# Patient Record
Sex: Male | Born: 1960 | Race: White | Hispanic: No | Marital: Married | Smoking: Never smoker
Health system: Southern US, Community
[De-identification: ages and names within clinical notes are randomized; demographics above are authoritative.]

## PROBLEM LIST (undated history)

## (undated) ENCOUNTER — Inpatient Hospital Stay: Admission: EM | Payer: Self-pay | Source: Home / Self Care

## (undated) DIAGNOSIS — K7469 Other cirrhosis of liver: Secondary | ICD-10-CM

## (undated) DIAGNOSIS — E119 Type 2 diabetes mellitus without complications: Secondary | ICD-10-CM

## (undated) DIAGNOSIS — R161 Splenomegaly, not elsewhere classified: Secondary | ICD-10-CM

## (undated) DIAGNOSIS — H40033 Anatomical narrow angle, bilateral: Secondary | ICD-10-CM

## (undated) DIAGNOSIS — D696 Thrombocytopenia, unspecified: Secondary | ICD-10-CM

## (undated) DIAGNOSIS — K219 Gastro-esophageal reflux disease without esophagitis: Secondary | ICD-10-CM

## (undated) DIAGNOSIS — K900 Celiac disease: Secondary | ICD-10-CM

## (undated) DIAGNOSIS — E785 Hyperlipidemia, unspecified: Secondary | ICD-10-CM

## (undated) DIAGNOSIS — J45909 Unspecified asthma, uncomplicated: Secondary | ICD-10-CM

## (undated) DIAGNOSIS — T7840XA Allergy, unspecified, initial encounter: Secondary | ICD-10-CM

## (undated) DIAGNOSIS — I10 Essential (primary) hypertension: Secondary | ICD-10-CM

## (undated) DIAGNOSIS — F4321 Adjustment disorder with depressed mood: Secondary | ICD-10-CM

## (undated) DIAGNOSIS — N281 Cyst of kidney, acquired: Secondary | ICD-10-CM

## (undated) DIAGNOSIS — D649 Anemia, unspecified: Secondary | ICD-10-CM

## (undated) HISTORY — PX: BIOPSY: SHX5522

## (undated) HISTORY — DX: Celiac disease: K90.0

## (undated) HISTORY — DX: Hyperlipidemia, unspecified: E78.5

## (undated) HISTORY — PX: GOLD SEED IMPLANT: SHX6343

## (undated) HISTORY — PX: ESOPHAGOGASTRODUODENOSCOPY: SHX1529

## (undated) HISTORY — DX: Other cirrhosis of liver: K74.69

## (undated) HISTORY — DX: Type 2 diabetes mellitus without complications: E11.9

## (undated) HISTORY — PX: ESOPHAGOGASTRODUODENOSCOPY (EGD) WITH PROPOFOL: SHX5813

## (undated) HISTORY — DX: Unspecified asthma, uncomplicated: J45.909

## (undated) HISTORY — PX: CLOSED MANIPULATION SHOULDER: SUR205

## (undated) HISTORY — DX: Splenomegaly, not elsewhere classified: R16.1

## (undated) HISTORY — DX: Thrombocytopenia, unspecified: D69.6

## (undated) HISTORY — PX: COLONOSCOPY WITH PROPOFOL: SHX5780

## (undated) HISTORY — PX: COLONOSCOPY: SHX174

## (undated) HISTORY — DX: Gastro-esophageal reflux disease without esophagitis: K21.9

## (undated) HISTORY — DX: Cyst of kidney, acquired: N28.1

## (undated) HISTORY — PX: SPACE OAR INSTILLATION: SHX6769

## (undated) HISTORY — PX: ESOPHAGEAL BANDING: SHX5518

## (undated) HISTORY — DX: Allergy, unspecified, initial encounter: T78.40XA

## (undated) HISTORY — DX: Adjustment disorder with depressed mood: F43.21

## (undated) HISTORY — PX: ESOPHAGOGASTRODUODENOSCOPY: SHX5428

---

## 2005-03-02 ENCOUNTER — Other Ambulatory Visit: Admission: RE | Admit: 2005-03-02 | Discharge: 2005-03-02 | Payer: Self-pay | Admitting: Dermatology

## 2015-11-03 HISTORY — PX: COLONOSCOPY: SHX174

## 2016-03-06 ENCOUNTER — Encounter: Payer: Self-pay | Admitting: Gastroenterology

## 2016-03-25 ENCOUNTER — Ambulatory Visit: Payer: Self-pay | Admitting: Gastroenterology

## 2016-04-07 ENCOUNTER — Encounter: Payer: Self-pay | Admitting: Gastroenterology

## 2016-04-07 ENCOUNTER — Ambulatory Visit (INDEPENDENT_AMBULATORY_CARE_PROVIDER_SITE_OTHER): Payer: BLUE CROSS/BLUE SHIELD | Admitting: Gastroenterology

## 2016-04-07 ENCOUNTER — Encounter (INDEPENDENT_AMBULATORY_CARE_PROVIDER_SITE_OTHER): Payer: Self-pay

## 2016-04-07 VITALS — BP 108/62 | HR 65 | Temp 97.1°F | Ht 67.0 in | Wt 206.0 lb

## 2016-04-07 DIAGNOSIS — D696 Thrombocytopenia, unspecified: Secondary | ICD-10-CM | POA: Diagnosis not present

## 2016-04-07 DIAGNOSIS — Z86010 Personal history of colonic polyps: Secondary | ICD-10-CM

## 2016-04-07 DIAGNOSIS — K900 Celiac disease: Secondary | ICD-10-CM | POA: Insufficient documentation

## 2016-04-07 NOTE — Patient Instructions (Signed)
1. We will contact you with further recommendations once your records have been reviewed.

## 2016-04-07 NOTE — Assessment & Plan Note (Addendum)
55 year old gentleman transferring care to our practice from [facility] gastroenterology. Recently diagnosed with celiac disease per patient. Has been on gluten-free diet for 6 months with resolution of diarrhea and abdominal cramping. Notably patient lost 100 pounds over the course of the year and a half. This was unintentional. Associated with severe diarrhea. Recent colonoscopy with cecal biopsies negative for microscopic colitis, ascending colon biopsy with adenomatous colon polyps. He has a history of previous colonoscopies with adenomatous colon polyps by Dr. West Carbo who has now retired. Patient's celiac disease HLA testing revealed DQ 2 positive/DQ 8 negative. Patient was offered upper endoscopy with small bowel biopsy by Dr. Posey Pronto but patient transferred care here. In the interim he started a gluten-free diet for the past 6 weeks.  Patient needs to the celiac serologies, discussed that they may be difficult to interpret in the setting of recent initiation of gluten-free diet. May ultimately need gluten challenge to confirm diagnosis. Obtain baseline bone density study. Patient also tells me he has a history of thrombocytopenia with mild splenomegaly followed by hematology. We have requested those records. Patient denies any history of advanced liver disease.  Next colonoscopy due in January 2022 for h/o adenomatous colon polyps.

## 2016-04-07 NOTE — Progress Notes (Addendum)
Primary Care Physician:  ADDIS,DANIEL, DO  Primary Gastroenterologist:  Garfield Cornea, MD   Chief Complaint  Patient presents with  . Diarrhea    HPI:  Michael Harmon is a 55 y.o. male here At the request of Dr. Marveen Reeks for further evaluation of chronic diarrhea. Patient requests Dr. [name] because a friend of his sees Dr. [name].  Patient presents with complaints of chronic diarrhea, 100 pound weight loss. At time of his office visit I did not have his records from Dr. Serita Grit office but we have since requested and they are outlined here.  Patient had colonoscopy with Dr. Posey Pronto 11/2015. Ascending colon polyp removed which was adenomatous. Cecal biopsies negative for microscopic colitis. Patient had celiac disease HLA DQ testing and was positive for DQ 2. Celiac disease risk 2.9%  Patient states that he was so disturbed by the endoscopy set up a [facility] that he would never go back. He described it as "third world". After his colonoscopy he states he developed lung infection that required 3 rounds of antibiotics and steroids over the course of 6 months to finally cleared up.  Patient states that he has had diarrhea for nearly 2 years. Over that time frame he's lost over 100 pounds which is been unintentional. He initially contributed to stress. He was taking care of his father-in-law who had stroke and since has passed away. Also lost his mother-in-law and his mother during this time period. He also takes care of his disabled wife and works a full-time job. Typically would have 4-5 Bristol stool 7 stools daily with some nocturnal diarrhea. Utilize frequent Imodium to control symptoms. Was on metformin at one time and thought this was the cause of his diarrhea so eventually stopped it but his symptoms continued. Since he has lost 100 pounds, he has reduced his diabetes medications down to Amaryl half tablet at bedtime. He is no longer on cholesterol medications either. Diabetes  is well controlled. Denies melena or rectal bleeding. No heartburn or indigestion. Was having abdominal cramps.  Patient also has a history of mild thrombocytopenia, has been evaluated by hematology in [place]. States he has a mildly enlarged spleen. They contributed to his thrombocytopenia to Tri[employer] and/or gliperamide. Currently be monitored. States that his liver was "okay". Last labs in March 2017.  Patient states he wants to switch care to [place] because he was not satisfied with the care he received in the interval. He was seen at least 3 times in their office. States he had to wait in an exam room for over 2 hours every time and then was given no significant time by the doctor. He states that he was told he had celiac disease based on blood work and told to go home and look it up on the Internet. They did plan to do EGD with small bowel biopsy that patient decided to transfer his care here. For the past 6 weeks he has tried to maintain a gluten-free diet. Since this change she has noted remarkable improvement in his stools. Now having bowel movement twice per day, Bristol stool 4-5. Abdominal cramping has resolved.    Current Outpatient Prescriptions  Medication Sig Dispense Refill  . benazepril (LOTENSIN) 20 MG tablet     . glimepiride (AMARYL) 4 MG tablet 2 mg at bedtime.     . VENTOLIN HFA 108 (90 Base) MCG/ACT inhaler INHALE 2 PUFFS BY MOUTH EVERY 6 HOURS AS NEEDED FOR WHEEZING  0   No current facility-administered  medications for this visit.    Allergies as of 04/07/2016 - Review Complete 04/07/2016  Allergen Reaction Noted  . Penicillins Other (See Comments) 04/07/2016    Past Medical History  Diagnosis Date  . DM (diabetes mellitus) ([place])   . Hyperlipidemia     diet controlled now with 100 pound weight loss  . Asthma   . Thrombocytopenia ([place])     hematology, ?related to chol med (tricor) and glimeripide, just being monitored, above 100,000    Past Surgical History   Procedure Laterality Date  . None    . Colonoscopy  11/2015    Dr. Posey Pronto: cecal bx neg for microscopic colitis. ascending colon polyp (adenomatous)    Family History  Problem Relation Age of Onset  . Other Mother     chronic diarrhea  . Liver disease Neg Hx   . Colon cancer Neg Hx     Social History   Social History  . Marital Status: Single    Spouse Name: N/A  . Number of Children: 0  . Years of Education: N/A   Occupational History  . Customer service manager, [place], travels    Social History Main Topics  . Smoking status: Never Smoker   . Smokeless tobacco: Not on file  . Alcohol Use: 0.0 oz/week    0 Standard drinks or equivalent per week     Comment: rare, 1-2 drinks a month, on vacation  . Drug Use: No  . Sexual Activity: Not on file   Other Topics Concern  . Not on file   Social History Narrative  . No narrative on file      ROS:  General: Negative for anorexia, weight loss, fever, chills, fatigue, weakness. Eyes: Negative for vision changes.  ENT: Negative for hoarseness, difficulty swallowing , nasal congestion. CV: Negative for chest pain, angina, palpitations, dyspnea on exertion, peripheral edema.  Respiratory: Negative for dyspnea at rest, dyspnea on exertion, cough, sputum, wheezing.  GI: See history of present illness. GU:  Negative for dysuria, hematuria, urinary incontinence, urinary frequency, nocturnal urination.  MS: Negative for joint pain, low back pain.  Derm: Negative for rash or itching.  Neuro: Negative for weakness, abnormal sensation, seizure, frequent headaches, memory loss, confusion.  Psych: Negative for anxiety, depression, suicidal ideation, hallucinations.  Endo: Negative for unusual weight change.  Heme: Negative for bruising or bleeding. Allergy: Negative for rash or hives.    Physical Examination:  BP 108/62 mmHg  Pulse 65  Temp(Src) 97.1 F (36.2 C)  Ht 5' 7"  (1.702 m)  Wt 206 lb (93.441 kg)  BMI  32.26 kg/m2   General: Well-nourished, well-developed in no acute distress.  Head: Normocephalic, atraumatic.   Eyes: Conjunctiva pink, no icterus. Mouth: Oropharyngeal mucosa moist and pink , no lesions erythema or exudate. Neck: Supple without thyromegaly, masses, or lymphadenopathy.  Lungs: Clear to auscultation bilaterally.  Heart: Regular rate and rhythm, no murmurs rubs or gallops.  Abdomen: Bowel sounds are normal, nontender, nondistended, no hepatosplenomegaly or masses, no abdominal bruits or    hernia , no rebound or guarding.   Rectal: not performed Extremities: No lower extremity edema. No clubbing or deformities.  Neuro: Alert and oriented x 4 , grossly normal neurologically.  Skin: Warm and dry, no rash or jaundice.   Psych: Alert and cooperative, normal mood and affect.  Labs: Celiac HLA DQ assoc: DQ2 positive, DQ8 negative  Imaging Studies: No results found.

## 2016-04-08 NOTE — Progress Notes (Signed)
cc'ed to pcp °

## 2016-04-17 ENCOUNTER — Encounter: Payer: Self-pay | Admitting: Gastroenterology

## 2016-05-18 NOTE — Progress Notes (Signed)
Tried to call patient. Left message for return call.  I have reviewed all of his records from Dr. Serita Grit office.   Patient needs further labs done now. He will likely need baseline bone density testing but let's get blood work first. The labs Dr. Posey Pronto did on patient show that he has the genetic potential for celiac disease but does not show the disease for sure. I SUSPECT HE DOES HAVE IT SINCE HE HAS IMPROVED DRAMATICALLY ON GLUTEN FREE DIET.   We need to obtain labs now and then further recommendations to follow.

## 2016-05-18 NOTE — Progress Notes (Signed)
Reviewed records from outside source: Abdominal ultrasound March 2017 showing fatty liver, spleen slightly enlarged 17.7 cm.

## 2016-05-19 NOTE — Progress Notes (Signed)
Pt came by the office. He is aware of recommendations and will have labs drawn. I have given him the order and we went over gluten free diet and went over medications that are gluten free.

## 2016-05-20 LAB — CBC WITH DIFFERENTIAL/PLATELET
BASOS ABS: 0 {cells}/uL (ref 0–200)
Basophils Relative: 0 %
EOS ABS: 235 {cells}/uL (ref 15–500)
EOS PCT: 5 %
HCT: 38.4 % — ABNORMAL LOW (ref 38.5–50.0)
HEMOGLOBIN: 12.9 g/dL — AB (ref 13.2–17.1)
LYMPHS ABS: 1269 {cells}/uL (ref 850–3900)
Lymphocytes Relative: 27 %
MCH: 30.7 pg (ref 27.0–33.0)
MCHC: 33.6 g/dL (ref 32.0–36.0)
MCV: 91.4 fL (ref 80.0–100.0)
MONOS PCT: 9 %
MPV: 13.2 fL — ABNORMAL HIGH (ref 7.5–12.5)
Monocytes Absolute: 423 cells/uL (ref 200–950)
NEUTROS ABS: 2773 {cells}/uL (ref 1500–7800)
NEUTROS PCT: 59 %
Platelets: 127 10*3/uL — ABNORMAL LOW (ref 140–400)
RBC: 4.2 MIL/uL (ref 4.20–5.80)
RDW: 14.6 % (ref 11.0–15.0)
WBC: 4.7 10*3/uL (ref 3.8–10.8)

## 2016-05-20 LAB — IGA: IGA: 265 mg/dL (ref 81–463)

## 2016-05-20 LAB — COMPREHENSIVE METABOLIC PANEL
ALK PHOS: 157 U/L — AB (ref 40–115)
ALT: 61 U/L — AB (ref 9–46)
AST: 62 U/L — AB (ref 10–35)
Albumin: 4 g/dL (ref 3.6–5.1)
BILIRUBIN TOTAL: 0.4 mg/dL (ref 0.2–1.2)
BUN: 23 mg/dL (ref 7–25)
CO2: 19 mmol/L — AB (ref 20–31)
Calcium: 8.7 mg/dL (ref 8.6–10.3)
Chloride: 108 mmol/L (ref 98–110)
Creat: 1.07 mg/dL (ref 0.70–1.33)
GLUCOSE: 145 mg/dL — AB (ref 65–99)
Potassium: 5.7 mmol/L — ABNORMAL HIGH (ref 3.5–5.3)
Sodium: 137 mmol/L (ref 135–146)
Total Protein: 7 g/dL (ref 6.1–8.1)

## 2016-05-20 LAB — PROTIME-INR
INR: 1
PROTHROMBIN TIME: 10.6 s (ref 9.0–11.5)

## 2016-05-20 LAB — GLIADIN ANTIBODIES, SERUM
GLIADIN IGA: 146 U — AB (ref <20)
GLIADIN IGG: 154 U — AB (ref <20)

## 2016-05-20 LAB — IRON: Iron: 60 ug/dL (ref 50–180)

## 2016-05-20 LAB — FOLATE RBC: RBC FOLATE: 722 ng/mL (ref >280)

## 2016-05-20 LAB — VITAMIN D 25 HYDROXY (VIT D DEFICIENCY, FRACTURES): Vit D, 25-Hydroxy: 27 ng/mL — ABNORMAL LOW (ref 30–100)

## 2016-05-20 LAB — TISSUE TRANSGLUTAMINASE, IGA: TISSUE TRANSGLUTAMINASE AB, IGA: 100 U/mL — AB (ref <4)

## 2016-05-20 LAB — FERRITIN: FERRITIN: 41 ng/mL (ref 20–380)

## 2016-05-20 LAB — VITAMIN B12: Vitamin B-12: 426 pg/mL (ref 200–1100)

## 2016-05-21 LAB — ENDOMYSIAL AB IGA RFLX TITER: Endomysial Screen: POSITIVE — AB

## 2016-05-21 LAB — ENDOMYSIAL AB IGA TITER

## 2016-05-28 ENCOUNTER — Encounter: Payer: Self-pay | Admitting: Internal Medicine

## 2016-06-02 NOTE — Progress Notes (Signed)
Please let patient know that all of his screening labs are positive for celiac. Based on the significantly elevated numbers I suspect he may be getting some gluten exposure in his diet. He is only mildly anemic and LFTs are up some likely due to the celiac disease +/- fatty liver previously seen on u/s.  He has mild vitamin D insufficiency: PLEASE ADD VITAMIN D3 800 UNITS DAILY. He will need EGD with small biopsy for confirmation. He will need to add some gluten (3g daily) into his diet for minimum of 2 weeks prior.  Please get patient back into office to schedule EGD may use urgent so patient doesn't have to consume gluten diet for too long. Await bone density results.

## 2016-06-03 ENCOUNTER — Telehealth: Payer: Self-pay | Admitting: Internal Medicine

## 2016-06-03 NOTE — Telephone Encounter (Signed)
Pt called to say that he was returning a call to JL. I told him that she was not available at the moment. He asked for her to call his cell [phone]

## 2016-06-04 NOTE — Progress Notes (Signed)
APPOINTMENT MADE AND HE IS AWARE OF APPT DATE AND TIME

## 2016-06-04 NOTE — Telephone Encounter (Signed)
Late entry- I spoke with the pt yesterday. See result note.

## 2016-06-11 ENCOUNTER — Ambulatory Visit: Payer: BLUE CROSS/BLUE SHIELD | Admitting: Gastroenterology

## 2016-06-17 ENCOUNTER — Encounter: Payer: Self-pay | Admitting: Gastroenterology

## 2016-06-17 ENCOUNTER — Ambulatory Visit (INDEPENDENT_AMBULATORY_CARE_PROVIDER_SITE_OTHER): Payer: BLUE CROSS/BLUE SHIELD | Admitting: Gastroenterology

## 2016-06-17 ENCOUNTER — Other Ambulatory Visit: Payer: Self-pay

## 2016-06-17 VITALS — BP 108/59 | HR 67 | Temp 97.4°F | Ht 67.0 in | Wt 209.8 lb

## 2016-06-17 DIAGNOSIS — K900 Celiac disease: Secondary | ICD-10-CM

## 2016-06-17 NOTE — Progress Notes (Signed)
Primary Care Physician:  No primary care provider on file.  Primary Gastroenterologist:  Garfield Cornea, MD   Chief Complaint  Patient presents with  . EGD    bloodwork shows anemia    HPI:  Michael Harmon is a 55 y.o. male here to schedule EGD. He has positive screening labs for celiac disease. As noted previously was told he had celiac disease by Dr. Posey Pronto based on genetic profile. He had chronic Diarrhea and 100 pound weight loss which led to his diagnosis. We checked serologies which were indeed positive. Patient had already been on gluten-free diet but apparently has had some exposure in his diet given ongoing positive celiac serologies. In addition he has vitamin D deficiency (currently taking 1600IU vit d daily), abnormal LFTs, very mild normocytic anemia. H/O splenomegaly and thrombocytopenia.  Patient continues to have multiple stools daily, Bristol 5-6. No blood in the stool or melena. Denies abdominal pain. Appetite is good. Has remained on gluten-free diet however notably has been getting some exposure. Probably cross-contamination. Patient eats out a lot. Drives 3007 miles per week with his job. Notes he eats french fries several times a week and wonders if there some cross contamination in the oil.  Current Outpatient Prescriptions  Medication Sig Dispense Refill  . benazepril (LOTENSIN) 20 MG tablet Take 20 mg by mouth daily.     . cholecalciferol (VITAMIN D) 1000 units tablet Take 1,000 Units by mouth daily.    Marland Kitchen glimepiride (AMARYL) 4 MG tablet 2 mg at bedtime.     . multivitamin (ONE-A-DAY MEN'S) TABS tablet Take 1 tablet by mouth daily.     No current facility-administered medications for this visit.     Allergies as of 06/17/2016 - Review Complete 06/17/2016  Allergen Reaction Noted  . Penicillins Other (See Comments) 04/07/2016    Past Medical History:  Diagnosis Date  . Asthma   . DM (diabetes mellitus) ([place])   . Hyperlipidemia    diet controlled now with 100  pound weight loss  . Splenomegaly   . Thrombocytopenia ([place])    hematology, ?related to chol med (tricor) and glimeripide, just being monitored, above 100,000    Past Surgical History:  Procedure Laterality Date  . COLONOSCOPY  11/2015   Dr. Posey Pronto: cecal bx neg for microscopic colitis. ascending colon polyp (adenomatous)    Family History  Problem Relation Age of Onset  . Other Mother     chronic diarrhea  . Liver disease Neg Hx   . Colon cancer Neg Hx     Social History   Social History  . Marital status: Single    Spouse name: N/A  . Number of children: 0  . Years of education: N/A   Occupational History  . Customer service manager, [place], travels    Social History Main Topics  . Smoking status: Never Smoker  . Smokeless tobacco: Not on file  . Alcohol use 0.0 oz/week     Comment: rare, 1-2 drinks a month, on vacation  . Drug use: No  . Sexual activity: Not on file   Other Topics Concern  . Not on file   Social History Narrative  . No narrative on file      ROS:  General: Negative for anorexia, weight loss, fever, chills, fatigue, weakness. Eyes: Negative for vision changes.  ENT: Negative for hoarseness, difficulty swallowing , nasal congestion. CV: Negative for chest pain, angina, palpitations, dyspnea on exertion, peripheral edema.  Respiratory: Negative for dyspnea at rest, dyspnea  on exertion, cough, sputum, wheezing.  GI: See history of present illness. GU:  Negative for dysuria, hematuria, urinary incontinence, urinary frequency, nocturnal urination.  MS: Negative for joint pain, low back pain.  Derm: Negative for rash or itching.  Neuro: Negative for weakness, abnormal sensation, seizure, frequent headaches, memory loss, confusion.  Psych: Negative for anxiety, depression, suicidal ideation, hallucinations.  Endo: Negative for unusual weight change.  Heme: Negative for bruising or bleeding. Allergy: Negative for rash or hives.     Physical Examination:  BP (!) 108/59   Pulse 67   Temp 97.4 F (36.3 C) (Oral)   Ht 5' 7"  (1.702 m)   Wt 209 lb 12.8 oz (95.2 kg)   BMI 32.86 kg/m    General: Well-nourished, well-developed in no acute distress.  Head: Normocephalic, atraumatic.   Eyes: Conjunctiva pink, no icterus. Mouth: Oropharyngeal mucosa moist and pink , no lesions erythema or exudate. Neck: Supple without thyromegaly, masses, or lymphadenopathy.  Lungs: Clear to auscultation bilaterally.  Heart: Regular rate and rhythm, no murmurs rubs or gallops.  Abdomen: Bowel sounds are normal, nontender, nondistended, no hepatosplenomegaly or masses, no abdominal bruits or    hernia , no rebound or guarding.   Rectal: Not performed Extremities: No lower extremity edema. No clubbing or deformities.  Neuro: Alert and oriented x 4 , grossly normal neurologically.  Skin: Warm and dry, no rash or jaundice.   Psych: Alert and cooperative, normal mood and affect.  Labs: Lab Results  Component Value Date   WBC 4.7 05/19/2016   HGB 12.9 (L) 05/19/2016   HCT 38.4 (L) 05/19/2016   MCV 91.4 05/19/2016   PLT 127 (L) 05/19/2016   Lab Results  Component Value Date   ALT 61 (H) 05/19/2016   AST 62 (H) 05/19/2016   ALKPHOS 157 (H) 05/19/2016   BILITOT 0.4 05/19/2016   Lab Results  Component Value Date   CREATININE 1.07 05/19/2016   BUN 23 05/19/2016   NA 137 05/19/2016   K 5.7 (H) 05/19/2016   CL 108 05/19/2016   CO2 19 (L) 05/19/2016   TTG 100    Imaging Studies: No results found.

## 2016-06-17 NOTE — Assessment & Plan Note (Signed)
Celiac disease HLA testing revealed DQ2 positive/DQ8 negative. Celiac screen/serologies were positive (patient reports some gluten-free diet). Recommend EGD with small bowel biopsy with gluten challenge. Patient to consume at least 3 g gluten daily for the next 2 weeks.  When biopsies confirm celiac disease, would also recommend dietary consultation to help patient avoid cross-contamination in his diet.  Baseline bone density study planned. Continue vitamin D supplementation.

## 2016-06-17 NOTE — Patient Instructions (Signed)
1. Upper endoscopy as scheduled. Please see separate instructions. 2. Gluten challenge prior to procedure. Consume 1-2 bread products daily.

## 2016-06-17 NOTE — Progress Notes (Signed)
cc'ed to referring doctor

## 2016-06-24 ENCOUNTER — Ambulatory Visit (HOSPITAL_COMMUNITY)
Admission: RE | Admit: 2016-06-24 | Discharge: 2016-06-24 | Disposition: A | Discharge status: Home / Self Care | Payer: BLUE CROSS/BLUE SHIELD | Source: Ambulatory Visit | Attending: Gastroenterology | Admitting: Gastroenterology

## 2016-06-24 DIAGNOSIS — K900 Celiac disease: Secondary | ICD-10-CM

## 2016-06-24 DIAGNOSIS — K909 Intestinal malabsorption, unspecified: Secondary | ICD-10-CM | POA: Diagnosis present

## 2016-06-24 DIAGNOSIS — E559 Vitamin D deficiency, unspecified: Secondary | ICD-10-CM | POA: Diagnosis present

## 2016-07-01 NOTE — Progress Notes (Signed)
Bone density study normal! Await EGD findings.

## 2016-07-09 ENCOUNTER — Ambulatory Visit (HOSPITAL_COMMUNITY)
Admission: RE | Admit: 2016-07-09 | Discharge: 2016-07-09 | Disposition: A | Discharge status: Home / Self Care | Payer: BLUE CROSS/BLUE SHIELD | Source: Ambulatory Visit | Attending: Internal Medicine | Admitting: Internal Medicine

## 2016-07-09 ENCOUNTER — Encounter (HOSPITAL_COMMUNITY): Payer: Self-pay | Admitting: *Deleted

## 2016-07-09 ENCOUNTER — Encounter (HOSPITAL_COMMUNITY)
Admission: RE | Disposition: A | Discharge status: Home / Self Care | Payer: Self-pay | Source: Ambulatory Visit | Attending: Internal Medicine

## 2016-07-09 DIAGNOSIS — R7989 Other specified abnormal findings of blood chemistry: Secondary | ICD-10-CM | POA: Insufficient documentation

## 2016-07-09 DIAGNOSIS — K900 Celiac disease: Secondary | ICD-10-CM | POA: Insufficient documentation

## 2016-07-09 DIAGNOSIS — E559 Vitamin D deficiency, unspecified: Secondary | ICD-10-CM | POA: Insufficient documentation

## 2016-07-09 DIAGNOSIS — Z7984 Long term (current) use of oral hypoglycemic drugs: Secondary | ICD-10-CM | POA: Insufficient documentation

## 2016-07-09 DIAGNOSIS — Z79899 Other long term (current) drug therapy: Secondary | ICD-10-CM | POA: Diagnosis not present

## 2016-07-09 DIAGNOSIS — K2980 Duodenitis without bleeding: Secondary | ICD-10-CM | POA: Insufficient documentation

## 2016-07-09 DIAGNOSIS — R768 Other specified abnormal immunological findings in serum: Secondary | ICD-10-CM

## 2016-07-09 DIAGNOSIS — I8500 Esophageal varices without bleeding: Secondary | ICD-10-CM | POA: Insufficient documentation

## 2016-07-09 DIAGNOSIS — D649 Anemia, unspecified: Secondary | ICD-10-CM | POA: Diagnosis present

## 2016-07-09 DIAGNOSIS — E119 Type 2 diabetes mellitus without complications: Secondary | ICD-10-CM | POA: Insufficient documentation

## 2016-07-09 DIAGNOSIS — R161 Splenomegaly, not elsewhere classified: Secondary | ICD-10-CM | POA: Insufficient documentation

## 2016-07-09 DIAGNOSIS — D696 Thrombocytopenia, unspecified: Secondary | ICD-10-CM | POA: Diagnosis not present

## 2016-07-09 DIAGNOSIS — K209 Esophagitis, unspecified: Secondary | ICD-10-CM | POA: Insufficient documentation

## 2016-07-09 HISTORY — PX: ESOPHAGOGASTRODUODENOSCOPY: SHX5428

## 2016-07-09 HISTORY — PX: BIOPSY: SHX5522

## 2016-07-09 HISTORY — DX: Essential (primary) hypertension: I10

## 2016-07-09 HISTORY — DX: Anemia, unspecified: D64.9

## 2016-07-09 LAB — GLUCOSE, CAPILLARY: GLUCOSE-CAPILLARY: 99 mg/dL (ref 65–99)

## 2016-07-09 SURGERY — EGD (ESOPHAGOGASTRODUODENOSCOPY)
Anesthesia: Moderate Sedation

## 2016-07-09 MED ORDER — MIDAZOLAM HCL 5 MG/5ML IJ SOLN
INTRAMUSCULAR | Status: AC
  Filled 2016-07-09: qty 10

## 2016-07-09 MED ORDER — STERILE WATER FOR IRRIGATION IR SOLN
Status: DC | PRN
  Administered 2016-07-09: 08:00:00

## 2016-07-09 MED ORDER — SODIUM CHLORIDE 0.9 % IV SOLN
INTRAVENOUS | Status: DC
  Administered 2016-07-09: 1000 mL via INTRAVENOUS

## 2016-07-09 MED ORDER — MIDAZOLAM HCL 5 MG/5ML IJ SOLN
INTRAMUSCULAR | Status: DC | PRN
  Administered 2016-07-09: 1 mg via INTRAVENOUS
  Administered 2016-07-09 (×2): 2 mg via INTRAVENOUS
  Administered 2016-07-09: 1 mg via INTRAVENOUS

## 2016-07-09 MED ORDER — MEPERIDINE HCL 100 MG/ML IJ SOLN
INTRAMUSCULAR | Status: AC
  Filled 2016-07-09: qty 2

## 2016-07-09 MED ORDER — MEPERIDINE HCL 100 MG/ML IJ SOLN
INTRAMUSCULAR | Status: DC | PRN
  Administered 2016-07-09 (×2): 25 mg via INTRAVENOUS
  Administered 2016-07-09: 50 mg via INTRAVENOUS

## 2016-07-09 MED ORDER — ONDANSETRON HCL 4 MG/2ML IJ SOLN
INTRAMUSCULAR | Status: DC | PRN
  Administered 2016-07-09: 4 mg via INTRAVENOUS

## 2016-07-09 MED ORDER — LIDOCAINE VISCOUS 2 % MT SOLN
OROMUCOSAL | Status: DC | PRN
  Administered 2016-07-09: 3 mL via OROMUCOSAL

## 2016-07-09 MED ORDER — LIDOCAINE VISCOUS 2 % MT SOLN
OROMUCOSAL | Status: AC
  Filled 2016-07-09: qty 15

## 2016-07-09 MED ORDER — ONDANSETRON HCL 4 MG/2ML IJ SOLN
INTRAMUSCULAR | Status: AC
  Filled 2016-07-09: qty 2

## 2016-07-09 NOTE — H&P (View-Only) (Signed)
Primary Care Physician:  No primary care provider on file.  Primary Gastroenterologist:  Garfield Cornea, MD   Chief Complaint  Patient presents with  . EGD    bloodwork shows anemia    HPI:  Michael Harmon is a 55 y.o. male here to schedule EGD. He has positive screening labs for celiac disease. As noted previously was told he had celiac disease by Dr. Posey Pronto based on genetic profile. He had chronic Diarrhea and 100 pound weight loss which led to his diagnosis. We checked serologies which were indeed positive. Patient had already been on gluten-free diet but apparently has had some exposure in his diet given ongoing positive celiac serologies. In addition he has vitamin D deficiency (currently taking 1600IU vit d daily), abnormal LFTs, very mild normocytic anemia. H/O splenomegaly and thrombocytopenia.  Patient continues to have multiple stools daily, Bristol 5-6. No blood in the stool or melena. Denies abdominal pain. Appetite is good. Has remained on gluten-free diet however notably has been getting some exposure. Probably cross-contamination. Patient eats out a lot. Drives 0973 miles per week with his job. Notes he eats french fries several times a week and wonders if there some cross contamination in the oil.  Current Outpatient Prescriptions  Medication Sig Dispense Refill  . benazepril (LOTENSIN) 20 MG tablet Take 20 mg by mouth daily.     . cholecalciferol (VITAMIN D) 1000 units tablet Take 1,000 Units by mouth daily.    Marland Kitchen glimepiride (AMARYL) 4 MG tablet 2 mg at bedtime.     . multivitamin (ONE-A-DAY MEN'S) TABS tablet Take 1 tablet by mouth daily.     No current facility-administered medications for this visit.     Allergies as of 06/17/2016 - Review Complete 06/17/2016  Allergen Reaction Noted  . Penicillins Other (See Comments) 04/07/2016    Past Medical History:  Diagnosis Date  . Asthma   . DM (diabetes mellitus) ([place])   . Hyperlipidemia    diet controlled now with 100  pound weight loss  . Splenomegaly   . Thrombocytopenia ([place])    hematology, ?related to chol med (tricor) and glimeripide, just being monitored, above 100,000    Past Surgical History:  Procedure Laterality Date  . COLONOSCOPY  11/2015   Dr. Posey Pronto: cecal bx neg for microscopic colitis. ascending colon polyp (adenomatous)    Family History  Problem Relation Age of Onset  . Other Mother     chronic diarrhea  . Liver disease Neg Hx   . Colon cancer Neg Hx     Social History   Social History  . Marital status: Single    Spouse name: N/A  . Number of children: 0  . Years of education: N/A   Occupational History  . Customer service manager, [place], travels    Social History Main Topics  . Smoking status: Never Smoker  . Smokeless tobacco: Not on file  . Alcohol use 0.0 oz/week     Comment: rare, 1-2 drinks a month, on vacation  . Drug use: No  . Sexual activity: Not on file   Other Topics Concern  . Not on file   Social History Narrative  . No narrative on file      ROS:  General: Negative for anorexia, weight loss, fever, chills, fatigue, weakness. Eyes: Negative for vision changes.  ENT: Negative for hoarseness, difficulty swallowing , nasal congestion. CV: Negative for chest pain, angina, palpitations, dyspnea on exertion, peripheral edema.  Respiratory: Negative for dyspnea at rest, dyspnea  on exertion, cough, sputum, wheezing.  GI: See history of present illness. GU:  Negative for dysuria, hematuria, urinary incontinence, urinary frequency, nocturnal urination.  MS: Negative for joint pain, low back pain.  Derm: Negative for rash or itching.  Neuro: Negative for weakness, abnormal sensation, seizure, frequent headaches, memory loss, confusion.  Psych: Negative for anxiety, depression, suicidal ideation, hallucinations.  Endo: Negative for unusual weight change.  Heme: Negative for bruising or bleeding. Allergy: Negative for rash or hives.     Physical Examination:  BP (!) 108/59   Pulse 67   Temp 97.4 F (36.3 C) (Oral)   Ht 5' 7"  (1.702 m)   Wt 209 lb 12.8 oz (95.2 kg)   BMI 32.86 kg/m    General: Well-nourished, well-developed in no acute distress.  Head: Normocephalic, atraumatic.   Eyes: Conjunctiva pink, no icterus. Mouth: Oropharyngeal mucosa moist and pink , no lesions erythema or exudate. Neck: Supple without thyromegaly, masses, or lymphadenopathy.  Lungs: Clear to auscultation bilaterally.  Heart: Regular rate and rhythm, no murmurs rubs or gallops.  Abdomen: Bowel sounds are normal, nontender, nondistended, no hepatosplenomegaly or masses, no abdominal bruits or    hernia , no rebound or guarding.   Rectal: Not performed Extremities: No lower extremity edema. No clubbing or deformities.  Neuro: Alert and oriented x 4 , grossly normal neurologically.  Skin: Warm and dry, no rash or jaundice.   Psych: Alert and cooperative, normal mood and affect.  Labs: Lab Results  Component Value Date   WBC 4.7 05/19/2016   HGB 12.9 (L) 05/19/2016   HCT 38.4 (L) 05/19/2016   MCV 91.4 05/19/2016   PLT 127 (L) 05/19/2016   Lab Results  Component Value Date   ALT 61 (H) 05/19/2016   AST 62 (H) 05/19/2016   ALKPHOS 157 (H) 05/19/2016   BILITOT 0.4 05/19/2016   Lab Results  Component Value Date   CREATININE 1.07 05/19/2016   BUN 23 05/19/2016   NA 137 05/19/2016   K 5.7 (H) 05/19/2016   CL 108 05/19/2016   CO2 19 (L) 05/19/2016   TTG 100    Imaging Studies: No results found.

## 2016-07-09 NOTE — Op Note (Signed)
[facility] Patient Name: Michael Harmon Procedure Date: 07/09/2016 7:13 AM MRN: [id number] Date of Birth: Mar 15, 1961 Attending MD: Norvel Richards , MD CSN: [id number] Age: 55 Admit Type: Outpatient Procedure:                Upper GI endoscopy with biopsy Indications:              Positive celiac serologies Providers:                Norvel Richards, MD, Jeanann Lewandowsky. Gwenlyn Perking RN, RN,                            Randa Spike, Technician Referring MD:              Medicines:                Midazolam 2 mg IV, Meperidine 100 mg IV,                            Ondansetron 4 mg IV Complications:            No immediate complications. Estimated Blood Loss:     Estimated blood loss was minimal. Procedure:                Pre-Anesthesia Assessment:                           - Prior to the procedure, a History and Physical                            was performed, and patient medications and                            allergies were reviewed. The patient's tolerance of                            previous anesthesia was also reviewed. The risks                            and benefits of the procedure and the sedation                            options and risks were discussed with the patient.                            All questions were answered, and informed consent                            was obtained. Prior Anticoagulants: The patient has                            taken no previous anticoagulant or antiplatelet                            agents. ASA Grade Assessment: III - A patient with  severe systemic disease. After reviewing the risks                            and benefits, the patient was deemed in                            satisfactory condition to undergo the procedure.                           After obtaining informed consent, the endoscope was                            passed under direct vision. Throughout the   procedure, the patient's blood pressure, pulse, and                            oxygen saturations were monitored continuously. The                            EG-[id number] ([id number]) scope was introduced through the                            mouth, and advanced to the third part of duodenum.                            The patient tolerated the procedure well. The upper                            GI endoscopy was accomplished without difficulty. Scope In: 7:52:12 AM Scope Out: 8:02:31 AM Total Procedure Duration: 0 hours 10 minutes 19 seconds  Findings:      4 columns of grade 2-3 esophageal varices. No bleeding stigmata. 2       "tongues" of salmn-colored epielium comingfrom the GE junction. No       nodularity. No esophagitis. Stomach emptied No varices. iffuse fish sale       or snakein appearance of the gastric mucosa. Patent pylorus. Examination       of her second third portion of the duodenum revealed some geographic       furrowing/fissuring of the mucosa. minimal scalloping of fold tips.      Biopsies of the distal esophagus, stomach and multiple biopsies of the       duodenum taken. Impression:               - Esophageal varices. Abnormal distal esophageal                            mucosa. Query Barrett's - status post biopsy.                            Portal gastropathy?"status post biopsy. Abnormal                            duodenum consistent with celiac disease ?"status  post biopsy. Today's findings likely indicative of                            underlying cirrhosis This likely goes along way in                            explaining patient's splenomegaly and low platelet                            count. Moderate Sedation:      Moderate (conscious) sedation was administered by the endoscopy nurse       and supervised by the endoscopist. The following parameters were       monitored: oxygen saturation, heart rate, blood pressure, respiratory        rate, EKG, adequacy of pulmonary ventilation, and response to care.       Total physician intraservice time was 19 minutes. Recommendation:           - Patient has a contact number available for                            emergencies. The signs and symptoms of potential                            delayed complications were discussed with the                            patient. Return to normal activities tomorrow.                            Written discharge instructions were provided to the                            patient.                           - Advance diet as tolerated.                           - Continue present medications.                           - Await pathology results.                           - No repeat upper endoscopy.                           - Return to GI office in 4 weeks. Patient needs                            further workup of underlying liver disease. I                            anticipate referral to a nutritionist for  gluten-free diet in the near future. Procedure Code(s):        --- Professional ---                           [phone], Esophagogastroduodenoscopy, flexible,                            transoral; diagnostic, including collection of                            specimen(s) by brushing or washing, when performed                            (separate procedure)                           99152, Moderate sedation services provided by the                            same physician or other qualified health care                            professional performing the diagnostic or                            therapeutic service that the sedation supports,                            requiring the presence of an independent trained                            observer to assist in the monitoring of the                            patient's level of consciousness and physiological                            status; initial 15  minutes of intraservice time,                            patient age 21 years or older Diagnosis Code(s):        --- Professional ---                           R76.8, Other specified abnormal immunological                            findings in serum CPT copyright 2016 American Medical Association. All rights reserved. The codes documented in this report are preliminary and upon coder review may  be revised to meet current compliance requirements. [name]. Jakai Onofre, MD Norvel Richards, MD 07/09/2016 8:23:30 AM This report has been signed electronically. Number of Addenda: 0

## 2016-07-09 NOTE — Discharge Instructions (Addendum)
EGD Discharge instructions Please read the instructions outlined below and refer to this sheet in the next few weeks. These discharge instructions provide you with general information on caring for yourself after you leave the hospital. Your doctor may also give you specific instructions. While your treatment has been planned according to the most current medical practices available, unavoidable complications occasionally occur. If you have any problems or questions after discharge, please call your doctor. ACTIVITY  You may resume your regular activity but move at a slower pace for the next 24 hours.   Take frequent rest periods for the next 24 hours.   Walking will help expel (get rid of) the air and reduce the bloated feeling in your abdomen.   No driving for 24 hours (because of the anesthesia (medicine) used during the test).   You may shower.   Do not sign any important legal documents or operate any machinery for 24 hours (because of the anesthesia used during the test).  NUTRITION  Drink plenty of fluids.   You may resume your normal diet.   Begin with a light meal and progress to your normal diet.   Avoid alcoholic beverages for 24 hours or as instructed by your caregiver.  MEDICATIONS  You may resume your normal medications unless your caregiver tells you otherwise.  WHAT YOU CAN EXPECT TODAY  You may experience abdominal discomfort such as a feeling of fullness or gas pains.  FOLLOW-UP  Your doctor will discuss the results of your test with you.  SEEK IMMEDIATE MEDICAL ATTENTION IF ANY OF THE FOLLOWING OCCUR:  Excessive nausea (feeling sick to your stomach) and/or vomiting.   Severe abdominal pain and distention (swelling).   Trouble swallowing.   Temperature over 101 F (37.8 C).   Rectal bleeding or vomiting of blood.     Further recommendations to follow pending review of pathology report  Office visit with [place] in 4-6 weeks

## 2016-07-09 NOTE — Interval H&P Note (Signed)
History and Physical Interval Note:  07/09/2016 7:41 AM  Michael Harmon  has presented today for surgery, with the diagnosis of positive celiac blood work/sb biopsies  The various methods of treatment have been discussed with the patient and family. After consideration of risks, benefits and other options for treatment, the patient has consented to  Procedure(s) with comments: ESOPHAGOGASTRODUODENOSCOPY (EGD) (N/A) - 730 as a surgical intervention .  The patient's history has been reviewed, patient examined, no change in status, stable for surgery.  I have reviewed the patient's chart and labs.  Questions were answered to the patient's satisfaction.     Robert [name]  No change. EGD with small bowel biopsy as feasible/appropriate per plan.  The risks, benefits, limitations, alternatives and imponderables have been reviewed with the patient. Potential for esophageal dilation, biopsy, etc. have also been reviewed.  Questions have been answered. All parties agreeable.

## 2016-07-10 ENCOUNTER — Encounter: Payer: Self-pay | Admitting: Internal Medicine

## 2016-07-13 ENCOUNTER — Other Ambulatory Visit: Payer: Self-pay

## 2016-07-13 ENCOUNTER — Telehealth: Payer: Self-pay

## 2016-07-13 DIAGNOSIS — K900 Celiac disease: Secondary | ICD-10-CM

## 2016-07-13 NOTE — Telephone Encounter (Signed)
Letter mailed to the pt. 

## 2016-07-13 NOTE — Telephone Encounter (Signed)
referral has been made

## 2016-07-13 NOTE — Telephone Encounter (Signed)
Per RMR- Send letter to patient.  Send copy of letter with path to referring provider and PCP.  Needs refferral to nutritionist asap: new dx of celiac dz.  Should have office f/u w [place] in4 weeks - multiple gi loose ends

## 2016-07-14 DIAGNOSIS — M2010 Hallux valgus (acquired), unspecified foot: Secondary | ICD-10-CM | POA: Insufficient documentation

## 2016-07-15 ENCOUNTER — Encounter (HOSPITAL_COMMUNITY): Payer: Self-pay | Admitting: Internal Medicine

## 2016-08-06 ENCOUNTER — Encounter: Payer: Self-pay | Admitting: Gastroenterology

## 2016-08-06 ENCOUNTER — Ambulatory Visit (INDEPENDENT_AMBULATORY_CARE_PROVIDER_SITE_OTHER): Payer: BLUE CROSS/BLUE SHIELD | Admitting: Gastroenterology

## 2016-08-06 VITALS — BP 106/62 | HR 57 | Temp 97.6°F | Ht 67.0 in | Wt 213.6 lb

## 2016-08-06 DIAGNOSIS — E559 Vitamin D deficiency, unspecified: Secondary | ICD-10-CM | POA: Insufficient documentation

## 2016-08-06 DIAGNOSIS — K7460 Unspecified cirrhosis of liver: Secondary | ICD-10-CM

## 2016-08-06 DIAGNOSIS — K900 Celiac disease: Secondary | ICD-10-CM | POA: Diagnosis not present

## 2016-08-06 LAB — COMPREHENSIVE METABOLIC PANEL
ALT: 40 U/L (ref 9–46)
AST: 37 U/L — AB (ref 10–35)
Albumin: 4 g/dL (ref 3.6–5.1)
Alkaline Phosphatase: 123 U/L — ABNORMAL HIGH (ref 40–115)
BUN: 18 mg/dL (ref 7–25)
CHLORIDE: 107 mmol/L (ref 98–110)
CO2: 22 mmol/L (ref 20–31)
Calcium: 9.1 mg/dL (ref 8.6–10.3)
Creat: 1.1 mg/dL (ref 0.70–1.33)
Glucose, Bld: 89 mg/dL (ref 65–99)
POTASSIUM: 5.2 mmol/L (ref 3.5–5.3)
Sodium: 138 mmol/L (ref 135–146)
Total Bilirubin: 0.4 mg/dL (ref 0.2–1.2)
Total Protein: 6.9 g/dL (ref 6.1–8.1)

## 2016-08-06 NOTE — Assessment & Plan Note (Signed)
Patient tries to maintain gluten-free diet but has some exposure somewhere in his diet. He is scheduled to see dietitian next week. Baseline bone density test was normal. We'll plan on repeat and a couple of years. He does have vitamin D deficiency as previously documented. Recently went up to 2 thousand international units daily of vitamin D.

## 2016-08-06 NOTE — Patient Instructions (Signed)
1. Please have your labs done. 2. You are due an abdominal ultrasound to screen for liver nodule given your new diagnosis of cirrhosis. If your hematologist does not perform one, please let us know and we will make arrangements. 3. Please avoid alcohol. 4. You should take a daily vitamin that includes vitamin A, D, E, K.  5. Keep upcoming appointment with the dietician.  6. Consider pneumococcal vaccination if your PCP agrees. It is recommended due to your celiac disease.  7. We will be in touch after your results are back.   Cirrhosis Cirrhosis is long-term (chronic) liver injury. The liver is your largest internal organ, and it performs many functions. The liver converts food into energy, removes toxic material from your blood, makes important proteins, and absorbs necessary vitamins from your diet. If you have cirrhosis, it means many of your healthy liver cells have been replaced by scar tissue. This prevents blood from flowing through your liver, which makes it difficult for your liver to function. This scarring is not reversible, but treatment can prevent it from getting worse.  CAUSES  Hepatitis C and long-term alcohol abuse are the most common causes of cirrhosis. Other causes include:  Nonalcoholic fatty liver disease.  Hepatitis B infection.  Autoimmune hepatitis.  Diseases that cause blockage of ducts inside the liver.  Inherited liver diseases.  Reactions to certain long-term medicines.  Parasitic infections.  Long-term exposure to certain toxins. RISK FACTORS You may have a higher risk of cirrhosis if you:  Have certain hepatitis viruses.  Abuse alcohol, especially if you are male.  Are overweight.  Share needles.  Have unprotected sex with someone who has hepatitis. SYMPTOMS  You may not have any signs and symptoms at first. Symptoms may not develop until the damage to your liver starts to get worse. Signs and symptoms of cirrhosis may include:   Tenderness  in the right-upper part of your abdomen.  Weakness and tiredness (fatigue).  Loss of appetite.  Nausea.  Weight loss and muscle loss.  Itchiness.  Yellow skin and eyes (jaundice).  Buildup of fluid in the abdomen (ascites).  Swelling of the feet and ankles (edema).  Appearance of tiny blood vessels under the skin.  Mental confusion.  Easy bruising and bleeding. DIAGNOSIS  Your health care provider may suspect cirrhosis based on your symptoms and medical history, especially if you have other medical conditions or a history of alcohol abuse. Your health care provider will do a physical exam to feel your liver and check for signs of cirrhosis. Your health care provider may perform other tests, including:   Blood tests to check:   Whether you have hepatitis B or C.   Kidney function.  Liver function.  Imaging tests such as:  MRI or CT scan to look for changes seen in advanced cirrhosis.  Ultrasound to see if normal liver tissue is being replaced by scar tissue.  A procedure using a long needle to take a sample of liver tissue (biopsy) for examination under a microscope. Liver biopsy can confirm the diagnosis of cirrhosis.  TREATMENT  Treatment depends on how damaged your liver is and what caused the damage. Treatment may include treating cirrhosis symptoms or treating the underlying causes of the condition to try to slow the progression of the damage. Treatment may include:  Making lifestyle changes, such as:   Eating a healthy diet.  Restricting salt intake.  Maintaining a healthy weight.   Not abusing drugs or alcohol.  Taking medicines  to:  Treat liver infections or other infections.  Control itching.  Reduce fluid buildup.  Reduce certain blood toxins.  Reduce risk of bleeding from enlarged blood vessels in the stomach or esophagus (varices).  If varices are causing bleeding problems, you may need treatment with a procedure that ties up the  vessels causing them to fall off (band ligation).  If cirrhosis is causing your liver to fail, your health care provider may recommend a liver transplant.  Other treatments may be recommended depending on any complications of cirrhosis, such as liver-related kidney failure (hepatorenal syndrome). HOME CARE INSTRUCTIONS   Take medicines only as directed by your health care provider. Do not use drugs that are toxic to your liver. Ask your health care provider before taking any new medicines, including over-the-counter medicines.   Rest as needed.  Eat a well-balanced diet. Ask your health care provider or dietitian for more information.   You may have to follow a low-salt diet or restrict your water intake as directed.  Do not drink alcohol. This is especially important if you are taking acetaminophen.  Keep all follow-up visits as directed by your health care provider. This is important. SEEK MEDICAL CARE IF:  You have fatigue or weakness that is getting worse.  You develop swelling of the hands, feet, legs, or face.  You have a fever.  You develop loss of appetite.  You have nausea or vomiting.  You develop jaundice.  You develop easy bruising or bleeding. SEEK IMMEDIATE MEDICAL CARE IF:  You vomit bright red blood or a material that looks like coffee grounds.  You have blood in your stools.  Your stools appear black and tarry.  You become confused.  You have chest pain or trouble breathing.   This information is not intended to replace advice given to you by your health care provider. Make sure you discuss any questions you have with your health care provider.   Document Released: 10/19/2005 Document Revised: 11/09/2014 Document Reviewed: 06/27/2014 Elsevier Interactive Patient Education [employer].

## 2016-08-06 NOTE — Assessment & Plan Note (Addendum)
Recent diagnosis of cirrhosis at time of endoscopy, evidence of esophageal varices and portal hypertension. Likely goes along when explaining splenomegaly/thrombocytopenia. Ultrasound earlier this year showed mildly enlarged liver with steatosis, mildly enlarged spleen.  Discussed at length with patient. Cirrhosis can be linked with celiac. Also could be related to fatty liver in the setting of obesity, diabetes. 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 Baseline labs need to be obtained, to calculate meld score. Recommend avoiding alcohol. Recommend maintaining tight control of diabetes, gluten-free diet, avoid weight gain, increased physical activity as tolerated. We will check baseline autoimmune markers, PBC as well. Check viral markers and vaccinate as appropriate. Also encouraged patient to talk with his PCP regarding pneumococcal vaccine given history of celiac disease. Abdominal ultrasound in the near future, patient will check with his hematologist to see if they plan on doing one as well and if not we will order it. Further recommendations to follow.  Patient aware of need for hepatoma surveillance and labs every 6 months. We will consider evaluation of transplant center to discuss potential candidacy in the future so that he can correct any disqualifying issues as possible.  To discuss management of esophageal varices with Dr. [name].

## 2016-08-06 NOTE — Progress Notes (Signed)
Primary Care Physician: ADDIS,DANIEL, DO  Primary Gastroenterologist:  Garfield Cornea, MD   Chief Complaint  Patient presents with  . Follow-up    HPI: Michael Harmon is a 55 y.o. male here for follow up of celiac disease and recently diagnosed cirrhosis. EGD with small bowel biopsy confirmed celiac disease back on 07/09/16. He was also noted to have esophageal varices, 4 columns grade 2-3, portal gastropathy. This likely explains his splenomegaly and thrombocytopenia. U/s of liver earlier this year showed fatty liver but no overt changes of cirrhosis.  Patient has had a normal bone density study. He has mild vitamin D deficiency and is on daily supplement. He is scheduled to see the dietitian on Tuesday to help figure out where he is getting cross contamination and exposure to gluten. He is scheduled to see his hematologist next week for follow-up of thrombocytopenia. Advised him to let his hematologist know the patient has cirrhosis.  Clinically he does pretty well. He has intermittent diarrhea especially if he eats something that likely has some gluten. He tries to avoid but when he eats out likely does get some exposure. Even with gluten-free diet he has had positive serologies, abnormal small bowel findings therefore we have stressed the importance of him seeing the dietitian and he is agreeable. He denies any abdominal pain, melena, rectal bleeding. Denies any heartburn, dysphagia. Weight is up 6 pounds since his last office visit. Consumes alcohol about once per week, 3 shots at a time. May go weeks at a time when no alcohol. Does not consume beer.  Given recent findings of cirrhosis, patient tells me that he drank heavily in his 82s. This was for approximately 10 year period of time. Obviously he used to have significant obesity prior to 100 pound weight loss. He also has diabetes. No family history of liver disease. Previous labs show no indication of hemochromatosis. His hepatitis C  antibody was negative done through his PCP.      Current Outpatient Prescriptions  Medication Sig Dispense Refill  . benazepril (LOTENSIN) 20 MG tablet Take 20 mg by mouth daily.     . cholecalciferol (VITAMIN D) 1000 units tablet Take 1,000 Units by mouth daily.    Marland Kitchen glimepiride (AMARYL) 4 MG tablet 1 mg at bedtime.     . multivitamin (ONE-A-DAY MEN'S) TABS tablet Take 1 tablet by mouth daily.     No current facility-administered medications for this visit.     Allergies as of 08/06/2016 - Review Complete 08/06/2016  Allergen Reaction Noted  . Penicillins Other (See Comments) 04/07/2016   Past Medical History:  Diagnosis Date  . Anemia   . Asthma   . DM (diabetes mellitus) ([place])   . Hyperlipidemia    diet controlled now with 100 pound weight loss  . Hypertension   . Splenomegaly   . Thrombocytopenia ([place])    hematology, ?related to chol med (tricor) and glimeripide, just being monitored, above 100,000   Past Surgical History:  Procedure Laterality Date  . BIOPSY  07/09/2016   Procedure: BIOPSY;  Surgeon: [name], MD;  Location: [facility];  Service: Endoscopy;;  duodenal, gastric, esophaageal  . COLONOSCOPY  11/2015   Dr. Posey Pronto: cecal bx neg for microscopic colitis. ascending colon polyp (adenomatous)  . ESOPHAGOGASTRODUODENOSCOPY N/A 07/09/2016   Procedure: ESOPHAGOGASTRODUODENOSCOPY (EGD);  Surgeon: [name], MD;  Location: [facility];  Service: Endoscopy;  Laterality: N/A;  730    ROS:  General: Negative  for anorexia, weight loss, fever, chills, fatigue, weakness. ENT: Negative for hoarseness, difficulty swallowing , nasal congestion. CV: Negative for chest pain, angina, palpitations, dyspnea on exertion, peripheral edema.  Respiratory: Negative for dyspnea at rest, dyspnea on exertion, cough, sputum, wheezing.  GI: See history of present illness. GU:  Negative for dysuria, hematuria, urinary incontinence, urinary frequency, nocturnal urination.    Endo: Negative for unusual weight change.    Physical Examination:   BP 106/62   Pulse (!) 57   Temp 97.6 F (36.4 C) (Oral)   Ht 5' 7"  (1.702 m)   Wt 213 lb 9.6 oz (96.9 kg)   BMI 33.45 kg/m   General: Well-nourished, well-developed in no acute distress.  Eyes: No icterus. Mouth: Oropharyngeal mucosa moist and pink , no lesions erythema or exudate. Lungs: Clear to auscultation bilaterally.  Heart: Regular rate and rhythm, no murmurs rubs or gallops.  Abdomen: Bowel sounds are normal, nontender, nondistended, no hepatosplenomegaly or masses, no abdominal bruits or hernia , no rebound or guarding.   Extremities: No lower extremity edema. No clubbing or deformities. Neuro: Alert and oriented x 4   Skin: Warm and dry, no jaundice.   Psych: Alert and cooperative, normal mood and affect.  Labs:  Labs from 07/08/2016 by PCP Sodium 137, potassium 5.5, BUN 19, creatinine 1.10, total bilirubin 0.5, alkaline phosphatase 153, AST 66, ALT 74, albumin 4, white blood cell count 4100, hemoglobin 13.2, MCV 92.4, platelets 106,000, hemoglobin A1c 5.2 Hepatitis C antibody negative  Imaging Studies: No results found.

## 2016-08-07 LAB — PROTIME-INR
INR: 1
PROTHROMBIN TIME: 10.8 s (ref 9.0–11.5)

## 2016-08-07 LAB — IGG, IGA, IGM
IGG (IMMUNOGLOBIN G), SERUM: 1334 mg/dL (ref 694–1618)
IgA: 271 mg/dL (ref 81–463)
IgM, Serum: 73 mg/dL (ref 48–271)

## 2016-08-07 LAB — ANA: ANA: NEGATIVE

## 2016-08-07 LAB — HEPATITIS B CORE ANTIBODY, TOTAL: Hep B Core Total Ab: NONREACTIVE

## 2016-08-07 LAB — VITAMIN D 25 HYDROXY (VIT D DEFICIENCY, FRACTURES): Vit D, 25-Hydroxy: 29 ng/mL — ABNORMAL LOW (ref 30–100)

## 2016-08-07 LAB — HEPATITIS A ANTIBODY, TOTAL: Hep A Total Ab: NONREACTIVE

## 2016-08-07 LAB — HEPATITIS B SURFACE ANTIBODY,QUALITATIVE: HEP B S AB: NEGATIVE

## 2016-08-07 LAB — HEPATITIS B SURFACE ANTIGEN: HEP B S AG: NEGATIVE

## 2016-08-07 LAB — MITOCHONDRIAL ANTIBODIES

## 2016-08-07 NOTE — Progress Notes (Signed)
CC'ED TO PCP 

## 2016-08-10 LAB — ANTI-SMOOTH MUSCLE ANTIBODY, IGG

## 2016-08-11 ENCOUNTER — Encounter: Payer: BLUE CROSS/BLUE SHIELD | Attending: Internal Medicine | Admitting: Nutrition

## 2016-08-11 ENCOUNTER — Encounter: Payer: Self-pay | Admitting: Nutrition

## 2016-08-11 VITALS — Ht 67.0 in | Wt 209.0 lb

## 2016-08-11 DIAGNOSIS — E119 Type 2 diabetes mellitus without complications: Secondary | ICD-10-CM

## 2016-08-11 DIAGNOSIS — K900 Celiac disease: Secondary | ICD-10-CM

## 2016-08-11 NOTE — Progress Notes (Signed)
Medical Nutrition Therapy:  Appt start time: 1430 end time:  6659.  Assessment:  Primary concerns today: Celiac Disease  Lives with his wife who is blind. He is her caretaker. He still works for [employer]. Works 60+ hours per week. Eats 3 meals per day. He snacks some during the day.  Just got diagnosed a few months ago officially of celiac disease. PMH: Issues with his liver, esophagus varies, low Vit D levels,, HTN, Sees [facility]. PCP Dr. Marveen Reeks in [place]. Has DM Type 2. Takes. 1 mg of Glipiimide but has some low blood sugars at times due to inconsistency of carbs. Lost 100 lbs in the last 2 1/2 years. Has a tremendous amount of stress on him after losing his mother, mother in law and his father being sick now.  A1C 5.2%.       He is dong a good job of reading food labels, and sticking to a gluten free diet as best he can with information he knows about. He is concerned about cross contamination. Prepares most foods at home but eats out a few places that he knows are gluten free; salad or other gluten free items. His bowels are ok right now. Wt stable.      Diet is high in simple sugars even though they are some sweets gluten free. Encouraged to quit out diet sodas, and avoid simple sugars.   Preferred Learning Style:  No preference indicated   Learning Readiness:  Ready  Change in progress  MEDICATIONS:See list   DIETARY INTAKE:.    24-hr recall:  B ( AM): Egg white salad with light mayo,  Apples and tomatoes no dressing,  Diet soad Snk ( AM):  Nuts gluten free trail mix or lance gluten free ritz bits  L ( PM): Half Apple pecan salad from [place]. No dressng  Snk ( PM):gluten free candy D ( PM): BBQ chicken, baked potato and mixed vegetables, Diet soda Snk ( PM): 1 cup ice cream- gluten free Beverages: water. Diet soda  Usual physical activity:  Walks dog and yard work. Walks a lot on his job.  Estimated energy needs: 1800  calories 200 g  carbohydrates 135 g protein 50 g fat  Progress Towards Goal(s):  In progress.   Nutritional Diagnosis:  -1.4 Altered GI function As related to Celiac Disease.  As evidenced by abnormal biopsy in small bowel.   Intervention:  Nutrition Therapy for Celiac Disease, reading food labels, foods to avoid, ways to prevent cross contamination, and ways to prepare foods and meals for improved nutrition and reducing problems with GI issues.   Goals 1. Follow Gluten Free Celiac diet as discussed. 2. Cut out diet sodas 3. Cut out simple sugars and sweets even if they are gluten free for empty calories and potential weight gain and elevated BS or problems with blood sugars. 4. Try to prepare  Gluten free meals and carry with you for work to rely less on fast food options. Eat 2-3 carb choices per meals to prevent hypoglycemia. Continue to read food labels for ingredients that may contain gluten. May choose to look up information from time to time on [employer] website Http://www.glutenfreeresourcedirectory.com/    Teaching Method Utilized:  Visual Auditory Hands on  Handouts given during visit include:  Nutrition Therapy for Celiac Disease  Barriers to learning/adherence to lifestyle change:  None  Demonstrated degree of understanding via:  Teach Back   Monitoring/Evaluation:  Dietary intake, exercise, , and body weight  prn.

## 2016-08-11 NOTE — Patient Instructions (Addendum)
Goals 1. Follow Gluten Free Celiac diet as discussed. 2. Cut out diet sodas 3. Cut out simple sugars and sweets even if they are gluten free for empty calories and potential weight gain and elevated BS or problems with blood sugars. 4. Try to prepare  Gluten free meals and carry with you for work to rely less on fast food options. Eat 2-3 carb choices per meals to prevent hypoglycemia. Continue to read food labels for ingredients that may contain gluten. May choose to look up information from time to time on [employer] website Http://www.glutenfreeresourcedirectory.com/

## 2016-08-13 ENCOUNTER — Telehealth: Payer: Self-pay

## 2016-08-13 DIAGNOSIS — K7460 Unspecified cirrhosis of liver: Secondary | ICD-10-CM

## 2016-08-13 NOTE — Telephone Encounter (Signed)
Pt went to see the hematologist and they did not perform any test, so he is calling tolet [place] know. Please advise

## 2016-08-16 NOTE — Progress Notes (Signed)
LFTs slightly improved. Viral markers negative. Autoimmune and PBC markers negative. Vitamin D improved continue current dose  He needs vaccination for hep A/B. Please arrange.  MELD 7 Child Pugh A Needs abd u/s for hepatoma: see phone note Repeat CBC, PT/INR, CMET, TTG Iga, vit d 25 hydroxy in 6 months,  OV with RMR four months. (celiac and cirrhosis)

## 2016-08-16 NOTE — Telephone Encounter (Signed)
Please schedule for abd u/s for hepatoma screening See lab result note

## 2016-08-16 NOTE — Progress Notes (Signed)
Spoke with Dr. [name] about esophageal variceal management. Would advise nonselective beta blocker, no repeat EGD unless bleeds. Patient with pulse already in low 60s, upper 50s on several OVs. Will reassess at next ov in three months with RMR.

## 2016-08-17 NOTE — Telephone Encounter (Signed)
Pt is set up for [place] on 08/19/16 @ 8:30. He is aware

## 2016-08-17 NOTE — Telephone Encounter (Signed)
Tried to call pt- LM for return call.

## 2016-08-17 NOTE — Telephone Encounter (Signed)
Pt is aware. Please schedule.

## 2016-08-18 NOTE — Progress Notes (Signed)
ON RECALL FOR RMR OV

## 2016-08-19 ENCOUNTER — Ambulatory Visit (HOSPITAL_COMMUNITY)
Admission: RE | Admit: 2016-08-19 | Discharge: 2016-08-19 | Disposition: A | Discharge status: Home / Self Care | Payer: BLUE CROSS/BLUE SHIELD | Source: Ambulatory Visit | Attending: Gastroenterology | Admitting: Gastroenterology

## 2016-08-19 DIAGNOSIS — R161 Splenomegaly, not elsewhere classified: Secondary | ICD-10-CM | POA: Insufficient documentation

## 2016-08-19 DIAGNOSIS — K7460 Unspecified cirrhosis of liver: Secondary | ICD-10-CM | POA: Diagnosis present

## 2016-09-06 NOTE — Progress Notes (Signed)
No hepatoma. Next abd u/s in six months.

## 2016-09-21 ENCOUNTER — Other Ambulatory Visit: Payer: Self-pay | Admitting: Gastroenterology

## 2016-09-21 DIAGNOSIS — K900 Celiac disease: Secondary | ICD-10-CM

## 2016-11-05 ENCOUNTER — Encounter: Payer: Self-pay | Admitting: Internal Medicine

## 2016-11-27 ENCOUNTER — Ambulatory Visit (INDEPENDENT_AMBULATORY_CARE_PROVIDER_SITE_OTHER): Payer: BLUE CROSS/BLUE SHIELD | Admitting: Internal Medicine

## 2016-11-27 ENCOUNTER — Encounter: Payer: Self-pay | Admitting: Internal Medicine

## 2016-11-27 ENCOUNTER — Other Ambulatory Visit: Payer: Self-pay

## 2016-11-27 ENCOUNTER — Telehealth: Payer: Self-pay

## 2016-11-27 VITALS — BP 116/69 | HR 61 | Temp 98.0°F | Ht 67.0 in | Wt 223.8 lb

## 2016-11-27 DIAGNOSIS — K7460 Unspecified cirrhosis of liver: Secondary | ICD-10-CM

## 2016-11-27 DIAGNOSIS — K900 Celiac disease: Secondary | ICD-10-CM | POA: Diagnosis not present

## 2016-11-27 DIAGNOSIS — K7030 Alcoholic cirrhosis of liver without ascites: Secondary | ICD-10-CM | POA: Diagnosis not present

## 2016-11-27 NOTE — Telephone Encounter (Signed)
Referral info faxed to [facility] for liver transplant evaluation.

## 2016-11-27 NOTE — Progress Notes (Signed)
Primary Care Physician:  ADDIS,DANIEL, DO Primary Gastroenterologist:  Dr. [name]  Pre-Procedure History & Physical: HPI:  Michael Harmon is a 56 y.o. male here for follow-up of cirrhosis and celiac disease. He has been adherent to a gluten-free diet the vast majority of the time. Diarrhea has improved. He has gained weight. He is susceptible hepatitis A and B. Known esophageal varices. He is borderline bradycardic today. No beta blocker as of yet.  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 As previously discussed, patient would like liver clinic evaluation down at [facility].  He's in a hepatoma screening program every 6 months.  Past Medical History:  Diagnosis Date  . Anemia   . Asthma   . DM (diabetes mellitus) ([place])   . Hyperlipidemia    diet controlled now with 100 pound weight loss  . Hypertension   . Splenomegaly   . Thrombocytopenia ([place])    hematology, ?related to chol med (tricor) and glimeripide, just being monitored, above 100,000    Past Surgical History:  Procedure Laterality Date  . BIOPSY  07/09/2016   Procedure: BIOPSY;  Surgeon: [name], MD;  [facility];  Service: Endoscopy;;  duodenal, gastric, esophaageal  . COLONOSCOPY  11/2015   Dr. Posey Pronto: cecal bx neg for microscopic colitis. ascending colon polyp (adenomatous)  . ESOPHAGOGASTRODUODENOSCOPY N/A 07/09/2016   Procedure: ESOPHAGOGASTRODUODENOSCOPY (EGD);  Surgeon: [name], MD;  [facility];  Service: Endoscopy;  Laterality: N/A;  730    Prior to Admission medications   Medication Sig Start Date End Date Taking? Authorizing Provider  benazepril (LOTENSIN) 20 MG tablet Take 20 mg by mouth daily.  02/26/16  Yes Historical Provider, MD  cholecalciferol (VITAMIN D) 1000 units tablet Take 2,000 Units by mouth daily.    Yes Historical Provider, MD  glimepiride (AMARYL) 4 MG tablet 1 mg at bedtime.  04/01/16  Yes Historical Provider, MD  multivitamin (ONE-A-DAY  MEN'S) TABS tablet Take 1 tablet by mouth daily.   Yes Historical Provider, MD    Allergies as of 11/27/2016 - Review Complete 11/27/2016  Allergen Reaction Noted  . Penicillins Other (See Comments) 04/07/2016    Family History  Problem Relation Age of Onset  . Other Mother     chronic diarrhea  . Liver disease Neg Hx   . Colon cancer Neg Hx     Social History   Social History  . Marital status: Single    Spouse name: N/A  . Number of children: 0  . Years of education: N/A   Occupational History  . Customer service manager, [place], travels    Social History Main Topics  . Smoking status: Never Smoker  . Smokeless tobacco: Never Used  . Alcohol use 0.0 oz/week     Comment: rare, 1-2 drinks a month, on vacation  . Drug use: No  . Sexual activity: Not on file   Other Topics Concern  . Not on file   Social History Narrative  . No narrative on file    Review of Systems: See HPI, otherwise negative ROS  Physical Exam: BP 116/69   Pulse 61   Temp 98 F (36.7 C) (Oral)   Ht 5' 7"  (1.702 m)   Wt 223 lb 12.8 oz (101.5 kg)   BMI 35.05 kg/m  General:   Alert,  pleasant and cooperative in NAD Neck:  Supple; no masses or thyromegaly. No significant cervical adenopathy. Lungs:  Clear throughout to auscultation.  No wheezes, crackles, or rhonchi. No acute distress. Heart:  Regular rate and rhythm; no murmurs, clicks, rubs,  or gallops. Abdomen: Non-distended, normal bowel sounds.  Soft and nontender without appreciable mass or hepatosplenomegaly.  Pulses:  Normal pulses noted. Extremities:  Without clubbing or edema.  Impression:  Pleasant 56 year old gentleman with the Child's A cirrhosis. Meld 7. Etiology of cirrhosis could easily be NASH or a component of ASH given nearly decade history of heavy alcohol use earlier in life. It's unclear to me whether or not an autoimmune component related to celiac disease could be playing a role. 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  He is susceptible hepatitis A and B and needs to be vaccinated. He has borderline bradycardia at this time therefore, will hold off on the beta blockade.  Diminution in diarrhea and weight gain reassuring.   Recommendations:   Continue on a Gluten free diet  Arrange Hepatitis B and A vaccination  Schedule a referral to [facility] for liver transplant evaluation  Ultrasound every 6 months  Hold off on Beta blocker therapy for now.  Office visit in 4 months   Notice: This dictation was prepared with Dragon dictation along with smaller phrase technology. Any transcriptional errors that result from this process are unintentional and may not be corrected upon review.

## 2016-11-27 NOTE — Patient Instructions (Signed)
Continue on a Gluten free diet  Arrange Hepatitis B and A vaccination  Schedule a referral to [facility] for liver transplant evaluation  Ultrasound every 6 months  Hold off on Beta blocker therapy for now.  Office visit in 4 months

## 2016-12-09 NOTE — Telephone Encounter (Signed)
Michael Harmon at [facility] called this afternoon. She called pt and LMOM for pt to call them to schedule appt for consult.

## 2016-12-15 NOTE — Telephone Encounter (Signed)
Tried to call pt to see if he has appt with [facility], no answer.

## 2016-12-16 ENCOUNTER — Telehealth: Payer: Self-pay | Admitting: Internal Medicine

## 2016-12-16 NOTE — Telephone Encounter (Signed)
Pt was returning a call. Please call [phone]

## 2016-12-16 NOTE — Telephone Encounter (Signed)
Tried to call pt, he wasn't available. Left message with his wife for him to call office.

## 2016-12-17 NOTE — Telephone Encounter (Signed)
Spoke with pt yesterday. He received a call from [place] last week and spoke to someone about when he could schedule an appt. He hasn't heard anything yet about appt being schedule. Gave pt phone number to [facility] and he will call to see if he can schedule an appt. Asked pt to let our office know when he has an appt. He then called back and told SS that he called but they were closed.

## 2016-12-17 NOTE — Telephone Encounter (Signed)
Pt called office and said he has left messages on voicemail at [facility]: Lambert Rhonda W. He is awaiting return call.

## 2016-12-17 NOTE — Telephone Encounter (Signed)
See separate phone note. Pt has a consult appt at [facility] 12/24/16 at 9:20am.

## 2016-12-17 NOTE — Telephone Encounter (Signed)
Received fax from [facility]. Pt has a consult appt 12/24/16 at 9:20am with Dr. Alfonso Patten.

## 2016-12-17 NOTE — Telephone Encounter (Signed)
Letter was attached to fax about consult appt. Letter says at this time we are not able to proceed with evaluation due to the patient's low MELD score of 7. However, we would like to schedule a consult appointment.

## 2017-01-22 ENCOUNTER — Other Ambulatory Visit: Payer: Self-pay

## 2017-01-22 DIAGNOSIS — K900 Celiac disease: Secondary | ICD-10-CM

## 2017-01-26 ENCOUNTER — Telehealth: Payer: Self-pay | Admitting: Internal Medicine

## 2017-01-26 NOTE — Telephone Encounter (Signed)
PATIENT CAME IN STATING THAT HE HAD AN APPT WITH [facility] LAST MONTH AND THE PHYSICIAN  THERE WAS SUPPOSED TO TALK TO DR [name] ABOUT THE NEXT PLAN OF CARE FOR THE PATIENT.  PLEASE CALL THE PATIENT ON HIS CELL PHONE, HE HAS SOME QUESTIONS [phone]

## 2017-01-26 NOTE — Telephone Encounter (Signed)
Spoke with the pt, see other phone note.

## 2017-01-26 NOTE — Telephone Encounter (Signed)
Tried to call pt- NA- LMOM 

## 2017-01-26 NOTE — Telephone Encounter (Signed)
Pt was returning a call from [place]. Please call [phone]

## 2017-01-26 NOTE — Telephone Encounter (Signed)
Spoke with the pt, he was following up on his referral to [facility]. Per RMR note on paperwork received from [place], pt needs EGD in 1 year. Please nic.  Pt is also aware that lab orders were just mailed to him last week.  He is due for an U/S, last one was done in October. He would like to go ahead and schedule it for late April.  He is also aware that he is on the recall for ov in May, please schedule.   Pt said he has received his first Hep A/ hep B vaccine.  Routing to RMR for FYI.

## 2017-01-27 ENCOUNTER — Other Ambulatory Visit: Payer: Self-pay

## 2017-01-27 ENCOUNTER — Encounter: Payer: Self-pay | Admitting: Internal Medicine

## 2017-01-27 DIAGNOSIS — K7460 Unspecified cirrhosis of liver: Secondary | ICD-10-CM

## 2017-01-27 NOTE — Telephone Encounter (Signed)
Pt is set up for 02/25/17 @ 9:30 am. Letter in the mail

## 2017-01-27 NOTE — Telephone Encounter (Signed)
noted 

## 2017-01-27 NOTE — Telephone Encounter (Signed)
APPT MADE AND ON RECALL

## 2017-02-04 LAB — CBC WITH DIFFERENTIAL/PLATELET
BASOS ABS: 0 {cells}/uL (ref 0–200)
Basophils Relative: 0 %
EOS ABS: 215 {cells}/uL (ref 15–500)
Eosinophils Relative: 5 %
HEMATOCRIT: 40.6 % (ref 38.5–50.0)
Hemoglobin: 13.6 g/dL (ref 13.2–17.1)
Lymphocytes Relative: 32 %
Lymphs Abs: 1376 cells/uL (ref 850–3900)
MCH: 30.4 pg (ref 27.0–33.0)
MCHC: 33.5 g/dL (ref 32.0–36.0)
MCV: 90.6 fL (ref 80.0–100.0)
MONOS PCT: 10 %
MPV: 11.4 fL (ref 7.5–12.5)
Monocytes Absolute: 430 cells/uL (ref 200–950)
NEUTROS ABS: 2279 {cells}/uL (ref 1500–7800)
Neutrophils Relative %: 53 %
PLATELETS: 95 10*3/uL — AB (ref 140–400)
RBC: 4.48 MIL/uL (ref 4.20–5.80)
RDW: 13.8 % (ref 11.0–15.0)
WBC: 4.3 10*3/uL (ref 3.8–10.8)

## 2017-02-04 LAB — COMPREHENSIVE METABOLIC PANEL
ALBUMIN: 4 g/dL (ref 3.6–5.1)
ALK PHOS: 118 U/L — AB (ref 40–115)
ALT: 59 U/L — AB (ref 9–46)
AST: 43 U/L — ABNORMAL HIGH (ref 10–35)
BILIRUBIN TOTAL: 0.4 mg/dL (ref 0.2–1.2)
BUN: 22 mg/dL (ref 7–25)
CALCIUM: 8.7 mg/dL (ref 8.6–10.3)
CO2: 19 mmol/L — ABNORMAL LOW (ref 20–31)
Chloride: 104 mmol/L (ref 98–110)
Creat: 1.32 mg/dL (ref 0.70–1.33)
Glucose, Bld: 210 mg/dL — ABNORMAL HIGH (ref 65–99)
Potassium: 4.7 mmol/L (ref 3.5–5.3)
Sodium: 135 mmol/L (ref 135–146)
TOTAL PROTEIN: 7 g/dL (ref 6.1–8.1)

## 2017-02-05 LAB — PROTIME-INR
INR: 1
Prothrombin Time: 10.4 s (ref 9.0–11.5)

## 2017-02-05 LAB — TISSUE TRANSGLUTAMINASE, IGA: TISSUE TRANSGLUTAMINASE AB, IGA: 31 U/mL — AB (ref <4)

## 2017-02-06 LAB — VITAMIN D 1,25 DIHYDROXY
Vitamin D 1, 25 (OH)2 Total: 40 pg/mL (ref 18–72)
Vitamin D3 1, 25 (OH)2: 40 pg/mL

## 2017-02-10 ENCOUNTER — Other Ambulatory Visit: Payer: Self-pay | Admitting: Gastroenterology

## 2017-02-10 ENCOUNTER — Encounter: Payer: Self-pay | Admitting: Gastroenterology

## 2017-02-10 DIAGNOSIS — K7460 Unspecified cirrhosis of liver: Secondary | ICD-10-CM

## 2017-02-10 DIAGNOSIS — K900 Celiac disease: Secondary | ICD-10-CM

## 2017-02-10 NOTE — Progress Notes (Signed)
MELD 9. Some bump in ast/alt but overall stable.  TTG down from 100 to 31, still elevated indicated some gluten exposure.  Vit d level ok. Glucose 210  Works towards tight diabetes control and gluten free diet.   Recheck TTG, IgA, CMET, PT/INR, CBC in 6 months.

## 2017-02-25 ENCOUNTER — Ambulatory Visit (HOSPITAL_COMMUNITY)
Admission: RE | Admit: 2017-02-25 | Discharge: 2017-02-25 | Disposition: A | Discharge status: Home / Self Care | Payer: BLUE CROSS/BLUE SHIELD | Source: Ambulatory Visit | Attending: Internal Medicine | Admitting: Internal Medicine

## 2017-02-25 DIAGNOSIS — N281 Cyst of kidney, acquired: Secondary | ICD-10-CM | POA: Diagnosis not present

## 2017-02-25 DIAGNOSIS — K7460 Unspecified cirrhosis of liver: Secondary | ICD-10-CM | POA: Diagnosis not present

## 2017-02-25 DIAGNOSIS — R161 Splenomegaly, not elsewhere classified: Secondary | ICD-10-CM | POA: Insufficient documentation

## 2017-02-26 NOTE — Progress Notes (Signed)
ON RECALL  °

## 2017-03-09 ENCOUNTER — Encounter: Payer: Self-pay | Admitting: Internal Medicine

## 2017-03-09 ENCOUNTER — Ambulatory Visit (INDEPENDENT_AMBULATORY_CARE_PROVIDER_SITE_OTHER): Payer: BLUE CROSS/BLUE SHIELD | Admitting: Internal Medicine

## 2017-03-09 VITALS — BP 115/66 | HR 66 | Temp 96.9°F | Ht 67.0 in | Wt 226.6 lb

## 2017-03-09 DIAGNOSIS — K900 Celiac disease: Secondary | ICD-10-CM

## 2017-03-09 DIAGNOSIS — K7469 Other cirrhosis of liver: Secondary | ICD-10-CM

## 2017-03-09 NOTE — Patient Instructions (Signed)
Office visit in 3 months to set up variceal bleeding  Liver ultrasound every 6 months  Complete vaccination series

## 2017-03-09 NOTE — Progress Notes (Signed)
Primary Care Physician:  [name], DO Primary Gastroenterologist:  Dr. [name]  Pre-Procedure History & Physical: HPI:  Michael Harmon is a 56 y.o. male here for f/u cirrhosis follow-up.  Patient did see [name] at [facility] at patient's request. Meld 7. They plan on seeing him at 6 month intervals. He's getting hepatic ultrasound to screen every 6 months. He has been bradycardic and has had low heart rates in the 60s. Being evaluated by cardiology. He has known  grade 2-3 esophageal varices 4 columns. No prior bleeding. Held on nonspecific beta blocker therapy because of a heart rate issues. It may well be that he's not going to be a candidate for beta blockade and may need prophylactic banding. He is in the process of receiving his hepatitis B and A vaccine.     Is followed by hematology in [place] for ITP. However, I wonder if his low platelet count is simply a manifestation of cirrhosis with portal hypertension and splenomegaly.  He continues to follow a gluten-free diet. Diarrhea has improved.  Improved IgA TTG antibody from 131. Mildly nonspecifically elevated AST and ALT at 43 and 59, respectively. No alcohol in many years.   Past Medical History:  Diagnosis Date  . Anemia   . Asthma   . Celiac disease   . DM (diabetes mellitus) ([place])   . Hyperlipidemia    diet controlled now with 100 pound weight loss  . Hypertension   . Splenomegaly   . Thrombocytopenia ([place])    hematology, ?related to chol med (tricor) and glimeripide, just being monitored, above 100,000    Past Surgical History:  Procedure Laterality Date  . BIOPSY  07/09/2016   Procedure: BIOPSY;  Surgeon: [name], MD;  [facility];  Service: Endoscopy;;  duodenal, gastric, esophaageal  . COLONOSCOPY  11/2015   Dr. Posey Pronto: cecal bx neg for microscopic colitis. ascending colon polyp (adenomatous)  . ESOPHAGOGASTRODUODENOSCOPY N/A 07/09/2016   Procedure: ESOPHAGOGASTRODUODENOSCOPY (EGD);   Surgeon: [name], MD;  [facility];  Service: Endoscopy;  Laterality: N/A;  730    Prior to Admission medications   Medication Sig Start Date End Date Taking? Authorizing Provider  benazepril-hydrochlorthiazide (LOTENSIN HCT) 10-12.5 MG tablet Take 1 tablet by mouth daily. 02/17/17  Yes [provider]  cetirizine (ZYRTEC) 10 MG tablet Take 10 mg by mouth daily.   Yes [provider]  cholecalciferol (VITAMIN D) 1000 units tablet Take 2,000 Units by mouth daily.    Yes [provider]  glimepiride (AMARYL) 1 MG tablet Take 1 mg by mouth 2 (two) times daily. 12/12/16  Yes [provider]  multivitamin (ONE-A-DAY MEN'S) TABS tablet Take 1 tablet by mouth daily.   Yes [provider]  benazepril (LOTENSIN) 20 MG tablet Take 20 mg by mouth daily.  02/26/16   [provider]  glimepiride (AMARYL) 4 MG tablet 1 mg at bedtime.  04/01/16   [provider]    Allergies as of 03/09/2017 - Review Complete 03/09/2017  Allergen Reaction Noted  . Penicillins Other (See Comments) 04/07/2016    Family History  Problem Relation Age of Onset  . Other Mother     chronic diarrhea  . Liver disease Neg Hx   . Colon cancer Neg Hx     Social History   Social History  . Marital status: Single    Spouse name: N/A  . Number of children: 0  . Years of education:  N/A   Occupational History  . Customer service manager, [place], travels    Social History Main Topics  . Smoking status: Never Smoker  . Smokeless tobacco: Never Used  . Alcohol use 0.0 oz/week     Comment: rare, 1-2 drinks a month, on vacation  . Drug use: No  . Sexual activity: Not on file   Other Topics Concern  . Not on file   Social History Narrative  . No narrative on file    Review of Systems: See HPI, otherwise negative ROS  Physical Exam: BP 115/66   Pulse 66   Temp (!) 96.9 F (36.1 C) (Oral)   Ht 5' 7"  (1.702 m)   Wt 226 lb 9.6  oz (102.8 kg)   BMI 35.49 kg/m  General:   Alert,  Well-developed, well-nourished, pleasant and cooperative in NAD Neck:  Supple; no masses or thyromegaly. No significant cervical adenopathy. Lungs:  Clear throughout to auscultation.   No wheezes, crackles, or rhonchi. No acute distress. Heart:  Regular rate and rhythm; no murmurs, clicks, rubs,  or gallops. Abdomen: Non-distended, normal bowel sounds.  Soft and nontender without appreciable mass or hepatosplenomegaly.  Pulses:  Normal pulses noted. Extremities:  Without clubbing or edema.  Impression:  Pleasant 56 year old gentleman with a history of cirrhosis. Likely a component of ASH/NNASH. Cannot rule out a component of autoimmune disease related to celiac. Transglutaminase markers are coming down nicely.  He has known esophageal varices. He is now had an additional evaluation down at [facility] for future reference.  He has grade 2-3 esophageal varices. I suspect he will not be a candidate for beta blockade and, therefore, will need to go ahead and have prophylactic banding in the near future. He is slated to see his cardiologist soon  Recommendations:  Office visit in 3 months to set up variceal bleeding  Liver ultrasound every 6 months  Complete vaccination series    Notice: This dictation was prepared with Dragon dictation along with smaller phrase technology. Any transcriptional errors that result from this process are unintentional and may not be corrected upon review.

## 2017-03-15 ENCOUNTER — Ambulatory Visit: Payer: BLUE CROSS/BLUE SHIELD | Admitting: Gastroenterology

## 2017-05-13 ENCOUNTER — Telehealth: Payer: Self-pay | Admitting: Internal Medicine

## 2017-05-13 NOTE — Telephone Encounter (Signed)
noted 

## 2017-05-13 NOTE — Telephone Encounter (Signed)
Pt came to the front office to say that he hadn't heard from [place] about scheduling his OV for August. He was last seen in May and needed a 3 month follow up to set up a variceal banding. He is aware of his OV in Sept and I explained to him why August schedule was already filled and I would put him on a cancellation list if something becomes available before then. I also offered him OV for this afternoon, but he declined. He is aware his labs are due in October and U/S due in November. He said he would be on a cruise between November 10-22.

## 2017-06-24 DIAGNOSIS — K766 Portal hypertension: Secondary | ICD-10-CM | POA: Insufficient documentation

## 2017-07-06 ENCOUNTER — Ambulatory Visit (INDEPENDENT_AMBULATORY_CARE_PROVIDER_SITE_OTHER): Payer: BLUE CROSS/BLUE SHIELD | Admitting: Family Medicine

## 2017-07-06 ENCOUNTER — Encounter: Payer: Self-pay | Admitting: Family Medicine

## 2017-07-06 VITALS — BP 122/76 | HR 68 | Temp 97.6°F | Resp 18 | Ht 67.0 in | Wt 230.1 lb

## 2017-07-06 DIAGNOSIS — N401 Enlarged prostate with lower urinary tract symptoms: Secondary | ICD-10-CM

## 2017-07-06 DIAGNOSIS — E119 Type 2 diabetes mellitus without complications: Secondary | ICD-10-CM | POA: Diagnosis not present

## 2017-07-06 DIAGNOSIS — I10 Essential (primary) hypertension: Secondary | ICD-10-CM | POA: Insufficient documentation

## 2017-07-06 DIAGNOSIS — D696 Thrombocytopenia, unspecified: Secondary | ICD-10-CM

## 2017-07-06 DIAGNOSIS — Z9109 Other allergy status, other than to drugs and biological substances: Secondary | ICD-10-CM | POA: Insufficient documentation

## 2017-07-06 DIAGNOSIS — F4321 Adjustment disorder with depressed mood: Secondary | ICD-10-CM | POA: Insufficient documentation

## 2017-07-06 DIAGNOSIS — N138 Other obstructive and reflux uropathy: Secondary | ICD-10-CM | POA: Insufficient documentation

## 2017-07-06 DIAGNOSIS — E785 Hyperlipidemia, unspecified: Secondary | ICD-10-CM | POA: Diagnosis not present

## 2017-07-06 DIAGNOSIS — E1169 Type 2 diabetes mellitus with other specified complication: Secondary | ICD-10-CM | POA: Insufficient documentation

## 2017-07-06 DIAGNOSIS — K7469 Other cirrhosis of liver: Secondary | ICD-10-CM | POA: Diagnosis not present

## 2017-07-06 DIAGNOSIS — I8500 Esophageal varices without bleeding: Secondary | ICD-10-CM | POA: Diagnosis not present

## 2017-07-06 HISTORY — DX: Adjustment disorder with depressed mood: F43.21

## 2017-07-06 LAB — CBC
HCT: 43.4 % (ref 38.5–50.0)
HEMOGLOBIN: 14.6 g/dL (ref 13.2–17.1)
MCH: 30.5 pg (ref 27.0–33.0)
MCHC: 33.6 g/dL (ref 32.0–36.0)
MCV: 90.8 fL (ref 80.0–100.0)
MPV: 11 fL (ref 7.5–12.5)
PLATELETS: 112 10*3/uL — AB (ref 140–400)
RBC: 4.78 MIL/uL (ref 4.20–5.80)
RDW: 13.4 % (ref 11.0–15.0)
WBC: 4.6 10*3/uL (ref 3.8–10.8)

## 2017-07-06 NOTE — Progress Notes (Signed)
Chief Complaint  Patient presents with  . Hypertension  . Depression  . Cirrhosis  . 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  No ascites.  Regular screen for hepatocellular carcinoma. He has diabetes.  Well controlled by history. Due for labs. BP well controlled Hyperlipidemia - off statins.  Will check lipid panel He has celiac disease and is on a restricted diet. He finds the diabetic/celiac diet "very difficult" He has mild BPH with nocturia X1.  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 Does not want counseling or medicine.  Discussed healthy diet/exercise/sleep recomendations.   Patient Active Problem List   Diagnosis Date Noted  . Essential hypertension 07/06/2017  . Type 2 diabetes mellitus without complication, without long-term current use of insulin ([place]) 07/06/2017  . HLD (hyperlipidemia) 07/06/2017  . Environmental allergies 07/06/2017  . BPH with obstruction/lower urinary tract symptoms 07/06/2017  . Situational depression 07/06/2017  . Esophageal varices determined by endoscopy ([place]) 07/06/2017  . Portal hypertension ([place]) 06/24/2017  . Vitamin D deficiency 08/06/2016  . Hepatic cirrhosis ([place]) 08/06/2016  . Hallux valgus 07/14/2016  . Thrombocytopenia ([place]) 04/07/2016  . Adult form of celiac disease 04/07/2016  . Hx of adenomatous colonic polyps 04/07/2016    Outpatient Encounter Prescriptions as of 07/06/2017  Medication Sig  . benazepril-hydrochlorthiazide (LOTENSIN HCT) 10-12.5 MG tablet Take 1 tablet by mouth daily.  . cholecalciferol (VITAMIN D) 1000 units tablet Take 2,000 Units by mouth daily.   Marland Kitchen glimepiride (AMARYL) 1 MG tablet Take 1 mg by  mouth 2 (two) times daily.  . multivitamin (ONE-A-DAY MEN'S) TABS tablet Take 1 tablet by mouth daily.   No facility-administered encounter medications on file as of 07/06/2017.     Past Medical History:  Diagnosis Date  . Allergy    nose and sinus problems  . Anemia   . Asthma   . Celiac disease   . Cirrhosis, cryptogenic ([place])   . DM (diabetes mellitus) ([place])   . GERD (gastroesophageal reflux disease)   . Hyperlipidemia    diet controlled now with 100 pound weight loss  . Hypertension   . Situational depression 07/06/2017  . Splenomegaly   . Thrombocytopenia ([place])    hematology, ?related to chol med (tricor) and glimeripide, just being monitored, above 100,000    Past Surgical History:  Procedure Laterality Date  . BIOPSY  07/09/2016   Procedure: BIOPSY;  Surgeon: [name], MD;  [facility];  Service: Endoscopy;;  duodenal, gastric, esophaageal  . CLOSED MANIPULATION SHOULDER    . COLONOSCOPY  11/2015   Dr. Posey Pronto: cecal bx neg for microscopic colitis. ascending colon polyp (adenomatous)  . ESOPHAGOGASTRODUODENOSCOPY N/A 07/09/2016   Procedure: ESOPHAGOGASTRODUODENOSCOPY (EGD);  Surgeon: [name], MD;  [facility];  Service: Endoscopy;  Laterality: N/A;  730    Social History   Social History  . Marital status: Married    Spouse name: Melissa  . Number of children: 0  . Years of education: 14   Occupational History  . Customer service manager, [place], travels    Social History Main Topics  . Smoking status: Never Smoker  . Smokeless tobacco: Never Used  . Alcohol use  0.0 oz/week     Comment: rare, 1-2 drinks a month, on vacation  . Drug use: No  . Sexual activity: Not Currently   Other Topics Concern  . Not on file   Social History Narrative   Lives at home with wife Melissa   One dog       Family History  Problem Relation Age of Onset  . Other Mother        chronic diarrhea  . COPD Mother   . Heart disease  Mother        died at 34  . Arthritis Mother   . Cancer Mother   . Depression Mother   . Diabetes Mother   . Hyperlipidemia Mother   . Hypertension Mother   . Stroke Mother   . Arthritis Father   . Asthma Father   . Birth defects Father   . Heart disease Father        aortic valve replaced  . Heart disease Maternal Grandmother   . Stroke Maternal Grandmother   . Heart disease Maternal Grandfather   . Stroke Maternal Grandfather   . Diabetes Paternal Grandmother   . Stroke Paternal Grandfather   . Heart disease Paternal Grandfather   . Liver disease Neg Hx   . Colon cancer Neg Hx     Review of Systems  Constitutional: Negative for chills, fever, malaise/fatigue and weight loss.       ( weight gain)  HENT: Negative for congestion and hearing loss.   Eyes: Negative for blurred vision and pain.  Respiratory: Negative for cough and shortness of breath.   Cardiovascular: Negative for chest pain and leg swelling.  Gastrointestinal: Negative for abdominal pain, constipation, diarrhea and heartburn.       Diarrhea if does not watch diet  Genitourinary: Negative for dysuria and frequency.       Nocturia X1  Musculoskeletal: Negative for falls, joint pain and myalgias.  Skin: Negative for itching and rash.  Neurological: Positive for headaches. Negative for dizziness and seizures.        occular migraine ?  Psychiatric/Behavioral: Positive for depression. Negative for substance abuse and suicidal ideas. The patient is not nervous/anxious and does not have insomnia.     BP 122/76 (BP Location: Right Arm, Patient Position: Sitting, Cuff Size: Normal)   Pulse 68   Temp 97.6 F (36.4 C) (Temporal)   Resp 18   Ht 5' 7"  (1.702 m)   Wt 230 lb 1.3 oz (104.4 kg)   SpO2 97%   BMI 36.04 kg/m   Physical Exam  Constitutional: He is oriented to person, place, and time. He appears well-developed and well-nourished. No distress.  HENT:  Head: Normocephalic and atraumatic.    Mouth/Throat: Oropharynx is clear and moist.  Eyes: Pupils are equal, round, and reactive to light. Conjunctivae are normal.  Neck: Normal range of motion. Neck supple.  Cardiovascular: Normal rate, regular rhythm and normal heart sounds.   Pulmonary/Chest: Effort normal and breath sounds normal. No respiratory distress.  Abdominal: Soft. Bowel sounds are normal.  protuberant  Musculoskeletal: Normal range of motion. He exhibits no edema.  Neurological: He is alert and oriented to person, place, and time.  Gait normal  Skin: Skin is warm and dry.  Psychiatric: His behavior is normal. Thought content normal.  Depressed mood  Nursing note and vitals reviewed. ASSESSMENT/PLAN:   1. Essential hypertension controlled  2. Type 2 diabetes mellitus without complication, without long-term current use of insulin ([facility]) Controlled  by history - CBC - COMPLETE METABOLIC PANEL WITH GFR - Lipid panel - Urinalysis, Routine w reflex microscopic - Hemoglobin A1c - Microalbumin / creatinine urine ratio  3. Hyperlipidemia, unspecified hyperlipidemia type Not on medicine - discusssed  4. Cirrhosis, cryptogenic ([place]) Under care GI - CBC with Differential/Platelet  5. Situational depression Discussed.  Declines referral or teatement  6. Thrombocytopenia ([place]) Will follow  7. Esophageal varices determined by endoscopy [facility]) To see GI next week to discuss banding   Patient Instructions  Walk every day that you are able Need blood work today Need records from old PCP Come back to see me for PE in 3 months Call sooner for problems    Raylene Everts, MD

## 2017-07-06 NOTE — Patient Instructions (Addendum)
Walk every day that you are able Need blood work today Need records from old PCP Come back to see me for PE in 3 months Call sooner for problems

## 2017-07-07 LAB — COMPLETE METABOLIC PANEL WITH GFR
ALBUMIN: 4.2 g/dL (ref 3.6–5.1)
ALK PHOS: 87 U/L (ref 40–115)
ALT: 35 U/L (ref 9–46)
AST: 31 U/L (ref 10–35)
BUN: 22 mg/dL (ref 7–25)
CO2: 21 mmol/L (ref 20–32)
Calcium: 9.2 mg/dL (ref 8.6–10.3)
Chloride: 104 mmol/L (ref 98–110)
Creat: 1.11 mg/dL (ref 0.70–1.33)
GFR, EST NON AFRICAN AMERICAN: 73 mL/min (ref >=60)
GFR, Est African American: 85 mL/min (ref >=60)
GLUCOSE: 169 mg/dL — AB (ref 65–99)
POTASSIUM: 5.2 mmol/L (ref 3.5–5.3)
SODIUM: 136 mmol/L (ref 135–146)
Total Bilirubin: 0.6 mg/dL (ref 0.2–1.2)
Total Protein: 7.2 g/dL (ref 6.1–8.1)

## 2017-07-07 LAB — MICROALBUMIN / CREATININE URINE RATIO
Creatinine, Urine: 35 mg/dL (ref 20–370)
MICROALB UR: 0.2 mg/dL
MICROALB/CREAT RATIO: 6 ug/mg{creat} (ref <30)

## 2017-07-07 LAB — LIPID PANEL
CHOL/HDL RATIO: 5.4 ratio — AB (ref <5.0)
Cholesterol: 199 mg/dL (ref <200)
HDL: 37 mg/dL — AB (ref >40)
LDL Cholesterol: 131 mg/dL — ABNORMAL HIGH (ref <100)
Triglycerides: 155 mg/dL — ABNORMAL HIGH (ref <150)
VLDL: 31 mg/dL — ABNORMAL HIGH (ref <30)

## 2017-07-07 LAB — URINALYSIS, ROUTINE W REFLEX MICROSCOPIC
BILIRUBIN URINE: NEGATIVE
Glucose, UA: NEGATIVE
Hgb urine dipstick: NEGATIVE
KETONES UR: NEGATIVE
Leukocytes, UA: NEGATIVE
Nitrite: NEGATIVE
PH: 6.5 (ref 5.0–8.0)
Protein, ur: NEGATIVE
SPECIFIC GRAVITY, URINE: 1.006 (ref 1.001–1.035)

## 2017-07-07 LAB — HEMOGLOBIN A1C
Hgb A1c MFr Bld: 7.9 % — ABNORMAL HIGH (ref <5.7)
Mean Plasma Glucose: 180 mg/dL

## 2017-07-08 ENCOUNTER — Ambulatory Visit (INDEPENDENT_AMBULATORY_CARE_PROVIDER_SITE_OTHER): Payer: BLUE CROSS/BLUE SHIELD | Admitting: Gastroenterology

## 2017-07-08 ENCOUNTER — Telehealth: Payer: Self-pay

## 2017-07-08 ENCOUNTER — Encounter: Payer: Self-pay | Admitting: Gastroenterology

## 2017-07-08 ENCOUNTER — Other Ambulatory Visit: Payer: Self-pay

## 2017-07-08 VITALS — BP 126/66 | HR 62 | Temp 97.6°F | Ht 67.0 in | Wt 228.0 lb

## 2017-07-08 DIAGNOSIS — I8500 Esophageal varices without bleeding: Secondary | ICD-10-CM

## 2017-07-08 DIAGNOSIS — K7460 Unspecified cirrhosis of liver: Secondary | ICD-10-CM | POA: Diagnosis not present

## 2017-07-08 DIAGNOSIS — N281 Cyst of kidney, acquired: Secondary | ICD-10-CM

## 2017-07-08 DIAGNOSIS — K900 Celiac disease: Secondary | ICD-10-CM | POA: Diagnosis not present

## 2017-07-08 NOTE — Assessment & Plan Note (Signed)
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   He has known grade 2-3 esophageal varices. He is not a good candidate for beta blockade due to baseline bradycardia. Therefore at this time would offer prophylactic banding via EGD with Dr. [name]. I have discussed the risks, alternatives, benefits with regards to but not limited to the risk of reaction to medication, bleeding, infection, perforation and the patient is agreeable to proceed. Written consent to be obtained.  He is currently up-to-date on labs. He has completed hepatitis A and B vaccinations.He will be due for abdominal ultrasound in October for hepatoma surveillance and request follow-up of kidney lesion which was new on last study.  Given recent labs, we will postpone her labs until February 2019 which will include TTG, IgA and liver labs (CBC, CMET, PT/INR, AFP tumor marker). Return to the office in February.

## 2017-07-08 NOTE — Telephone Encounter (Signed)
Called [employer] to see if pt needed PA for EGD/esoph variceal banding. No PA needed. Ref# [id number].

## 2017-07-08 NOTE — Progress Notes (Signed)
Please update NICs.  For labs: postpone 08/2017 labs until February 2019. He will need TTG, IgA, CBC, PT/INR, CMET, AFP tumor marker.   For Abd u/s: he will need in 08/2017. DX: hepatoma surveillance, right renal cyst. PLEASE MAKE SURE THIS IS SCHEDULED FOR SOMETIME IN 08/2017 DUE TO PATIENT REQUEST.

## 2017-07-08 NOTE — Assessment & Plan Note (Signed)
Clinically doing well. Maintaining gluten-free diet. Consider bone density study bladder part of 2019. Update TTG serologies in February.

## 2017-07-08 NOTE — Progress Notes (Signed)
US abdomen complete scheduled for 08/03/17 at 9:30am, pt to arrive at 9:15am. NPO after midnight before test. Letter mailed to pt.

## 2017-07-08 NOTE — Progress Notes (Signed)
Primary Care Physician: Raylene Everts, MD  Primary Gastroenterologist:  Garfield Cornea, MD   Chief Complaint  Patient presents with  . Gastroesophageal Reflux    HPI: Michael Harmon is a 56 y.o. male here for follow-up cirrhosis care, celiac disease, and GERD. He is here to schedule EGD with variceal banding as he is not been a candidate for nonselective beta blockers given low heart rate. He will be due for ultrasound next month for hepatoma screening. He is concerned about a kidney cyst that was seen on his last u/s. His labs are up-to-date. Recently seen by hepatologist at [facility] for follow-up on August 23. He will continue annual visits with them. Recent MELD 7. Completed Hep A and B vaccinations this month.  Overall he feels well. He has some fatigue but relates this to poor sleep and taking care of his disabled wife. His appetite is good. He is frustrated that his diabetes is less controlled. He states this is from gluten-free diet. Hemoglobin went from 5-7. Recently established care with Dr. Meda Coffee. He denies abdominal pain. No diarrhea. Stools are [place] 4-5. No blood in the stool or melena. He denies pruritus, jaundice, edema. No episodes of confusion.       Current Outpatient Prescriptions  Medication Sig Dispense Refill  . benazepril-hydrochlorthiazide (LOTENSIN HCT) 10-12.5 MG tablet Take 1 tablet by mouth daily.    . cholecalciferol (VITAMIN D) 1000 units tablet Take 2,000 Units by mouth daily.     Marland Kitchen glimepiride (AMARYL) 1 MG tablet Take 1 mg by mouth 2 (two) times daily.    . multivitamin (ONE-A-DAY MEN'S) TABS tablet Take 1 tablet by mouth daily.    . vitamin E 1000 UNIT capsule Take 1,000 Units by mouth daily.     No current facility-administered medications for this visit.     Allergies as of 07/08/2017 - Review Complete 07/08/2017  Allergen Reaction Noted  . Penicillins Other (See Comments) 04/07/2016  . Clarithromycin Tinitus     Past Medical History:   Diagnosis Date  . Allergy    nose and sinus problems  . Anemia   . Asthma   . Celiac disease   . Cirrhosis, cryptogenic ([place])   . DM (diabetes mellitus) ([place])   . GERD (gastroesophageal reflux disease)   . Hyperlipidemia    diet controlled now with 100 pound weight loss  . Hypertension   . Situational depression 07/06/2017  . Splenomegaly   . Thrombocytopenia ([place])    hematology, ?related to chol med (tricor) and glimeripide, just being monitored, above 100,000   Past Surgical History:  Procedure Laterality Date  . BIOPSY  07/09/2016   Procedure: BIOPSY;  Surgeon: [name], MD;  [facility];  Service: Endoscopy;;  duodenal, gastric, esophaageal  . CLOSED MANIPULATION SHOULDER    . COLONOSCOPY  11/2015   Dr. Posey Pronto: cecal bx neg for microscopic colitis. ascending colon polyp (adenomatous)  . ESOPHAGOGASTRODUODENOSCOPY N/A 07/09/2016   Procedure: ESOPHAGOGASTRODUODENOSCOPY (EGD);  Surgeon: [name], MD;  [facility];  Service: Endoscopy;  Laterality: N/A;  730   Family History  Problem Relation Age of Onset  . Other Mother        chronic diarrhea  . COPD Mother   . Heart disease Mother        died at 40  . Arthritis Mother   . Cancer Mother   . Depression Mother   . Diabetes Mother   . Hyperlipidemia  Mother   . Hypertension Mother   . Stroke Mother   . Arthritis Father   . Asthma Father   . Birth defects Father   . Heart disease Father        aortic valve replaced  . Heart disease Maternal Grandmother   . Stroke Maternal Grandmother   . Heart disease Maternal Grandfather   . Stroke Maternal Grandfather   . Diabetes Paternal Grandmother   . Stroke Paternal Grandfather   . Heart disease Paternal Grandfather   . Liver disease Neg Hx   . Colon cancer Neg Hx    Social History   Social History  . Marital status: Married    Spouse name: Melissa  . Number of children: 0  . Years of education: 14   Occupational History  . Designer, industrial/product, [place], travels    Social History Main Topics  . Smoking status: Never Smoker  . Smokeless tobacco: Never Used  . Alcohol use 0.0 oz/week     Comment: rare, 1-2 drinks a month, on vacation  . Drug use: No  . Sexual activity: Not Currently   Other Topics Concern  . None   Social History Narrative   Lives at home with wife Melissa   One dog        ROS:  General: Negative for anorexia, weight loss, fever, chills, fatigue, weakness. ENT: Negative for hoarseness, difficulty swallowing , nasal congestion. CV: Negative for chest pain, angina, palpitations, dyspnea on exertion, peripheral edema.  Respiratory: Negative for dyspnea at rest, dyspnea on exertion, cough, sputum, wheezing.  GI: See history of present illness. GU:  Negative for dysuria, hematuria, urinary incontinence, urinary frequency, nocturnal urination.  Endo: Negative for unusual weight change.    Physical Examination:   BP 126/66   Pulse 62   Temp 97.6 F (36.4 C) (Oral)   Ht 5' 7"  (1.702 m)   Wt 228 lb (103.4 kg)   BMI 35.71 kg/m   General: Well-nourished, well-developed in no acute distress.  Eyes: No icterus. Mouth: Oropharyngeal mucosa moist and pink , no lesions erythema or exudate. Lungs: Clear to auscultation bilaterally.  Heart: Regular rate and rhythm, no murmurs rubs or gallops.  Abdomen: Bowel sounds are normal, nontender, nondistended, no hepatosplenomegaly or masses, no abdominal bruits or hernia , no rebound or guarding.   Extremities: No lower extremity edema. No clubbing or deformities. Neuro: Alert and oriented x 4   Skin: Warm and dry, no jaundice.   Psych: Alert and cooperative, normal mood and affect.  Labs:  Lab Results  Component Value Date   HGBA1C 7.9 (H) 07/06/2017   Lab Results  Component Value Date   CREATININE 1.11 07/06/2017   BUN 22 07/06/2017   NA 136 07/06/2017   K 5.2 07/06/2017   CL 104 07/06/2017   CO2 21 07/06/2017   Lab Results    Component Value Date   ALT 35 07/06/2017   AST 31 07/06/2017   ALKPHOS 87 07/06/2017   BILITOT 0.6 07/06/2017   Lab Results  Component Value Date   WBC 4.6 07/06/2017   HGB 14.6 07/06/2017   HCT 43.4 07/06/2017   MCV 90.8 07/06/2017   PLT 112 (L) 07/06/2017    Labs done at [facility] in August 2018, platelets 92,000, total bilirubin 0.7, alkaline phosphatase 85, AST 47, ALT 54, albumin 4, INR 1 Imaging Studies: No results found.

## 2017-07-08 NOTE — Patient Instructions (Signed)
1. Upper endoscopy as scheduled. See separate instructions.  2. We will have you update the ultrasound in 08/2017. You will be due labs in 12/2017 as discussed.  3. Return to the office in six months.

## 2017-07-08 NOTE — Progress Notes (Signed)
cc'ed to pcp °

## 2017-07-09 ENCOUNTER — Encounter: Payer: Self-pay | Admitting: Family Medicine

## 2017-07-09 MED ORDER — ATORVASTATIN CALCIUM 20 MG PO TABS
20.0000 mg | ORAL_TABLET | Freq: Every day | ORAL | 3 refills | Status: DC
Start: 2017-07-09 — End: 2018-04-15

## 2017-07-09 MED ORDER — GLIMEPIRIDE 2 MG PO TABS: 2.0000 mg | ORAL_TABLET | Freq: Two times a day (BID) | ORAL | 3 refills | Status: DC

## 2017-07-13 ENCOUNTER — Other Ambulatory Visit: Payer: Self-pay | Admitting: Gastroenterology

## 2017-07-13 DIAGNOSIS — K7460 Unspecified cirrhosis of liver: Secondary | ICD-10-CM

## 2017-07-13 NOTE — Progress Notes (Signed)
Additional lab orders have been added and pt is on lab recall for Feb 2019

## 2017-07-14 MED ORDER — OMEPRAZOLE 20 MG PO CPDR
20.0000 mg | DELAYED_RELEASE_CAPSULE | Freq: Every day | ORAL | 3 refills | Status: DC
Start: 2017-07-14 — End: 2017-08-04

## 2017-07-14 NOTE — Progress Notes (Addendum)
I noticed patient came off his PPI. Based on his prior EGD last year, Can we please ask he to restart his omeprazole 68m 30 minutes before breakfast. I sent in rx.

## 2017-07-14 NOTE — Addendum Note (Signed)
Addended by: [name] on: 07/14/2017 08:42 AM   Modules accepted: Orders

## 2017-07-19 NOTE — Progress Notes (Signed)
Tried to call pt- NA 

## 2017-07-20 ENCOUNTER — Encounter (INDEPENDENT_AMBULATORY_CARE_PROVIDER_SITE_OTHER): Payer: Self-pay

## 2017-07-20 ENCOUNTER — Telehealth: Payer: Self-pay | Admitting: Family Medicine

## 2017-07-20 NOTE — Telephone Encounter (Signed)
PATIENT IS CONCERNED THAT STATIN MAY AFFECT LIVER ISSUES PLEASE ADVISE. [phone]

## 2017-07-20 NOTE — Telephone Encounter (Signed)
Statins very infrequently affect the liver   It is safe to take, and check his liver in 6-8 weeks/

## 2017-07-21 NOTE — Telephone Encounter (Signed)
Left message to call office

## 2017-07-22 NOTE — Telephone Encounter (Signed)
Spoke to wife, will have him call tomorrow,

## 2017-07-26 NOTE — Progress Notes (Signed)
I have sent him a message in [place] and mailed a letter.

## 2017-08-03 ENCOUNTER — Ambulatory Visit (HOSPITAL_COMMUNITY)
Admission: RE | Admit: 2017-08-03 | Discharge: 2017-08-03 | Disposition: A | Discharge status: Home / Self Care | Payer: BLUE CROSS/BLUE SHIELD | Source: Ambulatory Visit | Attending: Gastroenterology | Admitting: Gastroenterology

## 2017-08-03 DIAGNOSIS — K7460 Unspecified cirrhosis of liver: Secondary | ICD-10-CM | POA: Diagnosis present

## 2017-08-03 DIAGNOSIS — N281 Cyst of kidney, acquired: Secondary | ICD-10-CM | POA: Diagnosis not present

## 2017-08-04 ENCOUNTER — Encounter (HOSPITAL_COMMUNITY)
Admission: RE | Disposition: A | Discharge status: Home / Self Care | Payer: Self-pay | Source: Ambulatory Visit | Attending: Internal Medicine

## 2017-08-04 ENCOUNTER — Ambulatory Visit (HOSPITAL_COMMUNITY)
Admission: RE | Admit: 2017-08-04 | Discharge: 2017-08-04 | Disposition: A | Discharge status: Home / Self Care | Payer: BLUE CROSS/BLUE SHIELD | Source: Ambulatory Visit | Attending: Internal Medicine | Admitting: Internal Medicine

## 2017-08-04 ENCOUNTER — Encounter (HOSPITAL_COMMUNITY): Payer: Self-pay

## 2017-08-04 DIAGNOSIS — K766 Portal hypertension: Secondary | ICD-10-CM | POA: Diagnosis not present

## 2017-08-04 DIAGNOSIS — K7460 Unspecified cirrhosis of liver: Secondary | ICD-10-CM | POA: Insufficient documentation

## 2017-08-04 DIAGNOSIS — I8500 Esophageal varices without bleeding: Secondary | ICD-10-CM | POA: Diagnosis not present

## 2017-08-04 DIAGNOSIS — K3189 Other diseases of stomach and duodenum: Secondary | ICD-10-CM | POA: Diagnosis not present

## 2017-08-04 HISTORY — PX: ESOPHAGEAL BANDING: SHX5518

## 2017-08-04 HISTORY — PX: ESOPHAGOGASTRODUODENOSCOPY: SHX5428

## 2017-08-04 LAB — GLUCOSE, CAPILLARY: GLUCOSE-CAPILLARY: 110 mg/dL — AB (ref 65–99)

## 2017-08-04 SURGERY — EGD (ESOPHAGOGASTRODUODENOSCOPY)
Anesthesia: Moderate Sedation

## 2017-08-04 MED ORDER — SODIUM CHLORIDE 0.9 % IV SOLN: INTRAVENOUS | Status: DC

## 2017-08-04 MED ORDER — ONDANSETRON HCL 4 MG/2ML IJ SOLN
INTRAMUSCULAR | Status: AC
  Filled 2017-08-04: qty 2

## 2017-08-04 MED ORDER — MEPERIDINE HCL 100 MG/ML IJ SOLN
INTRAMUSCULAR | Status: AC
  Filled 2017-08-04: qty 2

## 2017-08-04 MED ORDER — LIDOCAINE VISCOUS 2 % MT SOLN
OROMUCOSAL | Status: AC
  Filled 2017-08-04: qty 15

## 2017-08-04 MED ORDER — MIDAZOLAM HCL 5 MG/5ML IJ SOLN
INTRAMUSCULAR | Status: AC
  Filled 2017-08-04: qty 10

## 2017-08-04 MED ORDER — ONDANSETRON HCL 4 MG/2ML IJ SOLN
INTRAMUSCULAR | Status: DC | PRN
  Administered 2017-08-04: 4 mg via INTRAVENOUS

## 2017-08-04 MED ORDER — MIDAZOLAM HCL 5 MG/5ML IJ SOLN
INTRAMUSCULAR | Status: DC | PRN
  Administered 2017-08-04: 2 mg via INTRAVENOUS
  Administered 2017-08-04: 1 mg via INTRAVENOUS
  Administered 2017-08-04: 2 mg via INTRAVENOUS
  Administered 2017-08-04: 1 mg via INTRAVENOUS

## 2017-08-04 MED ORDER — STERILE WATER FOR IRRIGATION IR SOLN
Status: DC | PRN
  Administered 2017-08-04: 08:00:00

## 2017-08-04 MED ORDER — LIDOCAINE VISCOUS 2 % MT SOLN
OROMUCOSAL | Status: DC | PRN
  Administered 2017-08-04: 4 mL via OROMUCOSAL

## 2017-08-04 MED ORDER — MEPERIDINE HCL 100 MG/ML IJ SOLN
INTRAMUSCULAR | Status: DC | PRN
Start: 2017-08-04 — End: 2017-08-04
  Administered 2017-08-04: 25 mg via INTRAVENOUS
  Administered 2017-08-04: 50 mg via INTRAVENOUS
  Administered 2017-08-04: 25 mg via INTRAVENOUS

## 2017-08-04 NOTE — Interval H&P Note (Signed)
History and Physical Interval Note:  08/04/2017 7:36 AM  Michael Harmon  has presented today for surgery, with the diagnosis of esophageal varices, cirrhosis  The various methods of treatment have been discussed with the patient and family. After consideration of risks, benefits and other options for treatment, the patient has consented to  Procedure(s) with comments: ESOPHAGOGASTRODUODENOSCOPY (EGD) (N/A) - 7:30am ESOPHAGEAL BANDING (N/A) - esophageal varices banding as a surgical intervention .  The patient's history has been reviewed, patient examined, no change in status, stable for surgery.  I have reviewed the patient's chart and labs.  Questions were answered to the patient's satisfaction.     Manus Rudd

## 2017-08-04 NOTE — Interval H&P Note (Signed)
History and Physical Interval Note:  08/04/2017 7:37 AM  Michael Harmon  has presented today for surgery, with the diagnosis of esophageal varices, cirrhosis  The various methods of treatment have been discussed with the patient and family. After consideration of risks, benefits and other options for treatment, the patient has consented to  Procedure(s) with comments: ESOPHAGOGASTRODUODENOSCOPY (EGD) (N/A) - 7:30am ESOPHAGEAL BANDING (N/A) - esophageal varices banding as a surgical intervention .  The patient's history has been reviewed, patient examined, no change in status, stable for surgery.  I have reviewed the patient's chart and labs.  Questions were answered to the patient's satisfaction.     Manus Rudd

## 2017-08-04 NOTE — H&P (View-Only) (Signed)
Additional lab orders have been added and pt is on lab recall for Feb 2019

## 2017-08-04 NOTE — H&P (View-Only) (Signed)
I noticed patient came off his PPI. Based on his prior EGD last year, Can we please ask he to restart his omeprazole 23m 30 minutes before breakfast. I sent in rx.

## 2017-08-04 NOTE — Op Note (Signed)
[facility] Patient Name: Michael Harmon Procedure Date: 08/04/2017 7:15 AM MRN: [id number] Date of Birth: Aug 04, 1961 Attending MD: Norvel Richards , MD CSN: [id number] Age: 56 Admit Type: Outpatient Procedure:                Upper GI endoscopy Indications:              Therapeutic procedure Providers:                Norvel Richards, MD, Jeanann Lewandowsky. Gwenlyn Perking RN, RN,                            Randa Spike, Technician Referring MD:              Medicines:                Midazolam 6 mg IV, Meperidine 100 mg IV,                            Ondansetron mg 4 IV Complications:            No immediate complications. Estimated Blood Loss:     Estimated blood loss was minimal. Procedure:                Pre-Anesthesia Assessment:                           - Prior to the procedure, a History and Physical                            was performed, and patient medications and                            allergies were reviewed. The patient's tolerance of                            previous anesthesia was also reviewed. The risks                            and benefits of the procedure and the sedation                            options and risks were discussed with the patient.                            All questions were answered, and informed consent                            was obtained. Prior Anticoagulants: The patient has                            taken no previous anticoagulant or antiplatelet                            agents. ASA Grade Assessment: III - A patient with  severe systemic disease. After reviewing the risks                            and benefits, the patient was deemed in                            satisfactory condition to undergo the procedure.                           After obtaining informed consent, the endoscope was                            passed under direct vision. Throughout the                            procedure, the  patient's blood pressure, pulse, and                            oxygen saturations were monitored continuously. The                            EG-[id number] ([id number]) scope was introduced through the                            mouth, and advanced to the second part of duodenum.                            The upper GI endoscopy was accomplished without                            difficulty. The patient tolerated the procedure                            well. Scope In: 7:53:55 AM Scope Out: 8:06:24 AM Total Procedure Duration: 0 hours 12 minutes 29 seconds  Findings:      Varices were found in the esophagus. 4 columns grade 2-3 without       bleeding stigmata. Undulating Z line. Esophageal mucosa otherwise       appeared normal. Scope removed. The 7 shot bander applied to scope and       reintroduced. A total of 5 bands placed on 4 variceal columns without       bleeding or other apparent complication. Banding was attempted with       incomplete eradication of varices.      Moderate portal hypertensive gastropathy was found in the stomach.      The duodenal bulb and second portion of the duodenum were normal. Impression:               - Esophageal varices. Incompletely eradicated -                            Portal hypertensive gastropathy.                           - Normal duodenal bulb and second portion of the  duodenum.                           - No specimens collected. Moderate Sedation:      Moderate (conscious) sedation was administered by the endoscopy nurse       and supervised by the endoscopist. The following parameters were       monitored: oxygen saturation, heart rate, blood pressure, respiratory       rate, EKG, adequacy of pulmonary ventilation, and response to care.       Total physician intraservice time was 24 minutes. Recommendation:           - Patient has a contact number available for                            emergencies. The signs and  symptoms of potential                            delayed complications were discussed with the                            patient. Return to normal activities tomorrow.                            Written discharge instructions were provided to the                            patient.                           - Resume previous diet.                           - Continue present medications.                           - Repeat upper endoscopy in 6 weeks for                            retreatment. carafate suspn 1 g 4 times a day for 5                            days as needed for discomfort.                           - Return to GI clinic in 1 month. Procedure Code(s):        --- Professional ---                           [phone], Esophagogastroduodenoscopy, flexible,                            transoral; with band ligation of esophageal/gastric                            varices  [id number], Moderate sedation services provided by the                            same physician or other qualified health care                            professional performing the diagnostic or                            therapeutic service that the sedation supports,                            requiring the presence of an independent trained                            observer to assist in the monitoring of the                            patient's level of consciousness and physiological                            status; initial 15 minutes of intraservice time,                            patient age 69 years or older                           [phone], Moderate sedation services; each additional                            15 minutes intraservice time Diagnosis Code(s):        --- Professional ---                           I85.00, Esophageal varices without bleeding                           K76.6, Portal hypertension                           K31.89, Other diseases of stomach and duodenum CPT  copyright 2016 American Medical Association. All rights reserved. The codes documented in this report are preliminary and upon coder review may  be revised to meet current compliance requirements. [name]. Laurencia Roma, MD Norvel Richards, MD 08/04/2017 8:24:36 AM This report has been signed electronically. Number of Addenda: 0

## 2017-08-04 NOTE — Discharge Instructions (Signed)
EGD Discharge instructions Please read the instructions outlined below and refer to this sheet in the next few weeks. These discharge instructions provide you with general information on caring for yourself after you leave the hospital. Your doctor may also give you specific instructions. While your treatment has been planned according to the most current medical practices available, unavoidable complications occasionally occur. If you have any problems or questions after discharge, please call your doctor. ACTIVITY  You may resume your regular activity but move at a slower pace for the next 24 hours.   Take frequent rest periods for the next 24 hours.   Walking will help expel (get rid of) the air and reduce the bloated feeling in your abdomen.   No driving for 24 hours (because of the anesthesia (medicine) used during the test).   You may shower.   Do not sign any important legal documents or operate any machinery for 24 hours (because of the anesthesia used during the test).  NUTRITION  Drink plenty of fluids.   You may resume your normal diet.   Begin with a light meal and progress to your normal diet.   Avoid alcoholic beverages for 24 hours or as instructed by your caregiver.  MEDICATIONS  You may resume your normal medications unless your caregiver tells you otherwise.  WHAT YOU CAN EXPECT TODAY  You may experience abdominal discomfort such as a feeling of fullness or gas pains.  FOLLOW-UP  Your doctor will discuss the results of your test with you.  SEEK IMMEDIATE MEDICAL ATTENTION IF ANY OF THE FOLLOWING OCCUR:  Excessive nausea (feeling sick to your stomach) and/or vomiting.   Severe abdominal pain and distention (swelling).   Trouble swallowing.   Temperature over 101 F (37.8 C).   Rectal bleeding or vomiting of blood.     Office visit in 1 month set up repeat banding  Carafate suspension 4 times daily as needed for transient chest discomfort  Hold  up on taking omeprazole for now

## 2017-08-04 NOTE — Interval H&P Note (Signed)
History and Physical Interval Note:  08/04/2017 7:38 AM  Michael Harmon  has presented today for surgery, with the diagnosis of esophageal varices, cirrhosis  The various methods of treatment have been discussed with the patient and family. After consideration of risks, benefits and other options for treatment, the patient has consented to  Procedure(s) with comments: ESOPHAGOGASTRODUODENOSCOPY (EGD) (N/A) - 7:30am ESOPHAGEAL BANDING (N/A) - esophageal varices banding as a surgical intervention .  The patient's history has been reviewed, patient examined, no change in status, stable for surgery.  I have reviewed the patient's chart and labs.  Questions were answered to the patient's satisfaction.     Manus Rudd

## 2017-08-04 NOTE — H&P (View-Only) (Signed)
Please update NICs.  For labs: postpone 08/2017 labs until February 2019. He will need TTG, IgA, CBC, PT/INR, CMET, AFP tumor marker.   For Abd u/s: he will need in 08/2017. DX: hepatoma surveillance, right renal cyst. PLEASE MAKE SURE THIS IS SCHEDULED FOR SOMETIME IN 08/2017 DUE TO PATIENT REQUEST.

## 2017-08-09 ENCOUNTER — Encounter (HOSPITAL_COMMUNITY): Payer: Self-pay | Admitting: Internal Medicine

## 2017-08-12 NOTE — Progress Notes (Signed)
No hepatoma. Given evidence of portal hypertension/esphageal varices at time of EGD, suspicious of cirrhosis.  Continue ruq u/s in 6 months.

## 2017-08-16 NOTE — Progress Notes (Signed)
ON RECALL  °

## 2017-09-03 ENCOUNTER — Encounter: Payer: Self-pay | Admitting: Gastroenterology

## 2017-09-03 ENCOUNTER — Encounter: Payer: Self-pay | Admitting: *Deleted

## 2017-09-03 ENCOUNTER — Other Ambulatory Visit: Payer: Self-pay | Admitting: *Deleted

## 2017-09-03 ENCOUNTER — Ambulatory Visit (INDEPENDENT_AMBULATORY_CARE_PROVIDER_SITE_OTHER): Payer: BLUE CROSS/BLUE SHIELD | Admitting: Gastroenterology

## 2017-09-03 VITALS — BP 119/70 | HR 63 | Temp 97.0°F | Ht 67.0 in | Wt 230.8 lb

## 2017-09-03 DIAGNOSIS — I8500 Esophageal varices without bleeding: Secondary | ICD-10-CM

## 2017-09-03 DIAGNOSIS — K7460 Unspecified cirrhosis of liver: Secondary | ICD-10-CM

## 2017-09-03 DIAGNOSIS — N281 Cyst of kidney, acquired: Secondary | ICD-10-CM

## 2017-09-03 DIAGNOSIS — K900 Celiac disease: Secondary | ICD-10-CM

## 2017-09-03 HISTORY — DX: Cyst of kidney, acquired: N28.1

## 2017-09-03 NOTE — Assessment & Plan Note (Signed)
Clinically doing well but admits to gluten exposure.  Encouraged sticking with the diet.  Plan to update labs in February.

## 2017-09-03 NOTE — Progress Notes (Signed)
Primary Care Physician: Raylene Everts, MD  Primary Gastroenterologist:  Garfield Cornea, MD   Chief Complaint  Patient presents with  . Follow-up    1 month pp. Doing okay    HPI: Michael Harmon is a 56 y.o. male here for follow-up of recent EGD with esophageal variceal banding.  He had 4 columns of grade 2-3 varices, 5 bands placed.  Incomplete eradication.  Portal hypertensive gastropathy noted as well.  Plans to bring him back in 6 weeks for repeat EV banding.  Clinically he is doing well.  Admits to some gluten exposure in his diet.  Bowel function is unremarkable.  No blood in the stool or melena.  Denies abdominal pain.  No heartburn, dysphagia, odynophagia.  Plans to go on a 12-day cruise starting November 10 and would like his repeat EGD to be after he comes back.  He has concerns about enlarging renal cyst.  Not reported on October 2017 ultrasound.  Was 1.6 cm in April and now over 2 cm in October.   Current Outpatient Prescriptions  Medication Sig Dispense Refill  . acetaminophen (TYLENOL) 500 MG tablet Take 500 mg by mouth every 6 (six) hours as needed.    Marland Kitchen atorvastatin (LIPITOR) 20 MG tablet Take 1 tablet (20 mg total) by mouth daily. 90 tablet 3  . benazepril-hydrochlorthiazide (LOTENSIN HCT) 10-12.5 MG tablet Take 1 tablet by mouth daily.    . cholecalciferol (VITAMIN D) 1000 units tablet Take 1,000 Units by mouth daily.     Marland Kitchen glimepiride (AMARYL) 2 MG tablet Take 1 tablet (2 mg total) by mouth 2 (two) times daily. 180 tablet 3  . multivitamin (ONE-A-DAY MEN'S) TABS tablet Take 1 tablet by mouth daily.     No current facility-administered medications for this visit.     Allergies as of 09/03/2017 - Review Complete 09/03/2017  Allergen Reaction Noted  . Penicillins Other (See Comments) 04/07/2016  . Clarithromycin Tinitus    Past Medical History:  Diagnosis Date  . Allergy    nose and sinus problems  . Anemia   . Asthma   . Celiac disease   .  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 DM (diabetes mellitus) ([place])   . GERD (gastroesophageal reflux disease)   . Hyperlipidemia    diet controlled now with 100 pound weight loss  . Hypertension   . Situational depression 07/06/2017  . Splenomegaly   . Thrombocytopenia ([place])    hematology, ?related to chol med (tricor) and glimeripide, just being monitored, above 100,000   Past Surgical History:  Procedure Laterality Date  . BIOPSY  07/09/2016   Procedure: BIOPSY;  Surgeon: [name], MD;  [facility];  Service: Endoscopy;;  duodenal, gastric, esophaageal  . CLOSED MANIPULATION SHOULDER    . COLONOSCOPY  11/2015   Dr. Posey Pronto: cecal bx neg for microscopic colitis. ascending colon polyp (adenomatous)  . ESOPHAGEAL BANDING N/A 08/04/2017   Procedure: ESOPHAGEAL BANDING;  Surgeon: [name], MD;  [facility];  Service: Endoscopy;  Laterality: N/A;  esophageal varices banding  . ESOPHAGOGASTRODUODENOSCOPY N/A 07/09/2016   Procedure: ESOPHAGOGASTRODUODENOSCOPY (EGD);  Surgeon: [name], MD;  [facility];  Service: Endoscopy;  Laterality: N/A;  730  . ESOPHAGOGASTRODUODENOSCOPY N/A 08/04/2017   Procedure: ESOPHAGOGASTRODUODENOSCOPY (EGD);  Surgeon: [name], MD;  [facility];  Service: Endoscopy;  Laterality: N/A;  7:30am   Family History  Problem Relation Age of Onset  .  Other Mother        chronic diarrhea  . COPD Mother   . Heart disease Mother        died at 3  . Arthritis Mother   . Cancer Mother   . Depression Mother   . Diabetes Mother   . Hyperlipidemia Mother   . Hypertension Mother   . Stroke Mother   . Arthritis Father   . Asthma Father   . Birth defects Father   . Heart disease Father        aortic valve replaced  . Heart disease Maternal Grandmother   . Stroke Maternal Grandmother   . Heart disease Maternal Grandfather   . Stroke Maternal Grandfather   . Diabetes Paternal Grandmother   . Stroke Paternal  Grandfather   . Heart disease Paternal Grandfather   . Liver disease Neg Hx   . Colon cancer Neg Hx    Social History   Social History  . Marital status: Married    Spouse name: Melissa  . Number of children: 0  . Years of education: 14   Occupational History  . Customer service manager, [employer], travels    Social History Main Topics  . Smoking status: Never Smoker  . Smokeless tobacco: Never Used  . Alcohol use 0.0 oz/week     Comment: rare, 1-2 drinks a month, on vacation  . Drug use: No  . Sexual activity: Not Currently   Other Topics Concern  . None   Social History Narrative   Lives at home with wife Melissa   One dog       ROS:  General: Negative for anorexia, weight loss, fever, chills, fatigue, weakness. ENT: Negative for hoarseness, difficulty swallowing , nasal congestion. CV: Negative for chest pain, angina, palpitations, dyspnea on exertion, peripheral edema.  Respiratory: Negative for dyspnea at rest, dyspnea on exertion, cough, sputum, wheezing.  GI: See history of present illness. GU:  Negative for dysuria, hematuria, urinary incontinence, urinary frequency, nocturnal urination.  Endo: Negative for unusual weight change.    Physical Examination:   BP 119/70   Pulse 63   Temp (!) 97 F (36.1 C) (Oral)   Ht 5' 7"  (1.702 m)   Wt 230 lb 12.8 oz (104.7 kg)   BMI 36.15 kg/m   General: Well-nourished, well-developed in no acute distress.  Eyes: No icterus. Mouth: Oropharyngeal mucosa moist and pink , no lesions erythema or exudate. Lungs: Clear to auscultation bilaterally.  Heart: Regular rate and rhythm, no murmurs rubs or gallops.  Abdomen: Bowel sounds are normal, nontender, nondistended, no hepatosplenomegaly or masses, no abdominal bruits or hernia , no rebound or guarding.  Rectus diastases Extremities: No lower extremity edema. No clubbing or deformities. Neuro: Alert and oriented x 4   Skin: Warm and dry, no jaundice.   Psych:  Alert and cooperative, normal mood and affect.  Labs:  Lab Results  Component Value Date   WBC 4.6 07/06/2017   HGB 14.6 07/06/2017   HCT 43.4 07/06/2017   MCV 90.8 07/06/2017   PLT 112 (L) 07/06/2017   Lab Results  Component Value Date   CREATININE 1.11 07/06/2017   BUN 22 07/06/2017   NA 136 07/06/2017   K 5.2 07/06/2017   CL 104 07/06/2017   CO2 21 07/06/2017   Lab Results  Component Value Date   INR 1.0 02/04/2017   INR 1.0 08/06/2016   INR 1.0 05/19/2016   Lab Results  Component Value Date   ALT  35 07/06/2017   AST 31 07/06/2017   ALKPHOS 87 07/06/2017   BILITOT 0.6 07/06/2017    Imaging Studies: No results found.

## 2017-09-03 NOTE — Assessment & Plan Note (Signed)
Right renal cyst not reported on ultrasound in 2017 but reported in April and October of this year.  Nearly double in size.  Appears to be simple cyst but will review with radiologist given change in size. May need further imaging via MRI.

## 2017-09-03 NOTE — Assessment & Plan Note (Signed)
Currently up-to-date on ultrasound.  Plan for labs in February.  He has been well compensated.  Completed hepatitis a and B vaccinations previously.

## 2017-09-03 NOTE — Patient Instructions (Signed)
1. Upper endoscopy as scheduled.  Please see separate instructions. 2. Labs due in February 2019. 3. We will review ultrasounds with radiologist regarding right renal cyst.

## 2017-09-03 NOTE — Assessment & Plan Note (Signed)
EGD with esophageal variceal banding approximately 4 weeks ago.  Recommended repeat banding in 6 weeks from last procedure.  Patient will be on a cruise and wants to postpone until he comes back.  Plan for EGD with esophageal variceal banding the week after Thanksgiving.  I have discussed the risks, alternatives, benefits with regards to but not limited to the risk of reaction to medication, bleeding, infection, perforation and the patient is agreeable to proceed. Written consent to be obtained.

## 2017-09-03 NOTE — Progress Notes (Signed)
cc'ed to pcp °

## 2017-09-07 ENCOUNTER — Encounter: Payer: Self-pay | Admitting: Family Medicine

## 2017-09-07 ENCOUNTER — Ambulatory Visit: Payer: BLUE CROSS/BLUE SHIELD | Admitting: Family Medicine

## 2017-09-07 VITALS — BP 114/64 | HR 72 | Temp 98.1°F | Resp 18 | Ht 67.0 in | Wt 228.1 lb

## 2017-09-07 DIAGNOSIS — K7469 Other cirrhosis of liver: Secondary | ICD-10-CM

## 2017-09-07 DIAGNOSIS — I10 Essential (primary) hypertension: Secondary | ICD-10-CM

## 2017-09-07 DIAGNOSIS — E785 Hyperlipidemia, unspecified: Secondary | ICD-10-CM | POA: Diagnosis not present

## 2017-09-07 DIAGNOSIS — E119 Type 2 diabetes mellitus without complications: Secondary | ICD-10-CM

## 2017-09-07 MED ORDER — HYDROCHLOROTHIAZIDE 12.5 MG PO CAPS: 12.5000 mg | ORAL_CAPSULE | Freq: Every day | ORAL | 3 refills | Status: DC

## 2017-09-07 NOTE — Progress Notes (Signed)
Chief Complaint  Patient presents with  . Follow-up    2 month  Patient is here for routine follow-up. He had an EGD and banding of his esophageal cirrhosis varices recently.  This was successful.  He has another EGD scheduled for 3 months from now. He is leaving next week for a cruise vacation.  He will get his flu shot when he comes back He has been able to lose a couple of pounds but not as much as he would like.  He does not get enough exercise and this is discussed. His last blood work was in September.  His hemoglobin A1c is 7.9.  At that time I increased his Amaryl from 1 mg a day to 2 mg a day.  He is due for another hemoglobin A1c in a couple of months.  He states he feels well.  No high or low sugars to report. His blood pressure is well controlled. He does see podiatry. His eye exam is up-to-date. He does have difficulty with his diabetes diet because of his celiac disease.  He is gluten intolerant.  He has very limited ability to eat carbohydrates.  He treats himself with ice cream more than he should.  I again offered him a referral to a nutritionist but he insists that he knows what to eat, just does not have good willpower. He has a renal cyst on his last 2 ultrasounds.  It is questionably enlarged.  He worries about this.  I explained to him the benign nature of renal cysts in general, and that it is being followed adequately with ultrasounds every 6 months.  Patient Active Problem List   Diagnosis Date Noted  . Renal cyst 09/03/2017  . Essential hypertension 07/06/2017  . Type 2 diabetes mellitus without complication, without long-term current use of insulin ([place]) 07/06/2017  . HLD (hyperlipidemia) 07/06/2017  . Environmental allergies 07/06/2017  . BPH with obstruction/lower urinary tract symptoms 07/06/2017  . Situational depression 07/06/2017  . Esophageal varices determined by endoscopy ([place]) 07/06/2017  . Portal hypertension ([place]) 06/24/2017  . Vitamin D  deficiency 08/06/2016  . Hepatic cirrhosis ([place]) 08/06/2016  . Hallux valgus 07/14/2016  . Thrombocytopenia ([place]) 04/07/2016  . Adult form of celiac disease 04/07/2016  . Hx of adenomatous colonic polyps 04/07/2016    Outpatient Encounter Medications as of 09/07/2017  Medication Sig  . acetaminophen (TYLENOL) 500 MG tablet Take 500 mg by mouth every 6 (six) hours as needed.  Marland Kitchen atorvastatin (LIPITOR) 20 MG tablet Take 1 tablet (20 mg total) by mouth daily.  . benazepril-hydrochlorthiazide (LOTENSIN HCT) 10-12.5 MG tablet Take by mouth.  . cholecalciferol (VITAMIN D) 1000 units tablet Take 1,000 Units by mouth daily.   Marland Kitchen glimepiride (AMARYL) 2 MG tablet Take 1 tablet (2 mg total) by mouth 2 (two) times daily.  . multivitamin (ONE-A-DAY MEN'S) TABS tablet Take 1 tablet by mouth daily.  Marland Kitchen CARAFATE 1 GM/10ML suspension TAKE 10 ML (1GRAM) BY MOUTH 4 TIMES DAILY AS NEEDED FOR CHEST DISCOMFORT  . omeprazole (PRILOSEC) 20 MG capsule TAKE ONE CAPSULE BY MOUTH EVERY MORNING BEFORE BREAKFAST   No facility-administered encounter medications on file as of 09/07/2017.     Allergies  Allergen Reactions  . Penicillins Other (See Comments)    As a baby, unknown reaction Has patient had a PCN reaction causing immediate rash, facial/tongue/throat swelling, SOB or lightheadedness with hypotension: Unknown Has patient had a PCN reaction causing severe rash involving mucus membranes or skin necrosis: Unknown  Has patient had a PCN reaction that required hospitalization: Unknown Has patient had a PCN reaction occurring within the last 10 years: No If all of the above answers are "NO", then may proceed with Cephalosporin use.   . Clarithromycin Tinitus    Ears ringing, loss sense of smell    Review of Systems  Constitutional: Negative for activity change, appetite change, fatigue and unexpected weight change.  HENT: Negative for congestion and dental problem.   Eyes: Negative for photophobia and visual  disturbance.  Respiratory: Negative for cough and shortness of breath.   Cardiovascular: Negative for chest pain, palpitations and leg swelling.  Gastrointestinal: Negative for blood in stool, constipation, diarrhea and vomiting.  Endocrine: Negative for polyphagia and polyuria.  Genitourinary: Negative for difficulty urinating and hematuria.  Musculoskeletal: Negative for arthralgias, back pain and gait problem.  Skin: Negative for color change.  Neurological: Negative for light-headedness and headaches.  Hematological: Does not bruise/bleed easily.  Psychiatric/Behavioral: Negative for dysphoric mood. The patient is not nervous/anxious.   No acute complaints    BP 114/64 (BP Location: Right Arm, Patient Position: Sitting, Cuff Size: Large)   Pulse 72   Temp 98.1 F (36.7 C) (Temporal)   Resp 18   Ht 5' 7"  (1.702 m)   Wt 228 lb 1.9 oz (103.5 kg)   SpO2 96%   BMI 35.73 kg/m   Physical Exam  Constitutional: He is oriented to person, place, and time. He appears well-developed and well-nourished. No distress.  HENT:  Head: Normocephalic and atraumatic.  Mouth/Throat: Oropharynx is clear and moist.  Eyes: Conjunctivae are normal. Pupils are equal, round, and reactive to light.  Neck: Normal range of motion. Neck supple.  Cardiovascular: Normal rate, regular rhythm and normal heart sounds.  Pulmonary/Chest: Effort normal and breath sounds normal. No respiratory distress.  Abdominal: Soft. Bowel sounds are normal.  Protuberant, excess adiposity around midline  Musculoskeletal: Normal range of motion. He exhibits no edema.  Neurological: He is alert and oriented to person, place, and time.  Gait normal  Skin: Skin is warm and dry.  Psychiatric: He has a normal mood and affect. His behavior is normal. Thought content normal.  Nursing note and vitals reviewed.   ASSESSMENT/PLAN:  1. Essential hypertension Well-controlled  2. Type 2 diabetes mellitus without complication,  without long-term current use of insulin ([facility]) Not well controlled.  Recent change in medicine should improve A1c.  Dietary compliance is reviewed. - COMPLETE METABOLIC PANEL WITH GFR - Hemoglobin A1c - Lipid panel - Urinalysis, Routine w reflex microscopic - CBC with Differential/Platelet  3. Hyperlipidemia, unspecified hyperlipidemia type Compliant with statin.  His last LDL was 130.  His atorvastatin was increased.  We will follow  4. 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   Patient Instructions  Watch the diet Walk every day that you are able Labs are due before next visit Come back for the flu shot after your cruise See me in 3 months    Raylene Everts, MD

## 2017-09-07 NOTE — Patient Instructions (Signed)
Watch the diet Walk every day that you are able Labs are due before next visit Come back for the flu shot after your cruise See me in 3 months

## 2017-10-13 ENCOUNTER — Encounter (HOSPITAL_COMMUNITY)
Admission: RE | Disposition: A | Discharge status: Home / Self Care | Payer: Self-pay | Source: Ambulatory Visit | Attending: Internal Medicine

## 2017-10-13 ENCOUNTER — Ambulatory Visit (HOSPITAL_COMMUNITY)
Admission: RE | Admit: 2017-10-13 | Discharge: 2017-10-13 | Disposition: A | Discharge status: Home / Self Care | Payer: BLUE CROSS/BLUE SHIELD | Source: Ambulatory Visit | Attending: Internal Medicine | Admitting: Internal Medicine

## 2017-10-13 ENCOUNTER — Other Ambulatory Visit: Payer: Self-pay

## 2017-10-13 ENCOUNTER — Encounter (HOSPITAL_COMMUNITY): Payer: Self-pay | Admitting: *Deleted

## 2017-10-13 DIAGNOSIS — K766 Portal hypertension: Secondary | ICD-10-CM | POA: Insufficient documentation

## 2017-10-13 DIAGNOSIS — I8500 Esophageal varices without bleeding: Secondary | ICD-10-CM | POA: Diagnosis not present

## 2017-10-13 DIAGNOSIS — E785 Hyperlipidemia, unspecified: Secondary | ICD-10-CM | POA: Insufficient documentation

## 2017-10-13 DIAGNOSIS — I10 Essential (primary) hypertension: Secondary | ICD-10-CM | POA: Diagnosis not present

## 2017-10-13 DIAGNOSIS — K900 Celiac disease: Secondary | ICD-10-CM | POA: Diagnosis not present

## 2017-10-13 DIAGNOSIS — K3189 Other diseases of stomach and duodenum: Secondary | ICD-10-CM | POA: Insufficient documentation

## 2017-10-13 DIAGNOSIS — Z79899 Other long term (current) drug therapy: Secondary | ICD-10-CM | POA: Diagnosis not present

## 2017-10-13 DIAGNOSIS — K7469 Other cirrhosis of liver: Secondary | ICD-10-CM | POA: Insufficient documentation

## 2017-10-13 DIAGNOSIS — I8510 Secondary esophageal varices without bleeding: Secondary | ICD-10-CM | POA: Diagnosis not present

## 2017-10-13 DIAGNOSIS — E119 Type 2 diabetes mellitus without complications: Secondary | ICD-10-CM | POA: Insufficient documentation

## 2017-10-13 HISTORY — PX: ESOPHAGEAL BANDING: SHX5518

## 2017-10-13 HISTORY — PX: ESOPHAGOGASTRODUODENOSCOPY: SHX5428

## 2017-10-13 LAB — GLUCOSE, CAPILLARY: GLUCOSE-CAPILLARY: 147 mg/dL — AB (ref 65–99)

## 2017-10-13 SURGERY — EGD (ESOPHAGOGASTRODUODENOSCOPY)
Anesthesia: Moderate Sedation

## 2017-10-13 MED ORDER — MIDAZOLAM HCL 5 MG/5ML IJ SOLN
INTRAMUSCULAR | Status: AC
  Filled 2017-10-13: qty 10

## 2017-10-13 MED ORDER — LIDOCAINE VISCOUS 2 % MT SOLN
OROMUCOSAL | Status: AC
  Filled 2017-10-13: qty 15

## 2017-10-13 MED ORDER — SODIUM CHLORIDE 0.9 % IV SOLN
INTRAVENOUS | Status: DC
  Administered 2017-10-13: 09:00:00 via INTRAVENOUS

## 2017-10-13 MED ORDER — MEPERIDINE HCL 100 MG/ML IJ SOLN
INTRAMUSCULAR | Status: DC | PRN
  Administered 2017-10-13 (×2): 25 mg via INTRAVENOUS
  Administered 2017-10-13: 50 mg via INTRAVENOUS

## 2017-10-13 MED ORDER — ONDANSETRON HCL 4 MG/2ML IJ SOLN
INTRAMUSCULAR | Status: DC | PRN
  Administered 2017-10-13: 4 mg via INTRAVENOUS

## 2017-10-13 MED ORDER — SIMETHICONE 40 MG/0.6ML PO SUSP
ORAL | Status: DC | PRN
  Administered 2017-10-13: 09:00:00

## 2017-10-13 MED ORDER — MEPERIDINE HCL 100 MG/ML IJ SOLN
INTRAMUSCULAR | Status: AC
  Filled 2017-10-13: qty 2

## 2017-10-13 MED ORDER — MIDAZOLAM HCL 5 MG/5ML IJ SOLN
INTRAMUSCULAR | Status: DC | PRN
  Administered 2017-10-13: 2 mg via INTRAVENOUS
  Administered 2017-10-13 (×2): 1 mg via INTRAVENOUS
  Administered 2017-10-13: 2 mg via INTRAVENOUS

## 2017-10-13 MED ORDER — ONDANSETRON HCL 4 MG/2ML IJ SOLN
INTRAMUSCULAR | Status: AC
  Filled 2017-10-13: qty 2

## 2017-10-13 MED ORDER — LIDOCAINE VISCOUS 2 % MT SOLN
OROMUCOSAL | Status: DC | PRN
  Administered 2017-10-13: 4 mL via OROMUCOSAL

## 2017-10-13 NOTE — H&P (Signed)
@LOGO @   Primary Care Physician:  Raylene Everts, MD Primary Gastroenterologist:  Dr. [name]  Pre-Procedure History & Physical: HPI:  Michael Harmon is a 56 y.o. male here for EBL. First round of several weeks ago. He's done well.  Past Medical History:  Diagnosis Date  . Allergy    nose and sinus problems  . Anemia   . Asthma   . Celiac disease   . Cirrhosis, cryptogenic ([place])   . DM (diabetes mellitus) ([place])   . GERD (gastroesophageal reflux disease)   . Hyperlipidemia    diet controlled now with 100 pound weight loss  . Hypertension   . Renal cyst 09/03/2017  . Situational depression 07/06/2017  . Splenomegaly   . Thrombocytopenia ([place])    hematology, ?related to chol med (tricor) and glimeripide, just being monitored, above 100,000    Past Surgical History:  Procedure Laterality Date  . BIOPSY  07/09/2016   Procedure: BIOPSY;  Surgeon: [name], MD;  Location[facility];  Service: Endoscopy;;  duodenal, gastric, esophaageal  . CLOSED MANIPULATION SHOULDER    . COLONOSCOPY  11/2015   Dr. Posey Pronto: cecal bx neg for microscopic colitis. ascending colon polyp (adenomatous)  . ESOPHAGEAL BANDING N/A 08/04/2017   Procedure: ESOPHAGEAL BANDING;  Surgeon: [name], MD;  Location: [facility];  Service: Endoscopy;  Laterality: N/A;  esophageal varices banding  . ESOPHAGOGASTRODUODENOSCOPY N/A 07/09/2016   Procedure: ESOPHAGOGASTRODUODENOSCOPY (EGD);  Surgeon: [name], MD;  Location: [facility];  Service: Endoscopy;  Laterality: N/A;  730  . ESOPHAGOGASTRODUODENOSCOPY N/A 08/04/2017   Procedure: ESOPHAGOGASTRODUODENOSCOPY (EGD);  Surgeon: [name], MD;  Location: [facility];  Service: Endoscopy;  Laterality: N/A;  7:30am    Prior to Admission medications   Medication Sig Start Date End Date Taking? Authorizing Provider  acetaminophen (TYLENOL) 500 MG tablet Take 1,000 mg by mouth daily as needed for moderate pain or headache.    Yes [provider]  atorvastatin (LIPITOR) 20 MG tablet Take 1 tablet (20 mg total) by mouth daily. 07/09/17  Yes Raylene Everts, MD  benazepril-hydrochlorthiazide (LOTENSIN HCT) 10-12.5 MG tablet Take 1 tablet by mouth daily.  06/13/17  Yes [provider]  Ca Carbonate-Mag Hydroxide (ROLAIDS PO) Take 1-2 tablets by mouth daily as needed (indigestion).   Yes [provider]  cholecalciferol (VITAMIN D) 1000 units tablet Take 1,000 Units by mouth daily.    Yes [provider]  glimepiride (AMARYL) 2 MG tablet Take 1 tablet (2 mg total) by mouth 2 (two) times daily. 07/09/17  Yes Raylene Everts, MD  multivitamin (ONE-A-DAY MEN'S) TABS tablet Take 1 tablet by mouth daily.   Yes [provider]  neomycin-bacitracin-polymyxin (NEOSPORIN) ointment Apply 1 application topically as needed for wound care.   Yes [provider]    Allergies as of 09/03/2017 - Review Complete 09/03/2017  Allergen Reaction Noted  . Penicillins Other (See Comments) 04/07/2016  . Clarithromycin Tinitus     Family History  Problem Relation Age of Onset  . Other Mother        chronic diarrhea  . COPD Mother   . Heart disease Mother        died at 12  . Arthritis Mother   . Cancer Mother   . Depression Mother   . Diabetes Mother   . Hyperlipidemia Mother   . Hypertension Mother   . Stroke Mother   . Arthritis Father   .  Asthma Father   . Birth defects Father   . Heart disease Father        aortic valve replaced  . Heart disease Maternal Grandmother   . Stroke Maternal Grandmother   . Heart disease Maternal Grandfather   . Stroke Maternal Grandfather   . Diabetes Paternal Grandmother   . Stroke Paternal Grandfather   . Heart disease Paternal Grandfather   . Liver disease Neg Hx   . Colon cancer Neg Hx     Social History   Socioeconomic History  . Marital status: Married    Spouse name: Melissa  . Number of children: 0  . Years of education: 25  . Highest  education level: Not on file  Social Needs  . Financial resource strain: Not on file  . Food insecurity - worry: Not on file  . Food insecurity - inability: Not on file  . Transportation needs - medical: Not on file  . Transportation needs - non-medical: Not on file  Occupational History  . Occupation: Customer service manager, [employer], travels  Tobacco Use  . Smoking status: Never Smoker  . Smokeless tobacco: Never Used  Substance and Sexual Activity  . Alcohol use: Yes    Alcohol/week: 0.0 oz    Comment: rare, 1-2 drinks a month, on vacation  . Drug use: No  . Sexual activity: Not Currently  Other Topics Concern  . Not on file  Social History Narrative   Lives at home with wife Melissa   One dog    Review of Systems: See HPI, otherwise negative ROS  Physical Exam: BP 119/66   Pulse 74   Temp 98.1 F (36.7 C) (Oral)   Resp 15   Ht 5' 7"  (1.702 m)   Wt 228 lb (103.4 kg)   SpO2 99%   BMI 35.71 kg/m  General:   Alert,  Well-developed, well-nourished, pleasant and cooperative in NAD Neck:  Supple; no masses or thyromegaly. No significant cervical adenopathy. Lungs:  Clear throughout to auscultation.   No wheezes, crackles, or rhonchi. No acute distress. Heart:  Regular rate and rhythm; no murmurs, clicks, rubs,  or gallops. Abdomen: Non-distended, normal bowel sounds.  Soft and nontender without appreciable mass or hepatosplenomegaly.  Pulses:  Normal pulses noted. Extremities:  Without clubbing or edema.  Impression:  Pleasant 56 year old gentleman with cirrhosis. History of esophageal varices status post one prior banding session. He is here for follow-up session possible repeat banding per plan.  Recommendations:  I have offered the patient EGD with the EBL per plan.    The risks, benefits, limitations, alternatives and imponderables have been reviewed with the patient., biopsy, etc. have also been reviewed.  Questions have been answered. All parties  agreeable.        Notice: This dictation was prepared with Dragon dictation along with smaller phrase technology. Any transcriptional errors that result from this process are unintentional and may not be corrected upon review.

## 2017-10-13 NOTE — Op Note (Signed)
[facility] Patient Name: Michael Harmon Procedure Date: 10/13/2017 9:07 AM MRN: [id number] Date of Birth: Jul 13, 1961 Attending MD: Norvel Richards , MD CSN: [id number] Age: 56 Admit Type: Outpatient Procedure:                Upper GI endoscopy Indications:              For therapy of esophageal varices in patient with                            suspected portal hypertension, Surveillance                            procedure Providers:                Norvel Richards, MD, Otis Peak B. Gwenlyn Perking RN, RN,                            Randa Spike, Technician Referring MD:              Medicines:                Midazolam 6 mg IV, Meperidine 100 mg IV,                            Ondansetron 4 mg IV Complications:            No immediate complications. Estimated Blood Loss:     Estimated blood loss: none. Procedure:                Pre-Anesthesia Assessment:                           - Prior to the procedure, a History and Physical                            was performed, and patient medications and                            allergies were reviewed. The patient's tolerance of                            previous anesthesia was also reviewed. The risks                            and benefits of the procedure and the sedation                            options and risks were discussed with the patient.                            All questions were answered, and informed consent                            was obtained. Prior Anticoagulants: The patient has  taken no previous anticoagulant or antiplatelet                            agents. ASA Grade Assessment: II - A patient with                            mild systemic disease. After reviewing the risks                            and benefits, the patient was deemed in                            satisfactory condition to undergo the procedure.                           After obtaining informed consent, the  endoscope was                            passed under direct vision. Throughout the                            procedure, the patient's blood pressure, pulse, and                            oxygen saturations were monitored continuously. The                            EG-[id number] ([id number]) scope was introduced through the                            and advanced to the second part of duodenum. The                            upper GI endoscopy was accomplished without                            difficulty. The patient tolerated the procedure                            well. Scope In: 9:35:59 AM Scope Out: 9:45:58 AM Total Procedure Duration: 0 hours 9 minutes 59 seconds  Findings:      Grade 2 varices were found in the esophagus. 4 columns. No bleeding       stigmata.      Portal hypertensive gastropathy was found in the entire examined stomach.      The duodenal bulb and second portion of the duodenum were normal. 7 shot       bander loaded onto the gastroscope. Scope reintroduced into the       esophagus.One band placed on each column. This was done effectively       without apparent complication. Impression:               - Grade 2 esophageal varices.                           -  Portal hypertensive gastropathy.                           - Normal duodenal bulb and second portion of the                            duodenum.                           - No specimens collected. Moderate Sedation:      Moderate (conscious) sedation was personally administered by the       endoscopist. The following parameters were monitored: oxygen saturation,       heart rate, blood pressure, and response to care. Total physician       intraservice time was 17 minutes. Recommendation:           - Patient has a contact number available for                            emergencies. The signs and symptoms of potential                            delayed complications were discussed with the                             patient. Return to normal activities tomorrow.                            Written discharge instructions were provided to the                            patient.                           - Resume previous diet.                           - Continue present medications.                           - No repeat upper endoscopy.                           - Return to GI office in 3 months. Procedure Code(s):        --- Professional ---                           [phone], Esophagogastroduodenoscopy, flexible,                            transoral; diagnostic, including collection of                            specimen(s) by brushing or washing, when performed                            (separate procedure)  40814, Moderate sedation services provided by the                            same physician or other qualified health care                            professional performing the diagnostic or                            therapeutic service that the sedation supports,                            requiring the presence of an independent trained                            observer to assist in the monitoring of the                            patient's level of consciousness and physiological                            status; initial 15 minutes of intraservice time,                            patient age 48 years or older Diagnosis Code(s):        --- Professional ---                           I85.00, Esophageal varices without bleeding                           K76.6, Portal hypertension                           K31.89, Other diseases of stomach and duodenum CPT copyright 2016 American Medical Association. All rights reserved. The codes documented in this report are preliminary and upon coder review may  be revised to meet current compliance requirements. Michael Harmon. Michael Hannula, MD Norvel Richards, MD 10/13/2017 10:37:42 AM This report has been signed  electronically. Number of Addenda: 0

## 2017-10-13 NOTE — Discharge Instructions (Signed)
EGD Discharge instructions Please read the instructions outlined below and refer to this sheet in the next few weeks. These discharge instructions provide you with general information on caring for yourself after you leave the hospital. Your doctor may also give you specific instructions. While your treatment has been planned according to the most current medical practices available, unavoidable complications occasionally occur. If you have any problems or questions after discharge, please call your doctor. ACTIVITY  You may resume your regular activity but move at a slower pace for the next 24 hours.   Take frequent rest periods for the next 24 hours.   Walking will help expel (get rid of) the air and reduce the bloated feeling in your abdomen.   No driving for 24 hours (because of the anesthesia (medicine) used during the test).   You may shower.   Do not sign any important legal documents or operate any machinery for 24 hours (because of the anesthesia used during the test).  NUTRITION  Drink plenty of fluids.   You may resume your normal diet.   Begin with a light meal and progress to your normal diet.   Avoid alcoholic beverages for 24 hours or as instructed by your caregiver.  MEDICATIONS  You may resume your normal medications unless your caregiver tells you otherwise.  WHAT YOU CAN EXPECT TODAY  You may experience abdominal discomfort such as a feeling of fullness or gas pains.  FOLLOW-UP  Your doctor will discuss the results of your test with you.  SEEK IMMEDIATE MEDICAL ATTENTION IF ANY OF THE FOLLOWING OCCUR:  Excessive nausea (feeling sick to your stomach) and/or vomiting.   Severe abdominal pain and distention (swelling).   Trouble swallowing.   Temperature over 101 F (37.8 C).   Rectal bleeding or vomiting of blood.   Use Carafate suspension 1 g 3 times a day as needed for discomfort  Office visit with [place] in 3 months

## 2017-10-15 ENCOUNTER — Encounter (HOSPITAL_COMMUNITY): Payer: Self-pay | Admitting: Internal Medicine

## 2017-11-22 ENCOUNTER — Other Ambulatory Visit: Payer: Self-pay

## 2017-11-22 DIAGNOSIS — K7460 Unspecified cirrhosis of liver: Secondary | ICD-10-CM

## 2017-11-22 DIAGNOSIS — K900 Celiac disease: Secondary | ICD-10-CM

## 2017-11-25 ENCOUNTER — Other Ambulatory Visit: Payer: Self-pay

## 2017-11-25 DIAGNOSIS — K7460 Unspecified cirrhosis of liver: Secondary | ICD-10-CM

## 2017-12-03 ENCOUNTER — Telehealth: Payer: Self-pay

## 2017-12-03 ENCOUNTER — Other Ambulatory Visit: Payer: Self-pay

## 2017-12-03 DIAGNOSIS — K7460 Unspecified cirrhosis of liver: Secondary | ICD-10-CM

## 2017-12-03 NOTE — Telephone Encounter (Signed)
LMOM for a return call. Tried to call pt to let him know that another lab has been added on since I mailed his labs to do for 12/13/2017. I am putting this order in the mail for PT/INR. Just need to let pt know to take this order with him also, and let the lab know he had 2 separate orders per Neil Crouch, PA.

## 2017-12-10 LAB — CBC WITH DIFFERENTIAL/PLATELET
BASOS ABS: 30 {cells}/uL (ref 0–200)
Basophils Relative: 0.8 %
EOS PCT: 5.7 %
Eosinophils Absolute: 211 cells/uL (ref 15–500)
HCT: 39.2 % (ref 38.5–50.0)
HEMOGLOBIN: 13.2 g/dL (ref 13.2–17.1)
LYMPHS ABS: 1040 {cells}/uL (ref 850–3900)
MCH: 29.9 pg (ref 27.0–33.0)
MCHC: 33.7 g/dL (ref 32.0–36.0)
MCV: 88.9 fL (ref 80.0–100.0)
MPV: 12.1 fL (ref 7.5–12.5)
Monocytes Relative: 8.6 %
NEUTROS ABS: 2102 {cells}/uL (ref 1500–7800)
Neutrophils Relative %: 56.8 %
Platelets: 84 10*3/uL — ABNORMAL LOW (ref 140–400)
RBC: 4.41 10*6/uL (ref 4.20–5.80)
RDW: 12.2 % (ref 11.0–15.0)
Total Lymphocyte: 28.1 %
WBC mixed population: 318 cells/uL (ref 200–950)
WBC: 3.7 10*3/uL — ABNORMAL LOW (ref 3.8–10.8)

## 2017-12-10 LAB — COMPREHENSIVE METABOLIC PANEL
AG Ratio: 1.3 (calc) (ref 1.0–2.5)
ALT: 50 U/L — AB (ref 9–46)
AST: 42 U/L — ABNORMAL HIGH (ref 10–35)
Albumin: 3.9 g/dL (ref 3.6–5.1)
Alkaline phosphatase (APISO): 108 U/L (ref 40–115)
BILIRUBIN TOTAL: 0.6 mg/dL (ref 0.2–1.2)
BUN: 17 mg/dL (ref 7–25)
CALCIUM: 8.4 mg/dL — AB (ref 8.6–10.3)
CO2: 23 mmol/L (ref 20–32)
CREATININE: 1.14 mg/dL (ref 0.70–1.33)
Chloride: 104 mmol/L (ref 98–110)
GLUCOSE: 272 mg/dL — AB (ref 65–139)
Globulin: 2.9 g/dL (calc) (ref 1.9–3.7)
Potassium: 4.5 mmol/L (ref 3.5–5.3)
SODIUM: 135 mmol/L (ref 135–146)
TOTAL PROTEIN: 6.8 g/dL (ref 6.1–8.1)

## 2017-12-10 LAB — PROTIME-INR
INR: 1
PROTHROMBIN TIME: 10.4 s (ref 9.0–11.5)

## 2017-12-10 LAB — TISSUE TRANSGLUTAMINASE, IGA: (TTG) AB, IGA: 7 U/mL — AB

## 2017-12-10 LAB — AFP TUMOR MARKER: AFP TUMOR MARKER: 1.3 ng/mL (ref <6.1)

## 2017-12-14 ENCOUNTER — Ambulatory Visit: Payer: BLUE CROSS/BLUE SHIELD | Admitting: Family Medicine

## 2017-12-14 ENCOUNTER — Encounter: Payer: Self-pay | Admitting: Family Medicine

## 2017-12-14 ENCOUNTER — Other Ambulatory Visit: Payer: Self-pay

## 2017-12-14 VITALS — BP 114/70 | HR 64 | Temp 98.0°F | Resp 18 | Ht 67.0 in | Wt 233.0 lb

## 2017-12-14 DIAGNOSIS — N281 Cyst of kidney, acquired: Secondary | ICD-10-CM

## 2017-12-14 DIAGNOSIS — K900 Celiac disease: Secondary | ICD-10-CM | POA: Diagnosis not present

## 2017-12-14 DIAGNOSIS — Z9111 Patient's noncompliance with dietary regimen: Secondary | ICD-10-CM

## 2017-12-14 DIAGNOSIS — E119 Type 2 diabetes mellitus without complications: Secondary | ICD-10-CM | POA: Diagnosis not present

## 2017-12-14 DIAGNOSIS — N138 Other obstructive and reflux uropathy: Secondary | ICD-10-CM | POA: Diagnosis not present

## 2017-12-14 DIAGNOSIS — I10 Essential (primary) hypertension: Secondary | ICD-10-CM | POA: Diagnosis not present

## 2017-12-14 DIAGNOSIS — N401 Enlarged prostate with lower urinary tract symptoms: Secondary | ICD-10-CM

## 2017-12-14 DIAGNOSIS — G43109 Migraine with aura, not intractable, without status migrainosus: Secondary | ICD-10-CM | POA: Diagnosis not present

## 2017-12-14 DIAGNOSIS — E785 Hyperlipidemia, unspecified: Secondary | ICD-10-CM

## 2017-12-14 DIAGNOSIS — Z91119 Patient's noncompliance with dietary regimen due to unspecified reason: Secondary | ICD-10-CM | POA: Insufficient documentation

## 2017-12-14 MED ORDER — GLIMEPIRIDE 2 MG PO TABS: ORAL_TABLET | ORAL | 3 refills | Status: DC

## 2017-12-14 NOTE — Progress Notes (Signed)
Please let patient know his labs look good and stable. MELD 7.  TTG slightly elevated but overall he is doing well limiting his gluten exposure. Values have went from 100 at time of diagnosis, to 31, to 7!  -->Keep upcoming OV. We will update his u/s at that time to look at liver and kidney cyst again. -->Next labs in 6 months, TTG IgA, PT/INR, CMET, CBC, AFP tumor marker

## 2017-12-14 NOTE — Progress Notes (Signed)
Chief Complaint  Patient presents with  . Diabetes    f/u   Patient is here for diabetes follow-up.  His blood sugars are out of control.  A random blood sugar last week was 276.  Blood his fasting sugar this morning was over 200.  He is compliant with his medications, Amaryl 2 mg twice daily.  He is not compliant with diet.  He eats ice cream.  He eats breads (gluten-free).  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  He eats out a lot.  He has had a dietary counseling visit, and does not comply with the recommendations.  He states he knows what he needs to do but does not.  He states he is somewhat self-destructive.  He thinks "what is the point".  He admits to depression.  He does not have any thoughts of harming himself or others.  He states that the diet he is supposed to be on makes him feel "deprived". His blood pressure is well controlled. He takes his statin daily.  We will check his lipids. He has a history of BPH.  He requests a referral to urology. He requests referral to podiatry for his diabetic foot exam. He does also request a referral to neurology.  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  He will get flashing lights and then lose loss of vision in one eye that is transient periodically.  He states his prior physician told him it was an ocular migraine.  He has never seen a neurologist or had this.  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  He states he always has time to pull to the side of the road and wait out the episode. Patient Active Problem List   Diagnosis Date Noted  . Noncompliance of patient with dietary regimen 12/14/2017  . Renal cyst 09/03/2017  . Essential hypertension 07/06/2017  . Type 2 diabetes mellitus without complication, without long-term current use of insulin ([place]) 07/06/2017  . HLD (hyperlipidemia) 07/06/2017  .  Environmental allergies 07/06/2017  . BPH with obstruction/lower urinary tract symptoms 07/06/2017  . Situational depression 07/06/2017  . Esophageal varices determined by endoscopy ([place]) 07/06/2017  . Portal hypertension ([place]) 06/24/2017  . Vitamin D deficiency 08/06/2016  . Hepatic cirrhosis ([place]) 08/06/2016  . Hallux valgus 07/14/2016  . Thrombocytopenia ([place]) 04/07/2016  . Adult form of celiac disease 04/07/2016  . Hx of adenomatous colonic polyps 04/07/2016    Outpatient Encounter Medications as of 12/14/2017  Medication Sig  . acetaminophen (TYLENOL) 500 MG tablet Take 1,000 mg by mouth daily as needed for moderate pain or headache.   Marland Kitchen atorvastatin (LIPITOR) 20 MG tablet Take 1 tablet (20 mg total) by mouth daily.  . benazepril-hydrochlorthiazide (LOTENSIN HCT) 10-12.5 MG tablet Take 1 tablet by mouth daily.   . Ca Carbonate-Mag Hydroxide (ROLAIDS PO) Take 1-2 tablets by mouth daily as needed (indigestion).  . cholecalciferol (VITAMIN D) 1000 units tablet Take 1,000 Units by mouth daily.   Marland Kitchen glimepiride (AMARYL) 2 MG tablet Take two in the morning and one at night  . multivitamin (ONE-A-DAY MEN'S) TABS tablet Take 1 tablet by mouth daily.  Marland Kitchen neomycin-bacitracin-polymyxin (NEOSPORIN) ointment Apply 1 application topically as needed for wound care.  . [DISCONTINUED] glimepiride (AMARYL) 2 MG tablet Take 1 tablet (2 mg total) by mouth 2 (two) times  daily.   No facility-administered encounter medications on file as of 12/14/2017.     Allergies  Allergen Reactions  . Penicillins Other (See Comments)    As a baby, unknown reaction Has patient had a PCN reaction causing immediate rash, facial/tongue/throat swelling, SOB or lightheadedness with hypotension: Unknown Has patient had a PCN reaction causing severe rash involving mucus membranes or skin necrosis: Unknown Has patient had a PCN reaction that required hospitalization: Unknown Has patient had a PCN reaction occurring within  the last 10 years: No If all of the above answers are "NO", then may proceed with Cephalosporin use.   . Gluten Meal Diarrhea  . Biaxin [Clarithromycin] Tinitus    Ears ringing, loss sense of smell  . Metformin And Related     Severe diarrhea and nausea    Review of Systems  Constitutional: Positive for unexpected weight change. Negative for activity change, appetite change and fatigue.       Gain  HENT: Negative for congestion and dental problem.   Eyes: Positive for visual disturbance. Negative for photophobia.       Described in HPI  Respiratory: Negative for cough and shortness of breath.   Cardiovascular: Negative for chest pain, palpitations and leg swelling.  Gastrointestinal: Negative for blood in stool, constipation, diarrhea and vomiting.  Endocrine: Negative for polyphagia and polyuria.  Genitourinary: Positive for frequency. Negative for difficulty urinating and hematuria.  Musculoskeletal: Negative for arthralgias, back pain and gait problem.  Skin: Negative for color change.  Neurological: Negative for light-headedness and headaches.  Hematological: Does not bruise/bleed easily.  Psychiatric/Behavioral: Positive for dysphoric mood. Negative for sleep disturbance. The patient is not nervous/anxious.     BP 114/70 (BP Location: Left Arm, Patient Position: Sitting, Cuff Size: Large)   Pulse 64   Temp 98 F (36.7 C) (Temporal)   Resp 18   Ht 5' 7"  (1.702 m)   Wt 233 lb (105.7 kg)   SpO2 97%   BMI 36.49 kg/m   Physical Exam  Constitutional: He is oriented to person, place, and time. He appears well-developed and well-nourished. No distress.  HENT:  Head: Normocephalic and atraumatic.  Mouth/Throat: Oropharynx is clear and moist.  Eyes: Conjunctivae are normal. Pupils are equal, round, and reactive to light.  Neck: Normal range of motion. Neck supple.  Cardiovascular: Normal rate, regular rhythm and normal heart sounds.  Pulmonary/Chest: Effort normal and breath  sounds normal. No respiratory distress.  Abdominal: Soft. Bowel sounds are normal.  Protuberant, excess adiposity around midline  Musculoskeletal: Normal range of motion. He exhibits no edema.  Neurological: He is alert and oriented to person, place, and time.  Gait normal  Skin: Skin is warm and dry.  Psychiatric: His behavior is normal. Thought content normal.  Depressed mood  Nursing note and vitals reviewed.   ASSESSMENT/PLAN:  1. Essential hypertension Controlled  2. Type 2 diabetes mellitus without complication, without long-term current use of insulin ([facility]) Not well controlled.  Discussed increasing medication.  Compliance with diet.  Referral to endocrinology.  He wishes to try on his own with a better diet.  He agrees to stop the ice cream. - Ambulatory referral to Podiatry  3. Hyperlipidemia, unspecified hyperlipidemia type We will check lipid panel  4. Celiac disease Under care of gastroenterology.  His celiac disease diet does complicate his ability to make good decisions for diabetes control.  5. BPH with obstruction/lower urinary tract symptoms Urinary frequency.  Likely hyperglycemia - Ambulatory referral to Urology  6.  Renal cyst History - Ambulatory referral to Urology  7. Ocular migraine New complaint of patient.  Will refer to neurology for consultation.  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 - Ambulatory referral to Neurology  8. Noncompliance of patient with dietary regimen Recommended referral back to nutritionist.  Patient declines.  He states "I know what I need to do".   Patient Instructions  Increase the amaryl to two in the morning and one at night Your blood pressure is great Work on the sugar in your diet Need an A1c and cholesterol check today Exercise every day that you are able  See me every 3 months     Raylene Everts, MD

## 2017-12-14 NOTE — Patient Instructions (Addendum)
Increase the amaryl to two in the morning and one at night Your blood pressure is great Work on the sugar in your diet Need an A1c and cholesterol check today Exercise every day that you are able  See me every 3 months

## 2017-12-14 NOTE — Progress Notes (Signed)
See other result note too. Glucose 272.

## 2017-12-15 ENCOUNTER — Other Ambulatory Visit: Payer: Self-pay

## 2017-12-15 LAB — COMPLETE METABOLIC PANEL WITH GFR
AG Ratio: 1.3 (calc) (ref 1.0–2.5)
ALBUMIN MSPROF: 4.3 g/dL (ref 3.6–5.1)
ALKALINE PHOSPHATASE (APISO): 101 U/L (ref 40–115)
ALT: 49 U/L — AB (ref 9–46)
AST: 38 U/L — AB (ref 10–35)
BILIRUBIN TOTAL: 0.8 mg/dL (ref 0.2–1.2)
BUN: 16 mg/dL (ref 7–25)
CHLORIDE: 101 mmol/L (ref 98–110)
CO2: 27 mmol/L (ref 20–32)
CREATININE: 1.05 mg/dL (ref 0.70–1.33)
Calcium: 9.7 mg/dL (ref 8.6–10.3)
GFR, Est African American: 91 mL/min/{1.73_m2} (ref >=60)
GFR, Est Non African American: 78 mL/min/{1.73_m2} (ref >=60)
GLUCOSE: 214 mg/dL — AB (ref 65–99)
Globulin: 3.4 g/dL (calc) (ref 1.9–3.7)
Potassium: 4.6 mmol/L (ref 3.5–5.3)
Sodium: 137 mmol/L (ref 135–146)
Total Protein: 7.7 g/dL (ref 6.1–8.1)

## 2017-12-15 LAB — CBC WITH DIFFERENTIAL/PLATELET
BASOS ABS: 32 {cells}/uL (ref 0–200)
Basophils Relative: 0.7 %
EOS PCT: 7.2 %
Eosinophils Absolute: 331 cells/uL (ref 15–500)
HEMATOCRIT: 42.8 % (ref 38.5–50.0)
HEMOGLOBIN: 14.6 g/dL (ref 13.2–17.1)
LYMPHS ABS: 1242 {cells}/uL (ref 850–3900)
MCH: 29.9 pg (ref 27.0–33.0)
MCHC: 34.1 g/dL (ref 32.0–36.0)
MCV: 87.7 fL (ref 80.0–100.0)
MPV: 12.1 fL (ref 7.5–12.5)
Monocytes Relative: 8.5 %
NEUTROS PCT: 56.6 %
Neutro Abs: 2604 cells/uL (ref 1500–7800)
Platelets: 97 10*3/uL — ABNORMAL LOW (ref 140–400)
RBC: 4.88 10*6/uL (ref 4.20–5.80)
RDW: 12.4 % (ref 11.0–15.0)
TOTAL LYMPHOCYTE: 27 %
WBC: 4.6 10*3/uL (ref 3.8–10.8)
WBCMIX: 391 {cells}/uL (ref 200–950)

## 2017-12-15 LAB — URINALYSIS, ROUTINE W REFLEX MICROSCOPIC
Bilirubin Urine: NEGATIVE
HGB URINE DIPSTICK: NEGATIVE
KETONES UR: NEGATIVE
LEUKOCYTES UA: NEGATIVE
NITRITE: NEGATIVE
Protein, ur: NEGATIVE
SPECIFIC GRAVITY, URINE: 1.014 (ref 1.001–1.03)
pH: 6.5 (ref 5.0–8.0)

## 2017-12-15 LAB — LIPID PANEL
CHOL/HDL RATIO: 3.5 (calc) (ref <5.0)
CHOLESTEROL: 146 mg/dL (ref <200)
HDL: 42 mg/dL (ref >40)
LDL CHOLESTEROL (CALC): 83 mg/dL
Non-HDL Cholesterol (Calc): 104 mg/dL (calc) (ref <130)
TRIGLYCERIDES: 114 mg/dL (ref <150)

## 2017-12-15 LAB — HEMOGLOBIN A1C
EAG (MMOL/L): 12.5 (calc)
Hgb A1c MFr Bld: 9.5 % of total Hgb — ABNORMAL HIGH (ref <5.7)
Mean Plasma Glucose: 226 (calc)

## 2017-12-15 MED ORDER — HYDROCHLOROTHIAZIDE 12.5 MG PO TABS: 12.5000 mg | ORAL_TABLET | Freq: Every day | ORAL | 0 refills | Status: DC

## 2017-12-16 ENCOUNTER — Other Ambulatory Visit: Payer: Self-pay

## 2017-12-16 DIAGNOSIS — K7460 Unspecified cirrhosis of liver: Secondary | ICD-10-CM

## 2017-12-23 ENCOUNTER — Other Ambulatory Visit: Payer: Self-pay

## 2017-12-23 DIAGNOSIS — K7460 Unspecified cirrhosis of liver: Secondary | ICD-10-CM

## 2017-12-27 NOTE — Addendum Note (Signed)
Addended by: [name] on: 12/27/2017 01:12 PM   Modules accepted: Orders

## 2017-12-29 NOTE — Addendum Note (Signed)
Addended by: [name] on: 12/29/2017 09:23 PM   Modules accepted: Orders

## 2018-01-04 ENCOUNTER — Telehealth: Payer: Self-pay | Admitting: Internal Medicine

## 2018-01-04 NOTE — Telephone Encounter (Signed)
Letter mailed

## 2018-01-04 NOTE — Telephone Encounter (Signed)
RECALL FOR ULTRASOUND 

## 2018-01-11 ENCOUNTER — Encounter: Payer: Self-pay | Admitting: Gastroenterology

## 2018-01-11 ENCOUNTER — Ambulatory Visit: Payer: BLUE CROSS/BLUE SHIELD | Admitting: Gastroenterology

## 2018-01-11 VITALS — BP 135/73 | HR 74 | Temp 96.7°F | Ht 67.0 in | Wt 233.2 lb

## 2018-01-11 DIAGNOSIS — K900 Celiac disease: Secondary | ICD-10-CM

## 2018-01-11 DIAGNOSIS — N281 Cyst of kidney, acquired: Secondary | ICD-10-CM | POA: Diagnosis not present

## 2018-01-11 DIAGNOSIS — K7460 Unspecified cirrhosis of liver: Secondary | ICD-10-CM

## 2018-01-11 NOTE — Progress Notes (Signed)
Primary Care Physician: Caren Macadam, MD  Primary Gastroenterologist:  Garfield Cornea, MD   Chief Complaint  Patient presents with  . Cirrhosis    pp f/u, doing ok    HPI: Michael Harmon is a 57 y.o. male here for follow-up. He has a history of cirrhosis, celiac disease, Gerd. He underwent esophageal variceal banding back in December. He also had portal hypertensive gastropathy.   Patient is up-to-date on cirrhosis care labs. MELD 7. His TTG was only 7 indicating pretty good control of his celiac disease. He is having trouble with his diabetes now that his celiac is under control. Last hemoglobin A1c was over nine. His Amaryl was doubled but he continues to get fasting sugars over 200 and postprandial sugars over 300. He plans to stop by and see Dr. Francesca Oman nurse today to request either change in medication or referral to an endocrinologist.  Clinically he feels well. Denies abdominal pain or swelling. Heartburn well controlled. Bowel movements are regular. No diarrhea. No melena rectal bleeding. Weight is stable    Current Outpatient Medications  Medication Sig Dispense Refill  . acetaminophen (TYLENOL) 500 MG tablet Take 1,000 mg by mouth daily as needed for moderate pain or headache.     Marland Kitchen atorvastatin (LIPITOR) 20 MG tablet Take 1 tablet (20 mg total) by mouth daily. 90 tablet 3  . benazepril-hydrochlorthiazide (LOTENSIN HCT) 10-12.5 MG tablet Take 1 tablet by mouth daily.     . Ca Carbonate-Mag Hydroxide (ROLAIDS PO) Take 1-2 tablets by mouth daily as needed (indigestion).    . cholecalciferol (VITAMIN D) 1000 units tablet Take 1,000 Units by mouth daily.     Marland Kitchen glimepiride (AMARYL) 2 MG tablet Take two in the morning and one at night (Patient taking differently: Take two in the morning and two at night) 180 tablet 3  . hydrochlorothiazide (HYDRODIURIL) 12.5 MG tablet Take 1 tablet (12.5 mg total) by mouth daily. 90 tablet 0  . multivitamin (ONE-A-DAY MEN'S) TABS tablet  Take 1 tablet by mouth daily.    Marland Kitchen neomycin-bacitracin-polymyxin (NEOSPORIN) ointment Apply 1 application topically as needed for wound care.     No current facility-administered medications for this visit.     Allergies as of 01/11/2018 - Review Complete 01/11/2018  Allergen Reaction Noted  . Penicillins Other (See Comments) 04/07/2016  . Gluten meal Diarrhea 10/06/2017  . Biaxin [clarithromycin] Tinitus   . Metformin and related  12/14/2017    ROS:  General: Negative for anorexia, weight loss, fever, chills, fatigue, weakness. ENT: Negative for hoarseness, difficulty swallowing , nasal congestion. CV: Negative for chest pain, angina, palpitations, dyspnea on exertion, peripheral edema.  Respiratory: Negative for dyspnea at rest, dyspnea on exertion, cough, sputum, wheezing.  GI: See history of present illness. GU:  Negative for dysuria, hematuria, urinary incontinence, urinary frequency, nocturnal urination.  Endo: Negative for unusual weight change.    Physical Examination:   BP 135/73   Pulse 74   Temp (!) 96.7 F (35.9 C) (Oral)   Ht 5' 7"  (1.702 m)   Wt 233 lb 3.2 oz (105.8 kg)   BMI 36.52 kg/m   General: Well-nourished, well-developed in no acute distress.  Eyes: No icterus. Mouth: Oropharyngeal mucosa moist and pink , no lesions erythema or exudate. Lungs: Clear to auscultation bilaterally.  Heart: Regular rate and rhythm, no murmurs rubs or gallops.  Abdomen: Bowel sounds are normal, nontender, nondistended, no hepatosplenomegaly or masses, no abdominal bruits or hernia , no rebound  or guarding.   Extremities: No lower extremity edema. No clubbing or deformities. Neuro: Alert and oriented x 4   Skin: Warm and dry, no jaundice.   Psych: Alert and cooperative, normal mood and affect.  Labs:  Lab Results  Component Value Date   WBC 4.6 12/14/2017   HGB 14.6 12/14/2017   HCT 42.8 12/14/2017   MCV 87.7 12/14/2017   PLT 97 (L) 12/14/2017   Lab Results    Component Value Date   CREATININE 1.05 12/14/2017   BUN 16 12/14/2017   NA 137 12/14/2017   K 4.6 12/14/2017   CL 101 12/14/2017   CO2 27 12/14/2017   Lab Results  Component Value Date   ALT 49 (H) 12/14/2017   AST 38 (H) 12/14/2017   ALKPHOS 87 07/06/2017   BILITOT 0.8 12/14/2017   Lab Results  Component Value Date   INR 1.0 12/09/2017   INR 1.0 02/04/2017   INR 1.0 08/06/2016    Imaging Studies: No results found.

## 2018-01-11 NOTE — Progress Notes (Signed)
cc'ed to pcp °

## 2018-01-11 NOTE — Assessment & Plan Note (Signed)
Currently up-to-date on labs. Disease is well compensated. He is due for Hepatoma surveillance and at the same time we will recheck right renal cyst. Return in six months for follow-up.

## 2018-01-11 NOTE — Patient Instructions (Signed)
1. Please have your ultrasound done. We will contact you with results within 5-7 business days.  2. Discuss your diabetes with Dr. Meda Coffee. If you need any assistance regarding referrals, let me know.  3. Return to the office in six months.

## 2018-01-11 NOTE — Assessment & Plan Note (Signed)
Clinically doing well clinically doing well with low TTG level indicating avoidance of gluten. He is having problems with his diabetes however. Encouraged him to address with his PCP or I even offered to discuss with Dr. Meda Coffee, consider endocrinology referral. Patient is getting frustrated as he feels like his diet for celiac is going against his diabetes diet. He is been to the nutritionist and has a good understanding however he notes a significant amount of carbohydrates added to gluten-free products.  Return to the office in six months.

## 2018-01-18 ENCOUNTER — Ambulatory Visit (HOSPITAL_COMMUNITY)
Admission: RE | Admit: 2018-01-18 | Discharge: 2018-01-18 | Disposition: A | Discharge status: Home / Self Care | Payer: BLUE CROSS/BLUE SHIELD | Source: Ambulatory Visit | Attending: Gastroenterology | Admitting: Gastroenterology

## 2018-01-18 DIAGNOSIS — K7460 Unspecified cirrhosis of liver: Secondary | ICD-10-CM

## 2018-01-18 DIAGNOSIS — N281 Cyst of kidney, acquired: Secondary | ICD-10-CM

## 2018-01-19 NOTE — Progress Notes (Signed)
Please let patient know that liver is stable. Right renal cyst is 1.8cm. Felt to be benign but will continue to follow with his u/s every six months.   abd u/s in six months Dx: cirrhosis, hepatoma screening, right renal cyst.

## 2018-03-08 ENCOUNTER — Other Ambulatory Visit: Payer: Self-pay | Admitting: Family Medicine

## 2018-03-09 ENCOUNTER — Telehealth: Payer: Self-pay | Admitting: Family Medicine

## 2018-03-09 NOTE — Telephone Encounter (Signed)
Spoke with patient and explained to him the directions I see on the prescription are to take 2 in the morning and 2 at night. He stated his bottle doesn't say that. I told him to call the pharmacy and see if they have a different prescription of file for him with those directions. If they do not, he can call back and let me know and I will forward a message to Dr.Hagler to see if a new prescription can be called in until his appt with her on April 19, 2018. He verbalized understanding.

## 2018-03-09 NOTE — Telephone Encounter (Signed)
Pharmacy: [facility] on [address], [place] Cb#: [phone]  Patient called in to request refill of glimepiride, says Dr.Nelson changed his instructions causing him to run out of medication faster.  Please call him to discuss.

## 2018-03-15 ENCOUNTER — Ambulatory Visit: Payer: BLUE CROSS/BLUE SHIELD | Admitting: Family Medicine

## 2018-04-05 ENCOUNTER — Telehealth: Payer: Self-pay | Admitting: Family Medicine

## 2018-04-05 NOTE — Telephone Encounter (Signed)
lvm to call back & discuss est. Care appointment he has scheduled with Dr.Hagler, if he wants to see her before she leaves he can r/s

## 2018-04-07 ENCOUNTER — Encounter: Payer: Self-pay | Admitting: Family Medicine

## 2018-04-08 ENCOUNTER — Encounter: Payer: Self-pay | Admitting: Family Medicine

## 2018-04-09 ENCOUNTER — Other Ambulatory Visit: Payer: Self-pay | Admitting: Family Medicine

## 2018-04-15 ENCOUNTER — Encounter: Payer: Self-pay | Admitting: Family Medicine

## 2018-04-15 ENCOUNTER — Other Ambulatory Visit: Payer: Self-pay

## 2018-04-15 ENCOUNTER — Ambulatory Visit (INDEPENDENT_AMBULATORY_CARE_PROVIDER_SITE_OTHER): Payer: BLUE CROSS/BLUE SHIELD | Admitting: Family Medicine

## 2018-04-15 VITALS — BP 118/60 | HR 69 | Temp 98.4°F | Resp 14 | Ht 67.0 in | Wt 232.1 lb

## 2018-04-15 DIAGNOSIS — E1169 Type 2 diabetes mellitus with other specified complication: Secondary | ICD-10-CM | POA: Diagnosis not present

## 2018-04-15 DIAGNOSIS — I10 Essential (primary) hypertension: Secondary | ICD-10-CM

## 2018-04-15 DIAGNOSIS — E785 Hyperlipidemia, unspecified: Secondary | ICD-10-CM

## 2018-04-15 MED ORDER — HYDROCHLOROTHIAZIDE 12.5 MG PO TABS: 12.5000 mg | ORAL_TABLET | Freq: Every day | ORAL | 0 refills | Status: DC

## 2018-04-15 MED ORDER — METFORMIN HCL 500 MG PO TABS: 500.0000 mg | ORAL_TABLET | Freq: Two times a day (BID) | ORAL | 1 refills | Status: DC

## 2018-04-15 MED ORDER — ATORVASTATIN CALCIUM 20 MG PO TABS: 20.0000 mg | ORAL_TABLET | Freq: Every day | ORAL | 3 refills | Status: DC

## 2018-04-15 MED ORDER — GLIMEPIRIDE 4 MG PO TABS: ORAL_TABLET | ORAL | 1 refills | Status: DC

## 2018-04-15 NOTE — Progress Notes (Signed)
Patient ID: Michael Harmon, male    DOB: Feb 17, 1961, 57 y.o.   MRN: [id number]  Chief Complaint  Patient presents with  . Establish Care    former nelson patient    Allergies Penicillins; Gluten meal; and Biaxin [clarithromycin]  Subjective:   Michael Harmon is a 57 y.o. male who presents to [facility] today.  HPI Here for follow up visit.  Was previously seen by Dr. Lysle Morales.  Is also followed by gastroenterology. Reports that used to be followed by endocrinologist, but does not have one at this time. Has been diagnosed with celiac disease and is followed by Dr. Sydell Axon. 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 Reports that sugars are not controlled and has been having high blood sugars for a long time.  Denies any hypoglycemic episodes. Was previously on metformin and then had some diarrhea on it. Was later diagnosed with celiac. No CP or SOB. Does not exercise. Still works but does not routinely exercise. Mood is good. BP has been controlled.  Diabetes  He presents for his follow-up diabetic visit. He has type 2 diabetes mellitus. No MedicAlert identification noted. His disease course has been fluctuating. There are no hypoglycemic associated symptoms. Pertinent negatives for hypoglycemia include no dizziness, headaches, nervousness/anxiousness or tremors. Pertinent negatives for diabetes include no blurred vision, no chest pain, no fatigue, no foot paresthesias, no foot ulcerations, no polydipsia, no polyphagia and no weakness. Symptoms are improving. Pertinent negatives for diabetic complications include no autonomic neuropathy, CVA, heart disease, nephropathy, peripheral neuropathy or PVD. Risk factors for coronary artery disease include diabetes mellitus, dyslipidemia, family history, male sex, obesity and sedentary lifestyle. Current diabetic treatment includes diet and oral agent (monotherapy). He is compliant with treatment most of the time. His weight  is stable. He is following a generally unhealthy (gluten free) diet. When asked about meal planning, he reported none. He rarely participates in exercise. His overall blood glucose range is 180-200 mg/dl. An ACE inhibitor/angiotensin II receptor blocker is being taken. He does not see a podiatrist.Eye exam is current.    Past Medical History:  Diagnosis Date  . Allergy    nose and sinus problems  . Anemia   . Asthma   . Celiac disease   . Cirrhosis, cryptogenic ([place])   . DM (diabetes mellitus) ([place])   . GERD (gastroesophageal reflux disease)   . Hyperlipidemia    diet controlled now with 100 pound weight loss  . Hypertension   . Renal cyst 09/03/2017  . Situational depression 07/06/2017  . Splenomegaly   . 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 BIOPSY  07/09/2016   Procedure: BIOPSY;  Surgeon: [name], MD;  Location: [facility];  Service: Endoscopy;;  duodenal, gastric, esophaageal  . CLOSED MANIPULATION SHOULDER    . COLONOSCOPY  11/2015   Dr. Posey Pronto: cecal bx neg for microscopic colitis. ascending colon polyp (adenomatous)  . ESOPHAGEAL BANDING N/A 08/04/2017   Procedure: ESOPHAGEAL BANDING;  Surgeon: [name], MD;  Location: [facility];  Service: Endoscopy;  Laterality: N/A;  esophageal varices banding  . ESOPHAGEAL BANDING N/A 10/13/2017   Procedure: ESOPHAGEAL BANDING;  Surgeon: [name], MD;  [facility];  Service: Endoscopy;  Laterality: N/A;  . ESOPHAGOGASTRODUODENOSCOPY N/A 07/09/2016   Procedure: ESOPHAGOGASTRODUODENOSCOPY (EGD);  Surgeon:  [name], MD;  [facility]: [facility];  Service: Endoscopy;  Laterality: N/A;  730  . ESOPHAGOGASTRODUODENOSCOPY N/A 08/04/2017   Procedure: ESOPHAGOGASTRODUODENOSCOPY (EGD);  Surgeon: [name], MD;  Location: [facility];  Service: Endoscopy;  Laterality:  N/A;  7:30am  . ESOPHAGOGASTRODUODENOSCOPY N/A 10/13/2017   Dr. [name]: grade 2 esophageal varices status post banding, portal hypertensive gastropathy    Family History  Problem Relation Age of Onset  . Other Mother        chronic diarrhea  . COPD Mother   . Heart disease Mother        died at 79  . Arthritis Mother   . Cancer Mother   . Depression Mother   . Diabetes Mother   . Hyperlipidemia Mother   . Hypertension Mother   . Stroke Mother   . Arthritis Father   . Asthma Father   . Birth defects Father   . Heart disease Father        aortic valve replaced  . Heart disease Maternal Grandmother   . Stroke Maternal Grandmother   . Heart disease Maternal Grandfather   . Stroke Maternal Grandfather   . Diabetes Paternal Grandmother   . Stroke Paternal Grandfather   . Heart disease Paternal Grandfather   . Liver disease Neg Hx   . Colon cancer Neg Hx      Social History   Socioeconomic History  . Marital status: Married    Spouse name: Melissa  . Number of children: 0  . Years of education: 35  . Highest education level: Not on file  Occupational History  . Occupation: Customer service manager, [employer], travels  Social Needs  . Financial resource strain: Not on file  . Food insecurity:    Worry: Not on file    Inability: Not on file  . Transportation needs:    Medical: Not on file    Non-medical: Not on file  Tobacco Use  . Smoking status: Never Smoker  . Smokeless tobacco: Never Used  Substance and Sexual Activity  . Alcohol use: Yes    Alcohol/week: 0.0 oz    Comment: rare, 1-2 drinks a month, on vacation  . Drug use: No  . Sexual activity: Not Currently  Lifestyle  . Physical activity:    Days per week: Not on file    Minutes per session: Not on file  . Stress: Not on file  Relationships  . Social connections:    Talks on phone: Not on file    Gets together: Not on file    Attends religious service: Not on file    Active member of club or  organization: Not on file    Attends meetings of clubs or organizations: Not on file    Relationship status: Not on file  Other Topics Concern  . Not on file  Social History Narrative   Lives at home with wife Lenna Sciara   One dog.   Has no children.   Eats all food groups.   Works for [employer] and [employer].        Review of Systems  Constitutional: Negative for appetite change, chills, fatigue, fever and unexpected weight change.  HENT: Negative for trouble swallowing and voice change.   Eyes: Negative for blurred vision and visual disturbance.  Respiratory: Negative for cough, chest tightness, shortness of breath and wheezing.   Cardiovascular: Negative for chest pain, palpitations and leg swelling.  Gastrointestinal: Negative for abdominal pain, blood  in stool, diarrhea, nausea and vomiting.  Endocrine: Negative for polydipsia and polyphagia.  Genitourinary: Negative for decreased urine volume, dysuria and frequency.  Musculoskeletal: Negative for myalgias.  Skin: Negative for rash.  Neurological: Negative for dizziness, tremors, syncope, facial asymmetry, weakness and headaches.  Hematological: Negative for adenopathy. Does not bruise/bleed easily.  Psychiatric/Behavioral: The patient is not nervous/anxious.    Current Outpatient Medications on File Prior to Visit  Medication Sig Dispense Refill  . acetaminophen (TYLENOL) 500 MG tablet Take 1,000 mg by mouth daily as needed for moderate pain or headache.     . benazepril-hydrochlorthiazide (LOTENSIN HCT) 10-12.5 MG tablet Take 1 tablet by mouth daily.     . Ca Carbonate-Mag Hydroxide (ROLAIDS PO) Take 1-2 tablets by mouth daily as needed (indigestion).    . cholecalciferol (VITAMIN D) 1000 units tablet Take 1,000 Units by mouth daily.     . multivitamin (ONE-A-DAY MEN'S) TABS tablet Take 1 tablet by mouth daily.    Marland Kitchen neomycin-bacitracin-polymyxin (NEOSPORIN) ointment Apply 1 application topically as needed for  wound care.     No current facility-administered medications on file prior to visit.     Objective:   BP 118/60 (BP Location: Left Arm, Patient Position: Sitting, Cuff Size: Large)   Pulse 69   Temp 98.4 F (36.9 C) (Temporal)   Resp 14   Ht 5' 7"  (1.702 m)   Wt 232 lb 1.9 oz (105.3 kg)   SpO2 98%   BMI 36.36 kg/m   Physical Exam  Constitutional: He is oriented to person, place, and time. He appears well-developed and well-nourished.  HENT:  Head: Normocephalic and atraumatic.  Eyes: Pupils are equal, round, and reactive to light. Conjunctivae and EOM are normal.  Neck: Normal range of motion. Neck supple. No JVD present. No thyromegaly present.  Cardiovascular: Normal rate, regular rhythm, normal heart sounds and intact distal pulses.  Pulmonary/Chest: Effort normal and breath sounds normal.  Musculoskeletal: He exhibits no edema.  Neurological: He is alert and oriented to person, place, and time. No cranial nerve deficit.  Skin: Skin is warm, dry and intact. Capillary refill takes less than 2 seconds.  Psychiatric: He has a normal mood and affect. His behavior is normal. Thought content normal.  Vitals reviewed.  Depression screen [facility] 2/9 04/15/2018 12/14/2017 07/06/2017 08/11/2016  Decreased Interest 0 0 0 0  Down, Depressed, Hopeless 0 0 0 0  PHQ - 2 Score 0 0 0 0    Assessment and Plan  1. Essential hypertension Lifestyle modifications discussed with patient including a diet emphasizing vegetables, fruits, and whole grains. Limiting intake of sodium to less than 2,400 mg per day.  Recommendations discussed include consuming low-fat dairy products, poultry, fish, legumes, non-tropical vegetable oils, and nuts; and limiting intake of sweets, sugar-sweetened beverages, and red meat. Discussed following a plan such as the Dietary Approaches to Stop Hypertension (DASH) diet. Patient to read up on this diet.   - hydrochlorothiazide (HYDRODIURIL) 12.5 MG tablet; Take 1 tablet  (12.5 mg total) by mouth daily.  Dispense: 45 tablet; Refill: 0  2. Hyperlipidemia, unspecified hyperlipidemia type Hyperlipidemia and the associated risk of ASCVD were discussed today. Primary vs. Secondary prevention of ASCVD were discussed and how it relates to patient morbidity, mortality, and quality of life. Shared decision making with patient including the risks of statins vs.benefits of ASCVD risk reduction discussed.  Risks of stains discussed including myopathy, rhabdomyoloysis, liver problems, increased risk of diabetes discussed. We discussed heart healthy diet, lifestyle modifications,  risk factor modifications, and adherence to the recommended treatment plan. We discussed the need to periodically monitor lipid panel and liver function tests while on statin therapy.   - Lipid panel - atorvastatin (LIPITOR) 20 MG tablet; Take 1 tablet (20 mg total) by mouth daily.  Dispense: 90 tablet; Refill: 3  3. Type 2 diabetes mellitus with other specified complication, without long-term current use of insulin (HCC) Add metformin 500 mg, 1 po qd for 2 weeks then increase to 1 po bid. Patient counseled in detail regarding the risks of medication. Told to call or return to clinic if develop any worrisome signs or symptoms. Patient voiced understanding.  Check BMP at follow up. If diarrhea or side effects with medication then call or RTC.  - Hemoglobin A1c - COMPLETE METABOLIC PANEL WITH GFR - metFORMIN (GLUCOPHAGE) 500 MG tablet; Take 1 tablet (500 mg total) by mouth 2 (two) times daily with a meal.  Dispense: 180 tablet; Refill: 1 - glimepiride (AMARYL) 4 MG tablet; Take one pill twice a day as directed  Dispense: 180 tablet; Refill: 1 - Ambulatory referral to Endocrinology  Return in about 3 months (around 07/16/2018) for Follow up . Caren Macadam, MD 04/15/2018

## 2018-04-16 ENCOUNTER — Encounter: Payer: Self-pay | Admitting: Family Medicine

## 2018-04-16 LAB — COMPLETE METABOLIC PANEL WITH GFR
AG RATIO: 1.3 (calc) (ref 1.0–2.5)
ALBUMIN MSPROF: 4.2 g/dL (ref 3.6–5.1)
ALT: 61 U/L — ABNORMAL HIGH (ref 9–46)
AST: 43 U/L — ABNORMAL HIGH (ref 10–35)
Alkaline phosphatase (APISO): 133 U/L — ABNORMAL HIGH (ref 40–115)
BUN: 20 mg/dL (ref 7–25)
CALCIUM: 9.3 mg/dL (ref 8.6–10.3)
CO2: 28 mmol/L (ref 20–32)
CREATININE: 1.05 mg/dL (ref 0.70–1.33)
Chloride: 101 mmol/L (ref 98–110)
GFR, EST AFRICAN AMERICAN: 90 mL/min/{1.73_m2} (ref >=60)
GFR, EST NON AFRICAN AMERICAN: 78 mL/min/{1.73_m2} (ref >=60)
GLOBULIN: 3.2 g/dL (ref 1.9–3.7)
Glucose, Bld: 306 mg/dL — ABNORMAL HIGH (ref 65–139)
POTASSIUM: 4.7 mmol/L (ref 3.5–5.3)
SODIUM: 136 mmol/L (ref 135–146)
TOTAL PROTEIN: 7.4 g/dL (ref 6.1–8.1)
Total Bilirubin: 0.7 mg/dL (ref 0.2–1.2)

## 2018-04-16 LAB — LIPID PANEL
CHOL/HDL RATIO: 3.6 (calc) (ref <5.0)
Cholesterol: 149 mg/dL (ref <200)
HDL: 41 mg/dL (ref >40)
LDL Cholesterol (Calc): 84 mg/dL (calc)
NON-HDL CHOLESTEROL (CALC): 108 mg/dL (ref <130)
Triglycerides: 144 mg/dL (ref <150)

## 2018-04-16 LAB — HEMOGLOBIN A1C
EAG (MMOL/L): 15.1 (calc)
Hgb A1c MFr Bld: 11.1 % of total Hgb — ABNORMAL HIGH (ref <5.7)
MEAN PLASMA GLUCOSE: 272 (calc)

## 2018-04-19 ENCOUNTER — Ambulatory Visit: Payer: BLUE CROSS/BLUE SHIELD | Admitting: Family Medicine

## 2018-05-25 ENCOUNTER — Other Ambulatory Visit: Payer: Self-pay

## 2018-05-25 DIAGNOSIS — K7460 Unspecified cirrhosis of liver: Secondary | ICD-10-CM

## 2018-06-13 ENCOUNTER — Telehealth: Payer: Self-pay | Admitting: Internal Medicine

## 2018-06-13 ENCOUNTER — Encounter: Payer: Self-pay | Admitting: Internal Medicine

## 2018-06-13 NOTE — Telephone Encounter (Signed)
RECALL FOR ULTRASOUND 

## 2018-06-13 NOTE — Telephone Encounter (Signed)
Recall sent 

## 2018-06-28 ENCOUNTER — Telehealth: Payer: Self-pay | Admitting: Internal Medicine

## 2018-06-28 ENCOUNTER — Encounter: Payer: Self-pay | Admitting: Internal Medicine

## 2018-06-28 DIAGNOSIS — N281 Cyst of kidney, acquired: Secondary | ICD-10-CM

## 2018-06-28 DIAGNOSIS — K7469 Other cirrhosis of liver: Secondary | ICD-10-CM

## 2018-06-28 NOTE — Addendum Note (Signed)
Addended by: Inge Rise on: 06/28/2018 01:21 PM   Modules accepted: Orders

## 2018-06-28 NOTE — Telephone Encounter (Signed)
Ultrasound scheduled for 07/22/18 at 8:30am, arrival time 8:15am, NPO after midnight  LMOVM

## 2018-06-28 NOTE — Telephone Encounter (Signed)
PATIENT SAID HE RECEIVED A LETTER TO SCHEDULE HIS ULTRASOUND   PLEASE CALL

## 2018-06-29 LAB — CBC WITH DIFFERENTIAL/PLATELET
Basophils Absolute: 42 cells/uL (ref 0–200)
Basophils Relative: 0.8 %
EOS ABS: 440 {cells}/uL (ref 15–500)
Eosinophils Relative: 8.3 %
HCT: 39.5 % (ref 38.5–50.0)
Hemoglobin: 13.5 g/dL (ref 13.2–17.1)
Lymphs Abs: 1150 cells/uL (ref 850–3900)
MCH: 30.5 pg (ref 27.0–33.0)
MCHC: 34.2 g/dL (ref 32.0–36.0)
MCV: 89.4 fL (ref 80.0–100.0)
MONOS PCT: 8.3 %
MPV: 12.2 fL (ref 7.5–12.5)
NEUTROS PCT: 60.9 %
Neutro Abs: 3228 cells/uL (ref 1500–7800)
Platelets: 81 10*3/uL — ABNORMAL LOW (ref 140–400)
RBC: 4.42 10*6/uL (ref 4.20–5.80)
RDW: 12.8 % (ref 11.0–15.0)
TOTAL LYMPHOCYTE: 21.7 %
WBC mixed population: 440 cells/uL (ref 200–950)
WBC: 5.3 10*3/uL (ref 3.8–10.8)

## 2018-06-29 LAB — COMPREHENSIVE METABOLIC PANEL
AG RATIO: 1.3 (calc) (ref 1.0–2.5)
ALBUMIN MSPROF: 4.1 g/dL (ref 3.6–5.1)
ALT: 39 U/L (ref 9–46)
AST: 37 U/L — ABNORMAL HIGH (ref 10–35)
Alkaline phosphatase (APISO): 94 U/L (ref 40–115)
BUN: 16 mg/dL (ref 7–25)
CHLORIDE: 102 mmol/L (ref 98–110)
CO2: 23 mmol/L (ref 20–32)
CREATININE: 1.12 mg/dL (ref 0.70–1.33)
Calcium: 9 mg/dL (ref 8.6–10.3)
GLOBULIN: 3.1 g/dL (ref 1.9–3.7)
Glucose, Bld: 229 mg/dL — ABNORMAL HIGH (ref 65–139)
POTASSIUM: 4.5 mmol/L (ref 3.5–5.3)
Sodium: 136 mmol/L (ref 135–146)
Total Bilirubin: 0.7 mg/dL (ref 0.2–1.2)
Total Protein: 7.2 g/dL (ref 6.1–8.1)

## 2018-06-29 LAB — TISSUE TRANSGLUTAMINASE, IGA: (TTG) AB, IGA: 9 U/mL — AB

## 2018-06-29 LAB — PROTIME-INR
INR: 1
PROTHROMBIN TIME: 10.5 s (ref 9.0–11.5)

## 2018-06-29 LAB — AFP TUMOR MARKER: AFP TUMOR MARKER: 1.4 ng/mL (ref <6.1)

## 2018-06-29 NOTE — Telephone Encounter (Signed)
Patient aware of appt details. Letter mailed

## 2018-07-01 ENCOUNTER — Other Ambulatory Visit: Payer: Self-pay

## 2018-07-01 DIAGNOSIS — K7460 Unspecified cirrhosis of liver: Secondary | ICD-10-CM

## 2018-07-19 ENCOUNTER — Ambulatory Visit: Payer: BLUE CROSS/BLUE SHIELD | Admitting: Family Medicine

## 2018-07-21 ENCOUNTER — Ambulatory Visit (INDEPENDENT_AMBULATORY_CARE_PROVIDER_SITE_OTHER): Payer: BLUE CROSS/BLUE SHIELD | Admitting: Family Medicine

## 2018-07-21 ENCOUNTER — Other Ambulatory Visit: Payer: Self-pay

## 2018-07-21 ENCOUNTER — Encounter: Payer: Self-pay | Admitting: Family Medicine

## 2018-07-21 VITALS — BP 124/68 | HR 72 | Temp 98.3°F | Resp 12 | Ht 67.0 in | Wt 230.0 lb

## 2018-07-21 DIAGNOSIS — E1169 Type 2 diabetes mellitus with other specified complication: Secondary | ICD-10-CM

## 2018-07-21 DIAGNOSIS — Z23 Encounter for immunization: Secondary | ICD-10-CM | POA: Diagnosis not present

## 2018-07-21 DIAGNOSIS — E785 Hyperlipidemia, unspecified: Secondary | ICD-10-CM | POA: Diagnosis not present

## 2018-07-21 DIAGNOSIS — I10 Essential (primary) hypertension: Secondary | ICD-10-CM | POA: Diagnosis not present

## 2018-07-21 MED ORDER — BENAZEPRIL HCL 10 MG PO TABS: 10.0000 mg | ORAL_TABLET | Freq: Every day | ORAL | 3 refills | Status: DC

## 2018-07-21 MED ORDER — ATORVASTATIN CALCIUM 20 MG PO TABS: 20.0000 mg | ORAL_TABLET | Freq: Every day | ORAL | 3 refills | Status: DC

## 2018-07-21 MED ORDER — ATORVASTATIN CALCIUM 20 MG PO TABS: 20.0000 mg | ORAL_TABLET | Freq: Every day | ORAL | 1 refills | Status: DC

## 2018-07-21 NOTE — Progress Notes (Signed)
Patient ID: Michael Harmon, male    DOB: [birth year], 57 y.o.   MRN: [id number]  Chief Complaint  Patient presents with  . Hypertension    follow up    Allergies Penicillins; Gluten meal; and Biaxin [clarithromycin]  Subjective:   Michael Harmon is a 57 y.o. male who presents to [facility] today.  HPI Michael Harmon presents today for follow-up.  He reports he has been doing well.  His taking his blood pressure medication and cholesterol medication each day as directed.  Was just seen by his endocrinologist and started on insulin.  Reports that sugars have been very high.  Has not been following his diet and eating like he should.  Is not getting much exercise.  Denies any lesions on his feet.  Denies any chest pain, shortness of breath, or swelling in his extremities.  His vision is good.  Still being seen by gastroenterology.  Reports his energy is pretty good.  Mood is good.  Is getting ready to go on a cruise with his wife.  Needs refills today.  Does not have any specific concerns.   Past Medical History:  Diagnosis Date  . Allergy    nose and sinus problems  . Anemia   . Asthma   . Celiac disease   . Cirrhosis, cryptogenic ([place])   . DM (diabetes mellitus) ([place])   . GERD (gastroesophageal reflux disease)   . Hyperlipidemia    diet controlled now with 100 pound weight loss  . Hypertension   . Renal cyst 09/03/2017  . Situational depression 07/06/2017  . Splenomegaly   . Thrombocytopenia ([place])    hematology, ?related to chol med (tricor) and glimeripide, just being monitored, above 100,000    Past Surgical History:  Procedure Laterality Date  . BIOPSY  07/09/2016   Procedure: BIOPSY;  Surgeon: [name], MD;  [facility];  Service: Endoscopy;;  duodenal, gastric, esophaageal  . CLOSED MANIPULATION SHOULDER    . COLONOSCOPY  11/2015   Dr. Posey Pronto: cecal bx neg for microscopic colitis. ascending colon polyp (adenomatous)  . ESOPHAGEAL BANDING N/A  08/04/2017   Procedure: ESOPHAGEAL BANDING;  Surgeon: [name], MD;  [facility];  Service: Endoscopy;  Laterality: N/A;  esophageal varices banding  . ESOPHAGEAL BANDING N/A 10/13/2017   Procedure: ESOPHAGEAL BANDING;  Surgeon: [name], MD;  [facility];  Service: Endoscopy;  Laterality: N/A;  . ESOPHAGOGASTRODUODENOSCOPY N/A 07/09/2016   Procedure: ESOPHAGOGASTRODUODENOSCOPY (EGD);  Surgeon: [name], MD;  [facility];  Service: Endoscopy;  Laterality: N/A;  730  . ESOPHAGOGASTRODUODENOSCOPY N/A 08/04/2017   Procedure: ESOPHAGOGASTRODUODENOSCOPY (EGD);  Surgeon: [name], MD;  [facility];  Service: Endoscopy;  Laterality: N/A;  7:30am  . ESOPHAGOGASTRODUODENOSCOPY N/A 10/13/2017   Dr. [name]: grade 2 esophageal varices status post banding, portal hypertensive gastropathy    Family History  Problem Relation Age of Onset  . Other Mother        chronic diarrhea  . COPD Mother   . Heart disease Mother        died at 65  . Arthritis Mother   . Cancer Mother   . Depression Mother   . Diabetes Mother   . Hyperlipidemia Mother   . Hypertension Mother   . Stroke Mother   . Arthritis Father   . Asthma Father   . Birth defects Father   . Heart disease Father  aortic valve replaced  . Heart disease Maternal Grandmother   . Stroke Maternal Grandmother   . Heart disease Maternal Grandfather   . Stroke Maternal Grandfather   . Diabetes Paternal Grandmother   . Stroke Paternal Grandfather   . Heart disease Paternal Grandfather   . Liver disease Neg Hx   . Colon cancer Neg Hx      Social History   Socioeconomic History  . Marital status: Married    Spouse name: Melissa  . Number of children: 0  . Years of education: 8  . Highest education level: Not on file  Occupational History  . Occupation: Customer service manager, [place], travels  Social Needs  . Financial resource strain: Not on file  .  Food insecurity:    Worry: Not on file    Inability: Not on file  . Transportation needs:    Medical: Not on file    Non-medical: Not on file  Tobacco Use  . Smoking status: Never Smoker  . Smokeless tobacco: Never Used  Substance and Sexual Activity  . Alcohol use: Yes    Alcohol/week: 0.0 standard drinks    Comment: rare, 1-2 drinks a month, on vacation  . Drug use: No  . Sexual activity: Not Currently  Lifestyle  . Physical activity:    Days per week: Not on file    Minutes per session: Not on file  . Stress: Not on file  Relationships  . Social connections:    Talks on phone: Not on file    Gets together: Not on file    Attends religious service: Not on file    Active member of club or organization: Not on file    Attends meetings of clubs or organizations: Not on file    Relationship status: Not on file  Other Topics Concern  . Not on file  Social History Narrative   Lives at home with wife Lenna Sciara   One dog.   Has no children.   Eats all food groups.   Works for [employer] and [employer].       Current Outpatient Medications on File Prior to Visit  Medication Sig Dispense Refill  . acetaminophen (TYLENOL) 500 MG tablet Take 1,000 mg by mouth daily as needed for moderate pain or headache.     . benazepril-hydrochlorthiazide (LOTENSIN HCT) 10-12.5 MG tablet Take 1 tablet by mouth daily.     . cholecalciferol (VITAMIN D) 1000 units tablet Take 1,000 Units by mouth daily.     Marland Kitchen glimepiride (AMARYL) 4 MG tablet Take one pill twice a day as directed 180 tablet 1  . hydrochlorothiazide (HYDRODIURIL) 12.5 MG tablet Take 1 tablet (12.5 mg total) by mouth daily. 45 tablet 0  . metFORMIN (GLUCOPHAGE) 500 MG tablet Take 1 tablet (500 mg total) by mouth 2 (two) times daily with a meal. 180 tablet 1  . multivitamin (ONE-A-DAY MEN'S) TABS tablet Take 1 tablet by mouth daily.    Marland Kitchen neomycin-bacitracin-polymyxin (NEOSPORIN) ointment Apply 1 application topically as  needed for wound care.     No current facility-administered medications on file prior to visit.     Review of Systems  Constitutional: Negative for activity change, appetite change, chills, fatigue, fever and unexpected weight change.  HENT: Negative for dental problem, sinus pressure, sinus pain, trouble swallowing and voice change.   Eyes: Negative for photophobia and visual disturbance.  Respiratory: Negative for cough, chest tightness, shortness of breath and wheezing.   Cardiovascular: Negative for  chest pain, palpitations and leg swelling.  Gastrointestinal: Negative for abdominal pain, diarrhea, nausea and vomiting.  Endocrine: Negative for polyphagia and polyuria.  Genitourinary: Negative for decreased urine volume, difficulty urinating, dysuria and frequency.  Skin: Negative for rash.  Neurological: Negative for dizziness, tremors, syncope, facial asymmetry, weakness and headaches.  Hematological: Negative for adenopathy. Does not bruise/bleed easily.   Depression screen [facility] 2/9 07/21/2018 04/15/2018 12/14/2017 07/06/2017 08/11/2016  Decreased Interest 1 0 0 0 0  Down, Depressed, Hopeless 0 0 0 0 0  PHQ - 2 Score 1 0 0 0 0    Objective:   BP 124/68 (BP Location: Left Arm, Patient Position: Sitting, Cuff Size: Normal)   Pulse 72   Temp 98.3 F (36.8 C) (Temporal)   Resp 12   Ht 5' 7"  (1.702 m)   Wt 230 lb 0.6 oz (104.3 kg)   SpO2 98% Comment: room air  BMI 36.03 kg/m   Physical Exam  Constitutional: He is oriented to person, place, and time. He appears well-developed and well-nourished. No distress.  HENT:  Head: Normocephalic and atraumatic.  Nose: Nose normal.  Mouth/Throat: Oropharynx is clear and moist.  Eyes: Pupils are equal, round, and reactive to light. EOM are normal. No scleral icterus.  Neck: Normal range of motion. Neck supple. No JVD present. No tracheal deviation present. No thyromegaly present.  Cardiovascular: Normal rate, regular rhythm and normal  heart sounds.  Pulmonary/Chest: Effort normal and breath sounds normal.  Abdominal: Soft. Bowel sounds are normal.  Musculoskeletal: He exhibits no edema.  Lymphadenopathy:    He has no cervical adenopathy.  Neurological: He is alert and oriented to person, place, and time. No cranial nerve deficit.  Skin: Skin is warm, dry and intact. Capillary refill takes less than 2 seconds. No rash noted. He is not diaphoretic. No pallor.  Psychiatric: He has a normal mood and affect. His behavior is normal. Thought content normal.  Nursing note and vitals reviewed.    Assessment and Plan  1. Essential hypertension Stable. Patient counseled in detail regarding the risks of medication. Told to call or return to clinic if develop any worrisome signs or symptoms. Patient voiced understanding.  Lifestyle modifications discussed with patient including a diet emphasizing vegetables, fruits, and whole grains. Limiting intake of sodium to less than 2,400 mg per day.  Recommendations discussed include consuming low-fat dairy products, poultry, fish, legumes, non-tropical vegetable oils, and nuts; and limiting intake of sweets, sugar-sweetened beverages, and red meat. Discussed following a plan such as the Dietary Approaches to Stop Hypertension (DASH) diet. Patient to read up on this diet.   - benazepril (LOTENSIN) 10 MG tablet; Take 1 tablet (10 mg total) by mouth daily.  Dispense: 45 tablet; Refill: 3  2. Hyperlipidemia, unspecified hyperlipidemia type Hyperlipidemia and the associated risk of ASCVD were discussed today. Primary vs. Secondary prevention of ASCVD were discussed and how it relates to patient morbidity, mortality, and quality of life. Shared decision making with patient including the risks of statins vs.benefits of ASCVD risk reduction discussed.  Risks of stains discussed including myopathy, rhabdomyoloysis, liver problems, increased risk of diabetes discussed. We discussed heart healthy diet,  lifestyle modifications, risk factor modifications, and adherence to the recommended treatment plan. We discussed the need to periodically monitor lipid panel and liver function tests while on statin therapy.  Patient counseled in detail regarding the risks of medication. Told to call or return to clinic if develop any worrisome signs or symptoms. Patient voiced understanding.  3. Type 2 diabetes mellitus with other specified complication, without long-term current use of insulin (HCC) - Pneumococcal conjugate vaccine 13-valent Foot care discussed.  4. Hyperlipidemia associated with type 2 diabetes mellitus ([place]) See above.  - atorvastatin (LIPITOR) 20 MG tablet; Take 1 tablet (20 mg total) by mouth daily.  Dispense: 90 tablet; Refill: 1  5. Need for immunization against influenza Given.  - Flu Vaccine QUAD 36+ mos IM Recommended patient follow-up with new PCP office in 1 to 2 months.  Keep scheduled visit with endocrinology.  Diet, exercise, and weight loss recommended.  Patient understands risks of elevated A1c and associated increased cardiovascular risk. No follow-ups on file. Caren Macadam, MD 07/24/2018

## 2018-07-22 ENCOUNTER — Ambulatory Visit (HOSPITAL_COMMUNITY)
Admission: RE | Admit: 2018-07-22 | Discharge: 2018-07-22 | Disposition: A | Discharge status: Home / Self Care | Payer: BLUE CROSS/BLUE SHIELD | Source: Ambulatory Visit | Attending: Gastroenterology | Admitting: Gastroenterology

## 2018-07-22 DIAGNOSIS — N281 Cyst of kidney, acquired: Secondary | ICD-10-CM | POA: Diagnosis not present

## 2018-07-22 DIAGNOSIS — K7469 Other cirrhosis of liver: Secondary | ICD-10-CM | POA: Diagnosis present

## 2018-07-26 NOTE — Progress Notes (Signed)
CC'D TO PCP AND ON RECALL  °

## 2018-08-23 ENCOUNTER — Ambulatory Visit: Payer: BLUE CROSS/BLUE SHIELD | Admitting: Internal Medicine

## 2018-08-23 ENCOUNTER — Encounter: Payer: Self-pay | Admitting: Internal Medicine

## 2018-08-23 VITALS — BP 111/69 | HR 72 | Temp 96.9°F | Ht 67.0 in | Wt 232.6 lb

## 2018-08-23 DIAGNOSIS — D126 Benign neoplasm of colon, unspecified: Secondary | ICD-10-CM

## 2018-08-23 DIAGNOSIS — K7469 Other cirrhosis of liver: Secondary | ICD-10-CM | POA: Diagnosis not present

## 2018-08-23 DIAGNOSIS — I8510 Secondary esophageal varices without bleeding: Secondary | ICD-10-CM

## 2018-08-23 NOTE — Patient Instructions (Signed)
Will continue checking your liver with ultrasound every 6 months  Top priority is controlling blood sugars  Continue good job of staying on a gluten free diet  Office visit in February of 2020 to set up a repeat EGD

## 2018-08-23 NOTE — Progress Notes (Signed)
Primary Care Physician:  Caren Macadam, MD Primary Gastroenterologist:  Dr. [name]  Pre-Procedure History & Physical: HPI:  Michael Harmon is a 57 y.o. 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  He has been seen down at the [facility] clinic earlier in the year.  Has been doing well with a low MELD.  He will be due for surveillance EGD by the first of the year.  Intolerant to beta blockade previously with hypotension.  EGD with EBL x2 last year.  Colonoscopy in [place] early 2017 adenoma removed; it was a small lesion.  Surveillance in 5 to 10 years.  Big issue these days is celiac disease and poorly controlled diabetes mellitus.  Hemoglobin A1c is in the 11 range now.  Followed by Dr. Thomasene Mohair, an endocrinologist, in [place].  He works diligently to maintain a gluten-free diet.  His TTG IgA was down to 9 recently.  In between PCPs as Drs. Meda Coffee and Hagler have left town.  Recent ultrasound demonstrated a stable simple right renal cyst.  No focal liver lesions.  Past Medical History:  Diagnosis Date  . Allergy    nose and sinus problems  . Anemia   . Asthma   . Celiac disease   . Cirrhosis, cryptogenic ([place])   . DM (diabetes mellitus) ([place])   . GERD (gastroesophageal reflux disease)   . Hyperlipidemia    diet controlled now with 100 pound weight loss  . Hypertension   . Renal cyst 09/03/2017  . Situational depression 07/06/2017  . Splenomegaly   . Thrombocytopenia ([place])    hematology, ?related to chol med (tricor) and glimeripide, just being monitored, above 100,000    Past Surgical History:  Procedure Laterality Date  . BIOPSY  07/09/2016   Procedure: BIOPSY;  Surgeon: [name], MD;  [facility];  Service: Endoscopy;;  duodenal, gastric, esophaageal  . CLOSED MANIPULATION SHOULDER    . COLONOSCOPY  11/2015   Dr. Posey Pronto: cecal bx neg for microscopic colitis. ascending colon polyp (adenomatous)  . ESOPHAGEAL BANDING N/A 08/04/2017   Procedure: ESOPHAGEAL BANDING;  Surgeon: [name], MD;  [facility];  Service: Endoscopy;  Laterality: N/A;  esophageal varices banding  . ESOPHAGEAL BANDING N/A 10/13/2017   Procedure: ESOPHAGEAL BANDING;  Surgeon: [name], MD;  [facility];  Service: Endoscopy;  Laterality: N/A;  . ESOPHAGOGASTRODUODENOSCOPY N/A 07/09/2016   Procedure: ESOPHAGOGASTRODUODENOSCOPY (EGD);  Surgeon: [name], MD;  [facility];  Service: Endoscopy;  Laterality: N/A;  730  . ESOPHAGOGASTRODUODENOSCOPY N/A 08/04/2017   Procedure: ESOPHAGOGASTRODUODENOSCOPY (EGD);  Surgeon: [name], MD;  [facility];  Service: Endoscopy;  Laterality: N/A;  7:30am  . ESOPHAGOGASTRODUODENOSCOPY N/A 10/13/2017   Dr. [name]: grade 2 esophageal varices status post banding, portal hypertensive gastropathy    Prior to Admission medications   Medication Sig Start Date End Date Taking? Authorizing Provider  acetaminophen (TYLENOL) 500 MG tablet Take 1,000 mg by mouth daily as needed for moderate pain or headache.    Yes [provider]  atorvastatin (LIPITOR) 20 MG tablet Take 1 tablet (20 mg total) by mouth daily. 07/21/18  Yes Hagler, Apolonio Schneiders, MD  benazepril (LOTENSIN) 10 MG tablet Take 1 tablet (10 mg total) by mouth daily. 07/21/18  Yes Caren Macadam, MD  cholecalciferol (VITAMIN D) 1000 units tablet Take 1,000 Units by mouth daily.    Yes [provider]  glimepiride (AMARYL) 4 MG tablet  Take one pill twice a day as directed Patient taking differently: Take 4 mg by mouth. Take one pill twice a day as directed 04/15/18  Yes Hagler, Apolonio Schneiders, MD  hydrochlorothiazide (HYDRODIURIL) 12.5 MG tablet Take 1 tablet (12.5 mg total) by mouth daily. 04/15/18  Yes Hagler, Apolonio Schneiders, MD  Insulin Degludec (TRESIBA [place]) Inject 30 Units into the skin at bedtime.   Yes [provider]  metFORMIN (GLUCOPHAGE) 500 MG tablet Take 1 tablet (500 mg total) by mouth 2 (two)  times daily with a meal. Patient taking differently: Take 1,000 mg by mouth 2 (two) times daily with a meal.  04/15/18  Yes Hagler, Apolonio Schneiders, MD  multivitamin (ONE-A-DAY MEN'S) TABS tablet Take 1 tablet by mouth daily.   Yes [provider]  neomycin-bacitracin-polymyxin (NEOSPORIN) ointment Apply 1 application topically as needed for wound care.   Yes [provider]  benazepril-hydrochlorthiazide (LOTENSIN HCT) 10-12.5 MG tablet Take 1 tablet by mouth daily.  06/13/17   [provider]    Allergies as of 08/23/2018 - Review Complete 08/23/2018  Allergen Reaction Noted  . Penicillins Other (See Comments) 04/07/2016  . Gluten meal Diarrhea 10/06/2017  . Biaxin [clarithromycin] Tinitus     Family History  Problem Relation Age of Onset  . Other Mother        chronic diarrhea  . COPD Mother   . Heart disease Mother        died at 39  . Arthritis Mother   . Cancer Mother   . Depression Mother   . Diabetes Mother   . Hyperlipidemia Mother   . Hypertension Mother   . Stroke Mother   . Arthritis Father   . Asthma Father   . Birth defects Father   . Heart disease Father        aortic valve replaced  . Heart disease Maternal Grandmother   . Stroke Maternal Grandmother   . Heart disease Maternal Grandfather   . Stroke Maternal Grandfather   . Diabetes Paternal Grandmother   . Stroke Paternal Grandfather   . Heart disease Paternal Grandfather   . Liver disease Neg Hx   . Colon cancer Neg Hx     Social History   Socioeconomic History  . Marital status: Married    Spouse name: Melissa  . Number of children: 0  . Years of education: 39  . Highest education level: Not on file  Occupational History  . Occupation: Customer service manager, [place], travels  Social Needs  . Financial resource strain: Not on file  . Food insecurity:    Worry: Not on file    Inability: Not on file  . Transportation needs:    Medical: Not on file    Non-medical:  Not on file  Tobacco Use  . Smoking status: Never Smoker  . Smokeless tobacco: Never Used  Substance and Sexual Activity  . Alcohol use: Yes    Alcohol/week: 0.0 standard drinks    Comment: rare, 1-2 drinks a month, on vacation  . Drug use: No  . Sexual activity: Not Currently  Lifestyle  . Physical activity:    Days per week: Not on file    Minutes per session: Not on file  . Stress: Not on file  Relationships  . Social connections:    Talks on phone: Not on file    Gets together: Not on file    Attends religious service: Not on file    Active member of club or organization: Not on  file    Attends meetings of clubs or organizations: Not on file    Relationship status: Not on file  . Intimate partner violence:    Fear of current or ex partner: Not on file    Emotionally abused: Not on file    Physically abused: Not on file    Forced sexual activity: Not on file  Other Topics Concern  . Not on file  Social History Narrative   Lives at home with wife Lenna Sciara   One dog.   Has no children.   Eats all food groups.   Works for [employer].        Review of Systems: See HPI, otherwise negative ROS  Physical Exam: BP 111/69   Pulse 72   Temp (!) 96.9 F (36.1 C) (Oral)   Ht 5' 7"  (1.702 m)   Wt 232 lb 9.6 oz (105.5 kg)   BMI 36.43 kg/m  General:   Alert,  pleasant and cooperative in NAD Neck:  Supple; no masses or thyromegaly. No significant cervical adenopathy. Lungs:  Clear throughout to auscultation.   No wheezes, crackles, or rhonchi. No acute distress. Heart:  Regular rate and rhythm; no murmurs, clicks, rubs,  or gallops. Abdomen: Non-distended, normal bowel sounds.  Soft and nontender without appreciable mass or hepatosplenomegaly.  Pulses:  Normal pulses noted. Extremities:  Without clubbing or edema.  Impression/Plan:  Poorly controlled blood sugars continues to be the major health challenge for this nice gentleman with NASH/  cirrhosis.  Continues to do very well from a standpoint of his liver.  He will be due for surveillance EGD the first of the year.  History of colonic adenoma due for surveillance no sooner than 2022  Doing well on a gluten-free diet.  Recommendations:  Will continue checking your liver with ultrasound every 6 months  Top priority is controlling blood sugars  Continue good job of staying on a gluten free diet  Office visit in February of 2020 to set up a repeat EGD     Notice: This dictation was prepared with Dragon dictation along with smaller phrase technology. Any transcriptional errors that result from this process are unintentional and may not be corrected upon review.

## 2018-11-09 ENCOUNTER — Other Ambulatory Visit: Payer: Self-pay | Admitting: Family Medicine

## 2018-11-09 DIAGNOSIS — E1169 Type 2 diabetes mellitus with other specified complication: Secondary | ICD-10-CM

## 2018-11-21 ENCOUNTER — Encounter: Payer: Self-pay | Admitting: Internal Medicine

## 2018-11-22 ENCOUNTER — Other Ambulatory Visit: Payer: Self-pay

## 2018-11-22 DIAGNOSIS — K7460 Unspecified cirrhosis of liver: Secondary | ICD-10-CM

## 2018-12-14 ENCOUNTER — Telehealth: Payer: Self-pay | Admitting: Internal Medicine

## 2018-12-14 NOTE — Telephone Encounter (Signed)
Letter mailed

## 2018-12-14 NOTE — Telephone Encounter (Signed)
RECALL FOR ULTRASOUND 

## 2018-12-23 LAB — COMPLETE METABOLIC PANEL WITHOUT GFR
AG Ratio: 1.3 (calc) (ref 1.0–2.5)
ALT: 58 U/L — ABNORMAL HIGH (ref 9–46)
AST: 55 U/L — ABNORMAL HIGH (ref 10–35)
Albumin: 4.1 g/dL (ref 3.6–5.1)
Alkaline phosphatase (APISO): 106 U/L (ref 35–144)
BUN: 18 mg/dL (ref 7–25)
CO2: 25 mmol/L (ref 20–32)
Calcium: 9.3 mg/dL (ref 8.6–10.3)
Chloride: 103 mmol/L (ref 98–110)
Creat: 1.14 mg/dL (ref 0.70–1.33)
GFR, Est African American: 82 mL/min/1.73m2
GFR, Est Non African American: 70 mL/min/1.73m2
Globulin: 3.1 g/dL (ref 1.9–3.7)
Glucose, Bld: 196 mg/dL — ABNORMAL HIGH (ref 65–139)
Potassium: 4.4 mmol/L (ref 3.5–5.3)
Sodium: 137 mmol/L (ref 135–146)
Total Bilirubin: 0.9 mg/dL (ref 0.2–1.2)
Total Protein: 7.2 g/dL (ref 6.1–8.1)

## 2018-12-23 LAB — CBC WITH DIFFERENTIAL/PLATELET
Absolute Monocytes: 372 {cells}/uL (ref 200–950)
Basophils Absolute: 12 {cells}/uL (ref 0–200)
Basophils Relative: 0.3 %
Eosinophils Absolute: 292 {cells}/uL (ref 15–500)
Eosinophils Relative: 7.3 %
HCT: 41.9 % (ref 38.5–50.0)
Hemoglobin: 14 g/dL (ref 13.2–17.1)
Lymphs Abs: 1192 {cells}/uL (ref 850–3900)
MCH: 30.2 pg (ref 27.0–33.0)
MCHC: 33.4 g/dL (ref 32.0–36.0)
MCV: 90.3 fL (ref 80.0–100.0)
MPV: 12.5 fL (ref 7.5–12.5)
Monocytes Relative: 9.3 %
Neutro Abs: 2132 {cells}/uL (ref 1500–7800)
Neutrophils Relative %: 53.3 %
Platelets: 71 Thousand/uL — ABNORMAL LOW (ref 140–400)
RBC: 4.64 Million/uL (ref 4.20–5.80)
RDW: 12.9 % (ref 11.0–15.0)
Total Lymphocyte: 29.8 %
WBC: 4 Thousand/uL (ref 3.8–10.8)

## 2018-12-23 LAB — PROTIME-INR
INR: 1.1
Prothrombin Time: 10.9 s (ref 9.0–11.5)

## 2018-12-23 LAB — AFP TUMOR MARKER: AFP-Tumor Marker: 1.6 ng/mL (ref <6.1)

## 2018-12-27 ENCOUNTER — Ambulatory Visit: Payer: BLUE CROSS/BLUE SHIELD | Admitting: Internal Medicine

## 2018-12-29 ENCOUNTER — Other Ambulatory Visit: Payer: Self-pay

## 2018-12-29 ENCOUNTER — Telehealth: Payer: Self-pay | Admitting: Internal Medicine

## 2018-12-29 ENCOUNTER — Encounter: Payer: Self-pay | Admitting: Internal Medicine

## 2018-12-29 DIAGNOSIS — K7460 Unspecified cirrhosis of liver: Secondary | ICD-10-CM

## 2018-12-29 NOTE — Addendum Note (Signed)
Addended by: Hassan Rowan on: 12/29/2018 09:55 AM   Modules accepted: Orders

## 2018-12-29 NOTE — Telephone Encounter (Signed)
PATIENT CALLED AND RECEIVED LETTER TO SCHEDULE ULTRASOUND, PLEASE CALL

## 2018-12-29 NOTE — Telephone Encounter (Signed)
[place] abd RUQ scheduled for 01/24/19 at 9:30am, arrive at 9:15am. NPO after midnight prior to test. Tried to call pt to inform of appt, no answer, LMOVM. Letter mailed.

## 2018-12-30 ENCOUNTER — Ambulatory Visit: Payer: BLUE CROSS/BLUE SHIELD | Admitting: Internal Medicine

## 2019-01-19 ENCOUNTER — Ambulatory Visit (HOSPITAL_COMMUNITY)
Admission: RE | Admit: 2019-01-19 | Discharge: 2019-01-19 | Disposition: A | Discharge status: Home / Self Care | Payer: BLUE CROSS/BLUE SHIELD | Source: Ambulatory Visit | Attending: Gastroenterology | Admitting: Gastroenterology

## 2019-01-19 ENCOUNTER — Other Ambulatory Visit: Payer: Self-pay

## 2019-01-19 DIAGNOSIS — K7460 Unspecified cirrhosis of liver: Secondary | ICD-10-CM | POA: Diagnosis present

## 2019-01-24 ENCOUNTER — Ambulatory Visit (HOSPITAL_COMMUNITY): Payer: BLUE CROSS/BLUE SHIELD

## 2019-02-21 ENCOUNTER — Encounter: Payer: Self-pay | Admitting: Internal Medicine

## 2019-02-21 ENCOUNTER — Ambulatory Visit (INDEPENDENT_AMBULATORY_CARE_PROVIDER_SITE_OTHER): Payer: BLUE CROSS/BLUE SHIELD | Admitting: Internal Medicine

## 2019-02-21 ENCOUNTER — Other Ambulatory Visit: Payer: Self-pay

## 2019-02-21 DIAGNOSIS — Z86010 Personal history of colonic polyps: Secondary | ICD-10-CM

## 2019-02-21 DIAGNOSIS — K7581 Nonalcoholic steatohepatitis (NASH): Secondary | ICD-10-CM | POA: Diagnosis not present

## 2019-02-21 NOTE — Progress Notes (Signed)
Referring Provider:  Primary Care Physician:  Caren Macadam, MD  Primary GI:   Virtual Visit via Telephone Note Due to COVID-19, visit is conducted virtually and was requested by patient.   I connected with NICKALOS PETERSEN on 02/21/19 at 10:00 AM EDT by telephone and verified that I am speaking with the correct person using two identifiers.   I discussed the limitations, risks, security and privacy concerns of performing an evaluation and management service by telephone and the availability of in person appointments. I also discussed with the patient that there may be a patient responsible charge related to this service. The patient expressed understanding and agreed to proceed. Labs from February of this year stable.  [place]  Chief Complaint  Patient presents with  . Cirrhosis    doing ok     History of Present Illness: 58 year old gentleman with [name] cirrhosis esophageal varices status post EBL.  He feels he is doing well from a liver standpoint these days.  He is not noticed any edema.  No melena or rectal bleeding.  Adherent to his gluten-free diet for celiac disease.  Big issue is glycemic control.  His endocrinologist in [place] has modified his diabetes regimen.  Reportedly hemoglobin A1c down from 11-6.8.  Due for surveillance colonoscopy (history of polyps) 2022; he ought to have surveillance EGD later in the year.  Intolerant to beta blockade therapy.   Past Medical History:  Diagnosis Date  . Allergy    nose and sinus problems  . Anemia   . Asthma   . Celiac disease   . 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 DM (diabetes mellitus) ([place])   . GERD (gastroesophageal reflux disease)   . Hyperlipidemia    diet controlled now with 100 pound weight loss  . Hypertension   . Renal cyst 09/03/2017  . Situational depression 07/06/2017  . Splenomegaly   . Thrombocytopenia ([place])    hematology, ?related to chol med (tricor) and glimeripide, just being monitored, above 100,000     Past Surgical History:  Procedure Laterality Date  . BIOPSY  07/09/2016   Procedure: BIOPSY;  Surgeon: [name], MD;  [facility];  Service: Endoscopy;;  duodenal, gastric, esophaageal  . CLOSED MANIPULATION SHOULDER    . COLONOSCOPY  11/2015   Dr. Posey Pronto: cecal bx neg for microscopic colitis. ascending colon polyp (adenomatous)  . ESOPHAGEAL BANDING N/A 08/04/2017   Procedure: ESOPHAGEAL BANDING;  Surgeon: [name], MD;  [facility];  Service: Endoscopy;  Laterality: N/A;  esophageal varices banding  . ESOPHAGEAL BANDING N/A 10/13/2017   Procedure: ESOPHAGEAL BANDING;  Surgeon: [name], MD;  [facility];  Service: Endoscopy;  Laterality: N/A;  . ESOPHAGOGASTRODUODENOSCOPY N/A 07/09/2016   Procedure: ESOPHAGOGASTRODUODENOSCOPY (EGD);  Surgeon: [name], MD;  [facility];  Service: Endoscopy;  Laterality: N/A;  730  . ESOPHAGOGASTRODUODENOSCOPY N/A 08/04/2017   Procedure: ESOPHAGOGASTRODUODENOSCOPY (EGD);  Surgeon: [name], MD;  [facility];  Service: Endoscopy;  Laterality: N/A;  7:30am  . ESOPHAGOGASTRODUODENOSCOPY N/A 10/13/2017   Dr. [name]: grade 2 esophageal varices status post banding, portal hypertensive gastropathy     Current Meds  Medication Sig  . acetaminophen (TYLENOL) 500 MG tablet Take 1,000 mg by mouth daily as needed for moderate pain or headache.   Marland Kitchen atorvastatin (LIPITOR) 20 MG tablet Take 1 tablet (20 mg total) by mouth daily.  . benazepril-hydrochlorthiazide (LOTENSIN HCT) 10-12.5 MG tablet Take  1 tablet by mouth daily.   . cholecalciferol (VITAMIN D) 1000 units tablet Take 1,000 Units by mouth daily.   Marland Kitchen glimepiride (AMARYL) 4 MG tablet Take one pill twice a day as directed (Patient taking differently: Take 4 mg by mouth. Take one pill twice a day as directed)  . LEVEMIR FLEXTOUCH 100 UNIT/ML Pen Inject 30 Units into the skin daily. Takes in evening  . multivitamin (ONE-A-DAY  MEN'S) TABS tablet Take 1 tablet by mouth daily.  Marland Kitchen neomycin-bacitracin-polymyxin (NEOSPORIN) ointment Apply 1 application topically as needed for wound care.       Observations/Objective: No distress. Unable to perform physical exam due to telephone encounter. No video available.   Assessment and Plan: Very pleasant 58 year old gentleman with Nash/cirrhosis esophageal varices status post EBL previously.  History of colonic polyps.  History of celiac disease.  Insulin-dependent diabetes mellitus. Overall, doing well from a GI standpoint.  Low meld reassuring. We talked about strict gluten-free diet and maximal glycemic being his major health challenges of these days.  Good glycemic control hopefully, will positively impact on his liver disease (effective celiac disease management will also be helpful).   Follow Up Instructions:  Continue to strive for good glycemic control.  Regular exercise  Continue gluten-free diet  Plan for a face-to-face office visit in 3 months to set up for surveillance upper endoscopy later in the year  Plan for repeat colonoscopy for polyp surveillance in 2022  Please let me know if you have any interim problems.     I discussed the assessment and treatment plan with the patient. The patient was provided an opportunity to ask questions and all were answered. The patient agreed with the plan and demonstrated an understanding of the instructions.   The patient was advised to call back or seek an in-person evaluation if the symptoms worsen or if the condition fails to improve as anticipated.  I provided 6 minutes of non-face-to-face time during this encounter.  R Mayford Knife, MD [facility]

## 2019-02-21 NOTE — Patient Instructions (Signed)
Continue to strive for good glycemic control.  Regular exercise  Continue gluten-free diet  Plan for a face-to-face office visit in 3 months to set up for surveillance upper endoscopy later in the year  Plan for repeat colonoscopy for polyp surveillance in 2022  Please let me know if you have any interim problems.

## 2019-05-29 ENCOUNTER — Other Ambulatory Visit: Payer: Self-pay

## 2019-05-29 DIAGNOSIS — K7460 Unspecified cirrhosis of liver: Secondary | ICD-10-CM

## 2019-07-07 LAB — COMPREHENSIVE METABOLIC PANEL
AG Ratio: 1.3 (calc) (ref 1.0–2.5)
ALT: 41 U/L (ref 9–46)
AST: 43 U/L — ABNORMAL HIGH (ref 10–35)
Albumin: 4 g/dL (ref 3.6–5.1)
Alkaline phosphatase (APISO): 90 U/L (ref 35–144)
BUN: 16 mg/dL (ref 7–25)
CO2: 23 mmol/L (ref 20–32)
Calcium: 9.1 mg/dL (ref 8.6–10.3)
Chloride: 107 mmol/L (ref 98–110)
Creat: 1.19 mg/dL (ref 0.70–1.33)
Globulin: 3.1 g/dL (calc) (ref 1.9–3.7)
Glucose, Bld: 90 mg/dL (ref 65–139)
Potassium: 4.3 mmol/L (ref 3.5–5.3)
Sodium: 138 mmol/L (ref 135–146)
Total Bilirubin: 0.8 mg/dL (ref 0.2–1.2)
Total Protein: 7.1 g/dL (ref 6.1–8.1)

## 2019-07-07 LAB — CBC WITH DIFFERENTIAL/PLATELET
Absolute Monocytes: 387 cells/uL (ref 200–950)
Basophils Absolute: 18 cells/uL (ref 0–200)
Basophils Relative: 0.4 %
Eosinophils Absolute: 248 cells/uL (ref 15–500)
Eosinophils Relative: 5.5 %
HCT: 40.8 % (ref 38.5–50.0)
Hemoglobin: 13.9 g/dL (ref 13.2–17.1)
Lymphs Abs: 1170 cells/uL (ref 850–3900)
MCH: 30.8 pg (ref 27.0–33.0)
MCHC: 34.1 g/dL (ref 32.0–36.0)
MCV: 90.5 fL (ref 80.0–100.0)
MPV: 12.1 fL (ref 7.5–12.5)
Monocytes Relative: 8.6 %
Neutro Abs: 2678 cells/uL (ref 1500–7800)
Neutrophils Relative %: 59.5 %
Platelets: 84 10*3/uL — ABNORMAL LOW (ref 140–400)
RBC: 4.51 10*6/uL (ref 4.20–5.80)
RDW: 13.1 % (ref 11.0–15.0)
Total Lymphocyte: 26 %
WBC: 4.5 10*3/uL (ref 3.8–10.8)

## 2019-07-07 LAB — PROTIME-INR
INR: 1
Prothrombin Time: 10.7 s (ref 9.0–11.5)

## 2019-07-17 ENCOUNTER — Encounter: Payer: Self-pay | Admitting: Internal Medicine

## 2019-07-17 NOTE — Progress Notes (Signed)
PATIENT SCHEDULED  °

## 2019-07-18 NOTE — Progress Notes (Signed)
Patient scheduled for appointment with rmr

## 2019-08-11 ENCOUNTER — Ambulatory Visit: Payer: BLUE CROSS/BLUE SHIELD | Admitting: Internal Medicine

## 2019-08-11 ENCOUNTER — Other Ambulatory Visit: Payer: Self-pay

## 2019-08-11 ENCOUNTER — Encounter: Payer: Self-pay | Admitting: Internal Medicine

## 2019-08-11 VITALS — BP 121/68 | HR 63 | Temp 96.8°F | Ht 67.0 in | Wt 235.0 lb

## 2019-08-11 DIAGNOSIS — K7460 Unspecified cirrhosis of liver: Secondary | ICD-10-CM | POA: Diagnosis not present

## 2019-08-11 NOTE — Patient Instructions (Addendum)
Schedule EGD with band ligation; hx of esophageal varices - Propofol (O730 slot)  Hepatic ultrasound now - hx of varices  Continue gluten free diet  Hold Amaryl dose night before procedure  Decrease Levemir to 20 units the night before procedure.  Further recommendations to follow

## 2019-08-11 NOTE — Progress Notes (Signed)
Primary Care Physician:  Caren Macadam, MD Primary Gastroenterologist:  Dr. [name]  Pre-Procedure History & Physical: HPI:  Michael Harmon is a 58 y.o. male here for Nash/cirrhosis (low MELD-8) and celiac disease.  Seen virtually 3 months ago.  He continues to do well.  He is adherent to a gluten-free diet.  Has not had any melena, rectal bleeding, abdominal swelling or change in mentation as far he is aware.  Prior EGD with EBL x2; intolerant of beta-blocker therapy.  Due for a surveillance/follow-up EGD now.  He is also followed by the hepatologist down at [facility].  They want him to have his next imaging study down there.  Past Medical History:  Diagnosis Date  . Allergy    nose and sinus problems  . Anemia   . Asthma   . Celiac disease   . 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 DM (diabetes mellitus) ([place])   . GERD (gastroesophageal reflux disease)   . Hyperlipidemia    diet controlled now with 100 pound weight loss  . Hypertension   . Renal cyst 09/03/2017  . Situational depression 07/06/2017  . Splenomegaly   . Thrombocytopenia ([place])    hematology, ?related to chol med (tricor) and glimeripide, just being monitored, above 100,000    Past Surgical History:  Procedure Laterality Date  . BIOPSY  07/09/2016   Procedure: BIOPSY;  Surgeon: [name], MD;  [facility];  Service: Endoscopy;;  duodenal, gastric, esophaageal  . CLOSED MANIPULATION SHOULDER    . COLONOSCOPY  11/2015   Dr. Posey Pronto: cecal bx neg for microscopic colitis. ascending colon polyp (adenomatous)  . ESOPHAGEAL BANDING N/A 08/04/2017   Procedure: ESOPHAGEAL BANDING;  Surgeon: [name], MD;  [facility];  Service: Endoscopy;  Laterality: N/A;  esophageal varices banding  . ESOPHAGEAL BANDING N/A 10/13/2017   Procedure: ESOPHAGEAL BANDING;  Surgeon: [name], MD;  [facility];  Service: Endoscopy;  Laterality: N/A;  .  ESOPHAGOGASTRODUODENOSCOPY N/A 07/09/2016   Procedure: ESOPHAGOGASTRODUODENOSCOPY (EGD);  Surgeon: [name], MD;  [facility];  Service: Endoscopy;  Laterality: N/A;  730  . ESOPHAGOGASTRODUODENOSCOPY N/A 08/04/2017   Procedure: ESOPHAGOGASTRODUODENOSCOPY (EGD);  Surgeon: [name], MD;  [facility];  Service: Endoscopy;  Laterality: N/A;  7:30am  . ESOPHAGOGASTRODUODENOSCOPY N/A 10/13/2017   Dr. [name]: grade 2 esophageal varices status post banding, portal hypertensive gastropathy    Prior to Admission medications   Medication Sig Start Date End Date Taking? Authorizing Provider  acetaminophen (TYLENOL) 500 MG tablet Take 1,000 mg by mouth daily as needed for moderate pain or headache.    Yes [provider]  atorvastatin (LIPITOR) 20 MG tablet Take 1 tablet (20 mg total) by mouth daily. 07/21/18  Yes Hagler, Apolonio Schneiders, MD  benazepril-hydrochlorthiazide (LOTENSIN HCT) 10-12.5 MG tablet Take 1 tablet by mouth daily.  06/13/17  Yes [provider]  cholecalciferol (VITAMIN D) 1000 units tablet Take 2,000 Units by mouth daily.    Yes [provider]  glimepiride (AMARYL) 4 MG tablet Take one pill twice a day as directed Patient taking differently: Take 4 mg by mouth at bedtime.  04/15/18  Yes Hagler, Apolonio Schneiders, MD  LEVEMIR FLEXTOUCH 100 UNIT/ML Pen Inject 36 Units into the skin daily. Takes in evening 12/25/18  Yes [provider]  multivitamin (ONE-A-DAY MEN'S) TABS tablet Take 1 tablet by mouth daily.   Yes  [provider]  neomycin-bacitracin-polymyxin (NEOSPORIN) ointment Apply 1 application topically as needed for wound care.   Yes [provider]    Allergies as of 08/11/2019 - Review Complete 08/11/2019  Allergen Reaction Noted  . Penicillins Other (See Comments) 04/07/2016  . Gluten meal Diarrhea 10/06/2017  . Biaxin [clarithromycin] Tinitus     Family History  Problem Relation Age of Onset  . Other  Mother        chronic diarrhea  . COPD Mother   . Heart disease Mother        died at 50  . Arthritis Mother   . Cancer Mother   . Depression Mother   . Diabetes Mother   . Hyperlipidemia Mother   . Hypertension Mother   . Stroke Mother   . Arthritis Father   . Asthma Father   . Birth defects Father   . Heart disease Father        aortic valve replaced  . Heart disease Maternal Grandmother   . Stroke Maternal Grandmother   . Heart disease Maternal Grandfather   . Stroke Maternal Grandfather   . Diabetes Paternal Grandmother   . Stroke Paternal Grandfather   . Heart disease Paternal Grandfather   . Liver disease Neg Hx   . Colon cancer Neg Hx     Social History   Socioeconomic History  . Marital status: Married    Spouse name: Melissa  . Number of children: 0  . Years of education: 60  . Highest education level: Not on file  Occupational History  . Occupation: Customer service manager, [employer], travels  Social Needs  . Financial resource strain: Not on file  . Food insecurity    Worry: Not on file    Inability: Not on file  . Transportation needs    Medical: Not on file    Non-medical: Not on file  Tobacco Use  . Smoking status: Never Smoker  . Smokeless tobacco: Never Used  Substance and Sexual Activity  . Alcohol use: Yes    Alcohol/week: 0.0 standard drinks    Comment: rare, 1-2 drinks a month, on vacation  . Drug use: No  . Sexual activity: Not Currently  Lifestyle  . Physical activity    Days per week: Not on file    Minutes per session: Not on file  . Stress: Not on file  Relationships  . Social Herbalist on phone: Not on file    Gets together: Not on file    Attends religious service: Not on file    Active member of club or organization: Not on file    Attends meetings of clubs or organizations: Not on file    Relationship status: Not on file  . Intimate partner violence    Fear of current or ex partner: Not on file     Emotionally abused: Not on file    Physically abused: Not on file    Forced sexual activity: Not on file  Other Topics Concern  . Not on file  Social History Narrative   Lives at home with wife Lenna Sciara   One dog.   Has no children.   Eats all food groups.   Works for [employer].        Review of Systems: See HPI, otherwise negative ROS  Physical Exam: BP 121/68   Pulse 63   Temp (!) 96.8 F (36 C) (Temporal)   Ht 5' 7"  (1.702  m)   Wt 235 lb (106.6 kg)   BMI 36.81 kg/m  General:   Alert,  Well-developed, well-nourished, pleasant and cooperative in NAD Lungs:  Clear throughout to auscultation.   No wheezes, crackles, or rhonchi. No acute distress. Heart:  Regular rate and rhythm; no murmurs, clicks, rubs,  or gallops. Abdomen: Non-distended, normal bowel sounds.  Soft and nontender without appreciable mass or hepatosplenomegaly.  Pulses:  Normal pulses noted. Extremities:  Without clubbing or edema.  Impression/Plan: 58 year old gentleman with well compensated Nash/cirrhosis.  Low MELD.  Followed by [facility] hepatology.  Known esophageal varices; needs follow-up EGD as he has not done well with primary prophylaxis with beta-blocker therapy. History of colonic adenoma of surveillance elsewhere  History of celiac disease doing well on a gluten-free diet  Recommendations:  Schedule EGD with band ligation; hx of esophageal varices - Propofol (O730 slot).  The risks, benefits, limitations, alternatives and imponderables have been reviewed with the patient. Potential for esophageal dilation, biopsy, etc. have also been reviewed.  Questions have been answered. All parties agreeable.  Hepatic ultrasound now - hx of varices  Continue gluten free diet  Hold Amaryl dose night before procedure  Decrease Levemir to 20 units the night before procedure.  Further recommendations to follow   Notice: This dictation was prepared with Dragon dictation along  with smaller phrase technology. Any transcriptional errors that result from this process are unintentional and may not be corrected upon review.

## 2019-08-14 ENCOUNTER — Other Ambulatory Visit: Payer: Self-pay | Admitting: *Deleted

## 2019-08-14 ENCOUNTER — Encounter: Payer: Self-pay | Admitting: *Deleted

## 2019-08-14 ENCOUNTER — Telehealth: Payer: Self-pay | Admitting: *Deleted

## 2019-08-14 DIAGNOSIS — K7460 Unspecified cirrhosis of liver: Secondary | ICD-10-CM

## 2019-08-14 DIAGNOSIS — I8500 Esophageal varices without bleeding: Secondary | ICD-10-CM

## 2019-08-14 DIAGNOSIS — I8510 Secondary esophageal varices without bleeding: Secondary | ICD-10-CM

## 2019-08-14 NOTE — Telephone Encounter (Signed)
[place] schedule for 10/19 at 8:30am, arrival time 8:15am, npo midnight.   Called pt, he is aware of [place] appt. He is also scheduled for EGD with esoph banding with propofol on 1/21 at 7:30am. Patient aware will mail instructions with pre-op/covid-19 testing appt to him. Orders entered.

## 2019-08-21 ENCOUNTER — Ambulatory Visit (HOSPITAL_COMMUNITY): Payer: BC Managed Care – PPO

## 2019-08-22 ENCOUNTER — Ambulatory Visit: Payer: BLUE CROSS/BLUE SHIELD | Admitting: Internal Medicine

## 2019-08-24 ENCOUNTER — Ambulatory Visit (HOSPITAL_COMMUNITY)
Admission: RE | Admit: 2019-08-24 | Discharge: 2019-08-24 | Disposition: A | Discharge status: Home / Self Care | Payer: BC Managed Care – PPO | Source: Ambulatory Visit | Attending: Internal Medicine | Admitting: Internal Medicine

## 2019-08-24 ENCOUNTER — Other Ambulatory Visit: Payer: Self-pay

## 2019-08-24 DIAGNOSIS — K7460 Unspecified cirrhosis of liver: Secondary | ICD-10-CM | POA: Diagnosis not present

## 2019-08-24 DIAGNOSIS — I8510 Secondary esophageal varices without bleeding: Secondary | ICD-10-CM | POA: Diagnosis present

## 2019-11-06 ENCOUNTER — Telehealth: Payer: Self-pay | Admitting: *Deleted

## 2019-11-06 NOTE — Telephone Encounter (Signed)
PA approved via [employer]. Auth# [id number] dayes 11/06/19-01/04/20.

## 2019-11-06 NOTE — Addendum Note (Signed)
Addended by: Cheron Every on: 11/06/2019 09:12 AM   Modules accepted: Orders

## 2019-11-17 NOTE — Patient Instructions (Signed)
49    Your procedure is scheduled on: 11/23/2019  Report to [facility] at   6:15  AM.  Call this number if you have problems the morning of surgery: [phone]   Remember:   Do not drink or eat food:After Midnight.      Take these medicines the morning of surgery with A SIP OF WATER: none  Take only 1/2 dose of levemir insulin 18 units the night before procedure   Do not wear jewelry, make-up or nail polish.  Do not wear lotions, powders, or perfumes. You may wear deodorant.                Do not bring valuables to the hospital.  Contacts, dentures or bridgework may not be worn into surgery.  Leave suitcase in the car. After surgery it may be brought to your room.  For patients admitted to the hospital, checkout time is 11:00 AM the day of discharge.   Patients discharged the day of surgery will not be allowed to drive home.                                                                                                                                    [facility] After Please read the instructions outlined below and refer to this sheet in the next few weeks. These discharge instructions provide you with general information on caring for yourself after you leave the hospital. Your doctor may also give you specific instructions. While your treatment has been planned according to the most current medical practices available, unavoidable complications occasionally occur. If you have any problems or questions after discharge, please call your doctor. HOME CARE INSTRUCTIONS Activity  You may resume your regular activity but move at a slower pace for the next 24 hours.   Take frequent rest periods for the next 24 hours.   Walking will help expel (get rid of) the air and reduce the bloated feeling in your abdomen.   No driving for 24 hours (because of the anesthesia (medicine) used during the test).   You may shower.   Do not sign any important legal documents or operate any  machinery for 24 hours (because of the anesthesia used during the test).  Nutrition  Drink plenty of fluids.   You may resume your normal diet.   Begin with a light meal and progress to your normal diet.   Avoid alcoholic beverages for 24 hours or as instructed by your caregiver.  Medications You may resume your normal medications unless your caregiver tells you otherwise. What you can expect today  You may experience abdominal discomfort such as a feeling of fullness or "gas" pains.   You may experience a sore throat for 2 to 3 days. This is normal. Gargling with salt water may help this.  Follow-up Your doctor will discuss the results of your test with you. SEEK IMMEDIATE MEDICAL CARE IF:  You have excessive nausea (feeling sick to your stomach) and/or vomiting.   You have severe abdominal pain and distention (swelling).   You have trouble swallowing.   You have a temperature over 100 F (37.8 C).   You have rectal bleeding or vomiting of blood.  Document Released: 06/02/2004 Document Revised: 10/08/2011 Document Reviewed: 12/14/2007

## 2019-11-21 ENCOUNTER — Encounter (HOSPITAL_COMMUNITY): Payer: Self-pay

## 2019-11-21 ENCOUNTER — Other Ambulatory Visit: Payer: Self-pay

## 2019-11-21 ENCOUNTER — Encounter (HOSPITAL_COMMUNITY)
Admission: RE | Admit: 2019-11-21 | Discharge: 2019-11-21 | Disposition: A | Discharge status: Home / Self Care | Payer: BC Managed Care – PPO | Source: Ambulatory Visit | Attending: Internal Medicine | Admitting: Internal Medicine

## 2019-11-21 ENCOUNTER — Other Ambulatory Visit (HOSPITAL_COMMUNITY)
Admission: RE | Admit: 2019-11-21 | Discharge: 2019-11-21 | Disposition: A | Discharge status: Home / Self Care | Payer: BC Managed Care – PPO | Source: Ambulatory Visit | Attending: Internal Medicine | Admitting: Internal Medicine

## 2019-11-21 DIAGNOSIS — Z01812 Encounter for preprocedural laboratory examination: Secondary | ICD-10-CM | POA: Insufficient documentation

## 2019-11-21 LAB — COMPREHENSIVE METABOLIC PANEL
ALT: 69 U/L — ABNORMAL HIGH (ref 0–44)
AST: 53 U/L — ABNORMAL HIGH (ref 15–41)
Albumin: 3.3 g/dL — ABNORMAL LOW (ref 3.5–5.0)
Alkaline Phosphatase: 110 U/L (ref 38–126)
Anion gap: 7 (ref 5–15)
BUN: 21 mg/dL — ABNORMAL HIGH (ref 6–20)
CO2: 25 mmol/L (ref 22–32)
Calcium: 8.6 mg/dL — ABNORMAL LOW (ref 8.9–10.3)
Chloride: 103 mmol/L (ref 98–111)
Creatinine, Ser: 1.09 mg/dL (ref 0.61–1.24)
GFR calc Af Amer: >60 mL/min (ref >60)
GFR calc non Af Amer: >60 mL/min (ref >60)
Glucose, Bld: 181 mg/dL — ABNORMAL HIGH (ref 70–99)
Potassium: 4.9 mmol/L (ref 3.5–5.1)
Sodium: 135 mmol/L (ref 135–145)
Total Bilirubin: 1.3 mg/dL — ABNORMAL HIGH (ref 0.3–1.2)
Total Protein: 6.7 g/dL (ref 6.5–8.1)

## 2019-11-21 LAB — PROTIME-INR
INR: 1 (ref 0.8–1.2)
Prothrombin Time: 12.8 seconds (ref 11.4–15.2)

## 2019-11-21 LAB — SARS CORONAVIRUS 2 (TAT 6-24 HRS): SARS Coronavirus 2: NEGATIVE

## 2019-11-23 ENCOUNTER — Encounter (HOSPITAL_COMMUNITY)
Admission: RE | Disposition: A | Discharge status: Home / Self Care | Payer: Self-pay | Source: Home / Self Care | Attending: Internal Medicine

## 2019-11-23 ENCOUNTER — Ambulatory Visit (HOSPITAL_COMMUNITY): Payer: BC Managed Care – PPO | Admitting: Anesthesiology

## 2019-11-23 ENCOUNTER — Encounter: Payer: Self-pay | Admitting: Internal Medicine

## 2019-11-23 ENCOUNTER — Ambulatory Visit (HOSPITAL_COMMUNITY)
Admission: RE | Admit: 2019-11-23 | Discharge: 2019-11-23 | Disposition: A | Discharge status: Home / Self Care | Payer: BC Managed Care – PPO | Attending: Internal Medicine | Admitting: Internal Medicine

## 2019-11-23 DIAGNOSIS — Z794 Long term (current) use of insulin: Secondary | ICD-10-CM | POA: Diagnosis not present

## 2019-11-23 DIAGNOSIS — K900 Celiac disease: Secondary | ICD-10-CM | POA: Diagnosis not present

## 2019-11-23 DIAGNOSIS — I10 Essential (primary) hypertension: Secondary | ICD-10-CM | POA: Insufficient documentation

## 2019-11-23 DIAGNOSIS — I8500 Esophageal varices without bleeding: Secondary | ICD-10-CM | POA: Diagnosis not present

## 2019-11-23 DIAGNOSIS — E119 Type 2 diabetes mellitus without complications: Secondary | ICD-10-CM | POA: Insufficient documentation

## 2019-11-23 DIAGNOSIS — Z880 Allergy status to penicillin: Secondary | ICD-10-CM | POA: Insufficient documentation

## 2019-11-23 DIAGNOSIS — I8510 Secondary esophageal varices without bleeding: Secondary | ICD-10-CM

## 2019-11-23 DIAGNOSIS — E785 Hyperlipidemia, unspecified: Secondary | ICD-10-CM | POA: Diagnosis not present

## 2019-11-23 DIAGNOSIS — K319 Disease of stomach and duodenum, unspecified: Secondary | ICD-10-CM | POA: Insufficient documentation

## 2019-11-23 DIAGNOSIS — K7460 Unspecified cirrhosis of liver: Secondary | ICD-10-CM | POA: Diagnosis not present

## 2019-11-23 HISTORY — PX: ESOPHAGOGASTRODUODENOSCOPY: SHX1529

## 2019-11-23 LAB — GLUCOSE, CAPILLARY
Glucose-Capillary: 119 mg/dL — ABNORMAL HIGH (ref 70–99)
Glucose-Capillary: 112 mg/dL — ABNORMAL HIGH (ref 70–99)

## 2019-11-23 SURGERY — ESOPHAGOGASTRODUODENOSCOPY (EGD) WITH PROPOFOL
Anesthesia: General

## 2019-11-23 MED ORDER — PROPOFOL 500 MG/50ML IV EMUL
INTRAVENOUS | Status: DC | PRN
  Administered 2019-11-23 (×2): 150 ug/kg/min via INTRAVENOUS

## 2019-11-23 MED ORDER — PROPOFOL 10 MG/ML IV BOLUS
INTRAVENOUS | Status: DC | PRN
  Administered 2019-11-23 (×2): 20 mg via INTRAVENOUS

## 2019-11-23 MED ORDER — GLYCOPYRROLATE PF 0.2 MG/ML IJ SOSY
PREFILLED_SYRINGE | INTRAMUSCULAR | Status: AC
  Filled 2019-11-23: qty 1

## 2019-11-23 MED ORDER — KETAMINE HCL 10 MG/ML IJ SOLN
INTRAMUSCULAR | Status: DC | PRN
  Administered 2019-11-23: 20 mg via INTRAVENOUS

## 2019-11-23 MED ORDER — LACTATED RINGERS IV SOLN: INTRAVENOUS | Status: DC | PRN

## 2019-11-23 MED ORDER — CHLORHEXIDINE GLUCONATE CLOTH 2 % EX PADS: 6.0000 | MEDICATED_PAD | Freq: Once | CUTANEOUS | Status: DC

## 2019-11-23 MED ORDER — KETAMINE HCL 50 MG/5ML IJ SOSY
PREFILLED_SYRINGE | INTRAMUSCULAR | Status: AC
  Filled 2019-11-23: qty 5

## 2019-11-23 MED ORDER — PROPOFOL 10 MG/ML IV BOLUS
INTRAVENOUS | Status: AC
  Filled 2019-11-23: qty 120

## 2019-11-23 MED ORDER — LACTATED RINGERS IV SOLN: Freq: Once | INTRAVENOUS | Status: AC

## 2019-11-23 MED ORDER — GLYCOPYRROLATE 0.2 MG/ML IJ SOLN
INTRAMUSCULAR | Status: DC | PRN
  Administered 2019-11-23: .2 mg via INTRAVENOUS

## 2019-11-23 NOTE — Anesthesia Postprocedure Evaluation (Signed)
Anesthesia Post Note  Patient: Michael Harmon  Procedure(s) Performed: ESOPHAGOGASTRODUODENOSCOPY (EGD) WITH PROPOFOL (N/A ) ESOPHAGEAL BANDING (N/A )  Patient location during evaluation: PACU Anesthesia Type: General Level of consciousness: awake and alert and patient cooperative Pain management: satisfactory to patient Vital Signs Assessment: post-procedure vital signs reviewed and stable Respiratory status: spontaneous breathing Cardiovascular status: stable Postop Assessment: no apparent nausea or vomiting Anesthetic complications: no     Last Vitals:  Vitals:   11/23/19 0658 11/23/19 0757  BP: 138/65 (!) 160/66  Pulse:    Resp:  16  Temp:  36.8 C  SpO2:  97%    Last Pain:  Vitals:   11/23/19 0757  TempSrc:   PainSc: 5                  Keeanna Villafranca

## 2019-11-23 NOTE — H&P (Signed)
@LOGO @   Primary Care Physician:  Patient, No Pcp Per Primary Gastroenterologist:  Dr. [name]  Pre-Procedure History & Physical: HPI:  Michael Harmon is a 59 y.o. male here for follow-up EGD.  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  Historically low meld.  Followed at [facility] transplant.  Here for 2-year follow-up EGD and possible banding as appropriate.  Past Medical History:  Diagnosis Date  . Allergy    nose and sinus problems  . Anemia   . Asthma   . Celiac disease   . Cirrhosis, cryptogenic ([place])    completed Hep A and B vaccines  . DM (diabetes mellitus) ([place])   . GERD (gastroesophageal reflux disease)   . Hyperlipidemia    diet controlled now with 100 pound weight loss  . Hypertension   . Renal cyst 09/03/2017  . Situational depression 07/06/2017  . Splenomegaly   . Thrombocytopenia ([place])    hematology, ?related to chol med (tricor) and glimeripide, just being monitored, above 100,000    Past Surgical History:  Procedure Laterality Date  . BIOPSY  07/09/2016   Procedure: BIOPSY;  Surgeon: [name], MD;  [facility];  Service: Endoscopy;;  duodenal, gastric, esophaageal  . CLOSED MANIPULATION SHOULDER    . COLONOSCOPY  11/2015   Dr. Posey Pronto: cecal bx neg for microscopic colitis. ascending colon polyp (adenomatous)  . ESOPHAGEAL BANDING N/A 08/04/2017   Procedure: ESOPHAGEAL BANDING;  Surgeon: [name], MD;  [facility];  Service: Endoscopy;  Laterality: N/A;  esophageal varices banding  . ESOPHAGEAL BANDING N/A 10/13/2017   Procedure: ESOPHAGEAL BANDING;  Surgeon: [name], MD;  [facility];  Service: Endoscopy;  Laterality: N/A;  . ESOPHAGOGASTRODUODENOSCOPY N/A 07/09/2016   Procedure: ESOPHAGOGASTRODUODENOSCOPY (EGD);  Surgeon: [name], MD;  [facility];  Service: Endoscopy;  Laterality: N/A;  730  . ESOPHAGOGASTRODUODENOSCOPY N/A 08/04/2017   Procedure:  ESOPHAGOGASTRODUODENOSCOPY (EGD);  Surgeon: [name], MD;  [facility];  Service: Endoscopy;  Laterality: N/A;  7:30am  . ESOPHAGOGASTRODUODENOSCOPY N/A 10/13/2017   Dr. [name]: grade 2 esophageal varices status post banding, portal hypertensive gastropathy    Prior to Admission medications   Medication Sig Start Date End Date Taking? Authorizing Provider  acetaminophen (TYLENOL) 500 MG tablet Take 1,000 mg by mouth daily as needed for moderate pain or headache.    Yes [provider]  atorvastatin (LIPITOR) 20 MG tablet Take 1 tablet (20 mg total) by mouth daily. 07/21/18  Yes Hagler, Apolonio Schneiders, MD  benazepril (LOTENSIN) 10 MG tablet Take 10 mg by mouth daily. 10/11/19  Yes [provider]  Ca Carbonate-Mag Hydroxide (ROLAIDS PO) Take 1 tablet by mouth daily as needed (heartburn).   Yes [provider]  Cholecalciferol (VITAMIN D) 50 MCG (2000 UT) tablet Take 2,000 Units by mouth daily.   Yes [provider]  glimepiride (AMARYL) 4 MG tablet Take one pill twice a day as directed Patient taking differently: Take 4 mg by mouth at bedtime.  04/15/18  Yes Hagler, Apolonio Schneiders, MD  hydrochlorothiazide (MICROZIDE) 12.5 MG capsule Take 12.5 mg by mouth daily.   Yes [provider]  LEVEMIR FLEXTOUCH 100 UNIT/ML Pen Inject 36 Units into the skin at bedtime.  12/25/18  Yes [provider]  multivitamin (ONE-A-DAY MEN'S) TABS tablet Take 1 tablet by mouth daily.   Yes [provider]    Allergies as of 08/14/2019 - Review Complete 08/11/2019  Allergen Reaction Noted  . Penicillins Other (See Comments) 04/07/2016  . Gluten meal Diarrhea 10/06/2017  . Biaxin [clarithromycin] Tinitus     Family History  Problem Relation Age of Onset  . Other Mother        chronic diarrhea  . COPD Mother   . Heart disease Mother        died at 25  . Arthritis Mother   . Cancer Mother   . Depression Mother   . Diabetes Mother   .  Hyperlipidemia Mother   . Hypertension Mother   . Stroke Mother   . Arthritis Father   . Asthma Father   . Birth defects Father   . Heart disease Father        aortic valve replaced  . Heart disease Maternal Grandmother   . Stroke Maternal Grandmother   . Heart disease Maternal Grandfather   . Stroke Maternal Grandfather   . Diabetes Paternal Grandmother   . Stroke Paternal Grandfather   . Heart disease Paternal Grandfather   . Liver disease Neg Hx   . Colon cancer Neg Hx     Social History   Socioeconomic History  . Marital status: Married    Spouse name: Melissa  . Number of children: 0  . Years of education: 55  . Highest education level: Not on file  Occupational History  . Occupation: Customer service manager, [employer], travels  Tobacco Use  . Smoking status: Never Smoker  . Smokeless tobacco: Never Used  Substance and Sexual Activity  . Alcohol use: Yes    Alcohol/week: 0.0 standard drinks    Comment: rare, 1-2 drinks a month, on vacation  . Drug use: No  . Sexual activity: Not Currently  Other Topics Concern  . Not on file  Social History Narrative   Lives at home with wife Lenna Sciara   One dog.   Has no children.   Eats all food groups.   Works for [employer] and [employer].       Social Determinants of Health   Financial Resource Strain:   . Difficulty of Paying Living Expenses: Not on file  Food Insecurity:   . Worried About Charity fundraiser in the Last Year: Not on file  . Ran Out of Food in the Last Year: Not on file  Transportation Needs:   . Lack of Transportation (Medical): Not on file  . Lack of Transportation (Non-Medical): Not on file  Physical Activity:   . Days of Exercise per Week: Not on file  . Minutes of Exercise per Session: Not on file  Stress:   . Feeling of Stress : Not on file  Social Connections:   . Frequency of Communication with Friends and Family: Not on file  . Frequency of Social Gatherings with  Friends and Family: Not on file  . Attends Religious Services: Not on file  . Active Member of Clubs or Organizations: Not on file  . Attends Archivist Meetings: Not on file  . Marital Status: Not on file  Intimate Partner Violence:   . Fear of Current or Ex-Partner: Not on file  . Emotionally Abused: Not on file  . Physically Abused: Not on file  . Sexually Abused: Not on file    Review of Systems: See HPI, otherwise negative ROS  Physical Exam: BP 138/65   Pulse 90   Temp 98.7 F (37.1 C) (Oral)   Resp 20   Ht 5' 7"  (1.702 m)  Wt 102.1 kg   SpO2 97%   BMI 35.24 kg/m  General:   Alert,  Well-developed, well-nourished, pleasant and cooperative in NAD Neck:  Supple; no masses or thyromegaly. No significant cervical adenopathy. Lungs:  Clear throughout to auscultation.   No wheezes, crackles, or rhonchi. No acute distress. Heart:  Regular rate and rhythm; no murmurs, clicks, rubs,  or gallops. Abdomen: Non-distended, normal bowel sounds.  Soft and nontender without appreciable mass or hepatosplenomegaly.  Pulses:  Normal pulses noted. Extremities:  Without clubbing or edema.  Impression/Plan: 59 year old gent with Nash/cirrhosis.  History of esophageal varices.  Intolerant to beta blockade.  Here for follow-up EGD last banded a little over 2 years ago. History of celiac disease.  Some flare in symptoms recently. The risks, benefits, limitations, alternatives and imponderables have been reviewed with the patient. Potential for esophageal dilation, biopsy, etc. have also been reviewed.  Questions have been answered. All parties agreeable.    Notice: This dictation was prepared with Dragon dictation along with smaller phrase technology. Any transcriptional errors that result from this process are unintentional and may not be corrected upon review.

## 2019-11-23 NOTE — Transfer of Care (Signed)
Immediate Anesthesia Transfer of Care Note  Patient: Michael Harmon  Procedure(s) Performed: ESOPHAGOGASTRODUODENOSCOPY (EGD) WITH PROPOFOL (N/A ) ESOPHAGEAL BANDING (N/A )  Patient Location: PACU  Anesthesia Type:General  Level of Consciousness: awake, alert  and patient cooperative  Airway & Oxygen Therapy: Patient Spontanous Breathing  Post-op Assessment: Report given to RN and Post -op Vital signs reviewed and stable  Post vital signs: Reviewed and stable  Last Vitals:  Vitals Value Taken Time  BP    Temp 98.2   Pulse    Resp    SpO2      Last Pain:  Vitals:   11/23/19 0655  TempSrc: Oral  PainSc: 0-No pain      Patients Stated Pain Goal: 8 (08/27/47 6282)  Complications: No apparent anesthesia complications  SEE PACU FOR VITAL SIGNS

## 2019-11-23 NOTE — Discharge Instructions (Signed)
EGD Discharge instructions Please read the instructions outlined below and refer to this sheet in the next few weeks. These discharge instructions provide you with general information on caring for yourself after you leave the hospital. Your doctor may also give you specific instructions. While your treatment has been planned according to the most current medical practices available, unavoidable complications occasionally occur. If you have any problems or questions after discharge, please call your doctor. ACTIVITY  You may resume your regular activity but move at a slower pace for the next 24 hours.   Take frequent rest periods for the next 24 hours.   Walking will help expel (get rid of) the air and reduce the bloated feeling in your abdomen.   No driving for 24 hours (because of the anesthesia (medicine) used during the test).   You may shower.   Do not sign any important legal documents or operate any machinery for 24 hours (because of the anesthesia used during the test).  NUTRITION  Drink plenty of fluids.   You may resume your normal diet.   Begin with a light meal and progress to your normal diet.   Avoid alcoholic beverages for 24 hours or as instructed by your caregiver.  MEDICATIONS  You may resume your normal medications unless your caregiver tells you otherwise.  WHAT YOU CAN EXPECT TODAY  You may experience abdominal discomfort such as a feeling of fullness or "gas" pains.  FOLLOW-UP  Your doctor will discuss the results of your test with you.  SEEK IMMEDIATE MEDICAL ATTENTION IF ANY OF THE FOLLOWING OCCUR:  Excessive nausea (feeling sick to your stomach) and/or vomiting.   Severe abdominal pain and distention (swelling).   Trouble swallowing.   Temperature over 101 F (37.8 C).   Rectal bleeding or vomiting of blood.    Esophageal varices or a little larger than seen 2 years ago.  Bands placed.  I recommend you return for repeat EGD in 3 months with  further banding as needed  We will cancel upcoming ultrasound with [place]; have next ultrasound done down at [facility] in September 2021 as discussed  Office visit with [place] in 3 months just prior to next EGD  At patient request, I called James at [phone] -left message on answering service.  Nursing staff to provide contact information for Dr. Benny Lennert -she would be an excellent primary care physician for you.  Her office is 1 block away from the hospital Dr Benny Lennert  [phone]

## 2019-11-23 NOTE — Anesthesia Procedure Notes (Signed)
Date/Time: 11/23/2019 7:34 AM Performed by: Vista Deck, CRNA Pre-anesthesia Checklist: Patient identified, Emergency Drugs available, Suction available, Timeout performed and Patient being monitored Patient Re-evaluated:Patient Re-evaluated prior to induction Oxygen Delivery Method: Nasal Cannula

## 2019-11-23 NOTE — Anesthesia Preprocedure Evaluation (Addendum)
Anesthesia Evaluation  Patient identified by MRN, date of birth, ID band Patient awake    Reviewed: Allergy & Precautions, NPO status , Patient's Chart, lab work & pertinent test results  History of Anesthesia Complications Negative for: history of anesthetic complications  Airway Mallampati: III  TM Distance: >3 FB Neck ROM: Full    Dental  (+) Caps, Dental Advisory Given   Pulmonary asthma ,    Pulmonary exam normal breath sounds clear to auscultation       Cardiovascular hypertension, Pt. on medications Normal cardiovascular exam Rhythm:Regular Rate:Normal     Neuro/Psych PSYCHIATRIC DISORDERS Depression    GI/Hepatic GERD  ,(+) Cirrhosis   Esophageal Varices    ,   Endo/Other  diabetes, Well Controlled, Type 2, Insulin Dependent, Oral Hypoglycemic Agents  Renal/GU Renal disease     Musculoskeletal   Abdominal   Peds  Hematology  (+) anemia , Thrombocytopenia    Anesthesia Other Findings   Reproductive/Obstetrics                            Anesthesia Physical Anesthesia Plan  ASA: III  Anesthesia Plan: General   Post-op Pain Management:    Induction: Intravenous  PONV Risk Score and Plan:   Airway Management Planned: Natural Airway, Nasal Cannula and Simple Face Mask  Additional Equipment:   Intra-op Plan:   Post-operative Plan:   Informed Consent: I have reviewed the patients History and Physical, chart, labs and discussed the procedure including the risks, benefits and alternatives for the proposed anesthesia with the patient or authorized representative who has indicated his/her understanding and acceptance.     Dental advisory given  Plan Discussed with: CRNA and Surgeon  Anesthesia Plan Comments:         Anesthesia Quick Evaluation

## 2019-12-04 HISTORY — PX: ESOPHAGOGASTRODUODENOSCOPY: SHX1529

## 2019-12-21 NOTE — Op Note (Signed)
[facility] Patient Name: Michael Harmon Procedure Date: 11/23/2019 7:03 AM MRN: [id number] Date of Birth: [birth year] Attending MD: Norvel Richards , MD CSN: [id number] Age: 59 Admit Type: Outpatient Procedure:                Upper GI endoscopy Indications:              Surveillance procedure, Esophageal varices Providers:                Norvel Richards, MD, Janeece Riggers, RN, Aram Candela Referring MD:              Medicines:                Propofol per Anesthesia Complications:            No immediate complications. Estimated Blood Loss:     Estimated blood loss was minimal. Procedure:                Pre-Anesthesia Assessment:                           - Prior to the procedure, a History and Physical                            was performed, and patient medications and                            allergies were reviewed. The patient's tolerance of                            previous anesthesia was also reviewed. The risks                            and benefits of the procedure and the sedation                            options and risks were discussed with the patient.                            All questions were answered, and informed consent                            was obtained. Prior Anticoagulants: The patient has                            taken no previous anticoagulant or antiplatelet                            agents. ASA Grade Assessment: III - A patient with                            severe systemic disease. After reviewing the risks  and benefits, the patient was deemed in                            satisfactory condition to undergo the procedure.                           After obtaining informed consent, the endoscope was                            passed under direct vision. Throughout the                            procedure, the patient's blood pressure, pulse, and   oxygen saturations were monitored continuously. The                            [id number]-[id number] ([id number]) scope was introduced through the                            mouth, and advanced to the second part of duodenum.                            The upper GI endoscopy was accomplished without                            difficulty. The patient tolerated the procedure                            well. Scope In: 7:40:42 AM Scope Out: 7:50:43 AM Total Procedure Duration: 0 hours 10 minutes 1 second  Findings:      4 columns of grade 2-3 esophageal varices extending up a good 8 cm into       the tubular esophagus. No bleeding stigmata. Overlying mucosa appeared       otherwise normal. Portal gastropathy found. No ulcer or infiltrating       process. No gastric varices. Patent pylorus. Normal-appearing D1-D2 and       D3. 7 shot bander loaded onto the scope; a net of 6 bands placed on 4       columns of varices. Good hemostasis maintained. Impression:               -Esophageal varices status post banding as                            described. Varices more prominent than seen in                            December 2018. Patient will need a repeat session                            at early interval. Scopic Lee normal-appearing                            small bowel in the setting of celiac disease or  gastropathy Moderate Sedation:      Moderate (conscious) sedation was personally administered by an       anesthesia professional. The following parameters were monitored: oxygen       saturation, heart rate, blood pressure, respiratory rate, EKG, adequacy       of pulmonary ventilation, and response to care. Recommendation:           - Patient has a contact number available for                            emergencies. The signs and symptoms of potential                            delayed complications were discussed with the                            patient. Return to normal  activities tomorrow.                            Written discharge instructions were provided to the                            patient.                           - Advance diet as tolerated.                           - Continue present medications.                           - Repeat upper endoscopy in 3 months for                            retreatment. Dr. Mannie Stabile is gone. Patient needs a                            primary care physician locally. He would like to                            see [employer].                           - Return to GI clinic in 3 months. Keep September                            follow-up appointment at [facility] transplant clinic Procedure Code(s):        --- Professional ---                           [phone], Esophagogastroduodenoscopy, flexible,                            transoral; diagnostic, including collection of  specimen(s) by brushing or washing, when performed                            (separate procedure) Diagnosis Code(s):        --- Professional ---                           I85.00, Esophageal varices without bleeding CPT copyright 2019 American Medical Association. All rights reserved. The codes documented in this report are preliminary and upon coder review may  be revised to meet current compliance requirements. [name]. Caedin Mogan, MD Norvel Richards, MD 12/21/2019 9:03:16 AM This report has been signed electronically. Number of Addenda: 0

## 2020-01-19 ENCOUNTER — Telehealth: Payer: Self-pay | Admitting: Internal Medicine

## 2020-01-19 NOTE — Telephone Encounter (Signed)
Spoke with pt. Pt wanted the name of the PCP [facility] recommended at his discharge of his procedure. Pt was given that info.  Pt also had questions about payment info for his procedure. Pts procedure was approved prior to pt having it done. Pt was told that more info is pending from [facility] medical billing. I gave pt the payment number [phone] to see if they can help him. CM, please advise if that was the right place to send pt.

## 2020-01-19 NOTE — Telephone Encounter (Signed)
Pt has questions for the nurse. [phone]

## 2020-01-20 NOTE — Telephone Encounter (Signed)
The patient should call the number listed on his bill and if a bill has not been sent it means it is still in process.

## 2020-02-13 ENCOUNTER — Encounter: Payer: Self-pay | Admitting: Internal Medicine

## 2020-02-13 ENCOUNTER — Ambulatory Visit: Payer: BC Managed Care – PPO | Admitting: Internal Medicine

## 2020-02-13 ENCOUNTER — Other Ambulatory Visit: Payer: Self-pay

## 2020-02-13 ENCOUNTER — Telehealth: Payer: Self-pay

## 2020-02-13 VITALS — BP 134/65 | HR 67 | Temp 97.0°F | Ht 67.0 in | Wt 234.2 lb

## 2020-02-13 DIAGNOSIS — I8500 Esophageal varices without bleeding: Secondary | ICD-10-CM | POA: Diagnosis not present

## 2020-02-13 NOTE — Progress Notes (Signed)
Primary Care Physician:  Patient, No Pcp Per Primary Gastroenterologist:  Dr. [name]  Pre-Procedure History & Physical: HPI:  Michael Harmon is a 59 y.o. male here for follow-up of Nash/cirrhosis, celiac disease.  Status post EBL January of this year.    Patient had significant odynophagia/dysphagia few weeks following procedure.  It went away.  He did not call us.  Well these days;  MELD 8 back in January.  He notes when he goes out to eat he tends to have diarrhea  - suspects cross-contamination with gluten diarrhea.  When he is at home and he prepares his own meals.  Followed by Dr. Thomasene Mohair endocrinologist in [place].  Last colonoscopy done by Dr. Posey Pronto 2017-adenoma.  He will be due for surveillance colonoscopy here in 2022.  Past Medical History:  Diagnosis Date  . Allergy    nose and sinus problems  . Anemia   . Asthma   . Celiac disease   . 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 DM (diabetes mellitus) ([place])   . GERD (gastroesophageal reflux disease)   . Hyperlipidemia    diet controlled now with 100 pound weight loss  . Hypertension   . Renal cyst 09/03/2017  . Situational depression 07/06/2017  . Splenomegaly   . Thrombocytopenia ([place])    hematology, ?related to chol med (tricor) and glimeripide, just being monitored, above 100,000    Past Surgical History:  Procedure Laterality Date  . BIOPSY  07/09/2016   Procedure: BIOPSY;  Surgeon: [name], MD;  [facility]: [facility];  Service: Endoscopy;;  duodenal, gastric, esophaageal  . CLOSED MANIPULATION SHOULDER    . COLONOSCOPY  11/2015   Dr. Posey Pronto: cecal bx neg for microscopic colitis. ascending colon polyp (adenomatous)  . ESOPHAGEAL BANDING N/A 08/04/2017   Procedure: ESOPHAGEAL BANDING;  Surgeon: [name], MD;  Location: [facility];  Service: Endoscopy;  Laterality: N/A;  esophageal varices banding  . ESOPHAGEAL BANDING N/A 10/13/2017   Procedure: ESOPHAGEAL BANDING;   Surgeon: [name], MD;  [facility]: [facility];  Service: Endoscopy;  Laterality: N/A;  . ESOPHAGOGASTRODUODENOSCOPY N/A 07/09/2016   Procedure: ESOPHAGOGASTRODUODENOSCOPY (EGD);  Surgeon: [name], MD;  [facility]: [facility];  Service: Endoscopy;  Laterality: N/A;  730  . ESOPHAGOGASTRODUODENOSCOPY N/A 08/04/2017   Procedure: ESOPHAGOGASTRODUODENOSCOPY (EGD);  Surgeon: [name], MD;  [facility]: [facility];  Service: Endoscopy;  Laterality: N/A;  7:30am  . ESOPHAGOGASTRODUODENOSCOPY N/A 10/13/2017   Dr. [name]: grade 2 esophageal varices status post banding, portal hypertensive gastropathy    Prior to Admission medications   Medication Sig Start Date End Date Taking? Authorizing Provider  acetaminophen (TYLENOL) 500 MG tablet Take 1,000 mg by mouth daily as needed for moderate pain or headache.    Yes [provider]  atorvastatin (LIPITOR) 20 MG tablet Take 1 tablet (20 mg total) by mouth daily. 07/21/18  Yes Hagler, Apolonio Schneiders, MD  benazepril (LOTENSIN) 10 MG tablet Take 10 mg by mouth daily. 10/11/19  Yes [provider]  Ca Carbonate-Mag Hydroxide (ROLAIDS PO) Take 1 tablet by mouth daily as needed (heartburn).   Yes [provider]  Cholecalciferol (VITAMIN D) 50 MCG (2000 UT) tablet Take 2,000 Units by mouth daily.   Yes [provider]  glimepiride (AMARYL) 4 MG tablet Take one pill twice a day as directed Patient taking differently: Take 4 mg by mouth at bedtime.  04/15/18  Yes Caren Macadam, MD  hydrochlorothiazide (MICROZIDE) 12.5 MG capsule Take 12.5 mg by mouth daily.   Yes [provider]  LEVEMIR FLEXTOUCH 100 UNIT/ML Pen Inject 40 Units into the skin at bedtime.  12/25/18  Yes [provider]  multivitamin (ONE-A-DAY MEN'S) TABS tablet Take 1 tablet by mouth daily.   Yes [provider]    Allergies as of 02/13/2020 - Review Complete 02/13/2020  Allergen Reaction Noted  . Penicillins Other (See  Comments) 04/07/2016  . Gluten meal Diarrhea 10/06/2017  . Biaxin [clarithromycin] Tinitus   . Prednisone Hives 11/21/2019    Family History  Problem Relation Age of Onset  . Other Mother        chronic diarrhea  . COPD Mother   . Heart disease Mother        died at 49  . Arthritis Mother   . Cancer Mother   . Depression Mother   . Diabetes Mother   . Hyperlipidemia Mother   . Hypertension Mother   . Stroke Mother   . Arthritis Father   . Asthma Father   . Birth defects Father   . Heart disease Father        aortic valve replaced  . Heart disease Maternal Grandmother   . Stroke Maternal Grandmother   . Heart disease Maternal Grandfather   . Stroke Maternal Grandfather   . Diabetes Paternal Grandmother   . Stroke Paternal Grandfather   . Heart disease Paternal Grandfather   . Liver disease Neg Hx   . Colon cancer Neg Hx     Social History   Socioeconomic History  . Marital status: Married    Spouse name: Melissa  . Number of children: 0  . Years of education: 76  . Highest education level: Not on file  Occupational History  . Occupation: Customer service manager, [place], travels  Tobacco Use  . Smoking status: Never Smoker  . Smokeless tobacco: Never Used  Substance and Sexual Activity  . Alcohol use: Yes    Alcohol/week: 0.0 standard drinks    Comment: rare, 1-2 drinks a month, on vacation  . Drug use: No  . Sexual activity: Not Currently  Other Topics Concern  . Not on file  Social History Narrative   Lives at home with wife Lenna Sciara   One dog.   Has no children.   Eats all food groups.   Works for [employer] and [employer].       Social Determinants of Health   Financial Resource Strain:   . Difficulty of Paying Living Expenses:   Food Insecurity:   . Worried About Charity fundraiser in the Last Year:   . Arboriculturist in the Last Year:   Transportation Needs:   . Film/video editor (Medical):   Marland Kitchen Lack of  Transportation (Non-Medical):   Physical Activity:   . Days of Exercise per Week:   . Minutes of Exercise per Session:   Stress:   . Feeling of Stress :   Social Connections:   . Frequency of Communication with Friends and Family:   . Frequency of Social Gatherings with Friends and Family:   . Attends Religious Services:   . Active Member of Clubs or Organizations:   . Attends Archivist Meetings:   Marland Kitchen Marital Status:   Intimate Partner Violence:   . Fear of Current or Ex-Partner:   . Emotionally Abused:   Marland Kitchen Physically Abused:   .  Sexually Abused:     Review of Systems: See HPI, otherwise negative ROS  Physical Exam: BP 134/65   Pulse 67   Temp (!) 97 F (36.1 C) (Oral)   Ht 5' 7"  (1.702 m)   Wt 234 lb 3.2 oz (106.2 kg)   BMI 36.68 kg/m  General:   Alert,  pleasant and cooperative in NAD Skin:  Intact without significant lesions or rashes.  No jaundice. Lungs:  Clear throughout to auscultation.   No wheezes, crackles, or rhonchi. No acute distress. Heart:  Regular rate and rhythm; no murmurs, clicks, rubs,  or gallops. Abdomen: Obese.  No shifting dullness.  Abdomen soft and nontender no obvious hepatosplenomegaly appreciated but exam somewhat limited by abdominal girth.  Pulses:  Normal pulses noted. Extremities:  Without clubbing or edema.  Impression/Plan: 59 year old gentleman with Nash/esophageal varices.  Had a rough time after band ligation in January of this year.  He needs to have another session for any potential touchup.  Celiac disease doing very well although he likely is being exposed to gluten when he goes out to eat as described above.  See the hepatologist at [facility] in August or September of this year.  Further liver imaging to be done down there his next appointment  Recommendations:  Schedule an EGD with Band ligation - propofol - esophageal varices within the next 6 weeks (CBC within 2 weeks before procedure).  The risks, benefits, limitations,  alternatives and imponderables have been reviewed with the patient. Potential for esophageal dilation, biopsy, etc. have also been reviewed.  Questions have been answered. All parties agreeable.  Plan for a colonoscopy -hx of polyps - 2022  Use Imodium as needed for occasional diarrhea.  Continue gluten-free diet.  Further recommendations to follow      Notice: This dictation was prepared with Dragon dictation along with smaller phrase technology. Any transcriptional errors that result from this process are unintentional and may not be corrected upon review.

## 2020-02-13 NOTE — Telephone Encounter (Signed)
PA for EGD/var banding submitted via [employer] website. Case approved. Order ID: [id number], valid 02/13/20-04/12/20.

## 2020-02-13 NOTE — Patient Instructions (Addendum)
Schedule an EGD with Band ligation - propofol - esophageal varices within the next 6 weeks (CBC within 2 weeks before procedure)  Plan for a colonoscopy -hx of polyps - 2022  Use Imodium as needed for occasional diarrhea  Further recommendations to follow

## 2020-02-27 ENCOUNTER — Telehealth: Payer: Self-pay | Admitting: Internal Medicine

## 2020-02-27 NOTE — Telephone Encounter (Signed)
Duplicate message. See prior note

## 2020-02-27 NOTE — Telephone Encounter (Signed)
Called patient. He will stay on scheduled for 6/8 right now. He is aware will call if someone happens to cancel later in the day on the schedule.

## 2020-02-27 NOTE — Telephone Encounter (Signed)
Called pt. He stated his pre-op/covid test appt is scheduled for Friday prior to his procedure and this just does not work for him. He states he can't take that much time off work. He is requesting a covid/pre-op appt on Monday prior since he does not have to miss that much work. Patient scheduled for Tuesday 6/8 at 7:30am. Patient states if he can't have this done on Monday before then he can't do the procedure.   Called endo and spoke with Hoyle Sauer. She states since patient is 7:30am case she can't schedule his covid test/pre-op appt for that Monday. She states they have to have at least 24 hours. Prior as long as patient had test done by 12pm prior day before then patient was fine. According to carolyn it has to be at least 24 hrs prior.  Fowarding to CM to see if she could assist. Patient is a diabetic and has to be done in AM.

## 2020-02-27 NOTE — Telephone Encounter (Signed)
Per Threasa Beards switch the patient with the 10:30 case and he will be able to have his COVID test on the Monday prior to procedure.

## 2020-02-27 NOTE — Telephone Encounter (Signed)
PATIENT NEEDS TO BE CALLED ABOUT HIS PROCEDURE

## 2020-02-27 NOTE — Telephone Encounter (Signed)
PLEASE CALL PATIENT [phone] HE NEEDS TO RESCHEDULE HIS PROCEDURE.  SAID THAT RMR IS NOT IN HIS PLAN

## 2020-04-03 NOTE — Patient Instructions (Signed)
26    Your procedure is scheduled on: 04/09/2020  Report to [facility] at   6:15  AM.  Call this number if you have problems the morning of surgery: [phone]   Remember:   Do not drink or eat food:After Midnight.        No Smoking the day of procedure      Take these medicines the morning of surgery with A SIP OF WATER: none  Take only 1/2 dose of Levemir 20 units the night before procedure.  No diabetic medication am of procedure   Do not wear jewelry, make-up or nail polish.  Do not wear lotions, powders, or perfumes. You may wear deodorant.                Do not bring valuables to the hospital.  Contacts, dentures or bridgework may not be worn into surgery.  Leave suitcase in the car. After surgery it may be brought to your room.  For patients admitted to the hospital, checkout time is 11:00 AM the day of discharge.   Patients discharged the day of surgery will not be allowed to drive home. Upper Endoscopy, Adult Upper endoscopy is a procedure to look inside the upper GI (gastrointestinal) tract. The upper GI tract is made up of:  The part of the body that moves food from your mouth to your stomach (esophagus).  The stomach.  The first part of your small intestine (duodenum). This procedure is also called esophagogastroduodenoscopy (EGD) or gastroscopy. In this procedure, your health care provider passes a thin, flexible tube (endoscope) through your mouth and down your esophagus into your stomach. A small camera is attached to the end of the tube. Images from the camera appear on a monitor in the exam room. During this procedure, your health care provider may also remove a small piece of tissue to be sent to a lab and examined under a microscope (biopsy). Your health care provider may do an upper endoscopy to diagnose cancers of the upper GI tract. You may also have this procedure to find the cause of other conditions, such as:  Stomach pain.  Heartburn.  Pain or problems  when swallowing.  Nausea and vomiting.  Stomach bleeding.  Stomach ulcers. Tell a health care provider about:  Any allergies you have.  All medicines you are taking, including vitamins, herbs, eye drops, creams, and over-the-counter medicines.  Any problems you or family members have had with anesthetic medicines.  Any blood disorders you have.  Any surgeries you have had.  Any medical conditions you have.  Whether you are pregnant or may be pregnant. What are the risks? Generally, this is a safe procedure. However, problems may occur, including:  Infection.  Bleeding.  Allergic reactions to medicines.  A tear or hole (perforation) in the esophagus, stomach, or duodenum. What happens before the procedure? Staying hydrated Follow instructions from your health care provider about hydration, which may include:  Up to 2 hours before the procedure - you may continue to drink clear liquids, such as water, clear fruit juice, black coffee, and plain tea.  Eating and drinking restrictions Follow instructions from your health care provider about eating and drinking, which may include:  8 hours before the procedure - stop eating heavy meals or foods, such as meat, fried foods, or fatty foods.  6 hours before the procedure - stop eating light meals or foods, such as toast or cereal.  6 hours before the procedure - stop drinking  milk or drinks that contain milk.  2 hours before the procedure - stop drinking clear liquids. Medicines Ask your health care provider about:  Changing or stopping your regular medicines. This is especially important if you are taking diabetes medicines or blood thinners.  Taking medicines such as aspirin and ibuprofen. These medicines can thin your blood. Do not take these medicines unless your health care provider tells you to take them.  Taking over-the-counter medicines, vitamins, herbs, and supplements. General instructions  Plan to have  someone take you home from the hospital or clinic.  If you will be going home right after the procedure, plan to have someone with you for 24 hours.  Ask your health care provider what steps will be taken to help prevent infection. What happens during the procedure?  1. An IV will be inserted into one of your veins. 2. You may be given one or more of the following: ? A medicine to help you relax (sedative). ? A medicine to numb the throat (local anesthetic). 3. You will lie on your left side on an exam table. 4. Your health care provider will pass the endoscope through your mouth and down your esophagus. 5. Your health care provider will use the scope to check the inside of your esophagus, stomach, and duodenum. Biopsies may be taken. 6. The endoscope will be removed. The procedure may vary among health care providers and hospitals. What happens after the procedure?  Your blood pressure, heart rate, breathing rate, and blood oxygen level will be monitored until you leave the hospital or clinic.  Do not drive for 24 hours if you were given a sedative during your procedure.  When your throat is no longer numb, you may be given some fluids to drink.  It is up to you to get the results of your procedure. Ask your health care provider, or the department that is doing the procedure, when your results will be ready. Summary  Upper endoscopy is a procedure to look inside the upper GI tract.  During the procedure, an IV will be inserted into one of your veins. You may be given a medicine to help you relax.  A medicine will be used to numb your throat.  The endoscope will be passed through your mouth and down your esophagus. This information is not intended to replace advice given to you by your health care provider. Make sure you discuss any questions you have with your health care provider. Document Revised: 04/13/2018 Document Reviewed: 03/21/2018 Elsevier Patient Education  [place] After  Please read the instructions outlined below and refer to this sheet in the next few weeks. These discharge instructions provide you with general information on  caring for yourself after you leave the hospital. Your doctor may also give you specific instructions. While your treatment has been planned according to the most current medical practices available, unavoidable complications occasionally occur. If you have any problems or questions after discharge, please call your doctor. HOME CARE INSTRUCTIONS Activity  You may resume your regular activity but move at a slower pace for the next 24 hours.   Take frequent rest periods for the next 24 hours.   Walking will help expel (get rid of) the air and reduce the bloated feeling in your abdomen.   No driving for 24 hours (because of the anesthesia (medicine) used during the test).   You may shower.   Do not sign any important legal documents or operate any machinery for 24 hours (because of the anesthesia used during the test).  Nutrition  Drink plenty of fluids.   You may resume your normal diet.   Begin with a light meal and progress to your normal diet.   Avoid alcoholic beverages for 24 hours or as instructed by your caregiver.  Medications You may resume your normal medications unless your caregiver tells you otherwise. What you can expect today  You may experience abdominal discomfort such as a feeling of fullness or "gas" pains.   You may experience a sore throat for 2 to 3 days. This is normal. Gargling with salt water may help this.  Follow-up Your doctor will discuss the results of your test with you. SEEK IMMEDIATE MEDICAL CARE IF:  You have excessive nausea (feeling sick to your stomach) and/or vomiting.   You have severe abdominal pain and distention  (swelling).   You have trouble swallowing.   You have a temperature over 100 F (37.8 C).   You have rectal bleeding or vomiting of blood.  Document Released: 06/02/2004 Document Revised: 10/08/2011 Document Reviewed: 12/14/2007  Esophageal Varices  Esophageal varices are enlarged veins in the part of the body that moves food from the mouth to the stomach (esophagus). They develop when extra blood is forced to flow through these veins because the blood's normal pathway is blocked. Without treatment, esophageal varices eventually break and bleed (hemorrhage), which can be life-threatening. What are the causes? This condition may be caused by:  Scarring of the liver (cirrhosis) due to alcoholism. This is the most common cause.  Long-term (chronic) liver disease.  Severe heart failure.  A blood clot in a vein that supplies the liver (portal vein).  A disease that causes inflammation in the organs and other body areas (sarcoidosis).  A parasitic infection that can cause liver damage (schistosomiasis). What are the signs or symptoms? Esophageal varices usually do not cause symptoms unless they start to bleed. Symptoms of bleeding esophageal varices include:  Vomiting material that is bright red or that is black and looks like coffee grounds.  Coughing up blood.  Stools (feces) that look black and tarry.  Dizziness or light-headedness.  Low blood pressure.  Loss of consciousness. How is this diagnosed? This condition is diagnosed with a procedure called endoscopy. During endoscopy, your health care provider uses a flexible tube with a small camera on the end of it (endoscope) to look down your throat and examine your esophagus. You may also have other tests, including:  Imaging tests such as a CT scan or ultrasound.  Blood tests. How is this treated? This condition may be treated with medicines or procedures that reduce pressure in the varices and reduce the risk of  bleeding. Medicines are usually used for varices that are not bleeding. Procedures that may be done for bleeding varices include:  Placing an elastic band around the varices to keep them from bleeding (variceal ligation).  Replacing blood that you have lost due to bleeding. This may include getting a transfusion of blood or parts of blood, such as platelets or clotting factors.  You may be given antibiotic medicine to help prevent infection.  Getting an injection that causes the varices to shrink and close (sclerotherapy). You may also be given medicines that tighten (constrict) blood vessels or change blood flow.  Placing a tube into your esophagus and then passing a balloon through the tube and inflating the balloon (balloon tamponade). The balloon applies pressure to the bleeding veins to help stop the bleeding.  Placing a small tube within the veins in the liver (transjugular intrahepatic portosystemic shunt, TIPS). This decreases blood flow and pressure in the esophageal varices. If other treatments do not work, you may need a liver transplant. Follow these instructions at home:  Take over-the-counter and prescription medicines only as told by your health care provider.  If you were prescribed an antibiotic medicine, take it as told by your health care provider. Do not stop taking the antibiotic even if you start to feel better.  Do not take any NSAIDs (such as aspirin or ibuprofen) before first getting approval from your health care provider.  Do not drink alcohol.  Return to your normal activities as told by your health care provider. Ask your health care provider what activities are safe for you.  Keep all follow-up visits as told by your health care provider. This is important. Contact a health care provider if:  You have abdominal pain.  You are unable to eat or drink. Get help right away if:  You have blood in your stool or vomit.  You have stools that look black or  tarry.  You have chest pain.  You feel dizzy or have low blood pressure.  You lose consciousness. These symptoms may represent a serious problem that is an emergency. Do not wait to see if the symptoms will go away. Get medical help right away. Call your local emergency services (911 in the [place] Do not drive yourself to the hospital. Summary  Esophageal varices are enlarged veins in the esophagus, the part of your body that moves food from your mouth to your stomach.  Without treatment, esophageal varices eventually break and bleed (hemorrhage), which can be life-threatening.  Esophageal varices usually do not cause symptoms unless they start to bleed.  Keep all follow-up visits as told by your health care provider. This is important. This information is not intended to replace advice given to you by your health care provider. Make sure you discuss any questions you have with your health care provider. Document Revised: 10/01/2017 Document Reviewed: 07/21/2017 Elsevier Patient Education  [place].   Esophageal Variceal Ligation, Care After This sheet gives you information about how to care for yourself after your procedure. Your doctor may also give you more specific instructions. If you have problems or questions, contact your doctor. What can I expect after the procedure? After the procedure, it is common to have:  Bleeding.  Pain and soreness in your chest area.  Trouble swallowing. Follow these instructions at home:  Eating and drinking Follow instructions from your doctor about what you can eat or drink.  You will have limits on what you can eat for the first  1-2 days after your procedure.  You will start with a liquid diet. Later, you will start to eat soft foods.  Do not drink alcohol.  Activity  Return to your normal activities as told by your doctor. Ask your doctor what activities are safe for you.  Do not lift anything that is heavier than 10 lb  (4.5 kg), or the limit that you are told, until your doctor says that it is safe.  Do not drive or use heavy machinery while taking prescription pain medicine. General instructions  Take over-the-counter and prescription medicines only as told by your doctor.  Do not use any products that contain nicotine or tobacco, such as cigarettes and e-cigarettes. If you need help quitting, ask your doctor.  Keep all follow-up visits as told by your doctor. This is important. Contact a doctor if:  You have chest pain that lasts for more than 3 days after you go home.  You have trouble swallowing that lasts for more than 3 days after you go home.  You have a fever or chills. Get help right away if:  You have bleeding from your throat.  You have bleeding from your bottom (rectum).  You throw up (vomit) bright red blood.  You are unable to swallow.  You are short of breath.  You have very bad chest pain or back pain. Summary  After the procedure, it is common to have pain, bleeding, and trouble swallowing.  Follow all your home care instructions.  Stay on a liquid or soft diet until your doctor says that you can go back to your normal diet.  Contact a doctor if you have chills, fever, chest pain, or trouble swallowing.  Get help right away if you have bleeding, are unable to swallow, or have very bad chest or back pain. This information is not intended to replace advice given to you by your health care provider. Make sure you discuss any questions you have with your health care provider. Document Revised: 01/25/2018 Document Reviewed: 01/25/2018 Elsevier Patient Education  [place].

## 2020-04-05 ENCOUNTER — Other Ambulatory Visit (HOSPITAL_COMMUNITY)
Admission: RE | Admit: 2020-04-05 | Discharge: 2020-04-05 | Disposition: A | Discharge status: Home / Self Care | Payer: BC Managed Care – PPO | Source: Ambulatory Visit | Attending: Internal Medicine | Admitting: Internal Medicine

## 2020-04-05 ENCOUNTER — Encounter (HOSPITAL_COMMUNITY): Payer: Self-pay

## 2020-04-05 ENCOUNTER — Other Ambulatory Visit: Payer: Self-pay

## 2020-04-05 ENCOUNTER — Encounter (HOSPITAL_COMMUNITY)
Admission: RE | Admit: 2020-04-05 | Discharge: 2020-04-05 | Disposition: A | Discharge status: Home / Self Care | Payer: BC Managed Care – PPO | Source: Ambulatory Visit | Attending: Internal Medicine | Admitting: Internal Medicine

## 2020-04-05 DIAGNOSIS — Z01812 Encounter for preprocedural laboratory examination: Secondary | ICD-10-CM | POA: Insufficient documentation

## 2020-04-05 DIAGNOSIS — Z20822 Contact with and (suspected) exposure to covid-19: Secondary | ICD-10-CM | POA: Insufficient documentation

## 2020-04-05 DIAGNOSIS — I10 Essential (primary) hypertension: Secondary | ICD-10-CM | POA: Diagnosis not present

## 2020-04-05 HISTORY — DX: Anatomical narrow angle, bilateral: H40.033

## 2020-04-05 LAB — CBC WITH DIFFERENTIAL/PLATELET
Abs Immature Granulocytes: 0.01 10*3/uL (ref 0.00–0.07)
Basophils Absolute: 0 10*3/uL (ref 0.0–0.1)
Basophils Relative: 1 %
Eosinophils Absolute: 0.2 10*3/uL (ref 0.0–0.5)
Eosinophils Relative: 7 %
HCT: 37.9 % — ABNORMAL LOW (ref 39.0–52.0)
Hemoglobin: 12.5 g/dL — ABNORMAL LOW (ref 13.0–17.0)
Immature Granulocytes: 0 %
Lymphocytes Relative: 30 %
Lymphs Abs: 1 10*3/uL (ref 0.7–4.0)
MCH: 30.9 pg (ref 26.0–34.0)
MCHC: 33 g/dL (ref 30.0–36.0)
MCV: 93.8 fL (ref 80.0–100.0)
Monocytes Absolute: 0.4 10*3/uL (ref 0.1–1.0)
Monocytes Relative: 12 %
Neutro Abs: 1.6 10*3/uL — ABNORMAL LOW (ref 1.7–7.7)
Neutrophils Relative %: 50 %
Platelets: 55 10*3/uL — ABNORMAL LOW (ref 150–400)
RBC: 4.04 MIL/uL — ABNORMAL LOW (ref 4.22–5.81)
RDW: 13.3 % (ref 11.5–15.5)
WBC: 3.2 10*3/uL — ABNORMAL LOW (ref 4.0–10.5)
nRBC: 0 % (ref 0.0–0.2)

## 2020-04-05 LAB — COMPREHENSIVE METABOLIC PANEL
ALT: 45 U/L — ABNORMAL HIGH (ref 0–44)
AST: 51 U/L — ABNORMAL HIGH (ref 15–41)
Albumin: 3.5 g/dL (ref 3.5–5.0)
Alkaline Phosphatase: 113 U/L (ref 38–126)
Anion gap: 10 (ref 5–15)
BUN: 17 mg/dL (ref 6–20)
CO2: 21 mmol/L — ABNORMAL LOW (ref 22–32)
Calcium: 8.9 mg/dL (ref 8.9–10.3)
Chloride: 107 mmol/L (ref 98–111)
Creatinine, Ser: 1.11 mg/dL (ref 0.61–1.24)
GFR calc Af Amer: >60 mL/min (ref >60)
GFR calc non Af Amer: >60 mL/min (ref >60)
Glucose, Bld: 184 mg/dL — ABNORMAL HIGH (ref 70–99)
Potassium: 4.6 mmol/L (ref 3.5–5.1)
Sodium: 138 mmol/L (ref 135–145)
Total Bilirubin: 0.9 mg/dL (ref 0.3–1.2)
Total Protein: 6.7 g/dL (ref 6.5–8.1)

## 2020-04-05 LAB — SARS CORONAVIRUS 2 (TAT 6-24 HRS): SARS Coronavirus 2: NEGATIVE

## 2020-04-05 LAB — PROTIME-INR
INR: 1.1 (ref 0.8–1.2)
Prothrombin Time: 14.2 seconds (ref 11.4–15.2)

## 2020-04-09 ENCOUNTER — Encounter (HOSPITAL_COMMUNITY)
Admission: RE | Disposition: A | Discharge status: Home / Self Care | Payer: Self-pay | Source: Home / Self Care | Attending: Internal Medicine

## 2020-04-09 ENCOUNTER — Ambulatory Visit (HOSPITAL_COMMUNITY): Payer: BC Managed Care – PPO | Admitting: Anesthesiology

## 2020-04-09 ENCOUNTER — Other Ambulatory Visit: Payer: Self-pay

## 2020-04-09 ENCOUNTER — Ambulatory Visit (HOSPITAL_COMMUNITY)
Admission: RE | Admit: 2020-04-09 | Discharge: 2020-04-09 | Disposition: A | Discharge status: Home / Self Care | Payer: BC Managed Care – PPO | Attending: Internal Medicine | Admitting: Internal Medicine

## 2020-04-09 ENCOUNTER — Encounter (HOSPITAL_COMMUNITY): Payer: Self-pay | Admitting: Internal Medicine

## 2020-04-09 DIAGNOSIS — Z794 Long term (current) use of insulin: Secondary | ICD-10-CM | POA: Diagnosis not present

## 2020-04-09 DIAGNOSIS — Z8249 Family history of ischemic heart disease and other diseases of the circulatory system: Secondary | ICD-10-CM | POA: Diagnosis not present

## 2020-04-09 DIAGNOSIS — Z79899 Other long term (current) drug therapy: Secondary | ICD-10-CM | POA: Insufficient documentation

## 2020-04-09 DIAGNOSIS — K3189 Other diseases of stomach and duodenum: Secondary | ICD-10-CM | POA: Insufficient documentation

## 2020-04-09 DIAGNOSIS — K219 Gastro-esophageal reflux disease without esophagitis: Secondary | ICD-10-CM | POA: Diagnosis not present

## 2020-04-09 DIAGNOSIS — K900 Celiac disease: Secondary | ICD-10-CM | POA: Diagnosis not present

## 2020-04-09 DIAGNOSIS — I8500 Esophageal varices without bleeding: Secondary | ICD-10-CM | POA: Insufficient documentation

## 2020-04-09 DIAGNOSIS — E119 Type 2 diabetes mellitus without complications: Secondary | ICD-10-CM | POA: Diagnosis not present

## 2020-04-09 DIAGNOSIS — I8510 Secondary esophageal varices without bleeding: Secondary | ICD-10-CM

## 2020-04-09 DIAGNOSIS — K7581 Nonalcoholic steatohepatitis (NASH): Secondary | ICD-10-CM | POA: Insufficient documentation

## 2020-04-09 DIAGNOSIS — J45909 Unspecified asthma, uncomplicated: Secondary | ICD-10-CM | POA: Insufficient documentation

## 2020-04-09 DIAGNOSIS — I10 Essential (primary) hypertension: Secondary | ICD-10-CM | POA: Insufficient documentation

## 2020-04-09 DIAGNOSIS — K766 Portal hypertension: Secondary | ICD-10-CM | POA: Insufficient documentation

## 2020-04-09 DIAGNOSIS — K7460 Unspecified cirrhosis of liver: Secondary | ICD-10-CM | POA: Diagnosis not present

## 2020-04-09 DIAGNOSIS — E785 Hyperlipidemia, unspecified: Secondary | ICD-10-CM | POA: Diagnosis not present

## 2020-04-09 DIAGNOSIS — D696 Thrombocytopenia, unspecified: Secondary | ICD-10-CM | POA: Insufficient documentation

## 2020-04-09 HISTORY — PX: ESOPHAGEAL BANDING: SHX5518

## 2020-04-09 HISTORY — PX: ESOPHAGOGASTRODUODENOSCOPY (EGD) WITH PROPOFOL: SHX5813

## 2020-04-09 LAB — GLUCOSE, CAPILLARY: Glucose-Capillary: 102 mg/dL — ABNORMAL HIGH (ref 70–99)

## 2020-04-09 SURGERY — ESOPHAGOGASTRODUODENOSCOPY (EGD) WITH PROPOFOL
Anesthesia: General

## 2020-04-09 MED ORDER — CHLORHEXIDINE GLUCONATE CLOTH 2 % EX PADS: 6.0000 | MEDICATED_PAD | Freq: Once | CUTANEOUS | Status: DC

## 2020-04-09 MED ORDER — PROPOFOL 10 MG/ML IV BOLUS
INTRAVENOUS | Status: AC
  Filled 2020-04-09: qty 40

## 2020-04-09 MED ORDER — LIDOCAINE VISCOUS HCL 2 % MT SOLN
15.0000 mL | Freq: Once | OROMUCOSAL | Status: AC
  Administered 2020-04-09: 15 mL via OROMUCOSAL

## 2020-04-09 MED ORDER — GLYCOPYRROLATE 0.2 MG/ML IJ SOLN
0.2000 mg | Freq: Once | INTRAMUSCULAR | Status: AC
  Administered 2020-04-09: 0.2 mg via INTRAVENOUS
  Filled 2020-04-09: qty 1

## 2020-04-09 MED ORDER — LACTATED RINGERS IV SOLN: Freq: Once | INTRAVENOUS | Status: AC

## 2020-04-09 MED ORDER — KETAMINE HCL 50 MG/5ML IJ SOSY
PREFILLED_SYRINGE | INTRAMUSCULAR | Status: AC
  Filled 2020-04-09: qty 5

## 2020-04-09 MED ORDER — LIDOCAINE VISCOUS HCL 2 % MT SOLN
OROMUCOSAL | Status: AC
  Filled 2020-04-09: qty 15

## 2020-04-09 MED ORDER — PROPOFOL 10 MG/ML IV BOLUS
INTRAVENOUS | Status: DC | PRN
  Administered 2020-04-09: 20 mg via INTRAVENOUS
  Administered 2020-04-09: 40 mg via INTRAVENOUS
  Administered 2020-04-09: 20 mg via INTRAVENOUS
  Administered 2020-04-09: 40 mg via INTRAVENOUS

## 2020-04-09 MED ORDER — PROPOFOL 500 MG/50ML IV EMUL
INTRAVENOUS | Status: DC | PRN
  Administered 2020-04-09: 200 ug/kg/min via INTRAVENOUS

## 2020-04-09 MED ORDER — LACTATED RINGERS IV SOLN: INTRAVENOUS | Status: DC | PRN

## 2020-04-09 NOTE — Op Note (Signed)
[facility] Patient Name: Michael Harmon Procedure Date: 04/09/2020 7:04 AM MRN: [id number] Date of Birth: 1961/10/30 Attending MD: Norvel Richards , MD CSN: [id number] Age: 59 Admit Type: Outpatient Procedure:                Upper GI endoscopy Indications:              Surveillance procedure, Follow-up of esophageal                            varices Providers:                Norvel Richards, MD, Rosina Lowenstein, RN, Aram Candela Referring MD:              Medicines:                Propofol per Anesthesia Complications:            No immediate complications. Estimated Blood Loss:     Estimated blood loss: none. Procedure:                Pre-Anesthesia Assessment:                           - Prior to the procedure, a History and Physical                            was performed, and patient medications and                            allergies were reviewed. The patient's tolerance of                            previous anesthesia was also reviewed. The risks                            and benefits of the procedure and the sedation                            options and risks were discussed with the patient.                            All questions were answered, and informed consent                            was obtained. Prior Anticoagulants: The patient has                            taken no previous anticoagulant or antiplatelet                            agents. ASA Grade Assessment: III - A patient with                            severe systemic  disease. After reviewing the risks                            and benefits, the patient was deemed in                            satisfactory condition to undergo the procedure.                           After obtaining informed consent, the endoscope was                            passed under direct vision. Throughout the                            procedure, the patient's blood pressure, pulse,  and                            oxygen saturations were monitored continuously. The                            GIF-[id number] ([id number]) was introduced through the                            mouth, and advanced to the second part of duodenum. Scope In: 7:39:05 AM Scope Out: 7:50:50 AM Total Procedure Duration: 0 hours 11 minutes 45 seconds  Findings:      (4) columns grade 2/grade 3 varices Distal varix lobulated/grade 3. No       bleeding stigmata. Scar present from prior banding. Bleeding stigmata.       Portal gastropathy. Normal-appearing duodenal mucosa      Scope withdrawn. 7 shot bander loaded up onto scope - reintroduced in       the esophagus and a total of 5 bands placed on 4 columns. Good       hemostasis maintained during the procedure. Impression:               -Grade 2/grade 3 esophageal varices-somewhat                            recalcitrant. Status post esophageal band ligation                            today. Portal gastropathy. Moderate Sedation:      Moderate (conscious) sedation was personally administered by an       anesthesia professional. The following parameters were monitored: oxygen       saturation, heart rate, blood pressure, respiratory rate, EKG, adequacy       of pulmonary ventilation, and response to care. Recommendation:           - Patient has a contact number available for                            emergencies. The signs and symptoms of potential  delayed complications were discussed with the                            patient. Return to normal activities tomorrow.                            Written discharge instructions were provided to the                            patient.                           - Clear liquid diet today. Advance to soft tomorrow                            and then as tolerated. Office visit with [place] in 4                            months, Viscous Xylocaine as needed chest                             discomfort following banding. Procedure Code(s):        --- Professional ---                           [phone], Esophagogastroduodenoscopy, flexible,                            transoral; diagnostic, including collection of                            specimen(s) by brushing or washing, when performed                            (separate procedure) Diagnosis Code(s):        --- Professional ---                           I85.00, Esophageal varices without bleeding CPT copyright 2019 American Medical Association. All rights reserved. The codes documented in this report are preliminary and upon coder review may  be revised to meet current compliance requirements. [name]. Amaranta Mehl, MD Norvel Richards, MD 04/09/2020 8:04:36 AM This report has been signed electronically. Number of Addenda: 0

## 2020-04-09 NOTE — Anesthesia Postprocedure Evaluation (Signed)
Anesthesia Post Note  Patient: CHETAN MEHRING  Procedure(s) Performed: ESOPHAGOGASTRODUODENOSCOPY (EGD) WITH PROPOFOL (N/A ) ESOPHAGEAL BANDING (N/A )  Patient location during evaluation: PACU Anesthesia Type: General Level of consciousness: awake and alert and oriented Pain management: pain level controlled Vital Signs Assessment: post-procedure vital signs reviewed and stable Respiratory status: spontaneous breathing Cardiovascular status: blood pressure returned to baseline and stable Postop Assessment: no apparent nausea or vomiting Anesthetic complications: no     Last Vitals:  Vitals:   04/09/20 0646  BP: 130/61  Pulse: 70  Resp: 17  Temp: 36.6 C  SpO2: 98%    Last Pain:  Vitals:   04/09/20 0732  TempSrc:   PainSc: 0-No pain                 Dhruvan Gullion

## 2020-04-09 NOTE — Anesthesia Preprocedure Evaluation (Signed)
Anesthesia Evaluation  Patient identified by MRN, date of birth, ID band Patient awake    Reviewed: Allergy & Precautions, NPO status , Patient's Chart, lab work & pertinent test results  History of Anesthesia Complications Negative for: history of anesthetic complications  Airway Mallampati: III  TM Distance: >3 FB Neck ROM: Full    Dental  (+) Caps, Dental Advisory Given   Pulmonary asthma ,    Pulmonary exam normal breath sounds clear to auscultation       Cardiovascular Exercise Tolerance: Good hypertension, Pt. on medications Normal cardiovascular exam Rhythm:Regular Rate:Normal     Neuro/Psych PSYCHIATRIC DISORDERS Depression negative neurological ROS     GI/Hepatic GERD  Medicated,(+) Cirrhosis   Esophageal Varices    , Splenomegaly    Endo/Other  diabetes, Well Controlled, Type 2  Renal/GU Renal disease     Musculoskeletal negative musculoskeletal ROS (+)   Abdominal   Peds  Hematology  (+) Blood dyscrasia (thrombocytopenia, platelets - 55,000), anemia ,   Anesthesia Other Findings   Reproductive/Obstetrics negative OB ROS                           Anesthesia Physical Anesthesia Plan  ASA: III  Anesthesia Plan: General   Post-op Pain Management:    Induction: Intravenous  PONV Risk Score and Plan: TIVA  Airway Management Planned: Nasal Cannula, Natural Airway and Simple Face Mask  Additional Equipment:   Intra-op Plan:   Post-operative Plan: Extubation in OR  Informed Consent: I have reviewed the patients History and Physical, chart, labs and discussed the procedure including the risks, benefits and alternatives for the proposed anesthesia with the patient or authorized representative who has indicated his/her understanding and acceptance.     Dental advisory given  Plan Discussed with: CRNA and Surgeon  Anesthesia Plan Comments:         Anesthesia  Quick Evaluation

## 2020-04-09 NOTE — H&P (Signed)
@LOGO @   Primary Care Physician:  Patient, No Pcp Per Primary Gastroenterologist:  Dr. [name]  Pre-Procedure History & Physical: HPI:  Michael Harmon is a 59 y.o. male here for surveillance EGD and possible banding.  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  Last platelet count 55 (recently).  Past Medical History:  Diagnosis Date  . Allergy    nose and sinus problems  . Anemia   . Asthma   . Celiac disease   . 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 DM (diabetes mellitus) ([place])   . GERD (gastroesophageal reflux disease)   . Hyperlipidemia    diet controlled now with 100 pound weight loss  . Hypertension   . Narrow angle glaucoma suspect of both eyes   . Renal cyst 09/03/2017   right  . Situational depression 07/06/2017  . Splenomegaly   . Thrombocytopenia ([place])    hematology, ?related to chol med (tricor) and glimeripide, just being monitored, above 100,000    Past Surgical History:  Procedure Laterality Date  . BIOPSY  07/09/2016   Procedure: BIOPSY;  Surgeon: [name], MD;  [facility];  Service: Endoscopy;;  duodenal, gastric, esophaageal  . CLOSED MANIPULATION SHOULDER    . COLONOSCOPY  11/2015   Dr. Posey Pronto: cecal bx neg for microscopic colitis. ascending colon polyp (adenomatous)  . ESOPHAGEAL BANDING N/A 08/04/2017   Procedure: ESOPHAGEAL BANDING;  Surgeon: [name], MD;  [facility];  Service: Endoscopy;  Laterality: N/A;  esophageal varices banding  . ESOPHAGEAL BANDING N/A 10/13/2017   Procedure: ESOPHAGEAL BANDING;  Surgeon: [name], MD;  [facility];  Service: Endoscopy;  Laterality: N/A;  . ESOPHAGOGASTRODUODENOSCOPY N/A 07/09/2016   Procedure: ESOPHAGOGASTRODUODENOSCOPY (EGD);  Surgeon: [name], MD;  [facility];  Service: Endoscopy;  Laterality: N/A;  730  . ESOPHAGOGASTRODUODENOSCOPY N/A 08/04/2017   Procedure:  ESOPHAGOGASTRODUODENOSCOPY (EGD);  Surgeon: [name], MD;  [facility];  Service: Endoscopy;  Laterality: N/A;  7:30am  . ESOPHAGOGASTRODUODENOSCOPY N/A 10/13/2017   Dr. [name]: grade 2 esophageal varices status post banding, portal hypertensive gastropathy    Prior to Admission medications   Medication Sig Start Date End Date Taking? Authorizing Provider  acetaminophen (TYLENOL) 500 MG tablet Take 500 mg by mouth 2 (two) times daily as needed for moderate pain or headache.    Yes [provider]  atorvastatin (LIPITOR) 20 MG tablet Take 1 tablet (20 mg total) by mouth daily. 07/21/18  Yes Hagler, Apolonio Schneiders, MD  benazepril-hydrochlorthiazide (LOTENSIN HCT) 10-12.5 MG tablet Take 1 tablet by mouth daily.   Yes [provider]  Ca Carbonate-Mag Hydroxide (ROLAIDS PO) Take 1 tablet by mouth 4 (four) times daily as needed (heartburn).    Yes [provider]  Cholecalciferol (VITAMIN D3) 50 MCG (2000 UT) TABS Take 2,000 Units by mouth daily.   Yes [provider]  glimepiride (AMARYL) 4 MG tablet Take one pill twice a day as directed Patient taking differently: Take 4 mg by mouth at bedtime.  04/15/18  Yes Hagler, Apolonio Schneiders, MD  LEVEMIR FLEXTOUCH 100 UNIT/ML Pen Inject 40 Units into the skin at bedtime.  12/25/18  Yes [provider]  Multiple Vitamin (MULTIVITAMIN WITH MINERALS) TABS tablet Take 1 tablet by mouth daily.   Yes [provider]    Allergies as of 02/13/2020 - Review Complete 02/13/2020  Allergen  Reaction Noted  . Penicillins Other (See Comments) 04/07/2016  . Gluten meal Diarrhea 10/06/2017  . Biaxin [clarithromycin] Tinitus   . Prednisone Hives 11/21/2019    Family History  Problem Relation Age of Onset  . Other Mother        chronic diarrhea  . COPD Mother   . Heart disease Mother        died at 24  . Arthritis Mother   . Cancer Mother   . Depression Mother   . Diabetes Mother   . Hyperlipidemia Mother    . Hypertension Mother   . Stroke Mother   . Arthritis Father   . Asthma Father   . Birth defects Father   . Heart disease Father        aortic valve replaced  . Heart disease Maternal Grandmother   . Stroke Maternal Grandmother   . Heart disease Maternal Grandfather   . Stroke Maternal Grandfather   . Diabetes Paternal Grandmother   . Stroke Paternal Grandfather   . Heart disease Paternal Grandfather   . Liver disease Neg Hx   . Colon cancer Neg Hx     Social History   Socioeconomic History  . Marital status: Married    Spouse name: Melissa  . Number of children: 0  . Years of education: 56  . Highest education level: Not on file  Occupational History  . Occupation: Customer service manager, [place], travels  Tobacco Use  . Smoking status: Never Smoker  . Smokeless tobacco: Never Used  Substance and Sexual Activity  . Alcohol use: Yes    Alcohol/week: 0.0 standard drinks    Comment: rare, 1-2 drinks a month, on vacation  . Drug use: No  . Sexual activity: Not Currently  Other Topics Concern  . Not on file  Social History Narrative   Lives at home with wife Lenna Sciara   One dog.   Has no children.   Eats all food groups.   Works for [employer] and [employer].       Social Determinants of Health   Financial Resource Strain:   . Difficulty of Paying Living Expenses:   Food Insecurity:   . Worried About Charity fundraiser in the Last Year:   . Arboriculturist in the Last Year:   Transportation Needs:   . Film/video editor (Medical):   Marland Kitchen Lack of Transportation (Non-Medical):   Physical Activity:   . Days of Exercise per Week:   . Minutes of Exercise per Session:   Stress:   . Feeling of Stress :   Social Connections:   . Frequency of Communication with Friends and Family:   . Frequency of Social Gatherings with Friends and Family:   . Attends Religious Services:   . Active Member of Clubs or Organizations:   . Attends Theatre manager Meetings:   Marland Kitchen Marital Status:   Intimate Partner Violence:   . Fear of Current or Ex-Partner:   . Emotionally Abused:   Marland Kitchen Physically Abused:   . Sexually Abused:     Review of Systems: See HPI, otherwise negative ROS  Physical Exam: BP 130/61   Pulse 70   Temp 97.9 F (36.6 C) (Oral)   Resp 17   Ht 5' 7"  (1.702 m)   SpO2 98%   BMI 35.55 kg/m  General:   Alert,  Well-developed, well-nourished, pleasant and cooperative in NAD Neck:  Supple; no masses or thyromegaly. No significant cervical  adenopathy. Lungs:  Clear throughout to auscultation.   No wheezes, crackles, or rhonchi. No acute distress. Heart:  Regular rate and rhythm; no murmurs, clicks, rubs,  or gallops. Abdomen: Non-distended, normal bowel sounds.  Soft and nontender without appreciable mass or hepatosplenomegaly.  Pulses:  Normal pulses noted. Extremities:  Without clubbing or edema.  Impression/Plan: 59 year old gentleman with [name] cirrhosis secondary portal hypertension known esophageal varices.  History of prior EBL.  Here for surveillance examination. The risks, benefits, limitations, alternatives and imponderables have been reviewed with the patient. Potential for esophageal dilation, biopsy, etc. have also been reviewed.  Questions have been answered. All parties agreeable.     Notice: This dictation was prepared with Dragon dictation along with smaller phrase technology. Any transcriptional errors that result from this process are unintentional and may not be corrected upon review.

## 2020-04-09 NOTE — Transfer of Care (Signed)
Immediate Anesthesia Transfer of Care Note  Patient: Michael Harmon  Procedure(s) Performed: ESOPHAGOGASTRODUODENOSCOPY (EGD) WITH PROPOFOL (N/A ) ESOPHAGEAL BANDING (N/A )  Patient Location: PACU  Anesthesia Type:General  Level of Consciousness: awake  Airway & Oxygen Therapy: Patient Spontanous Breathing  Post-op Assessment: Report given to RN  Post vital signs: Reviewed  Last Vitals:  Vitals Value Taken Time  BP 145/74 04/09/20 0757  Temp    Pulse    Resp 22 04/09/20 0800  SpO2    Vitals shown include unvalidated device data.  Last Pain:  Vitals:   04/09/20 0732  TempSrc:   PainSc: 0-No pain      Patients Stated Pain Goal: 2 (15/05/69 7948)  Complications: No apparent anesthesia complications

## 2020-04-09 NOTE — Discharge Instructions (Signed)
Monitored Anesthesia Care, Care After These instructions provide you with information about caring for yourself after your procedure. Your health care provider may also give you more specific instructions. Your treatment has been planned according to current medical practices, but problems sometimes occur. Call your health care provider if you have any problems or questions after your procedure. What can I expect after the procedure? After your procedure, you may:  Feel sleepy for several hours.  Feel clumsy and have poor balance for several hours.  Feel forgetful about what happened after the procedure.  Have poor judgment for several hours.  Feel nauseous or vomit.  Have a sore throat if you had a breathing tube during the procedure. Follow these instructions at home: For at least 24 hours after the procedure:      Have a responsible adult stay with you. It is important to have someone help care for you until you are awake and alert.  Rest as needed.  Do not: ? Participate in activities in which you could fall or become injured. ? Drive. ? Use heavy machinery. ? Drink alcohol. ? Take sleeping pills or medicines that cause drowsiness. ? Make important decisions or sign legal documents. ? Take care of children on your own. Eating and drinking  Follow the diet that is recommended by your health care provider.  If you vomit, drink water, juice, or soup when you can drink without vomiting.  Make sure you have little or no nausea before eating solid foods. General instructions  Take over-the-counter and prescription medicines only as told by your health care provider.  If you have sleep apnea, surgery and certain medicines can increase your risk for breathing problems. Follow instructions from your health care provider about wearing your sleep device: ? Anytime you are sleeping, including during daytime naps. ? While taking prescription pain medicines, sleeping medicines,  or medicines that make you drowsy.  If you smoke, do not smoke without supervision.  Keep all follow-up visits as told by your health care provider. This is important. Contact a health care provider if:  You keep feeling nauseous or you keep vomiting.  You feel light-headed.  You develop a rash.  You have a fever. Get help right away if:  You have trouble breathing. Summary  For several hours after your procedure, you may feel sleepy and have poor judgment.  Have a responsible adult stay with you for at least 24 hours or until you are awake and alert. This information is not intended to replace advice given to you by your health care provider. Make sure you discuss any questions you have with your health care provider. Document Revised: 01/17/2018 Document Reviewed: 02/09/2016 Elsevier Patient Education  [place]. Esophageal Variceal Ligation  Esophageal varices are large, swollen veins in the tube that connects your throat to your stomach (esophagus). They are caused by problems in the liver. These veins are weak and bleed easily. To treat this condition, the doctor will use a scope (endoscope) to look for the swollen veins. He or she will then put stretchy bands around the veins. This will cut off their blood supply. Over time, the veins will get smaller. This is called esophageal variceal ligation (EVL). EVL is often done again in 1 to 2 weeks. It may have to be done 3 or 4 times to treat all the swollen veins. Tell your doctor about:  Any allergies you have.  All medicines you are taking.  Any problems you or family  members have had with anesthetic medicines.  Any blood disorders you have.  Any surgeries you have had.  Any medical conditions you have.  Whether you are pregnant or may be pregnant. What are the risks? Generally, this is a safe procedure. But, problems may occur, such as:  Bleeding.  Infection.  Sores (ulcers) where the bands were  placed.  A hole in the esophagus.  Chest pain that lasts a short time.  Swallowing problems.  Scarring of the esophagus.  Narrowing of the esophagus. What happens before the procedure? Staying hydrated Follow instructions from your doctor about hydration. These may include:  Up to 2 hours before the procedure - you may continue to drink clear liquids. These include water, clear fruit juice, black coffee, and plain tea. Eating and drinking restrictions Follow instructions from your doctor about eating and drinking. These may include:  8 hours before the procedure - stop eating heavy meals or foods. These include meat, fried foods, or fatty foods.  6 hours before the procedure - stop eating light meals or foods. These include toast or cereal.  6 hours before the procedure - stop drinking milk or drinks that contain milk.  2 hours before the procedure - stop drinking clear liquids. Medicines Ask your doctor about:  Changing or stopping your normal medicines. This is important.  Taking aspirin and ibuprofen. Do not take these medicines unless your doctor tells you to take them.  Taking over-the-counter medicines, vitamins, herbs, and supplements. General information  A scope will be used to look at your swollen veins.  You may need a test to find out your blood type. This is in case you need to be given blood.  Plan to have someone take you home from the hospital or clinic.  Plan to have an adult care for you for at least 24 hours after you go home. This is important.  Ask your doctor what steps will be taken to stop infection. You may need to take antibiotic medicine. What happens during the procedure?  An IV tube will be put into one of your veins.  You will be given one or more of the following: ? A medicine to help you relax (sedative). ? A medicine to make you fall asleep (general anesthetic).  When you are asleep or relaxed, your doctor will place the scope  through your mouth, down your throat, and into your esophagus.  Your doctor will see the swollen veins through the scope.  Your doctor will use a tool to band the veins.  The scope will be taken out when all the veins have been banded. The procedure may vary among doctors and hospitals. What happens after the procedure?  Your blood pressure, heart rate, breathing rate, and blood oxygen level will be monitored until you are ready to go home.  If you do not have bleeding, you may be able to go home soon after the procedure.  Do not drive for 24 hours if you were given a medicine to make you sleep.  You may be given a medicine to keep you from bleeding (beta-blocker). Summary  EVL is a procedure that is used to shrink swollen veins in your esophagus.  Stretchy bands are placed around the veins. This will cut off their blood supply.  The doctor will tell you the things that you need to do before the procedure.  If you do not have bleeding, you may be able to go home soon after the procedure. This information  is not intended to replace advice given to you by your health care provider. Make sure you discuss any questions you have with your health care provider. Document Revised: 01/25/2018 Document Reviewed: 01/25/2018 Elsevier Patient Education  [place]. EGD Discharge instructions Please read the instructions outlined below and refer to this sheet in the next few weeks. These discharge instructions provide you with general information on caring for yourself after you leave the hospital. Your doctor may also give you specific instructions. While your treatment has been planned according to the most current medical practices available, unavoidable complications occasionally occur. If you have any problems or questions after discharge, please call your doctor. ACTIVITY  You may resume your regular activity but move at a slower pace for the next 24 hours.   Take frequent rest  periods for the next 24 hours.   Walking will help expel (get rid of) the air and reduce the bloated feeling in your abdomen.   No driving for 24 hours (because of the anesthesia (medicine) used during the test).   You may shower.   Do not sign any important legal documents or operate any machinery for 24 hours (because of the anesthesia used during the test).  NUTRITION  Drink plenty of fluids.   You may resume your normal diet.   Begin with a light meal and progress to your normal diet.   Avoid alcoholic beverages for 24 hours or as instructed by your caregiver.  MEDICATIONS  You may resume your normal medications unless your caregiver tells you otherwise.  WHAT YOU CAN EXPECT TODAY  You may experience abdominal discomfort such as a feeling of fullness or "gas" pains.  FOLLOW-UP  Your doctor will discuss the results of your test with you.  SEEK IMMEDIATE MEDICAL ATTENTION IF ANY OF THE FOLLOWING OCCUR:  Excessive nausea (feeling sick to your stomach) and/or vomiting.   Severe abdominal pain and distention (swelling).   Trouble swallowing.   Temperature over 101 F (37.8 C).   Rectal bleeding or vomiting of blood.    Esophageal varices were banded today.  They are being a little stubborn to eradicate.  Use viscous Xylocaine as needed if you experience any chest discomfort over the next couple of days (prescription provided).  Follow-up appointment with [place] in 4 months  Keep your appointment at [facility] as scheduled  At patient request I called Smitty Knudsen at [place] to voicemail

## 2020-04-10 ENCOUNTER — Telehealth: Payer: Self-pay

## 2020-04-10 NOTE — Telephone Encounter (Signed)
Thanks for the update

## 2020-04-10 NOTE — Telephone Encounter (Signed)
-----   Message from [name], MD sent at 04/09/2020  8:43 AM EDT ----- Please call him tomorrow and check on him.  He had a rough time with chest pain after the last banding.  I am thinking he should be doing better this go around.  But please just give him a call tomorrow.  Thanks.

## 2020-04-10 NOTE — Telephone Encounter (Signed)
Tried to call pt- NA and voicemail was full.

## 2020-04-10 NOTE — Telephone Encounter (Signed)
Pt called back and said that he is having some pain but it is nothing like it was last time. I advised him that if it got worse or if he had any other problems to call us. He stated he would.

## 2020-04-12 ENCOUNTER — Encounter (HOSPITAL_COMMUNITY): Payer: Self-pay | Admitting: Internal Medicine

## 2020-05-01 ENCOUNTER — Encounter (HOSPITAL_COMMUNITY): Payer: Self-pay | Admitting: Internal Medicine

## 2020-06-07 ENCOUNTER — Emergency Department (HOSPITAL_COMMUNITY): Payer: BC Managed Care – PPO

## 2020-06-07 ENCOUNTER — Encounter (HOSPITAL_COMMUNITY): Payer: Self-pay | Admitting: Emergency Medicine

## 2020-06-07 ENCOUNTER — Other Ambulatory Visit: Payer: Self-pay

## 2020-06-07 ENCOUNTER — Observation Stay (HOSPITAL_COMMUNITY)
Admission: EM | Admit: 2020-06-07 | Discharge: 2020-06-08 | Disposition: A | Discharge status: Home / Self Care | Payer: BC Managed Care – PPO | Attending: Internal Medicine | Admitting: Internal Medicine

## 2020-06-07 DIAGNOSIS — E785 Hyperlipidemia, unspecified: Secondary | ICD-10-CM | POA: Insufficient documentation

## 2020-06-07 DIAGNOSIS — K7460 Unspecified cirrhosis of liver: Secondary | ICD-10-CM | POA: Diagnosis not present

## 2020-06-07 DIAGNOSIS — Z20822 Contact with and (suspected) exposure to covid-19: Secondary | ICD-10-CM | POA: Diagnosis not present

## 2020-06-07 DIAGNOSIS — K900 Celiac disease: Secondary | ICD-10-CM | POA: Insufficient documentation

## 2020-06-07 DIAGNOSIS — D696 Thrombocytopenia, unspecified: Secondary | ICD-10-CM | POA: Insufficient documentation

## 2020-06-07 DIAGNOSIS — Z79899 Other long term (current) drug therapy: Secondary | ICD-10-CM | POA: Insufficient documentation

## 2020-06-07 DIAGNOSIS — Z794 Long term (current) use of insulin: Secondary | ICD-10-CM | POA: Diagnosis not present

## 2020-06-07 DIAGNOSIS — R42 Dizziness and giddiness: Secondary | ICD-10-CM | POA: Diagnosis present

## 2020-06-07 DIAGNOSIS — E1169 Type 2 diabetes mellitus with other specified complication: Secondary | ICD-10-CM

## 2020-06-07 DIAGNOSIS — I10 Essential (primary) hypertension: Secondary | ICD-10-CM | POA: Insufficient documentation

## 2020-06-07 DIAGNOSIS — R55 Syncope and collapse: Secondary | ICD-10-CM | POA: Diagnosis not present

## 2020-06-07 LAB — HEPATIC FUNCTION PANEL
ALT: 45 U/L — ABNORMAL HIGH (ref 0–44)
AST: 51 U/L — ABNORMAL HIGH (ref 15–41)
Albumin: 3.7 g/dL (ref 3.5–5.0)
Alkaline Phosphatase: 104 U/L (ref 38–126)
Bilirubin, Direct: 0.3 mg/dL — ABNORMAL HIGH (ref 0.0–0.2)
Indirect Bilirubin: 0.8 mg/dL (ref 0.3–0.9)
Total Bilirubin: 1.1 mg/dL (ref 0.3–1.2)
Total Protein: 7.2 g/dL (ref 6.5–8.1)

## 2020-06-07 LAB — CBC
HCT: 37.8 % — ABNORMAL LOW (ref 39.0–52.0)
Hemoglobin: 12.5 g/dL — ABNORMAL LOW (ref 13.0–17.0)
MCH: 31 pg (ref 26.0–34.0)
MCHC: 33.1 g/dL (ref 30.0–36.0)
MCV: 93.8 fL (ref 80.0–100.0)
Platelets: 54 10*3/uL — ABNORMAL LOW (ref 150–400)
RBC: 4.03 MIL/uL — ABNORMAL LOW (ref 4.22–5.81)
RDW: 13.3 % (ref 11.5–15.5)
WBC: 4 10*3/uL (ref 4.0–10.5)
nRBC: 0 % (ref 0.0–0.2)

## 2020-06-07 LAB — URINALYSIS, ROUTINE W REFLEX MICROSCOPIC
Bilirubin Urine: NEGATIVE
Glucose, UA: NEGATIVE mg/dL
Hgb urine dipstick: NEGATIVE
Ketones, ur: NEGATIVE mg/dL
Leukocytes,Ua: NEGATIVE
Nitrite: NEGATIVE
Protein, ur: NEGATIVE mg/dL
Specific Gravity, Urine: 1.012 (ref 1.005–1.030)
pH: 5 (ref 5.0–8.0)

## 2020-06-07 LAB — BASIC METABOLIC PANEL
Anion gap: 9 (ref 5–15)
BUN: 20 mg/dL (ref 6–20)
CO2: 19 mmol/L — ABNORMAL LOW (ref 22–32)
Calcium: 8.9 mg/dL (ref 8.9–10.3)
Chloride: 107 mmol/L (ref 98–111)
Creatinine, Ser: 1.12 mg/dL (ref 0.61–1.24)
GFR calc Af Amer: >60 mL/min (ref >60)
GFR calc non Af Amer: >60 mL/min (ref >60)
Glucose, Bld: 237 mg/dL — ABNORMAL HIGH (ref 70–99)
Potassium: 4.6 mmol/L (ref 3.5–5.1)
Sodium: 135 mmol/L (ref 135–145)

## 2020-06-07 LAB — TROPONIN I (HIGH SENSITIVITY): Troponin I (High Sensitivity): 3 ng/L (ref <18)

## 2020-06-07 LAB — LIPASE, BLOOD: Lipase: 90 U/L — ABNORMAL HIGH (ref 11–51)

## 2020-06-07 LAB — CBG MONITORING, ED: Glucose-Capillary: 213 mg/dL — ABNORMAL HIGH (ref 70–99)

## 2020-06-07 MED ORDER — SODIUM CHLORIDE 0.9% FLUSH
3.0000 mL | Freq: Once | INTRAVENOUS | Status: AC
  Administered 2020-06-07: 3 mL via INTRAVENOUS

## 2020-06-07 MED ORDER — SODIUM CHLORIDE 0.9 % IV BOLUS
500.0000 mL | Freq: Once | INTRAVENOUS | Status: AC
  Administered 2020-06-07: 500 mL via INTRAVENOUS

## 2020-06-07 NOTE — ED Triage Notes (Signed)
Pt reports around 1430 today he got light headed and felt like he was going to pass out.  Pt states he feels somewhat less dizzy now but his legs feel weak.  Pt also reports he had diarrhea all last week.

## 2020-06-07 NOTE — ED Provider Notes (Signed)
[facility] EMERGENCY DEPARTMENT Provider Note   CSN: [id number] Arrival date & time: 06/07/20  1659     History Chief Complaint  Patient presents with  . Dizziness    Michael Harmon is a 59 y.o. male history of celiac, diabetes, GERD, hypertension, hyperlipidemia, thrombocytopenia.  Patient presents today for new syncopal episode that occurred at 230.  He reports that he was standing at work no exertion prior.  He reports sudden onset feeling that he was going to pass out, a blacking out sensation.  This lasted several seconds he stumbled forward catching himself on a counter he did not fall to the ground.  He reports this sensation passed and then he fell off balance for the next several hours still reports he feels somewhat off balance now.  He denies any room spinning sensation.  He reports this is never happened before.  Denies fever/chills, headache, vision changes, room spinning, neck pain, difficulty speaking, chest pain/shortness of breath, abdominal pain, nausea/vomiting, numbness/tingling, weakness, cough/hemoptysis or any additional concerns.  Of note patient reports 1 week of nonbloody diarrhea secondary to contaminated food with his celiac disease.  HPI     Past Medical History:  Diagnosis Date  . Allergy    nose and sinus problems  . Anemia   . Asthma   . Celiac disease   . 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 DM (diabetes mellitus) ([place])   . GERD (gastroesophageal reflux disease)   . Hyperlipidemia    diet controlled now with 100 pound weight loss  . Hypertension   . Narrow angle glaucoma suspect of both eyes   . Renal cyst 09/03/2017   right  . Situational depression 07/06/2017  . Splenomegaly   . Thrombocytopenia ([place])    hematology, ?related to chol med (tricor) and glimeripide, just being monitored, above 100,000    Patient Active Problem List   Diagnosis Date Noted  . Noncompliance of patient with dietary regimen  12/14/2017  . Renal cyst 09/03/2017  . Essential hypertension 07/06/2017  . Type 2 diabetes mellitus without complication, without long-term current use of insulin ([place]) 07/06/2017  . HLD (hyperlipidemia) 07/06/2017  . Environmental allergies 07/06/2017  . BPH with obstruction/lower urinary tract symptoms 07/06/2017  . Situational depression 07/06/2017  . Esophageal varices determined by endoscopy ([place]) 07/06/2017  . Portal hypertension ([place]) 06/24/2017  . Vitamin D deficiency 08/06/2016  . Hepatic cirrhosis ([place]) 08/06/2016  . Hallux valgus 07/14/2016  . Thrombocytopenia ([place]) 04/07/2016  . Adult form of celiac disease 04/07/2016  . Hx of adenomatous colonic polyps 04/07/2016    Past Surgical History:  Procedure Laterality Date  . BIOPSY  07/09/2016   Procedure: BIOPSY;  Surgeon: [name], MD;  Location: [facility];  Service: Endoscopy;;  duodenal, gastric, esophaageal  . CLOSED MANIPULATION SHOULDER    . COLONOSCOPY  11/2015   Dr. Posey Pronto: cecal bx neg for microscopic colitis. ascending colon polyp (adenomatous)  . ESOPHAGEAL BANDING N/A 08/04/2017   Procedure: ESOPHAGEAL BANDING;  Surgeon: [name], MD;  Location: [facility];  Service: Endoscopy;  Laterality: N/A;  esophageal varices banding  . ESOPHAGEAL BANDING N/A 10/13/2017   Procedure: ESOPHAGEAL BANDING;  Surgeon: [name], MD;  Location[facility];  Service: Endoscopy;  Laterality: N/A;  . ESOPHAGEAL BANDING N/A 04/09/2020   Procedure: ESOPHAGEAL BANDING;  Surgeon: [name], MD;  [facility];  Service: Endoscopy;  Laterality: N/A;  .  ESOPHAGOGASTRODUODENOSCOPY N/A 07/09/2016   Procedure: ESOPHAGOGASTRODUODENOSCOPY (EGD);  Surgeon: [name], MD;  [facility];  Service: Endoscopy;  Laterality: N/A;  730  . ESOPHAGOGASTRODUODENOSCOPY N/A 08/04/2017   Procedure: ESOPHAGOGASTRODUODENOSCOPY (EGD);  Surgeon: [name], MD;  [facility];  Service: Endoscopy;   Laterality: N/A;  7:30am  . ESOPHAGOGASTRODUODENOSCOPY N/A 10/13/2017   Dr. [name]: grade 2 esophageal varices status post banding, portal hypertensive gastropathy  . ESOPHAGOGASTRODUODENOSCOPY (EGD) WITH PROPOFOL N/A 04/09/2020   Procedure: ESOPHAGOGASTRODUODENOSCOPY (EGD) WITH PROPOFOL;  Surgeon: [name], MD;  [facility];  Service: Endoscopy;  Laterality: N/A;  7:30am       Family History  Problem Relation Age of Onset  . Other Mother        chronic diarrhea  . COPD Mother   . Heart disease Mother        died at 40  . Arthritis Mother   . Cancer Mother   . Depression Mother   . Diabetes Mother   . Hyperlipidemia Mother   . Hypertension Mother   . Stroke Mother   . Arthritis Father   . Asthma Father   . Birth defects Father   . Heart disease Father        aortic valve replaced  . Heart disease Maternal Grandmother   . Stroke Maternal Grandmother   . Heart disease Maternal Grandfather   . Stroke Maternal Grandfather   . Diabetes Paternal Grandmother   . Stroke Paternal Grandfather   . Heart disease Paternal Grandfather   . Liver disease Neg Hx   . Colon cancer Neg Hx     Social History   Tobacco Use  . Smoking status: Never Smoker  . Smokeless tobacco: Never Used  Vaping Use  . Vaping Use: Never used  Substance Use Topics  . Alcohol use: Yes    Alcohol/week: 0.0 standard drinks    Comment: rare, 1-2 drinks a month, on vacation  . Drug use: No    Home Medications Prior to Admission medications   Medication Sig Start Date End Date Taking? Authorizing Provider  acetaminophen (TYLENOL) 500 MG tablet Take 500 mg by mouth 2 (two) times daily as needed for moderate pain or headache.    Yes [provider]  atorvastatin (LIPITOR) 20 MG tablet Take 1 tablet (20 mg total) by mouth daily. 07/21/18  Yes Hagler, Apolonio Schneiders, MD  benazepril-hydrochlorthiazide (LOTENSIN HCT) 10-12.5 MG tablet Take 1 tablet by mouth daily.   Yes [provider]   Ca Carbonate-Mag Hydroxide (ROLAIDS PO) Take 1 tablet by mouth 4 (four) times daily as needed (heartburn).    Yes [provider]  Cholecalciferol (VITAMIN D3) 50 MCG (2000 UT) TABS Take 2,000 Units by mouth daily.   Yes [provider]  insulin lispro (HUMALOG) 100 UNIT/ML injection Inject 10 Units into the skin 3 (three) times daily before meals.   Yes [provider]  LEVEMIR FLEXTOUCH 100 UNIT/ML Pen Inject 36 Units into the skin at bedtime.  12/25/18  Yes [provider]  Multiple Vitamin (MULTIVITAMIN WITH MINERALS) TABS tablet Take 1 tablet by mouth daily.   Yes [provider]  glimepiride (AMARYL) 4 MG tablet Take one pill twice a day as directed Patient taking differently: Take 4 mg by mouth at bedtime.  04/15/18   Caren Macadam, MD    Allergies    Penicillins, Gluten meal, Biaxin [clarithromycin], and Prednisone  Review of Systems   Review of Systems  Ten systems are reviewed and are negative for acute change except as noted in the HPI  Physical Exam Updated Vital Signs BP 118/69   Pulse 64   Temp 98.2 F (36.8 C) (Oral)   Resp 16   Ht 5' 7"  (1.702 m)   Wt 103 kg   SpO2 99%   BMI 35.55 kg/m   Physical Exam Constitutional:      General: He is not in acute distress.    Appearance: Normal appearance. He is well-developed. He is not ill-appearing or diaphoretic.  HENT:     Head: Normocephalic and atraumatic.     Right Ear: External ear normal.     Left Ear: External ear normal.     Mouth/Throat:     Mouth: Mucous membranes are moist.     Pharynx: Oropharynx is clear.  Eyes:     General: Vision grossly intact. Gaze aligned appropriately.     Extraocular Movements: Extraocular movements intact.     Conjunctiva/sclera: Conjunctivae normal.     Pupils: Pupils are equal, round, and reactive to light.     Comments: Extraocular motions intact without pain.  No nystagmus.  Visual fields grossly intact bilaterally.  Neck:      Trachea: Trachea and phonation normal.     Meningeal: Brudzinski's sign absent.  Pulmonary:     Effort: Pulmonary effort is normal. No respiratory distress.  Abdominal:     General: There is no distension.     Palpations: Abdomen is soft.     Tenderness: There is no abdominal tenderness. There is no guarding or rebound.  Musculoskeletal:        General: Normal range of motion.     Cervical back: Normal range of motion and neck supple.  Skin:    General: Skin is warm and dry.  Neurological:     Mental Status: He is alert.     GCS: GCS eye subscore is 4. GCS verbal subscore is 5. GCS motor subscore is 6.     Comments: Mental Status: Alert, oriented, thought content appropriate, able to give a coherent history. Speech fluent without evidence of aphasia. Able to follow 2 step commands without difficulty. Cranial Nerves: II: Peripheral visual fields grossly normal, pupils equal, round, reactive to light III,IV, VI: ptosis not present, extra-ocular motions intact bilaterally V,VII: smile symmetric, eyebrows raise symmetric, facial light touch sensation equal VIII: hearing grossly normal to voice X: uvula elevates symmetrically XI: bilateral shoulder shrug symmetric and strong XII: midline tongue extension without fassiculations Motor: Normal tone. 5/5 strength in upper and lower extremities bilaterally including strong and equal grip strength and dorsiflexion/plantar flexion Sensory: Sensation intact to light touch in all extremities.Negative Romberg.  Deep Tendon Reflexes: 2+ bilateral patella, no clonus of the feet. Cerebellar: normal finger-to-nose with bilateral upper extremities. Normal heel-to -shin balance bilaterally of the lower extremity. No pronator drift.  Gait: normal gait and balance CV: distal pulses palpable throughout  Psychiatric:        Behavior: Behavior normal.     ED Results / Procedures / Treatments   Labs (all labs ordered are listed, but only  abnormal results are displayed) Labs Reviewed  BASIC METABOLIC PANEL - Abnormal; Notable for the following components:      Result Value   CO2 19 (*)    Glucose, Bld 237 (*)    All other components within normal limits  CBC - Abnormal; Notable for the following components:   RBC 4.03 (*)    Hemoglobin 12.5 (*)  HCT 37.8 (*)    Platelets 54 (*)    All other components within normal limits  LIPASE, BLOOD - Abnormal; Notable for the following components:   Lipase 90 (*)    All other components within normal limits  HEPATIC FUNCTION PANEL - Abnormal; Notable for the following components:   AST 51 (*)    ALT 45 (*)    Bilirubin, Direct 0.3 (*)    All other components within normal limits  CBG MONITORING, ED - Abnormal; Notable for the following components:   Glucose-Capillary 213 (*)    All other components within normal limits  SARS CORONAVIRUS 2 BY RT PCR (HOSPITAL ORDER, [facility])  URINALYSIS, ROUTINE W REFLEX MICROSCOPIC  TROPONIN I (HIGH SENSITIVITY)  TROPONIN I (HIGH SENSITIVITY)    EKG EKG Interpretation  Date/Time:  Friday June 07 2020 17:19:48 EDT Ventricular Rate:  73 PR Interval:  186 QRS Duration: 140 QT Interval:  450 QTC Calculation: 495 R Axis:   15 Text Interpretation: Normal sinus rhythm Right bundle branch block Abnormal ECG No previous ECGs available Confirmed by Fredia Sorrow [phone]) on 06/07/2020 5:44:32 PM   Radiology CT HEAD WO CONTRAST  Result Date: 06/07/2020 CLINICAL DATA:  Headache, new or worsening. Additional provided: Patient reports dizziness, dull headache, episode of lightheadedness, leg weakness EXAM: CT HEAD WITHOUT CONTRAST TECHNIQUE: Contiguous axial images were obtained from the base of the skull through the vertex without intravenous contrast. COMPARISON:  No pertinent prior exams are available for comparison. FINDINGS: Brain: Cerebral volume is normal for age. There is no acute intracranial hemorrhage.  No demarcated cortical infarct. No extra-axial fluid collection. No evidence of intracranial mass. No midline shift. Vascular: No hyperdense vessel.  Atherosclerotic calcifications Skull: Normal. Negative for fracture or focal lesion. Sinuses/Orbits: Visualized orbits show no acute finding. Paranasal sinus mucosal thickening. Most notably, there is moderate/severe ethmoid sinus mucosal thickening bilaterally. No significant mastoid effusion IMPRESSION: Unremarkable non-contrast CT appearance of the brain. No evidence of acute intracranial abnormality. Pansinusitis. Most notably, there is moderate/severe ethmoid sinus mucosal thickening. Electronically Signed   By: Kellie Simmering DO   On: 06/07/2020 17:46   DG Chest Portable 1 View  Result Date: 06/07/2020 CLINICAL DATA:  Near syncope EXAM: PORTABLE CHEST 1 VIEW COMPARISON:  None. FINDINGS: Shallow inspiration with atelectatic changes in the lung bases. No consolidation, features of edema, pneumothorax, or effusion. The cardiomediastinal contours are unremarkable. No acute osseous or soft tissue abnormality. Degenerative changes are present in the imaged spine and shoulders. Telemetry leads overlie the chest. IMPRESSION: Atelectatic changes, no other acute cardiopulmonary abnormality Electronically Signed   By: Lovena Le M.D.   On: 06/07/2020 22:21    Procedures Procedures (including critical care time)  Medications Ordered in ED Medications  sodium chloride flush (NS) 0.9 % injection 3 mL (3 mLs Intravenous Given 06/07/20 2213)  sodium chloride 0.9 % bolus 500 mL (0 mLs Intravenous Stopped 06/07/20 2259)    ED Course  I have reviewed the triage vital signs and the nursing notes.  Pertinent labs & imaging results that were available during my care of the patient were reviewed by me and considered in my medical decision making (see chart for details).    MDM Rules/Calculators/A&P                          Additional history obtained  from: 1. Nursing notes from this visit. ----------------------------- I ordered, reviewed and interpreted  labs which include: Urinalysis shows no evidence of infection. LFTs show mild elevation of AST and ALT. Lipase slightly elevated at 90. High-sensitivity opponent within normal limits. CBC shows hemoglobin of 12.5 similar to prior, no leukocytosis. BMP shows hyperglycemia and CO2 19, otherwise within normal limits.  No emergent electrolyte derangement, AKI or gap patient does not appear to be in DKA. CBG 213.  EKG: Normal sinus rhythm Right bundle branch block Abnormal ECG No previous ECGs available Confirmed by Fredia Sorrow [phone]) on 06/07/2020 5:44:32 PM  CXR:  IMPRESSION:  Atelectatic changes, no other acute cardiopulmonary abnormality   CT Head:  IMPRESSION:  Unremarkable non-contrast CT appearance of the brain. No evidence of  acute intracranial abnormality.    Pansinusitis. Most notably, there is moderate/severe ethmoid sinus  mucosal thickening.   Orthostatics negative. - Patient's neurologic exam is reassuring but he does report feeling of off-balance to whenever he is standing. Discussed case with Dr. Rogene Houston, given patient's risk factors and sudden onset of symptoms feel patient would benefit from further evaluation such as MRI, will consult hospitalist for admission as MRI is unavailable at this time of night. Patient was reevaluated he is resting comfortably no acute distress he is agreeable for admission. Consult placed to hospitalist service.  11:50 PM: Discussed case with Dr. Olevia Bowens, patient accepted to hospitalist service.  Note: Portions of this report may have been transcribed using voice recognition software. Every effort was made to ensure accuracy; however, inadvertent computerized transcription errors may still be present. Final Clinical Impression(s) / ED Diagnoses Final diagnoses:  Dizziness  Near syncope    Rx / DC Orders ED Discharge Orders     None       Gari Crown 06/07/20 2354    Fredia Sorrow, MD 06/11/20 [phone]

## 2020-06-08 ENCOUNTER — Observation Stay (HOSPITAL_BASED_OUTPATIENT_CLINIC_OR_DEPARTMENT_OTHER): Payer: BC Managed Care – PPO

## 2020-06-08 ENCOUNTER — Observation Stay (HOSPITAL_COMMUNITY): Payer: BC Managed Care – PPO

## 2020-06-08 ENCOUNTER — Encounter (HOSPITAL_COMMUNITY): Payer: Self-pay | Admitting: Internal Medicine

## 2020-06-08 DIAGNOSIS — R55 Syncope and collapse: Secondary | ICD-10-CM

## 2020-06-08 DIAGNOSIS — K7460 Unspecified cirrhosis of liver: Secondary | ICD-10-CM

## 2020-06-08 DIAGNOSIS — K900 Celiac disease: Secondary | ICD-10-CM

## 2020-06-08 DIAGNOSIS — D696 Thrombocytopenia, unspecified: Secondary | ICD-10-CM

## 2020-06-08 DIAGNOSIS — I10 Essential (primary) hypertension: Secondary | ICD-10-CM

## 2020-06-08 LAB — ECHOCARDIOGRAM COMPLETE
AR max vel: 2.92 cm2
AV Area VTI: 2.74 cm2
AV Area mean vel: 3.02 cm2
AV Mean grad: 5 mmHg
AV Peak grad: 11.8 mmHg
Ao pk vel: 1.72 m/s
Area-P 1/2: 4.71 cm2
Height: 67 in
S' Lateral: 2.51 cm
Weight: 3632 oz

## 2020-06-08 LAB — TROPONIN I (HIGH SENSITIVITY): Troponin I (High Sensitivity): 3 ng/L (ref <18)

## 2020-06-08 LAB — HEMOGLOBIN A1C
Hgb A1c MFr Bld: 7.5 % — ABNORMAL HIGH (ref 4.8–5.6)
Mean Plasma Glucose: 168.55 mg/dL

## 2020-06-08 LAB — CBG MONITORING, ED
Glucose-Capillary: 145 mg/dL — ABNORMAL HIGH (ref 70–99)
Glucose-Capillary: 165 mg/dL — ABNORMAL HIGH (ref 70–99)
Glucose-Capillary: 206 mg/dL — ABNORMAL HIGH (ref 70–99)

## 2020-06-08 LAB — SARS CORONAVIRUS 2 BY RT PCR (HOSPITAL ORDER, PERFORMED IN ~~LOC~~ HOSPITAL LAB): SARS Coronavirus 2: NEGATIVE

## 2020-06-08 MED ORDER — LACTATED RINGERS IV BOLUS
1000.0000 mL | Freq: Once | INTRAVENOUS | Status: AC
  Administered 2020-06-08: 1000 mL via INTRAVENOUS

## 2020-06-08 MED ORDER — ONDANSETRON HCL 4 MG/2ML IJ SOLN: 4.0000 mg | Freq: Four times a day (QID) | INTRAMUSCULAR | Status: DC | PRN

## 2020-06-08 MED ORDER — LISINOPRIL 10 MG PO TABS
10.0000 mg | ORAL_TABLET | Freq: Every day | ORAL | Status: DC
  Administered 2020-06-08: 10 mg via ORAL
  Filled 2020-06-08: qty 1

## 2020-06-08 MED ORDER — HYDROCHLOROTHIAZIDE 12.5 MG PO CAPS
12.5000 mg | ORAL_CAPSULE | Freq: Every day | ORAL | Status: DC
  Administered 2020-06-08: 12.5 mg via ORAL
  Filled 2020-06-08 (×3): qty 1

## 2020-06-08 MED ORDER — INSULIN ASPART 100 UNIT/ML ~~LOC~~ SOLN
0.0000 [IU] | Freq: Three times a day (TID) | SUBCUTANEOUS | Status: DC
  Administered 2020-06-08: 5 [IU] via SUBCUTANEOUS
  Administered 2020-06-08: 3 [IU] via SUBCUTANEOUS
  Filled 2020-06-08 (×2): qty 1

## 2020-06-08 MED ORDER — SODIUM CHLORIDE 0.9% FLUSH
3.0000 mL | Freq: Two times a day (BID) | INTRAVENOUS | Status: DC
  Administered 2020-06-08: 3 mL via INTRAVENOUS

## 2020-06-08 MED ORDER — ATORVASTATIN CALCIUM 10 MG PO TABS
20.0000 mg | ORAL_TABLET | Freq: Every day | ORAL | Status: DC
  Administered 2020-06-08: 20 mg via ORAL
  Filled 2020-06-08: qty 2

## 2020-06-08 MED ORDER — INSULIN DETEMIR 100 UNIT/ML ~~LOC~~ SOLN
36.0000 [IU] | Freq: Every day | SUBCUTANEOUS | Status: DC
  Filled 2020-06-08 (×2): qty 0.36

## 2020-06-08 MED ORDER — ONDANSETRON HCL 4 MG PO TABS: 4.0000 mg | ORAL_TABLET | Freq: Four times a day (QID) | ORAL | Status: DC | PRN

## 2020-06-08 MED ORDER — BENAZEPRIL-HYDROCHLOROTHIAZIDE 10-12.5 MG PO TABS: 1.0000 | ORAL_TABLET | Freq: Every day | ORAL | Status: DC

## 2020-06-08 NOTE — Progress Notes (Signed)
*  PRELIMINARY RESULTS* Echocardiogram 2D Echocardiogram has been performed.  Michael Harmon 06/08/2020, 9:56 AM

## 2020-06-08 NOTE — Discharge Instructions (Signed)
Near-Syncope Near-syncope is when you suddenly get weak or dizzy, or you feel like you might pass out (faint). This may also be called presyncope. This is due to a lack of blood flow to the brain. During an episode of near-syncope, you may:  Feel dizzy, weak, or light-headed.  Feel sick to your stomach (nauseous).  See all white or all black.  See spots.  Have cold, clammy skin. This condition is caused by a sudden decrease in blood flow to the brain. This decrease can result from various causes, but most of those causes are not dangerous. However, near-syncope may be a sign of a serious medical problem, so it is important to seek medical care. Follow these instructions at home: Medicines  Take over-the-counter and prescription medicines only as told by your doctor.  If you are taking blood pressure or heart medicine, get up slowly and spend many minutes getting ready to sit and then stand. This can help with dizziness. General instructions  Be aware of any changes in your symptoms.  Talk with your doctor about your symptoms. You may need to have testing to find the cause of your near-syncope.  If you start to feel like you might pass out, lie down right away. Raise (elevate) your feet above the level of your heart. Breathe deeply and steadily. Wait until all of the symptoms are gone.  Have someone stay with you until you feel stable.  Do not drive, use machinery, or play sports until your doctor says it is okay.  Drink enough fluid to keep your pee (urine) pale yellow.  Keep all follow-up visits as told by your doctor. This is important. Get help right away if you:  Have a seizure.  Have pain in your: ? Chest. ? Belly (abdomen). ? Back.  Faint once or more than once.  Have a very bad headache.  Are bleeding from your mouth or butt.  Have black or tarry poop (stool).  Have a very fast or uneven heartbeat (palpitations).  Are mixed up (confused).  Have trouble  walking.  Are very weak.  Have trouble seeing. These symptoms may be an emergency. Do not wait to see if the symptoms will go away. Get medical help right away. Call your local emergency services (911 in the [place] Do not drive yourself to the hospital. Summary  Near-syncope is when you suddenly get weak or dizzy, or you feel like you might pass out (faint).  This condition is caused by a lack of blood flow to the brain.  Near-syncope may be a sign of a serious medical problem, so it is important to seek medical care. This information is not intended to replace advice given to you by your health care provider. Make sure you discuss any questions you have with your health care provider. Document Revised: 02/10/2019 Document Reviewed: 09/07/2018 Elsevier Patient Education  2020 [employer]  

## 2020-06-08 NOTE — H&P (Signed)
History and Physical    Michael Harmon VEH:[id number] DOB: [birth year] DOA: 06/07/2020  PCP: Patient, No Pcp Per   Patient coming from: Work.  I have personally briefly reviewed patient's old medical records in [place]  Chief Complaint: Unsteadiness and felt like passing out.  HPI: Michael Harmon is a 58 y.o. male with medical history significant of allergies, anemia, asthma, celiac disease, cryptogenic cirrhosis, type 2 diabetes, GERD, hyperlipidemia, hypertension, narrow angle glaucoma, situational depression, splenomegaly, thrombocytopenia who is coming to the emergency department due to becoming unsteady while he was standing at work around 1430, which was followed by an overwhelming feeling of passing out.  He was able to hold onto the counter and subsequently improved enough to go outside and rest until his symptoms got better enough for him to drive.  He subsequently drove himself to the emergency department.  He continues to feel unsteady, but denies spinning sensation, nausea, emesis, blurred vision, headache, focal weakness or numbness.  He denies fever, chills, sore throat, rhinorrhea, chest pain, dyspnea, palpitations, lower extremity edema, abdominal pain, diarrhea, constipation, melena or hematochezia.  No dysuria, frequency or hematuria.  No polyuria, polydipsia, polyphagia or blurred vision.  ED Course: Initial vital signs were 98.2 F, pulse 81, respirations 16, BP 131/52 and O2 sat 92% on room air.  Patient was given IV fluids in the ER.  His urinalysis was normal.  CBC showed a white count of 4.0, hemoglobin 12.3 g/dL and platelets 54.  BMP showed CO2 of 19 mmol/L and glucose of 237 mg/dL.  LFTs show AST of 51 ALT of 45, but were otherwise normal.  Lipase was mildly elevated on 90 units/L.  Troponin x2 were negative.  Imaging: Chest radiograph show atelectatic changes, but no acute cardiopulmonary pathology.  CT head was unremarkable.  Please see images and full regular  report for further detail.  Review of Systems: As per HPI otherwise all other systems reviewed and are negative.  Past Medical History:  Diagnosis Date   Allergy    nose and sinus problems   Anemia    Asthma    Celiac disease    Cirrhosis, cryptogenic (HCC)    completed Hep A and B vaccines   DM (diabetes mellitus) (HCC)    GERD (gastroesophageal reflux disease)    Hyperlipidemia    diet controlled now with 100 pound weight loss   Hypertension    Narrow angle glaucoma suspect of both eyes    Renal cyst 09/03/2017   right   Situational depression 07/06/2017   Splenomegaly    Thrombocytopenia ([place])    hematology, ?related to chol med (tricor) and glimeripide, just being monitored, above 100,000   Past Surgical History:  Procedure Laterality Date   BIOPSY  07/09/2016   Procedure: BIOPSY;  Surgeon: [name], MD;  [facility];  Service: Endoscopy;;  duodenal, gastric, esophaageal   CLOSED MANIPULATION SHOULDER     COLONOSCOPY  11/2015   Dr. Posey Pronto: cecal bx neg for microscopic colitis. ascending colon polyp (adenomatous)   ESOPHAGEAL BANDING N/A 08/04/2017   Procedure: ESOPHAGEAL BANDING;  Surgeon: [name], MD;  [facility];  Service: Endoscopy;  Laterality: N/A;  esophageal varices banding   ESOPHAGEAL BANDING N/A 10/13/2017   Procedure: ESOPHAGEAL BANDING;  Surgeon: [name], MD;  [facility];  Service: Endoscopy;  Laterality: N/A;   ESOPHAGEAL BANDING N/A 04/09/2020   Procedure: ESOPHAGEAL BANDING;  Surgeon: [name], MD;  [facility];  Service: Endoscopy;  Laterality: N/A;   ESOPHAGOGASTRODUODENOSCOPY N/A 07/09/2016   Procedure: ESOPHAGOGASTRODUODENOSCOPY (EGD);  Surgeon: [name], MD;  [facility];  Service: Endoscopy;  Laterality: N/A;  730   ESOPHAGOGASTRODUODENOSCOPY N/A 08/04/2017   Procedure: ESOPHAGOGASTRODUODENOSCOPY (EGD);  Surgeon: [name], MD;  [facility]:  [facility];  Service: Endoscopy;  Laterality: N/A;  7:30am   ESOPHAGOGASTRODUODENOSCOPY N/A 10/13/2017   Dr. [name]: grade 2 esophageal varices status post banding, portal hypertensive gastropathy   ESOPHAGOGASTRODUODENOSCOPY (EGD) WITH PROPOFOL N/A 04/09/2020   Procedure: ESOPHAGOGASTRODUODENOSCOPY (EGD) WITH PROPOFOL;  Surgeon: [name], MD;  [facility];  Service: Endoscopy;  Laterality: N/A;  7:30am   Social History  reports that he has never smoked. He has never used smokeless tobacco. He reports current alcohol use. He reports that he does not use drugs.  Allergies  Allergen Reactions   Penicillins Other (See Comments)    As a baby, unknown reaction Did it involve swelling of the face/tongue/throat, SOB, or low BP? Unknown Did it involve sudden or severe rash/hives, skin peeling, or any reaction on the inside of your mouth or nose? Unknown Did you need to seek medical attention at a hospital or doctor's office? Unknown When did it last happen?infant allergy If all above answers are NO, may proceed with cephalosporin use. .    Gluten Meal Diarrhea   Biaxin [Clarithromycin] Tinitus    Ears ringing, loss sense of smell   Prednisone Hives    Made blood sugar high   Family History  Problem Relation Age of Onset   Other Mother        chronic diarrhea   COPD Mother    Heart disease Mother        died at 17   Arthritis Mother    Cancer Mother    Depression Mother    Diabetes Mother    Hyperlipidemia Mother    Hypertension Mother    Stroke Mother    Arthritis Father    Asthma Father    Birth defects Father    Heart disease Father        aortic valve replaced   Heart disease Maternal Grandmother    Stroke Maternal Grandmother    Heart disease Maternal Grandfather    Stroke Maternal Grandfather    Diabetes Paternal Grandmother    Stroke Paternal Grandfather    Heart disease Paternal Grandfather    Liver disease Neg  Hx    Colon cancer Neg Hx    Prior to Admission medications   Medication Sig Start Date End Date Taking? Authorizing Provider  acetaminophen (TYLENOL) 500 MG tablet Take 500 mg by mouth 2 (two) times daily as needed for moderate pain or headache.    Yes [provider]  atorvastatin (LIPITOR) 20 MG tablet Take 1 tablet (20 mg total) by mouth daily. 07/21/18  Yes Hagler, Apolonio Schneiders, MD  benazepril-hydrochlorthiazide (LOTENSIN HCT) 10-12.5 MG tablet Take 1 tablet by mouth daily.   Yes [provider]  Ca Carbonate-Mag Hydroxide (ROLAIDS PO) Take 1 tablet by mouth 4 (four) times daily as needed (heartburn).    Yes [provider]  Cholecalciferol (VITAMIN D3) 50 MCG (2000 UT) TABS Take 2,000 Units by mouth daily.   Yes [provider]  insulin lispro (HUMALOG) 100 UNIT/ML injection Inject 10 Units into the skin 3 (three) times daily before meals.   Yes [provider]  LEVEMIR FLEXTOUCH 100 UNIT/ML Pen Inject  36 Units into the skin at bedtime.  12/25/18  Yes [provider]  Multiple Vitamin (MULTIVITAMIN WITH MINERALS) TABS tablet Take 1 tablet by mouth daily.   Yes [provider]  glimepiride (AMARYL) 4 MG tablet Take one pill twice a day as directed Patient taking differently: Take 4 mg by mouth at bedtime.  04/15/18   Caren Macadam, MD   Physical Exam: Vitals:   06/08/20 0000 06/08/20 0030 06/08/20 0100 06/08/20 0130  BP: (!) 113/52 (!) 117/58 123/66 (!) 121/58  Pulse: 63 88 69 69  Resp: 17 (!) 24 16 17   Temp:      TempSrc:      SpO2: 99% 97% 97% 95%  Weight:      Height:       Constitutional: NAD, calm, comfortable Eyes: PERRL, lids and conjunctivae normal ENMT: Mucous membranes are moist. Posterior pharynx clear of any exudate or lesions. Neck: normal, supple, no masses, no thyromegaly Respiratory: clear to auscultation bilaterally, no wheezing, no crackles. Normal respiratory effort. No accessory muscle use.    Cardiovascular: Regular rate and rhythm, no murmurs / rubs / gallops. No extremity edema. 2+ pedal pulses. No carotid bruits.  Abdomen: Obese, no tenderness, no masses palpated. No hepatosplenomegaly. Bowel sounds positive.  Musculoskeletal: no clubbing / cyanosis. Good ROM, no contractures. Normal muscle tone.  Skin: no rashes, lesions, ulcers on limited dermatological examination. Neurologic: CN 2-12 grossly intact. Sensation intact, DTR normal. Strength 5/5 in all 4.  Psychiatric: Normal judgment and insight. Alert and oriented x 3. Normal mood.   Labs on Admission: I have personally reviewed following labs and imaging studies  CBC: Recent Labs  Lab 06/07/20 1825  WBC 4.0  HGB 12.5*  HCT 37.8*  MCV 93.8  PLT 54*    Basic Metabolic Panel: Recent Labs  Lab 06/07/20 1825  NA 135  K 4.6  CL 107  CO2 19*  GLUCOSE 237*  BUN 20  CREATININE 1.12  CALCIUM 8.9    GFR: Estimated Creatinine Clearance: 80.3 mL/min (by C-G formula based on SCr of 1.12 mg/dL).  Liver Function Tests: Recent Labs  Lab 06/07/20 1825  AST 51*  ALT 45*  ALKPHOS 104  BILITOT 1.1  PROT 7.2  ALBUMIN 3.7    Urine analysis:    Component Value Date/Time   COLORURINE YELLOW 06/07/2020 2150   APPEARANCEUR CLEAR 06/07/2020 2150   LABSPEC 1.012 06/07/2020 2150   PHURINE 5.0 06/07/2020 2150   GLUCOSEU NEGATIVE 06/07/2020 2150   HGBUR NEGATIVE 06/07/2020 2150   [place] NEGATIVE 06/07/2020 2150   [place] NEGATIVE 06/07/2020 2150   PROTEINUR NEGATIVE 06/07/2020 2150   NITRITE NEGATIVE 06/07/2020 2150   LEUKOCYTESUR NEGATIVE 06/07/2020 2150    Radiological Exams on Admission: CT HEAD WO CONTRAST  Result Date: 06/07/2020 CLINICAL DATA:  Headache, new or worsening. Additional provided: Patient reports dizziness, dull headache, episode of lightheadedness, leg weakness EXAM: CT HEAD WITHOUT CONTRAST TECHNIQUE: Contiguous axial images were obtained from the base of the skull through the vertex  without intravenous contrast. COMPARISON:  No pertinent prior exams are available for comparison. FINDINGS: Brain: Cerebral volume is normal for age. There is no acute intracranial hemorrhage. No demarcated cortical infarct. No extra-axial fluid collection. No evidence of intracranial mass. No midline shift. Vascular: No hyperdense vessel.  Atherosclerotic calcifications Skull: Normal. Negative for fracture or focal lesion. Sinuses/Orbits: Visualized orbits show no acute finding. Paranasal sinus mucosal thickening. Most notably, there is moderate/severe ethmoid sinus mucosal thickening bilaterally. No significant mastoid effusion  IMPRESSION: Unremarkable non-contrast CT appearance of the brain. No evidence of acute intracranial abnormality. Pansinusitis. Most notably, there is moderate/severe ethmoid sinus mucosal thickening. Electronically Signed   By: Kellie Simmering DO   On: 06/07/2020 17:46   DG Chest Portable 1 View  Result Date: 06/07/2020 CLINICAL DATA:  Near syncope EXAM: PORTABLE CHEST 1 VIEW COMPARISON:  None. FINDINGS: Shallow inspiration with atelectatic changes in the lung bases. No consolidation, features of edema, pneumothorax, or effusion. The cardiomediastinal contours are unremarkable. No acute osseous or soft tissue abnormality. Degenerative changes are present in the imaged spine and shoulders. Telemetry leads overlie the chest. IMPRESSION: Atelectatic changes, no other acute cardiopulmonary abnormality Electronically Signed   By: Lovena Le M.D.   On: 06/07/2020 22:21    EKG: Independently reviewed.   Vent. rate 73 BPM PR interval 186 ms QRS duration 140 ms QT/QTc 450/495 ms P-R-T axes 52 15 8 Normal sinus rhythm Right bundle branch block Abnormal ECG No previous tracings.  Assessment/Plan Principal Problem:   Near syncope Observation/telemetry. Gentle/time-limited IV hydration. Check echo cardiogram. Check carotid Doppler. Check MRI brain without contrast.  Active  Problems:   Thrombocytopenia (HCC) Monitor platelet levels.    Hepatic cirrhosis (HCC) As of decompensation.    Essential hypertension Hold antihypertensives. Monitor blood pressure.    Type 2 diabetes mellitus without complication, without long-term current use of insulin (HCC) Carbohydrate gluten-free modified diet. Continue Levemir 36 units at bedtime. CBG monitoring with R ISS.    HLD (hyperlipidemia) On atorvastatin.    Adult form of celiac disease Needs gluten-free diet.  DVT prophylaxis: SCDs. Code Status:   Full code. Family Communication:   Disposition Plan:   Patient is from:  Home.  Anticipated DC to:  Home.  Anticipated DC date:  0 06/08/2020.  Anticipated DC barriers: MRI imaging.  Clinical status. Consults called:   Admission status:  Observation/telemetry.   Severity of Illness: High given near syncopal episode.  Reubin Milan MD [facility]  How to contact the [facility] Attending or Consulting provider [place] or covering provider during after hours [place], for this patient?   1. Check the care team in [facility] and look for a) attending/consulting [facility] provider listed and b) the [facility] team listed 2. Log into [email or web address] and use [place]'s universal password to access. If you do not have the password, please contact the hospital operator. 3. Locate the [facility] provider you are looking for under [facility] and page to a number that you can be directly reached. 4. If you still have difficulty reaching the provider, please page the [facility] (Director on Call) for the Hospitalists listed on amion for assistance.  06/08/2020, 1:40 AM   This document was prepared with Dragon voice recognition software and may contain some unintended transcription errors.

## 2020-06-08 NOTE — Discharge Summary (Signed)
Physician Discharge Summary  MORTIMER BAIR GYF:[id number] DOB: [birth year] DOA: 06/07/2020  PCP: Patient, No Pcp Per  Admit date: 06/07/2020 Discharge date: 06/08/2020  Admitted From: Home Disposition: Home  Recommendations for Outpatient Follow-up:  1. Follow up with PCP in 1-2 weeks 2. Please obtain BMP/CBC in one week 3. Echocardiogram results pending at the time of discharge 4. MRI brain without contrast ordered as outpatient 5. Patient given information for outpatient neurology referral.  Home Health: Equipment/Devices  Discharge Condition: Stable CODE STATUS: Full code Diet recommendation: Heart healthy, carb modified  Brief/Interim Summary: 59 year old male with a history of celiac disease, cryptogenic cirrhosis, diabetes, hypertension, hyperlipidemia, thrombocytopenia, was admitted to the hospital when he became acutely dizzy/lightheaded and felt as though he was going to pass out.  Patient was standing at the time while he was at work.  On arrival to the emergency room work-up has been relatively unrevealing.  Basic labs did not show any significant findings.  Orthostatics were checked and he did not have any significant drops in blood pressure.  Renal function was noted to be stable.  He did receive a bolus of IV fluids and feels significantly improved since then.  Carotid Dopplers did not show any significant stenosis bilaterally.  Echocardiogram was checked with report pending at the time of discharge.  Patient was initially admitted for MRI to evaluate for possible posterior circulation infarct.  Since these services are not available at [facility] over the weekend, plans were made for him to transfer to [facility].  At the time of my exam, patient does not have any neurologic deficits.  His neurologic exam is completely normal and he does not have any further symptoms.  Patient is requesting that MRI be performed with outpatient.  Considering his clinical improvement, this  appears to be reasonable.  He is also been referred to outpatient neurology for further work-up if indicated.  I suspect that his symptoms may be related to volume depletion since he reported some improvement with IV fluids.  Patient is discharged in stable condition.  Discharge Diagnoses:  Principal Problem:   Near syncope Active Problems:   Thrombocytopenia ([place])   Adult form of celiac disease   Hepatic cirrhosis ([place])   Essential hypertension   Type 2 diabetes mellitus without complication, without long-term current use of insulin (HCC)   HLD (hyperlipidemia)    Discharge Instructions  Discharge Instructions    Diet - low sodium heart healthy   Complete by: As directed    Increase activity slowly   Complete by: As directed      Allergies as of 06/08/2020      Reactions   Penicillins Other (See Comments)   As a baby, unknown reaction Did it involve swelling of the face/tongue/throat, SOB, or low BP? Unknown Did it involve sudden or severe rash/hives, skin peeling, or any reaction on the inside of your mouth or nose? Unknown Did you need to seek medical attention at a hospital or doctor's office? Unknown When did it last happen?infant allergy If all above answers are "NO", may proceed with cephalosporin use. .   Gluten Meal Diarrhea   Biaxin [clarithromycin] Tinitus   Ears ringing, loss sense of smell   Prednisone Hives   Made blood sugar high      Medication List    TAKE these medications   acetaminophen 500 MG tablet Commonly known as: TYLENOL Take 500 mg by mouth 2 (two) times daily as needed for moderate pain or headache.  atorvastatin 20 MG tablet Commonly known as: LIPITOR Take 1 tablet (20 mg total) by mouth daily.   benazepril-hydrochlorthiazide 10-12.5 MG tablet Commonly known as: LOTENSIN HCT Take 1 tablet by mouth daily.   glimepiride 4 MG tablet Commonly known as: AMARYL Take one pill twice a day as directed What changed:   how much to  take  how to take this  when to take this  additional instructions   insulin lispro 100 UNIT/ML injection Commonly known as: HUMALOG Inject 10 Units into the skin 3 (three) times daily before meals.   Levemir FlexTouch 100 UNIT/ML FlexPen Generic drug: insulin detemir Inject 36 Units into the skin at bedtime.   multivitamin with minerals Tabs tablet Take 1 tablet by mouth daily.   ROLAIDS PO Take 1 tablet by mouth 4 (four) times daily as needed (heartburn).   Vitamin D3 50 MCG (2000 UT) Tabs Take 2,000 Units by mouth daily.       Follow-up Information    Phillips Odor, MD Follow up.   Specialty: Neurology Why: call for appointment in 2 weeks Contact information: [address] [place] [address] [phone]              Allergies  Allergen Reactions  . Penicillins Other (See Comments)    As a baby, unknown reaction Did it involve swelling of the face/tongue/throat, SOB, or low BP? Unknown Did it involve sudden or severe rash/hives, skin peeling, or any reaction on the inside of your mouth or nose? Unknown Did you need to seek medical attention at a hospital or doctor's office? Unknown When did it last happen?infant allergy If all above answers are "NO", may proceed with cephalosporin use. .   . Gluten Meal Diarrhea  . Biaxin [Clarithromycin] Tinitus    Ears ringing, loss sense of smell  . Prednisone Hives    Made blood sugar high    Consultations:     Procedures/Studies: CT HEAD WO CONTRAST  Result Date: 06/07/2020 CLINICAL DATA:  Headache, new or worsening. Additional provided: Patient reports dizziness, dull headache, episode of lightheadedness, leg weakness EXAM: CT HEAD WITHOUT CONTRAST TECHNIQUE: Contiguous axial images were obtained from the base of the skull through the vertex without intravenous contrast. COMPARISON:  No pertinent prior exams are available for comparison. FINDINGS: Brain: Cerebral volume is normal for age.  There is no acute intracranial hemorrhage. No demarcated cortical infarct. No extra-axial fluid collection. No evidence of intracranial mass. No midline shift. Vascular: No hyperdense vessel.  Atherosclerotic calcifications Skull: Normal. Negative for fracture or focal lesion. Sinuses/Orbits: Visualized orbits show no acute finding. Paranasal sinus mucosal thickening. Most notably, there is moderate/severe ethmoid sinus mucosal thickening bilaterally. No significant mastoid effusion IMPRESSION: Unremarkable non-contrast CT appearance of the brain. No evidence of acute intracranial abnormality. Pansinusitis. Most notably, there is moderate/severe ethmoid sinus mucosal thickening. Electronically Signed   By: Kellie Simmering DO   On: 06/07/2020 17:46   US Carotid Bilateral  Result Date: 06/08/2020 CLINICAL DATA:  Ataxia, hypertension, syncope, hyperlipidemia and diabetes. EXAM: BILATERAL CAROTID DUPLEX ULTRASOUND TECHNIQUE: Pearline Cables scale imaging, color Doppler and duplex ultrasound were performed of bilateral carotid and vertebral arteries in the neck. COMPARISON:  None. FINDINGS: Criteria: Quantification of carotid stenosis is based on velocity parameters that correlate the residual internal carotid diameter with NASCET-based stenosis levels, using the diameter of the distal internal carotid lumen as the denominator for stenosis measurement. The following velocity measurements were obtained: RIGHT ICA:  109/26 cm/sec CCA:  159/18 cm/sec  SYSTOLIC ICA/CCA RATIO:  0.7 ECA:  133 cm/sec LEFT ICA:  101/28 cm/sec CCA:  092/33 cm/sec SYSTOLIC ICA/CCA RATIO:  0.9 ECA:  128 cm/sec RIGHT CAROTID ARTERY: No focal plaque identified. There is no evidence of right-sided carotid stenosis. RIGHT VERTEBRAL ARTERY: Antegrade flow with normal waveform and velocity. LEFT CAROTID ARTERY: Mild amount of partially calcified plaque at the level of the carotid bulb and proximal left ICA. Estimated left ICA stenosis is less than 50%. LEFT  VERTEBRAL ARTERY: Antegrade flow with normal waveform and velocity. IMPRESSION: Mild amount of plaque at the level of the left carotid bulb and proximal left ICA. Estimated left ICA stenosis is less than 50%. Electronically Signed   By: Aletta Edouard M.D.   On: 06/08/2020 12:01   DG Chest Portable 1 View  Result Date: 06/07/2020 CLINICAL DATA:  Near syncope EXAM: PORTABLE CHEST 1 VIEW COMPARISON:  None. FINDINGS: Shallow inspiration with atelectatic changes in the lung bases. No consolidation, features of edema, pneumothorax, or effusion. The cardiomediastinal contours are unremarkable. No acute osseous or soft tissue abnormality. Degenerative changes are present in the imaged spine and shoulders. Telemetry leads overlie the chest. IMPRESSION: Atelectatic changes, no other acute cardiopulmonary abnormality Electronically Signed   By: Lovena Le M.D.   On: 06/07/2020 22:21   ECHOCARDIOGRAM COMPLETE  Result Date: 06/08/2020    ECHOCARDIOGRAM REPORT   Patient Name:   Michael Harmon Date of Exam: 06/08/2020 Medical Rec #:  [id number]       Height:       67.0 in Accession #:    [id number]      Weight:       227.0 lb Date of Birth:  1961-03-07       BSA:          2.134 m Patient Age:    62 years        BP:           125/69 mmHg Patient Gender: M               HR:           77 bpm. Exam Location:  [facility] Procedure: 2D Echo, Cardiac Doppler and Color Doppler Indications:    Syncope 780.2 / R55  History:        Patient has no prior history of Echocardiogram examinations.                 Risk Factors:Hypertension, Diabetes and Dyslipidemia.  Sonographer:    Alvino Chapel RCS Referring Phys: [id number] [place]  Sonographer Comments: Technically difficult study due to poor echo windows, suboptimal parasternal window and suboptimal apical window. IMPRESSIONS  1. Left ventricular ejection fraction, by estimation, is 60 to 65%. The left ventricle has normal function. Left ventricular endocardial border not  optimally defined to evaluate regional wall motion. There is mild left ventricular hypertrophy. Left ventricular diastolic parameters were normal. Evidence of flow acceleration by color flow Doppler however image quality suboptimal to fully assess for LVOT obstruction. No outflow tract obstruction demonstrated by Doppler on this exam.  2. Right ventricular systolic function is normal. The right ventricular size is normal. Tricuspid regurgitation signal is inadequate for assessing PA pressure.  3. The mitral valve is normal in structure. No evidence of mitral valve regurgitation. No evidence of mitral stenosis.  4. The aortic valve is normal in structure. Aortic valve regurgitation is not visualized. No aortic stenosis is present.  5. The inferior vena cava is  normal in size with greater than 50% respiratory variability, suggesting right atrial pressure of 3 mmHg. Conclusion(s)/Recommendation(s): Recommend Definity contrast for future exams, this study was suboptimal for assessment of the LV chamber. FINDINGS  Left Ventricle: Left ventricular ejection fraction, by estimation, is 60 to 65%. The left ventricle has normal function. Left ventricular endocardial border not optimally defined to evaluate regional wall motion. The left ventricular internal cavity size was normal in size. There is mild left ventricular hypertrophy. Left ventricular diastolic parameters were normal. Right Ventricle: The right ventricular size is normal. No increase in right ventricular wall thickness. Right ventricular systolic function is normal. Tricuspid regurgitation signal is inadequate for assessing PA pressure. Left Atrium: Left atrial size was normal in size. Right Atrium: Right atrial size was normal in size. Pericardium: There is no evidence of pericardial effusion. Mitral Valve: The mitral valve is normal in structure. Normal mobility of the mitral valve leaflets. No evidence of mitral valve regurgitation. No evidence of mitral  valve stenosis. Tricuspid Valve: The tricuspid valve is normal in structure. Tricuspid valve regurgitation is trivial. No evidence of tricuspid stenosis. Aortic Valve: The aortic valve is normal in structure. Aortic valve regurgitation is not visualized. No aortic stenosis is present. Aortic valve mean gradient measures 5.0 mmHg. Aortic valve peak gradient measures 11.8 mmHg. Aortic valve area, by VTI measures 2.74 cm. Pulmonic Valve: The pulmonic valve was normal in structure. Pulmonic valve regurgitation is not visualized. No evidence of pulmonic stenosis. Aorta: The aortic root is normal in size and structure. Venous: The inferior vena cava is normal in size with greater than 50% respiratory variability, suggesting right atrial pressure of 3 mmHg. IAS/Shunts: No atrial level shunt detected by color flow Doppler.  LEFT VENTRICLE PLAX 2D LVIDd:         5.13 cm  Diastology LVIDs:         2.51 cm  LV e' lateral:   11.70 cm/s LV PW:         1.03 cm  LV E/e' lateral: 7.0 LV IVS:        1.22 cm  LV e' medial:    8.16 cm/s LVOT diam:     2.00 cm  LV E/e' medial:  10.0 LV SV:         97 LV SV Index:   46 LVOT Area:     3.14 cm  RIGHT VENTRICLE RV S prime:     20.20 cm/s TAPSE (M-mode): 2.6 cm LEFT ATRIUM             Index       RIGHT ATRIUM           Index LA diam:        3.40 cm 1.59 cm/m  RA Area:     14.70 cm LA Vol (A2C):   61.1 ml 28.63 ml/m RA Volume:   34.10 ml  15.98 ml/m LA Vol (A4C):   60.1 ml 28.16 ml/m LA Biplane Vol: 61.1 ml 28.63 ml/m  AORTIC VALVE AV Area (Vmax):    2.92 cm AV Area (Vmean):   3.02 cm AV Area (VTI):     2.74 cm AV Vmax:           172.00 cm/s AV Vmean:          105.000 cm/s AV VTI:            0.356 m AV Peak Grad:      11.8 mmHg AV Mean Grad:  5.0 mmHg LVOT Vmax:         160.00 cm/s LVOT Vmean:        101.000 cm/s LVOT VTI:          0.310 m LVOT/AV VTI ratio: 0.87  AORTA Ao Root diam: 3.10 cm MITRAL VALVE MV Area (PHT): 4.71 cm     SHUNTS MV Decel Time: 161 msec     Systemic  VTI:  0.31 m MV E velocity: 82.00 cm/s   Systemic Diam: 2.00 cm MV A velocity: 112.00 cm/s MV E/A ratio:  0.73 Cherlynn Kaiser MD Electronically signed by Cherlynn Kaiser MD Signature Date/Time: 06/08/2020/1:52:28 PM    Final        Subjective: No further episodes of dizziness or lightheadedness.  Discharge Exam: Vitals:   06/08/20 1130 06/08/20 1200 06/08/20 1230 06/08/20 1235  BP: 123/61 126/63 128/71   Pulse: 74 74 70 79  Resp: 16 18 17 20   Temp:      TempSrc:      SpO2: 97% 99% 98% 99%  Weight:      Height:        General: Pt is alert, awake, not in acute distress Cardiovascular: RRR, S1/S2 +, no rubs, no gallops Respiratory: CTA bilaterally, no wheezing, no rhonchi Abdominal: Soft, NT, ND, bowel sounds + Extremities: no edema, no cyanosis    The results of significant diagnostics from this hospitalization (including imaging, microbiology, ancillary and laboratory) are listed below for reference.     Microbiology: Recent Results (from the past 240 hour(s))  SARS Coronavirus 2 by RT PCR (hospital order, performed in [facility] hospital lab) Nasopharyngeal Nasopharyngeal Swab     Status: None   Collection Time: 06/07/20 11:19 PM   Specimen: Nasopharyngeal Swab  Result Value Ref Range Status   SARS Coronavirus 2 NEGATIVE NEGATIVE Final    Comment: (NOTE) SARS-CoV-2 target nucleic acids are NOT DETECTED.  The SARS-CoV-2 RNA is generally detectable in upper and lower respiratory specimens during the acute phase of infection. The lowest concentration of SARS-CoV-2 viral copies this assay can detect is 250 copies / mL. A negative result does not preclude SARS-CoV-2 infection and should not be used as the sole basis for treatment or other patient management decisions.  A negative result may occur with improper specimen collection / handling, submission of specimen other than nasopharyngeal swab, presence of viral mutation(s) within the areas targeted by this assay, and  inadequate number of viral copies (<250 copies / mL). A negative result must be combined with clinical observations, patient history, and epidemiological information.  Fact Sheet for Patients:   StrictlyIdeas.no  Fact Sheet for Healthcare Providers: BankingDealers.co.za  This test is not yet approved or  cleared by the [place] FDA and has been authorized for detection and/or diagnosis of SARS-CoV-2 by FDA under an Emergency Use Authorization (EUA).  This EUA will remain in effect (meaning this test can be used) for the duration of the COVID-19 declaration under Section 564(b)(1) of the Act, 21 U.S.C. section 360bbb-3(b)(1), unless the authorization is terminated or revoked sooner.  Performed at [facility], [address], [place], [place] [address]      Labs: BNP (last 3 results) No results for input(s): BNP in the last 8760 hours. Basic Metabolic Panel: Recent Labs  Lab 06/07/20 1825  NA 135  K 4.6  CL 107  CO2 19*  GLUCOSE 237*  BUN 20  CREATININE 1.12  CALCIUM 8.9   Liver Function Tests: Recent Labs  Lab  06/07/20 1825  AST 51*  ALT 45*  ALKPHOS 104  BILITOT 1.1  PROT 7.2  ALBUMIN 3.7   Recent Labs  Lab 06/07/20 1825  LIPASE 90*   No results for input(s): AMMONIA in the last 168 hours. CBC: Recent Labs  Lab 06/07/20 1825  WBC 4.0  HGB 12.5*  HCT 37.8*  MCV 93.8  PLT 54*   Cardiac Enzymes: No results for input(s): CKTOTAL, CKMB, CKMBINDEX, TROPONINI in the last 168 hours. BNP: Invalid input(s): POCBNP CBG: Recent Labs  Lab 06/07/20 1718 06/08/20 0146 06/08/20 0842 06/08/20 1238  GLUCAP 213* 145* 165* 206*   D-Dimer No results for input(s): DDIMER in the last 72 hours. Hgb A1c Recent Labs    06/07/20 1831  HGBA1C 7.5*   Lipid Profile No results for input(s): CHOL, HDL, LDLCALC, TRIG, CHOLHDL, LDLDIRECT in the last 72 hours. Thyroid function studies No results for input(s):  TSH, T4TOTAL, T3FREE, THYROIDAB in the last 72 hours.  Invalid input(s): FREET3 Anemia work up No results for input(s): VITAMINB12, FOLATE, FERRITIN, TIBC, IRON, RETICCTPCT in the last 72 hours. Urinalysis    Component Value Date/Time   COLORURINE YELLOW 06/07/2020 2150   APPEARANCEUR CLEAR 06/07/2020 2150   LABSPEC 1.012 06/07/2020 2150   PHURINE 5.0 06/07/2020 2150   GLUCOSEU NEGATIVE 06/07/2020 2150   HGBUR NEGATIVE 06/07/2020 2150   BILIRUBINUR NEGATIVE 06/07/2020 2150   [place] NEGATIVE 06/07/2020 2150   PROTEINUR NEGATIVE 06/07/2020 2150   NITRITE NEGATIVE 06/07/2020 2150   LEUKOCYTESUR NEGATIVE 06/07/2020 2150   Sepsis Labs Invalid input(s): PROCALCITONIN,  WBC,  LACTICIDVEN Microbiology Recent Results (from the past 240 hour(s))  SARS Coronavirus 2 by RT PCR (hospital order, performed in [place] hospital lab) Nasopharyngeal Nasopharyngeal Swab     Status: None   Collection Time: 06/07/20 11:19 PM   Specimen: Nasopharyngeal Swab  Result Value Ref Range Status   SARS Coronavirus 2 NEGATIVE NEGATIVE Final    Comment: (NOTE) SARS-CoV-2 target nucleic acids are NOT DETECTED.  The SARS-CoV-2 RNA is generally detectable in upper and lower respiratory specimens during the acute phase of infection. The lowest concentration of SARS-CoV-2 viral copies this assay can detect is 250 copies / mL. A negative result does not preclude SARS-CoV-2 infection and should not be used as the sole basis for treatment or other patient management decisions.  A negative result may occur with improper specimen collection / handling, submission of specimen other than nasopharyngeal swab, presence of viral mutation(s) within the areas targeted by this assay, and inadequate number of viral copies (<250 copies / mL). A negative result must be combined with clinical observations, patient history, and epidemiological information.  Fact Sheet for Patients:    StrictlyIdeas.no  Fact Sheet for Healthcare Providers: BankingDealers.co.za  This test is not yet approved or  cleared by the [place] FDA and has been authorized for detection and/or diagnosis of SARS-CoV-2 by FDA under an Emergency Use Authorization (EUA).  This EUA will remain in effect (meaning this test can be used) for the duration of the COVID-19 declaration under Section 564(b)(1) of the Act, 21 U.S.C. section 360bbb-3(b)(1), unless the authorization is terminated or revoked sooner.  Performed at [facility], [address] [place], [place] [address]      Time coordinating discharge: 72mns  SIGNED:   JKathie Dike MD  [facility] 06/08/2020, 9:12 PM   If 7PM-7AM, please contact night-coverage [email or web address]

## 2020-06-08 NOTE — ED Notes (Signed)
Pt ambulated to BR and back to room without difficultly, denies any dizziness.

## 2020-08-07 ENCOUNTER — Ambulatory Visit: Payer: BC Managed Care – PPO | Admitting: Gastroenterology

## 2020-09-10 ENCOUNTER — Other Ambulatory Visit: Payer: Self-pay

## 2020-09-10 ENCOUNTER — Ambulatory Visit: Payer: BC Managed Care – PPO | Admitting: Gastroenterology

## 2020-09-10 ENCOUNTER — Encounter: Payer: Self-pay | Admitting: Gastroenterology

## 2020-09-10 VITALS — BP 122/65 | HR 72 | Temp 97.5°F | Ht 67.0 in | Wt 232.8 lb

## 2020-09-10 DIAGNOSIS — K7460 Unspecified cirrhosis of liver: Secondary | ICD-10-CM | POA: Diagnosis not present

## 2020-09-10 DIAGNOSIS — K900 Celiac disease: Secondary | ICD-10-CM

## 2020-09-10 NOTE — Patient Instructions (Signed)
1. Please have labs done today. We will be in touch with results as available. 2. Continue gluten free diet.  3. Follow up with [facility] as scheduled.  4. You are due for colonoscopy whenever you are ready. As requested today, we will hold off on scheduling. Call when you are ready. 5. We will see you back in six months.

## 2020-09-10 NOTE — Progress Notes (Signed)
Primary Care Physician: Patient, No Pcp Per  Primary Gastroenterologist:  Garfield Cornea, MD   Chief Complaint  Patient presents with  . Cirrhosis    f/u.    HPI: Michael Harmon is a 59 y.o. male here for follow-up of Michael Harmon cirrhosis, celiac disease. Last seen in 02/2020.   Overall feels well. Only occasional diarrhea if accidental gluten exposure. No abdominal pain. Denies swelling or lower extremity edema. No heartburn.  Completed EGD August 2021 s/p esophageal variceal banding. he had portal gastropathy. Normal appearing duodenum in setting of celiac. Some odynophagia afterwards but this resolved.   Due TCS 11/2020 for h/o adenomatous colon polyps.  Last u/s in 08/2020 at [facility]: Cirrhotic liver morphology without evidence of [place].  Splenomegaly.  Small focus of ringdown artifact in the gallbladder wall may reflect adenomyomatosis.  Has appointment at [facility] liver next week.   Current Outpatient Medications  Medication Sig Dispense Refill  . acetaminophen (TYLENOL) 500 MG tablet Take 500 mg by mouth 2 (two) times daily as needed for moderate pain or headache.     Marland Kitchen atorvastatin (LIPITOR) 20 MG tablet Take 1 tablet (20 mg total) by mouth daily. 90 tablet 1  . benazepril-hydrochlorthiazide (LOTENSIN HCT) 10-12.5 MG tablet Take 1 tablet by mouth daily.    . Ca Carbonate-Mag Hydroxide (ROLAIDS PO) Take 1 tablet by mouth 4 (four) times daily as needed (heartburn).     . Cholecalciferol (VITAMIN D3) 50 MCG (2000 UT) TABS Take 2,000 Units by mouth daily.    . insulin lispro (HUMALOG) 100 UNIT/ML injection Inject 10 Units into the skin 3 (three) times daily before meals.    Marland Kitchen LEVEMIR FLEXTOUCH 100 UNIT/ML Pen Inject 36 Units into the skin at bedtime.     . Multiple Vitamin (MULTIVITAMIN WITH MINERALS) TABS tablet Take 1 tablet by mouth daily.     No current facility-administered medications for this visit.    Allergies as of 09/10/2020 - Review Complete 09/10/2020  Allergen  Reaction Noted  . Penicillins Other (See Comments) 04/07/2016  . Gluten meal Diarrhea 10/06/2017  . Biaxin [clarithromycin] Tinitus   . Prednisone Hives 11/21/2019    ROS:  General: Negative for anorexia, weight loss, fever, chills, fatigue, weakness. ENT: Negative for hoarseness, difficulty swallowing , nasal congestion. CV: Negative for chest pain, angina, palpitations, dyspnea on exertion, peripheral edema.  Respiratory: Negative for dyspnea at rest, dyspnea on exertion, cough, sputum, wheezing.  GI: See history of present illness. GU:  Negative for dysuria, hematuria, urinary incontinence, urinary frequency, nocturnal urination.  Endo: Negative for unusual weight change.    Physical Examination:   BP 122/65   Pulse 72   Temp (!) 97.5 F (36.4 C)   Ht 5' 7"  (1.702 m)   Wt 232 lb 12.8 oz (105.6 kg)   BMI 36.46 kg/m   General: Well-nourished, well-developed in no acute distress.  Eyes: No icterus. Mouth: masked. Lungs: Clear to auscultation bilaterally.  Heart: Regular rate and rhythm, no murmurs rubs or gallops.  Abdomen: Bowel sounds are normal, nontender, nondistended, no hepatosplenomegaly or masses, no abdominal bruits or hernia , no rebound or guarding.   Extremities: No lower extremity edema. No clubbing or deformities. Neuro: Alert and oriented x 4   Skin: Warm and dry, no jaundice.   Psych: Alert and cooperative, normal mood and affect.  Labs:  Lab Results  Component Value Date   HGBA1C 7.5 (H) 06/07/2020   Lab Results  Component Value Date  CREATININE 1.12 06/07/2020   BUN 20 06/07/2020   NA 135 06/07/2020   K 4.6 06/07/2020   CL 107 06/07/2020   CO2 19 (L) 06/07/2020   Lab Results  Component Value Date   ALT 45 (H) 06/07/2020   AST 51 (H) 06/07/2020   ALKPHOS 104 06/07/2020   BILITOT 1.1 06/07/2020   Lab Results  Component Value Date   INR 1.1 04/05/2020   INR 1.0 11/21/2019   INR 1.0 07/06/2019     Imaging Studies: No results  found.  Impression/Plan:  59 y/o male with history NASH cirrhosis/esophageal varices, Celiac disease here for follow up. Generally liver disease has been well compensated. Recommended continue to strive for tight glycemic control. A1C 7.5 3 months ago. He is due labs. Currently up to date on hepatoma screening. Has appointment next with with [place] hepatology. Will see him back here in six months.   Celiac disease is well controlled. Continue gluten free diet.   Patient due for surveillance colonoscopy but wants to postpone scheduling at this time. He will let us know when he is ready. Will see him back in six months.

## 2020-09-11 LAB — CBC WITH DIFFERENTIAL/PLATELET
Absolute Monocytes: 264 cells/uL (ref 200–950)
Basophils Absolute: 22 cells/uL (ref 0–200)
Basophils Relative: 0.7 %
Eosinophils Absolute: 192 cells/uL (ref 15–500)
Eosinophils Relative: 6.2 %
HCT: 36.4 % — ABNORMAL LOW (ref 38.5–50.0)
Hemoglobin: 12.5 g/dL — ABNORMAL LOW (ref 13.2–17.1)
Lymphs Abs: 961 cells/uL (ref 850–3900)
MCH: 32.2 pg (ref 27.0–33.0)
MCHC: 34.3 g/dL (ref 32.0–36.0)
MCV: 93.8 fL (ref 80.0–100.0)
MPV: 12 fL (ref 7.5–12.5)
Monocytes Relative: 8.5 %
Neutro Abs: 1662 cells/uL (ref 1500–7800)
Neutrophils Relative %: 53.6 %
Platelets: 58 10*3/uL — ABNORMAL LOW (ref 140–400)
RBC: 3.88 10*6/uL — ABNORMAL LOW (ref 4.20–5.80)
RDW: 13.2 % (ref 11.0–15.0)
Total Lymphocyte: 31 %
WBC: 3.1 10*3/uL — ABNORMAL LOW (ref 3.8–10.8)

## 2020-09-11 LAB — PROTIME-INR
INR: 1
Prothrombin Time: 10.9 s (ref 9.0–11.5)

## 2020-09-11 LAB — AFP TUMOR MARKER: AFP-Tumor Marker: 1.6 ng/mL (ref <6.1)

## 2020-09-13 ENCOUNTER — Encounter: Payer: Self-pay | Admitting: Gastroenterology

## 2020-09-16 ENCOUNTER — Other Ambulatory Visit: Payer: Self-pay

## 2020-09-16 DIAGNOSIS — K7460 Unspecified cirrhosis of liver: Secondary | ICD-10-CM

## 2020-09-18 ENCOUNTER — Telehealth: Payer: Self-pay

## 2020-09-18 NOTE — Telephone Encounter (Signed)
Please let pt know that we have the cmet on file. Going to be scanned.   09/10/20: bun 13, creatinine 0.96, alb 3.7, t bili 1.2, AP 123, AST 43H, ALT 42, A1C 7.7, sodium 136.  MELD Na 7

## 2020-09-18 NOTE — Telephone Encounter (Signed)
Pt called- he stated he came by the office yesterday and left a copy of CMP that he had done at his endocrinologist. He stated the lab tech told him that his insurance wouldn't pay for him to have it done twice, so they sent it to them since he had an office visit with them yesterday and he got them to make him a copy to bring to [place]. I looked on Leslie's desk and in the result bins, I did not see the labs. Pt is going to [employer] and wants to make sure that [facility] received the labs and he doesn't need to do them again. His phone number is [phone].

## 2020-09-19 NOTE — Telephone Encounter (Signed)
Spoke with pt. Pt is aware that CMET is on file and other lab results have been reviewed.

## 2021-01-24 ENCOUNTER — Encounter: Payer: Self-pay | Admitting: *Deleted

## 2021-03-10 ENCOUNTER — Ambulatory Visit: Payer: BC Managed Care – PPO | Admitting: Gastroenterology

## 2021-03-11 ENCOUNTER — Other Ambulatory Visit: Payer: Self-pay

## 2021-03-11 ENCOUNTER — Telehealth: Payer: Self-pay

## 2021-03-11 ENCOUNTER — Ambulatory Visit: Payer: BC Managed Care – PPO | Admitting: Gastroenterology

## 2021-03-11 ENCOUNTER — Encounter: Payer: Self-pay | Admitting: Gastroenterology

## 2021-03-11 VITALS — BP 120/66 | HR 71 | Temp 97.3°F | Ht 67.0 in | Wt 231.2 lb

## 2021-03-11 DIAGNOSIS — Z86010 Personal history of colonic polyps: Secondary | ICD-10-CM

## 2021-03-11 DIAGNOSIS — K900 Celiac disease: Secondary | ICD-10-CM | POA: Diagnosis not present

## 2021-03-11 DIAGNOSIS — I8510 Secondary esophageal varices without bleeding: Secondary | ICD-10-CM

## 2021-03-11 DIAGNOSIS — I8500 Esophageal varices without bleeding: Secondary | ICD-10-CM

## 2021-03-11 DIAGNOSIS — K7460 Unspecified cirrhosis of liver: Secondary | ICD-10-CM | POA: Diagnosis not present

## 2021-03-11 DIAGNOSIS — N509 Disorder of male genital organs, unspecified: Secondary | ICD-10-CM

## 2021-03-11 NOTE — Progress Notes (Signed)
No pcp per patient 

## 2021-03-11 NOTE — Patient Instructions (Addendum)
1. Have blood work done at [facility] as discussed. 2. Ultrasound of your liver as scheduled. 3. Upper endoscopy in the near future.  See separate instructions. 4. We will update colonoscopy at a later date per your request. 5. Urology referral for scrotal bleeding. 6. If you continue to have nosebleeds, you will need to be seen by ENT for consideration of cautery. 7. Reach out to [facility] at [phone].  Demetrius Revel, NP and Dr. Ihor Dow are excepting new patients. 8. In order to alternate appointments with [facility], we will see back in 1 year.  Call sooner if you have any questions or concerns.  Continue to have blood work and ultrasound every 6 months either with [place] or [facility]. 9. WHENEVER YOU DECIDE YOU ARE READY TO SCHEDULE YOUR COLONOSCOPY, PLEASE LET us KNOW.

## 2021-03-11 NOTE — Telephone Encounter (Signed)
Will call pt to schedule EGD/variceal banding w/Propofol w/Dr. [name] ASA 3 when his July/August schedule is available.

## 2021-03-11 NOTE — Progress Notes (Signed)
Primary Care Physician: Patient, No Pcp Per (Inactive)  Primary Gastroenterologist:  Garfield Cornea, MD   Chief Complaint  Patient presents with  . Cirrhosis    F/u.     HPI: Michael Harmon is a 60 y.o. male here for follow-up of Michael Harmon cirrhosis/esophageal varices, celiac disease, history of adenomatous colon polyps.  Last seen November 2021.  He follows with [facility] as well, last seen in November 2021.  Completed EGD August 2021 s/p esophageal variceal banding. He had portal gastropathy. Normal appearing duodenum in setting of celiac. Some odynophagia afterwards but this resolved. Has not been on nonselective beta blocker in the past due to low heart rate.   Due TCS 11/2020 for h/o adenomatous colon polyps.  Due for hepatoma screening and labs at this time.  Today: he is concerned about bleeding. He has woken up twice in the past 4-6 weeks with nosebleeds, profuse, watery/bloody. Took 45 minutes to get it to stop. He also has a history seeing urology several years back for "red lesions" on his scrotum. He cannot recall what his diagnosis was. Now he has spontaneous bleeding from the lesions. Has happened four times in the past one month. Has to wear gauze on scrotum when it happens. No scrotal pain or swelling. Has had this problem for 5-6 years but no bleeding before. Just started having gum bleeding. Worried about his platelet count. Appetite fair. No N/V. No heartburn. Will have diarrhea if he eats something cross contaminated with gluten. Last A1C 7.2 per patient. Currently sees his endocrinologist but has not been able to establish care with a PCP. Denies somnolence, confusion. No unintentional weight loss.     Current Outpatient Medications  Medication Sig Dispense Refill  . acetaminophen (TYLENOL) 500 MG tablet Take 500 mg by mouth 2 (two) times daily as needed for moderate pain or headache.     Marland Kitchen atorvastatin (LIPITOR) 20 MG tablet Take 1 tablet (20 mg total) by  mouth daily. 90 tablet 1  . benazepril-hydrochlorthiazide (LOTENSIN HCT) 10-12.5 MG tablet Take 1 tablet by mouth daily.    . Ca Carbonate-Mag Hydroxide (ROLAIDS PO) Take 1 tablet by mouth 4 (four) times daily as needed (heartburn).     . Cholecalciferol (VITAMIN D3) 50 MCG (2000 UT) TABS Take 3,000 Units by mouth daily.    . insulin lispro (HUMALOG) 100 UNIT/ML injection Inject 14-16 Units into the skin 3 (three) times daily before meals.    Marland Kitchen LEVEMIR FLEXTOUCH 100 UNIT/ML Pen Inject 36 Units into the skin at bedtime.     . Multiple Vitamin (MULTIVITAMIN WITH MINERALS) TABS tablet Take 1 tablet by mouth daily.     No current facility-administered medications for this visit.    Allergies as of 03/11/2021 - Review Complete 03/11/2021  Allergen Reaction Noted  . Penicillins Other (See Comments) 04/07/2016  . Gluten meal Diarrhea 10/06/2017  . Biaxin [clarithromycin] Tinitus   . Prednisone Hives 11/21/2019   Past Medical History:  Diagnosis Date  . Allergy    nose and sinus problems  . Anemia   . Asthma   . Celiac disease   . Cirrhosis, cryptogenic ([place])    completed Hep A and B vaccines  . DM (diabetes mellitus) ([place])   . GERD (gastroesophageal reflux disease)   . Hyperlipidemia    diet controlled now with 100 pound weight loss  . Hypertension   . Narrow angle glaucoma suspect of both eyes   . Renal cyst 09/03/2017  right  . Situational depression 07/06/2017  . Splenomegaly   . Thrombocytopenia ([place])    hematology, ?related to chol med (tricor) and glimeripide, just being monitored, above 100,000   Past Surgical History:  Procedure Laterality Date  . BIOPSY  07/09/2016   Procedure: BIOPSY;  Surgeon: [name], MD;  Location: [facility];  Service: Endoscopy;;  duodenal, gastric, esophaageal  . CLOSED MANIPULATION SHOULDER    . COLONOSCOPY  11/2015   Dr. Posey Pronto: cecal bx neg for microscopic colitis. ascending colon polyp (adenomatous)  . ESOPHAGEAL BANDING N/A 08/04/2017    Procedure: ESOPHAGEAL BANDING;  Surgeon: [name], MD;  Location: [facility];  Service: Endoscopy;  Laterality: N/A;  esophageal varices banding  . ESOPHAGEAL BANDING N/A 10/13/2017   Procedure: ESOPHAGEAL BANDING;  Surgeon: [name], MD;  [facility]: [facility];  Service: Endoscopy;  Laterality: N/A;  . ESOPHAGEAL BANDING N/A 04/09/2020   Procedure: ESOPHAGEAL BANDING;  Surgeon: [name], MD;  [facility]: [facility];  Service: Endoscopy;  Laterality: N/A;  . ESOPHAGOGASTRODUODENOSCOPY N/A 07/09/2016   Procedure: ESOPHAGOGASTRODUODENOSCOPY (EGD);  Surgeon: [name], MD;  [facility];  Service: Endoscopy;  Laterality: N/A;  730  . ESOPHAGOGASTRODUODENOSCOPY N/A 08/04/2017   Procedure: ESOPHAGOGASTRODUODENOSCOPY (EGD);  Surgeon: [name], MD;  [facility]: [facility];  Service: Endoscopy;  Laterality: N/A;  7:30am  . ESOPHAGOGASTRODUODENOSCOPY N/A 10/13/2017   Dr. [name]: grade 2 esophageal varices status post banding, portal hypertensive gastropathy  . ESOPHAGOGASTRODUODENOSCOPY  11/23/2019   [name]: Esophageal varices, 4 columns of grade 2-3 status post banding.  Varices more prominent than seen in December 2018.  Portal gastropathy.  Normal-appearing small bowel.  . ESOPHAGOGASTRODUODENOSCOPY (EGD) WITH PROPOFOL N/A 04/09/2020   Dr. [name]: 4 columns of grade 2/grade 3 esophageal varices somewhat recalcitrant.  Status post esophageal band ligation today.  Portal gastropathy.  Normal-appearing small bowel.   Family History  Problem Relation Age of Onset  . Other Mother        chronic diarrhea  . COPD Mother   . Heart disease Mother        died at 88  . Arthritis Mother   . Cancer Mother   . Depression Mother   . Diabetes Mother   . Hyperlipidemia Mother   . Hypertension Mother   . Stroke Mother   . Arthritis Father   . Asthma Father   . Birth defects Father   . Heart disease Father        aortic valve replaced  . Heart disease Maternal  Grandmother   . Stroke Maternal Grandmother   . Heart disease Maternal Grandfather   . Stroke Maternal Grandfather   . Diabetes Paternal Grandmother   . Stroke Paternal Grandfather   . Heart disease Paternal Grandfather   . Liver disease Neg Hx   . Colon cancer Neg Hx    Social History   Tobacco Use  . Smoking status: Never Smoker  . Smokeless tobacco: Never Used  Vaping Use  . Vaping Use: Never used  Substance Use Topics  . Alcohol use: Yes    Alcohol/week: 0.0 standard drinks    Comment: rare, 1-2 drinks a month, on vacation  . Drug use: No    ROS:  General: Negative for anorexia, weight loss, fever, chills, fatigue, weakness. ENT: Negative for hoarseness, difficulty swallowing , nasal congestion. CV: Negative for chest pain, angina, palpitations, dyspnea on exertion, peripheral edema.  Respiratory: Negative for dyspnea at  rest, dyspnea on exertion, cough, sputum, wheezing.  GI: See history of present illness. GU:  Negative for dysuria, hematuria, urinary incontinence, urinary frequency, nocturnal urination.  Endo: Negative for unusual weight change.    Physical Examination:   BP 120/66   Pulse 71   Temp (!) 97.3 F (36.3 C)   Ht 5' 7"  (1.702 m)   Wt 231 lb 3.2 oz (104.9 kg)   BMI 36.21 kg/m   General: Well-nourished, well-developed in no acute distress.  Eyes: No icterus. Mouth: masked Lungs: Clear to auscultation bilaterally.  Heart: Regular rate and rhythm, no murmurs rubs or gallops.  Abdomen: Bowel sounds are normal, nontender, nondistended, no hepatosplenomegaly or masses, no abdominal bruits or hernia , no rebound or guarding.   Scrotal exam: declined by patient Extremities: No lower extremity edema. No clubbing or deformities. Neuro: Alert and oriented x 4   Skin: Warm and dry, no jaundice.   Psych: Alert and cooperative, normal mood and affect.  Labs:  Lab Results  Component Value Date   CREATININE 1.12 06/07/2020   BUN 20 06/07/2020   NA 135  06/07/2020   K 4.6 06/07/2020   CL 107 06/07/2020   CO2 19 (L) 06/07/2020   Lab Results  Component Value Date   ALT 45 (H) 06/07/2020   AST 51 (H) 06/07/2020   ALKPHOS 104 06/07/2020   BILITOT 1.1 06/07/2020   Lab Results  Component Value Date   WBC 3.1 (L) 09/10/2020   HGB 12.5 (L) 09/10/2020   HCT 36.4 (L) 09/10/2020   MCV 93.8 09/10/2020   PLT 58 (L) 09/10/2020   Lab Results  Component Value Date   INR 1.0 09/10/2020   INR 1.1 04/05/2020   INR 1.0 11/21/2019     Imaging Studies: No results found.   Assessment:  Pleasant 60 year old male with history of Michael Harmon cirrhosis/esophageal varices, celiac disease, history of adenomatous colon polyps presenting for routine follow-up.   Cirrhosis: Due for hepatoma screening, labs at this time.  History of esophageal varices, multiple episodes of banding.  Patient previously felt to be intolerant to nonselective beta-blockers given low heart rate, never was placed on medication due to heart rate in the 50s.  He continues to follow with [facility].  He last saw them in November, plans for 1 year follow-up.  We will try to coordinate our follow-up visits between theirs.  Celiac disease: Continue gluten-free diet.  Overall well controlled unless he has some cross-contamination at which time he does have some diarrhea.  History of adenomatous colon polyps: Due for 5-year surveillance exam at this time.  Patient prefers to have colonoscopy and endoscopy done separately, wants to pursue endoscopy first.  Bleeding: Has noted a couple of episodes of nosebleeds, recent bleeding from the gums, bleeding from chronic scrotal issues/lesions.  States he used to see urology but has not been seen in over 3 years.  History of red lesions on the scrotum, he cannot recall the diagnosis, has noted spontaneous bleeding over the last month.  Requesting urology consultation, declined exam today.     Plan: 1. EGD with esophageal variceal banding with  propofol with Dr. [name]. ASA III.  I have discussed the risks, alternatives, benefits with regards to but not limited to the risk of reaction to medication, bleeding, infection, perforation and the patient is agreeable to proceed. Written consent to be obtained. 2. Will hold off on colonoscopy for now, patient will let me know when he is ready to schedule. 3.  He may need to see ENT if he has persistent nosebleeds.  He will let me know. 4. Urology referral 5. Update labs, hepatoma screening with ultrasound. 6. Continue gluten-free diet. 7. Since he will be seeing [facility] November, we will see him back for his routine follow-up in May 2023.  Of course we will see him sooner if needed. 8. Encourage patient to reach out to [facility] to see if he can establish care with PCP.

## 2021-03-19 ENCOUNTER — Ambulatory Visit (HOSPITAL_COMMUNITY)
Admission: RE | Admit: 2021-03-19 | Discharge: 2021-03-19 | Disposition: A | Discharge status: Home / Self Care | Payer: BC Managed Care – PPO | Source: Ambulatory Visit | Attending: Gastroenterology | Admitting: Gastroenterology

## 2021-03-19 ENCOUNTER — Other Ambulatory Visit: Payer: Self-pay

## 2021-03-19 DIAGNOSIS — K7460 Unspecified cirrhosis of liver: Secondary | ICD-10-CM | POA: Insufficient documentation

## 2021-03-19 DIAGNOSIS — Z86010 Personal history of colonic polyps: Secondary | ICD-10-CM | POA: Insufficient documentation

## 2021-03-19 DIAGNOSIS — K900 Celiac disease: Secondary | ICD-10-CM | POA: Diagnosis present

## 2021-03-19 DIAGNOSIS — I8500 Esophageal varices without bleeding: Secondary | ICD-10-CM | POA: Insufficient documentation

## 2021-03-20 LAB — COMPREHENSIVE METABOLIC PANEL
AG Ratio: 1.3 (calc) (ref 1.0–2.5)
ALT: 39 U/L (ref 9–46)
AST: 46 U/L — ABNORMAL HIGH (ref 10–35)
Albumin: 3.7 g/dL (ref 3.6–5.1)
Alkaline phosphatase (APISO): 121 U/L (ref 35–144)
BUN: 17 mg/dL (ref 7–25)
CO2: 20 mmol/L (ref 20–32)
Calcium: 8.6 mg/dL (ref 8.6–10.3)
Chloride: 108 mmol/L (ref 98–110)
Creat: 1.06 mg/dL (ref 0.70–1.25)
Globulin: 2.9 g/dL (calc) (ref 1.9–3.7)
Glucose, Bld: 126 mg/dL — ABNORMAL HIGH (ref 65–99)
Potassium: 4.7 mmol/L (ref 3.5–5.3)
Sodium: 136 mmol/L (ref 135–146)
Total Bilirubin: 1.1 mg/dL (ref 0.2–1.2)
Total Protein: 6.6 g/dL (ref 6.1–8.1)

## 2021-03-20 LAB — CBC WITH DIFFERENTIAL/PLATELET
Absolute Monocytes: 326 cells/uL (ref 200–950)
Basophils Absolute: 21 cells/uL (ref 0–200)
Basophils Relative: 0.6 %
Eosinophils Absolute: 315 cells/uL (ref 15–500)
Eosinophils Relative: 9 %
HCT: 34.4 % — ABNORMAL LOW (ref 38.5–50.0)
Hemoglobin: 11.6 g/dL — ABNORMAL LOW (ref 13.2–17.1)
Lymphs Abs: 1127 cells/uL (ref 850–3900)
MCH: 31.6 pg (ref 27.0–33.0)
MCHC: 33.7 g/dL (ref 32.0–36.0)
MCV: 93.7 fL (ref 80.0–100.0)
MPV: 12.1 fL (ref 7.5–12.5)
Monocytes Relative: 9.3 %
Neutro Abs: 1712 cells/uL (ref 1500–7800)
Neutrophils Relative %: 48.9 %
Platelets: 52 10*3/uL — ABNORMAL LOW (ref 140–400)
RBC: 3.67 10*6/uL — ABNORMAL LOW (ref 4.20–5.80)
RDW: 13.2 % (ref 11.0–15.0)
Total Lymphocyte: 32.2 %
WBC: 3.5 10*3/uL — ABNORMAL LOW (ref 3.8–10.8)

## 2021-03-20 LAB — AFP TUMOR MARKER: AFP-Tumor Marker: 1.7 ng/mL (ref <6.1)

## 2021-03-20 LAB — PROTIME-INR
INR: 1.1
Prothrombin Time: 11.2 s (ref 9.0–11.5)

## 2021-03-20 LAB — TISSUE TRANSGLUTAMINASE, IGA: (tTG) Ab, IgA: 21.4 U/mL — ABNORMAL HIGH

## 2021-03-28 ENCOUNTER — Other Ambulatory Visit: Payer: Self-pay | Admitting: *Deleted

## 2021-03-28 DIAGNOSIS — I8500 Esophageal varices without bleeding: Secondary | ICD-10-CM

## 2021-03-28 DIAGNOSIS — K7460 Unspecified cirrhosis of liver: Secondary | ICD-10-CM

## 2021-03-28 DIAGNOSIS — K900 Celiac disease: Secondary | ICD-10-CM

## 2021-04-07 ENCOUNTER — Telehealth: Payer: Self-pay | Admitting: *Deleted

## 2021-04-07 NOTE — Telephone Encounter (Signed)
Called pt, LMOVM to call back to schedule EGD with variceal banding, Dr. [name], propofol, ASA 3

## 2021-04-09 NOTE — Telephone Encounter (Signed)
Letter mailed

## 2021-04-14 ENCOUNTER — Telehealth: Payer: Self-pay | Admitting: Internal Medicine

## 2021-04-14 NOTE — Telephone Encounter (Signed)
Spoke to pt.  He wants to know if eating oats could have affected his lab work that he had done.  Pt aware that Neil Crouch, PA-C is off today and will return tomorrow.  He is ok with waiting to see her recommendations on this.

## 2021-04-14 NOTE — Telephone Encounter (Signed)
PLEASE CALL PATIENT ABOUT HIS LABS AND ALSO TO SCHEDULE HIS ENDO.  CALL HIS CELL PLEASE

## 2021-04-14 NOTE — Telephone Encounter (Signed)
Called pt and made aware Dr. [name] schedule as now booked up and will call when we receive his future schedule

## 2021-04-16 NOTE — Telephone Encounter (Signed)
Oats are technically gluten free however can be processed in plants containing gluten and could lead to cross contamination and gluten exposure.

## 2021-04-16 NOTE — Telephone Encounter (Signed)
Spoke to pt.  Informed him that oats are technically gluten free however can be processed in plants containing gluten and could lead to cross contamination and gluten exposure.  Pt voiced understanding.

## 2021-04-22 ENCOUNTER — Encounter: Payer: Self-pay | Admitting: *Deleted

## 2021-04-22 ENCOUNTER — Telehealth: Payer: Self-pay | Admitting: *Deleted

## 2021-04-22 NOTE — Telephone Encounter (Signed)
Called pt, EGD/variceal banding with propofol asa 3 Dr [name] scheduled for 8/18 at 7:30am. Aware will mail prep instructions with pre-op appt. Confirmed address.   PA submitted via [employer] for EGD.  Order ID: [id number]    Approval Valid Through: 04/22/2021 - 06/20/2021

## 2021-04-28 ENCOUNTER — Telehealth: Payer: Self-pay | Admitting: Internal Medicine

## 2021-04-28 NOTE — Telephone Encounter (Signed)
LMOVM to call back

## 2021-04-28 NOTE — Telephone Encounter (Signed)
Patient called and said he was due for a colonoscopy also and wanted to know if he can have both at the same time

## 2021-05-06 ENCOUNTER — Telehealth: Payer: Self-pay | Admitting: Internal Medicine

## 2021-05-06 NOTE — Telephone Encounter (Signed)
Will keep procedure as scheduled for now.

## 2021-05-06 NOTE — Telephone Encounter (Signed)
Pt is scheduled EGD with Dr [name] on 8/18. He is wanting to have a colonoscopy the same day. I told him that the schedule for that day is full and there's no room to add on the colonoscopy. I told him that if he wanted to have both done that it would have to be rescheduled to another day and it may go into September schedule. He said to leave it the way it was and he would speak to [facility] when she gets back and maybe the September schedule would be available then. [phone]

## 2021-05-08 ENCOUNTER — Other Ambulatory Visit: Payer: Self-pay | Admitting: *Deleted

## 2021-05-08 DIAGNOSIS — K900 Celiac disease: Secondary | ICD-10-CM

## 2021-05-08 DIAGNOSIS — K7460 Unspecified cirrhosis of liver: Secondary | ICD-10-CM

## 2021-05-08 DIAGNOSIS — I8500 Esophageal varices without bleeding: Secondary | ICD-10-CM

## 2021-05-19 ENCOUNTER — Other Ambulatory Visit: Payer: Self-pay

## 2021-05-19 ENCOUNTER — Encounter: Payer: Self-pay | Admitting: Urology

## 2021-05-19 ENCOUNTER — Ambulatory Visit: Payer: BC Managed Care – PPO | Admitting: Urology

## 2021-05-19 VITALS — BP 122/52 | HR 85

## 2021-05-19 DIAGNOSIS — N509 Disorder of male genital organs, unspecified: Secondary | ICD-10-CM

## 2021-05-19 DIAGNOSIS — R3915 Urgency of urination: Secondary | ICD-10-CM | POA: Diagnosis not present

## 2021-05-19 LAB — URINALYSIS, ROUTINE W REFLEX MICROSCOPIC
Bilirubin, UA: NEGATIVE
Glucose, UA: NEGATIVE
Ketones, UA: NEGATIVE
Leukocytes,UA: NEGATIVE
Nitrite, UA: NEGATIVE
Protein,UA: NEGATIVE
Specific Gravity, UA: 1.01 (ref 1.005–1.030)
Urobilinogen, Ur: 0.2 mg/dL (ref 0.2–1.0)
pH, UA: 5.5 (ref 5.0–7.5)

## 2021-05-19 LAB — MICROSCOPIC EXAMINATION
Bacteria, UA: NONE SEEN
Epithelial Cells (non renal): NONE SEEN /hpf (ref 0–10)
Renal Epithel, UA: NONE SEEN /hpf
WBC, UA: NONE SEEN /hpf (ref 0–5)

## 2021-05-19 LAB — BLADDER SCAN AMB NON-IMAGING: Scan Result: 86

## 2021-05-19 NOTE — Progress Notes (Signed)
05/19/2021 11:38 AM   Michael Harmon 02/05/1961 [id number]  Referring provider: [name], PA-C [address] [place],  [place] [address]  No chief complaint on file.   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 He has anemia.   2) BPH, urgency - AUASS =12. Took tamsulosin in the past. He has urgency and UUI. He recalls a prior PSA of "3-4" and took something to lower it. No constipation. No NG risk.  PVR 86 ml. UA clear.   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     PMH: Past Medical History:  Diagnosis Date   Allergy    nose and sinus problems   Anemia    Asthma    Celiac disease    Cirrhosis, cryptogenic (HCC)    completed Hep A and B vaccines   DM (diabetes mellitus) (HCC)    GERD (gastroesophageal reflux disease)    Hyperlipidemia    diet controlled now with 100 pound weight loss   Hypertension    Narrow angle glaucoma suspect of both eyes    Renal cyst 09/03/2017   right   Situational depression 07/06/2017   Splenomegaly    Thrombocytopenia ([place])    hematology, ?related to chol med (tricor) and glimeripide, just being monitored, above 100,000    Surgical History: Past Surgical History:  Procedure Laterality Date   BIOPSY  07/09/2016   Procedure: BIOPSY;  Surgeon: [name], MD;  [facility];  Service: Endoscopy;;  duodenal, gastric, esophaageal   CLOSED MANIPULATION SHOULDER     COLONOSCOPY  11/2015   Dr. Posey Pronto: cecal bx neg for microscopic colitis. ascending colon polyp (adenomatous)   ESOPHAGEAL BANDING N/A 08/04/2017   Procedure: ESOPHAGEAL BANDING;  Surgeon: [name], MD;  [facility];  Service: Endoscopy;  Laterality: N/A;  esophageal varices banding   ESOPHAGEAL BANDING N/A 10/13/2017   Procedure: ESOPHAGEAL BANDING;  Surgeon: [name], MD;   [facility];  Service: Endoscopy;  Laterality: N/A;   ESOPHAGEAL BANDING N/A 04/09/2020   Procedure: ESOPHAGEAL BANDING;  Surgeon: [name], MD;  [facility];  Service: Endoscopy;  Laterality: N/A;   ESOPHAGOGASTRODUODENOSCOPY N/A 07/09/2016   Procedure: ESOPHAGOGASTRODUODENOSCOPY (EGD);  Surgeon: [name], MD;  [facility];  Service: Endoscopy;  Laterality: N/A;  730   ESOPHAGOGASTRODUODENOSCOPY N/A 08/04/2017   Procedure: ESOPHAGOGASTRODUODENOSCOPY (EGD);  Surgeon: [name], MD;  [facility];  Service: Endoscopy;  Laterality: N/A;  7:30am   ESOPHAGOGASTRODUODENOSCOPY N/A 10/13/2017   Dr. [name]: grade 2 esophageal varices status post banding, portal hypertensive gastropathy   ESOPHAGOGASTRODUODENOSCOPY  11/23/2019   [name]: Esophageal varices, 4 columns of grade 2-3 status post banding.  Varices more prominent than seen in December 2018.  Portal gastropathy.  Normal-appearing small bowel.   ESOPHAGOGASTRODUODENOSCOPY (EGD) WITH PROPOFOL N/A 04/09/2020   Dr. [name]: 4 columns of grade 2/grade 3 esophageal varices somewhat recalcitrant.  Status post esophageal band ligation today.  Portal gastropathy.  Normal-appearing small bowel.    Home Medications:  Allergies as of 05/19/2021       Reactions   Penicillins Other (See Comments)   As a baby, unknown reaction Did it involve swelling of the face/tongue/throat, SOB, or low BP? Unknown Did it involve sudden or severe rash/hives, skin  peeling, or any reaction on the inside of your mouth or nose? Unknown Did you need to seek medical attention at a hospital or doctor's office? Unknown When did it last happen?      infant allergy If all above answers are "NO", may proceed with cephalosporin use. .   Gluten Meal Diarrhea   Biaxin [clarithromycin] Tinitus   Ears ringing, loss sense of smell   Prednisone Hives   Made blood sugar high        Medication List        Accurate as of  May 19, 2021 11:38 AM. If you have any questions, ask your nurse or doctor.          acetaminophen 500 MG tablet Commonly known as: TYLENOL Take 500 mg by mouth 2 (two) times daily as needed for moderate pain or headache.   atorvastatin 20 MG tablet Commonly known as: LIPITOR Take 1 tablet (20 mg total) by mouth daily.   benazepril-hydrochlorthiazide 10-12.5 MG tablet Commonly known as: LOTENSIN HCT Take 1 tablet by mouth daily.   insulin lispro 100 UNIT/ML injection Commonly known as: HUMALOG Inject 14-16 Units into the skin 3 (three) times daily before meals.   Levemir FlexTouch 100 UNIT/ML FlexPen Generic drug: insulin detemir Inject 36 Units into the skin at bedtime.   multivitamin with minerals Tabs tablet Take 1 tablet by mouth daily.   ROLAIDS PO Take 1 tablet by mouth 4 (four) times daily as needed (heartburn).   Vitamin D3 50 MCG (2000 UT) Tabs Take 3,000 Units by mouth daily.        Allergies:  Allergies  Allergen Reactions   Penicillins Other (See Comments)    As a baby, unknown reaction Did it involve swelling of the face/tongue/throat, SOB, or low BP? Unknown Did it involve sudden or severe rash/hives, skin peeling, or any reaction on the inside of your mouth or nose? Unknown Did you need to seek medical attention at a hospital or doctor's office? Unknown When did it last happen?      infant allergy If all above answers are "NO", may proceed with cephalosporin use. .    Gluten Meal Diarrhea   Biaxin [Clarithromycin] Tinitus    Ears ringing, loss sense of smell   Prednisone Hives    Made blood sugar high    Family History: Family History  Problem Relation Age of Onset   Other Mother        chronic diarrhea   COPD Mother    Heart disease Mother        died at 56   Arthritis Mother    Cancer Mother    Depression Mother    Diabetes Mother    Hyperlipidemia Mother    Hypertension Mother    Stroke Mother    Arthritis Father    Asthma  Father    Birth defects Father    Heart disease Father        aortic valve replaced   Heart disease Maternal Grandmother    Stroke Maternal Grandmother    Heart disease Maternal Grandfather    Stroke Maternal Grandfather    Diabetes Paternal Grandmother    Stroke Paternal Grandfather    Heart disease Paternal Grandfather    Liver disease Neg Hx    Colon cancer Neg Hx     Social History:  reports that he has never smoked. He has never used smokeless tobacco. He reports current alcohol use. He reports that he does not use  drugs.   Physical Exam: BP (!) 122/52   Pulse 85   Constitutional:  Alert and oriented, No acute distress. HEENT: [place] AT, moist mucus membranes.  Trachea midline, no masses. Cardiovascular: No clubbing, cyanosis, or edema. Respiratory: Normal respiratory effort, no increased work of breathing. GI: Abdomen is soft, nontender, nondistended, no abdominal masses, no inguinal hernia  GU: No CVA tenderness; GU: Penis circumcised, normal foreskin, testicles descended bilaterally and palpably normal, bilateral epididymis palpably normal, scrotum with R>L angiokeratoma  DRE: Prostate 30g, smooth without hard area or nodule Lymph: No cervical or inguinal lymphadenopathy. Skin: No rashes, bruises or suspicious lesions. Neurologic: Grossly intact, no focal deficits, moving all 4 extremities. Psychiatric: Normal mood and affect.  Laboratory Data: Lab Results  Component Value Date   WBC 3.5 (L) 03/19/2021   HGB 11.6 (L) 03/19/2021   HCT 34.4 (L) 03/19/2021   MCV 93.7 03/19/2021   PLT 52 (L) 03/19/2021    Lab Results  Component Value Date   CREATININE 1.06 03/19/2021    No results found for: PSA  No results found for: TESTOSTERONE  Lab Results  Component Value Date   HGBA1C 7.5 (H) 06/07/2020    Urinalysis    Component Value Date/Time   COLORURINE YELLOW 06/07/2020 2150   APPEARANCEUR CLEAR 06/07/2020 2150   LABSPEC 1.012 06/07/2020 2150   PHURINE 5.0  06/07/2020 2150   GLUCOSEU NEGATIVE 06/07/2020 2150   HGBUR NEGATIVE 06/07/2020 2150   [place] NEGATIVE 06/07/2020 2150   [place] NEGATIVE 06/07/2020 2150   PROTEINUR NEGATIVE 06/07/2020 2150   NITRITE NEGATIVE 06/07/2020 2150   LEUKOCYTESUR NEGATIVE 06/07/2020 2150    No results found for: LABMICR, [place], RBCUA, LABEPIT, MUCUS, BACTERIA  Pertinent Imaging: N/a No results found for this or any previous visit.  No results found for this or any previous visit.  No results found for this or any previous visit.  No results found for this or any previous visit.  No results found for this or any previous visit.  No results found for this or any previous visit.  No results found for this or any previous visit.  No results found for this or any previous visit.   Assessment & Plan:    1. Urgency of urination PSA was sent. Disc nature r/b of alpha blockers, OAB meds. He wants to hold of meds.  - BLADDER SCAN AMB NON-IMAGING - Urinalysis, Routine w reflex microscopic  2. Scrotal lesion Benign and not actively bleeding. Avoid scrotal trauma. Hold pressure if needed.    No follow-ups on file.  [name], MD  [facility]  [address] [place], [place] [address] [phone]

## 2021-05-19 NOTE — Progress Notes (Signed)
post void residual=86  Urological Symptom Review  Patient is experiencing the following symptoms: Frequent urination Hard to postpone urination Get up at night to urinate Leakage of urine Erection problems (male only)   Review of Systems  Gastrointestinal (upper)  : Negative for upper GI symptoms  Gastrointestinal (lower) : Diarrhea  Constitutional : Fatigue  Skin: Negative for skin symptoms  Eyes: Negative for eye symptoms  Ear/Nose/Throat : Sinus problems  Hematologic/Lymphatic: Easy bruising  Cardiovascular : Negative for cardiovascular symptoms  Respiratory : Negative for respiratory symptoms  Endocrine: Negative for endocrine symptoms  Musculoskeletal: Joint pain  Neurological: Negative for neurological symptoms  Psychologic: Negative for psychiatric symptoms

## 2021-05-20 LAB — PSA: Prostate Specific Ag, Serum: 7.4 ng/mL — ABNORMAL HIGH (ref 0.0–4.0)

## 2021-05-27 ENCOUNTER — Telehealth: Payer: Self-pay

## 2021-05-27 DIAGNOSIS — R972 Elevated prostate specific antigen [PSA]: Secondary | ICD-10-CM

## 2021-05-27 NOTE — Telephone Encounter (Signed)
Called and notified patient of recent PSA level. 1 month lab repeat appt made with patient.

## 2021-05-27 NOTE — Telephone Encounter (Signed)
-----   Message from [name], MD sent at 05/20/2021  9:48 AM EDT ----- Please let patient know his PSA came back very high - gove result. This is a concern for prostate cancer. I would recommend we recheck the PSA in one month to confirm and if it remains elevated we will need to set up a prostate biopsy. If he would like to discuss with me - recheck PSA in 4-6 weeks and see me for OV to discuss. Thank you.   ----- Message ----- From: Dorisann Frames, RN Sent: 05/20/2021   7:52 AM EDT To: [name], MD  Please review

## 2021-05-30 NOTE — Telephone Encounter (Signed)
Dr. Roseanne Kaufman September schedule is available. Tried to call pt to see if he wants to do TCS/EGD/Esop banding together in September, LMOVM for return call.

## 2021-06-03 ENCOUNTER — Telehealth: Payer: Self-pay | Admitting: Internal Medicine

## 2021-06-03 MED ORDER — PEG 3350-KCL-NA BICARB-NACL 420 G PO SOLR
ORAL | 0 refills | Status: DC
Start: 2021-06-03 — End: 2021-08-18

## 2021-06-03 NOTE — Addendum Note (Signed)
Addended by: Cheron Every on: 06/03/2021 04:40 PM   Modules accepted: Orders

## 2021-06-03 NOTE — Telephone Encounter (Signed)
LMOVM to call back

## 2021-06-03 NOTE — Telephone Encounter (Signed)
See prior note

## 2021-06-03 NOTE — Telephone Encounter (Signed)
Spoke with patient. He is scheduled for EGD/ESOPH Banding 8/18. If he needs a TCS done this year then he wants it done all together or if he isn't due until next year then he fine with having just TCS done next year. Magda Paganini, please advise thanks

## 2021-06-03 NOTE — Telephone Encounter (Signed)
Pt said he was returning Martina's call from Friday about getting his procedure scheduled. He asked for [place] to call his cell [phone]

## 2021-06-03 NOTE — Telephone Encounter (Signed)
He is due colonoscopy at any time (five year f/u for colon polyps would have been 11/2020).   Ok to add colonoscopy to his egd if he wants.  He will need to take 1/2 dose levemir night before procedure (18 units at bedtime). Humalog 1/2 dose before meals the day before. Hold both morning of colonoscopy/egd.

## 2021-06-03 NOTE — Telephone Encounter (Signed)
Patient returned call. He has been scheduled for TCS/EGD/ESOPH banding ASA 3 on 9/29 at 7:30am. Aware will mail new instructions and will send in Rx for prep to [facility] in [place]. He voiced understanding

## 2021-06-04 ENCOUNTER — Encounter: Payer: Self-pay | Admitting: *Deleted

## 2021-06-04 NOTE — Telephone Encounter (Signed)
PA approved via [employer]. TCS auth# [id number] DOS 06/04/2021-08/02/2021  EGD auth# [id number] DOS 06/04/2021-08/02/2021

## 2021-06-16 ENCOUNTER — Encounter (HOSPITAL_COMMUNITY): Payer: BC Managed Care – PPO

## 2021-06-19 ENCOUNTER — Ambulatory Visit (HOSPITAL_COMMUNITY): Admit: 2021-06-19 | Payer: BC Managed Care – PPO | Admitting: Internal Medicine

## 2021-06-19 ENCOUNTER — Encounter (HOSPITAL_COMMUNITY): Payer: Self-pay

## 2021-06-19 SURGERY — ESOPHAGOGASTRODUODENOSCOPY (EGD) WITH PROPOFOL
Anesthesia: Monitor Anesthesia Care

## 2021-06-23 ENCOUNTER — Other Ambulatory Visit: Payer: Self-pay

## 2021-06-23 ENCOUNTER — Other Ambulatory Visit: Payer: BC Managed Care – PPO

## 2021-06-24 LAB — CBC WITH DIFFERENTIAL/PLATELET
Absolute Monocytes: 241 cells/uL (ref 200–950)
Basophils Absolute: 20 cells/uL (ref 0–200)
Basophils Relative: 0.7 %
Eosinophils Absolute: 229 cells/uL (ref 15–500)
Eosinophils Relative: 7.9 %
HCT: 34.6 % — ABNORMAL LOW (ref 38.5–50.0)
Hemoglobin: 11.7 g/dL — ABNORMAL LOW (ref 13.2–17.1)
Lymphs Abs: 719 cells/uL — ABNORMAL LOW (ref 850–3900)
MCH: 32 pg (ref 27.0–33.0)
MCHC: 33.8 g/dL (ref 32.0–36.0)
MCV: 94.5 fL (ref 80.0–100.0)
MPV: 12.7 fL — ABNORMAL HIGH (ref 7.5–12.5)
Monocytes Relative: 8.3 %
Neutro Abs: 1691 cells/uL (ref 1500–7800)
Neutrophils Relative %: 58.3 %
Platelets: 48 10*3/uL — ABNORMAL LOW (ref 140–400)
RBC: 3.66 10*6/uL — ABNORMAL LOW (ref 4.20–5.80)
RDW: 13.1 % (ref 11.0–15.0)
Total Lymphocyte: 24.8 %
WBC: 2.9 10*3/uL — ABNORMAL LOW (ref 3.8–10.8)

## 2021-06-24 LAB — PSA: Prostate Specific Ag, Serum: 8.2 ng/mL — ABNORMAL HIGH (ref 0.0–4.0)

## 2021-06-25 ENCOUNTER — Telehealth: Payer: Self-pay

## 2021-06-25 DIAGNOSIS — R972 Elevated prostate specific antigen [PSA]: Secondary | ICD-10-CM

## 2021-06-25 MED ORDER — LEVOFLOXACIN 750 MG PO TABS: 750.0000 mg | ORAL_TABLET | Freq: Once | ORAL | 0 refills | Status: AC

## 2021-06-25 NOTE — Telephone Encounter (Signed)
Called and discussed prostate biopsy and psa results with patient. Scheduled for 09/19.  Antibiotic sent to pharmacy.   Instructions sent via my chart/mailed.  2 week f/u scheduled.  Patient voiced understanding.

## 2021-06-25 NOTE — Telephone Encounter (Signed)
-----   Message from [name], MD sent at 06/24/2021  1:42 PM EDT ----- Please let patient know his PSA remains elevated and I would recommend we proceed with a prostate biopsy. Please schedule and go over instructions and schedule a f/u about 2 weeks after the biopsy to discuss results. Thank you .  ----- Message ----- From: Dorisann Frames, RN Sent: 06/24/2021   9:23 AM EDT To: [name], MD  Please review

## 2021-07-01 ENCOUNTER — Telehealth: Payer: Self-pay | Admitting: Internal Medicine

## 2021-07-01 DIAGNOSIS — Z862 Personal history of diseases of the blood and blood-forming organs and certain disorders involving the immune mechanism: Secondary | ICD-10-CM

## 2021-07-01 NOTE — Telephone Encounter (Signed)
Pt was made aware and verbalized understanding.

## 2021-07-01 NOTE — Telephone Encounter (Signed)
Pt called asking for Dr [name] or Neil Crouch, PA to call him. He is considered about his platelet count being low. [phone]

## 2021-07-01 NOTE — Telephone Encounter (Signed)
Pt called stating that he use to see a hematologist and was diagnosed with ITP. Pt states that he was told by the hematologist that if his platelet count got below 50 that something needed to be done. Pt states that his last platelet count was 48. Pt is wondering if he needs to go back to seeing a hematologist. Pt also stated that this past weekend he had a bad GI episode with severe abd pain and diarrhea. Pt was able to relieve the pain and control the diarrhea with pepto bismol and imodium. Pt is not having any nose bleeds or rectal bleeding, and is no longer having the abd pain or diarrhea.

## 2021-07-01 NOTE — Telephone Encounter (Signed)
We can get him back into seeing a hematologist to be thorough but low platelets are likely due to his cirrhosis. With his h/o ITP, if he wants we can refer to hematologist.   Noted regarding abdominal pain, diarrhea. He should call if any recurrent issues.

## 2021-07-02 NOTE — Addendum Note (Signed)
Addended by: Hassan Rowan on: 07/02/2021 07:58 AM   Modules accepted: Orders

## 2021-07-02 NOTE — Telephone Encounter (Signed)
Referral sent to hematology via Epic.

## 2021-07-15 ENCOUNTER — Telehealth: Payer: Self-pay

## 2021-07-15 NOTE — Telephone Encounter (Signed)
Patient made aware of antibiotic to pick up at pharmacy.

## 2021-07-15 NOTE — Telephone Encounter (Signed)
Patient calling about medication that is supposed to be taken the day of his procedure.  He says it's not at his pharmacy.  Please advise.  Call back: [phone]   Thanks, Helene Kelp

## 2021-07-21 ENCOUNTER — Telehealth: Payer: Self-pay

## 2021-07-21 ENCOUNTER — Ambulatory Visit (HOSPITAL_COMMUNITY): Admission: RE | Admit: 2021-07-21 | Payer: BC Managed Care – PPO | Source: Ambulatory Visit

## 2021-07-21 ENCOUNTER — Other Ambulatory Visit: Payer: BC Managed Care – PPO | Admitting: Urology

## 2021-07-21 NOTE — Telephone Encounter (Signed)
-----   Message from [name], MD sent at 07/20/2021  2:11 PM EDT ----- We need to reschedule Mr. Michael Harmon prostate biopsy to the middle or end of October. He has severe thrombocytopenia / low platelets and sees hematology later this month. We need to get hematology clearance prior to his biopsy to optimize his platelets.

## 2021-07-21 NOTE — Telephone Encounter (Signed)
Patient called and notified of biopsy to be postponed. Patient voiced understanding. New date for October 17th reviewed with patient and agreed. Biopsy instructions reviewed again with patient and still currently has levaquin rx to take. Message sent to radiology schedule to change.

## 2021-07-24 NOTE — Patient Instructions (Signed)
Michael Harmon  07/24/2021     @PREFPERIOPPHARMACY @   Your procedure is scheduled on 07/31/2021.   Report to [facility] at  [phone] A.M.   Call this number if you have problems the morning of surgery:  [phone]   Remember:  Follow the diet and prep instructions given to you by the office.     Take 18 units of levemir the night before your procedure.     DO NOT take any medications for diabetes the morning of your procedure.    Take these medicines the morning of surgery with A SIP OF WATER                                      None     Do not wear jewelry, make-up or nail polish.  Do not wear lotions, powders, or perfumes, or deodorant.  Do not shave 48 hours prior to surgery.  Men may shave face and neck.  Do not bring valuables to the hospital.  [facility] is not responsible for any belongings or valuables.  Contacts, dentures or bridgework may not be worn into surgery.  Leave your suitcase in the car.  After surgery it may be brought to your room.  For patients admitted to the hospital, discharge time will be determined by your treatment team.  Patients discharged the day of surgery will not be allowed to drive home and must have someone with them for 24 hours.    Special instructions:  DO NOT smoke tobacco or vape for 24 hours before your procedure.  Please read over the following fact sheets that you were given. Anesthesia Post-op Instructions and Care and Recovery After Surgery      Upper Endoscopy, Adult, Care After This sheet gives you information about how to care for yourself after your procedure. Your health care provider may also give you more specific instructions. If you have problems or questions, contact your health care provider. What can I expect after the procedure? After the procedure, it is common to have: A sore throat. Mild stomach pain or discomfort. Bloating. Nausea. Follow these instructions at home:  Follow instructions  from your health care provider about what to eat or drink after your procedure. Return to your normal activities as told by your health care provider. Ask your health care provider what activities are safe for you. Take over-the-counter and prescription medicines only as told by your health care provider. If you were given a sedative during the procedure, it can affect you for several hours. Do not drive or operate machinery until your health care provider says that it is safe. Keep all follow-up visits as told by your health care provider. This is important. Contact a health care provider if you have: A sore throat that lasts longer than one day. Trouble swallowing. Get help right away if: You vomit blood or your vomit looks like coffee grounds. You have: A fever. Bloody, black, or tarry stools. A severe sore throat or you cannot swallow. Difficulty breathing. Severe pain in your chest or abdomen. Summary After the procedure, it is common to have a sore throat, mild stomach discomfort, bloating, and nausea. If you were given a sedative during the procedure, it can affect you for several hours. Do not drive or operate machinery until your health care provider says that it is safe. Follow  instructions from your health care provider about what to eat or drink after your procedure. Return to your normal activities as told by your health care provider. This information is not intended to replace advice given to you by your health care provider. Make sure you discuss any questions you have with your health care provider. Document Revised: 10/17/2019 Document Reviewed: 03/21/2018 Elsevier Patient Education  2022 [employer]. Colonoscopy, Adult, Care After This sheet gives you information about how to care for yourself after your procedure. Your health care provider may also give you more specific instructions. If you have problems or questions, contact your health care provider. What can I  expect after the procedure? After the procedure, it is common to have: A small amount of blood in your stool for 24 hours after the procedure. Some gas. Mild cramping or bloating of your abdomen. Follow these instructions at home: Eating and drinking  Drink enough fluid to keep your urine pale yellow. Follow instructions from your health care provider about eating or drinking restrictions. Resume your normal diet as instructed by your health care provider. Avoid heavy or fried foods that are hard to digest. Activity Rest as told by your health care provider. Avoid sitting for a long time without moving. Get up to take short walks every 1-2 hours. This is important to improve blood flow and breathing. Ask for help if you feel weak or unsteady. Return to your normal activities as told by your health care provider. Ask your health care provider what activities are safe for you. Managing cramping and bloating  Try walking around when you have cramps or feel bloated. Apply heat to your abdomen as told by your health care provider. Use the heat source that your health care provider recommends, such as a moist heat pack or a heating pad. Place a towel between your skin and the heat source. Leave the heat on for 20-30 minutes. Remove the heat if your skin turns bright red. This is especially important if you are unable to feel pain, heat, or cold. You may have a greater risk of getting burned. General instructions If you were given a sedative during the procedure, it can affect you for several hours. Do not drive or operate machinery until your health care provider says that it is safe. For the first 24 hours after the procedure: Do not sign important documents. Do not drink alcohol. Do your regular daily activities at a slower pace than normal. Eat soft foods that are easy to digest. Take over-the-counter and prescription medicines only as told by your health care provider. Keep all follow-up  visits as told by your health care provider. This is important. Contact a health care provider if: You have blood in your stool 2-3 days after the procedure. Get help right away if you have: More than a small spotting of blood in your stool. Large blood clots in your stool. Swelling of your abdomen. Nausea or vomiting. A fever. Increasing pain in your abdomen that is not relieved with medicine. Summary After the procedure, it is common to have a small amount of blood in your stool. You may also have mild cramping and bloating of your abdomen. If you were given a sedative during the procedure, it can affect you for several hours. Do not drive or operate machinery until your health care provider says that it is safe. Get help right away if you have a lot of blood in your stool, nausea or vomiting, a fever, or  increased pain in your abdomen. This information is not intended to replace advice given to you by your health care provider. Make sure you discuss any questions you have with your health care provider. Document Revised: 10/13/2019 Document Reviewed: 05/15/2019 Elsevier Patient Education  Natural Steps After This sheet gives you information about how to care for yourself after your procedure. Your health care provider may also give you more specific instructions. If you have problems or questions, contact your health care provider. What can I expect after the procedure? After the procedure, it is common to have: Tiredness. Forgetfulness about what happened after the procedure. Impaired judgment for important decisions. Nausea or vomiting. Some difficulty with balance. Follow these instructions at home: For the time period you were told by your health care provider:   Rest as needed. Do not participate in activities where you could fall or become injured. Do not drive or use machinery. Do not drink alcohol. Do not take sleeping pills or  medicines that cause drowsiness. Do not make important decisions or sign legal documents. Do not take care of children on your own. Eating and drinking Follow the diet that is recommended by your health care provider. Drink enough fluid to keep your urine pale yellow. If you vomit: Drink water, juice, or soup when you can drink without vomiting. Make sure you have little or no nausea before eating solid foods. General instructions Have a responsible adult stay with you for the time you are told. It is important to have someone help care for you until you are awake and alert. Take over-the-counter and prescription medicines only as told by your health care provider. If you have sleep apnea, surgery and certain medicines can increase your risk for breathing problems. Follow instructions from your health care provider about wearing your sleep device: Anytime you are sleeping, including during daytime naps. While taking prescription pain medicines, sleeping medicines, or medicines that make you drowsy. Avoid smoking. Keep all follow-up visits as told by your health care provider. This is important. Contact a health care provider if: You keep feeling nauseous or you keep vomiting. You feel light-headed. You are still sleepy or having trouble with balance after 24 hours. You develop a rash. You have a fever. You have redness or swelling around the IV site. Get help right away if: You have trouble breathing. You have new-onset confusion at home. Summary For several hours after your procedure, you may feel tired. You may also be forgetful and have poor judgment. Have a responsible adult stay with you for the time you are told. It is important to have someone help care for you until you are awake and alert. Rest as told. Do not drive or operate machinery. Do not drink alcohol or take sleeping pills. Get help right away if you have trouble breathing, or if you suddenly become confused. This  information is not intended to replace advice given to you by your health care provider. Make sure you discuss any questions you have with your health care provider. Document Revised: 07/04/2020 Document Reviewed: 09/21/2019 Elsevier Patient Education  2022 [employer].

## 2021-07-25 ENCOUNTER — Ambulatory Visit (HOSPITAL_COMMUNITY): Payer: BC Managed Care – PPO | Admitting: Hematology and Oncology

## 2021-07-29 ENCOUNTER — Encounter (HOSPITAL_COMMUNITY)
Admission: RE | Admit: 2021-07-29 | Discharge: 2021-07-29 | Disposition: A | Discharge status: Home / Self Care | Payer: BC Managed Care – PPO | Source: Ambulatory Visit | Attending: Internal Medicine | Admitting: Internal Medicine

## 2021-07-29 ENCOUNTER — Other Ambulatory Visit: Payer: Self-pay

## 2021-07-29 ENCOUNTER — Encounter (HOSPITAL_COMMUNITY): Payer: Self-pay

## 2021-07-29 DIAGNOSIS — Z79899 Other long term (current) drug therapy: Secondary | ICD-10-CM | POA: Diagnosis not present

## 2021-07-29 DIAGNOSIS — Z01818 Encounter for other preprocedural examination: Secondary | ICD-10-CM | POA: Insufficient documentation

## 2021-07-29 DIAGNOSIS — K900 Celiac disease: Secondary | ICD-10-CM | POA: Diagnosis not present

## 2021-07-29 DIAGNOSIS — D693 Immune thrombocytopenic purpura: Secondary | ICD-10-CM | POA: Diagnosis not present

## 2021-07-29 DIAGNOSIS — K319 Disease of stomach and duodenum, unspecified: Secondary | ICD-10-CM | POA: Diagnosis not present

## 2021-07-29 DIAGNOSIS — Z880 Allergy status to penicillin: Secondary | ICD-10-CM | POA: Diagnosis not present

## 2021-07-29 DIAGNOSIS — K7460 Unspecified cirrhosis of liver: Secondary | ICD-10-CM | POA: Insufficient documentation

## 2021-07-29 DIAGNOSIS — Z86010 Personal history of colonic polyps: Secondary | ICD-10-CM | POA: Diagnosis not present

## 2021-07-29 DIAGNOSIS — Z1211 Encounter for screening for malignant neoplasm of colon: Secondary | ICD-10-CM | POA: Diagnosis not present

## 2021-07-29 DIAGNOSIS — Z794 Long term (current) use of insulin: Secondary | ICD-10-CM | POA: Diagnosis not present

## 2021-07-29 DIAGNOSIS — Z881 Allergy status to other antibiotic agents status: Secondary | ICD-10-CM | POA: Diagnosis not present

## 2021-07-29 DIAGNOSIS — I8510 Secondary esophageal varices without bleeding: Secondary | ICD-10-CM | POA: Diagnosis not present

## 2021-07-29 DIAGNOSIS — K640 First degree hemorrhoids: Secondary | ICD-10-CM | POA: Diagnosis not present

## 2021-07-29 DIAGNOSIS — Z888 Allergy status to other drugs, medicaments and biological substances status: Secondary | ICD-10-CM | POA: Diagnosis not present

## 2021-07-29 LAB — COMPREHENSIVE METABOLIC PANEL
ALT: 40 U/L (ref 0–44)
AST: 54 U/L — ABNORMAL HIGH (ref 15–41)
Albumin: 3.3 g/dL — ABNORMAL LOW (ref 3.5–5.0)
Alkaline Phosphatase: 126 U/L (ref 38–126)
Anion gap: 5 (ref 5–15)
BUN: 14 mg/dL (ref 8–23)
CO2: 21 mmol/L — ABNORMAL LOW (ref 22–32)
Calcium: 8.7 mg/dL — ABNORMAL LOW (ref 8.9–10.3)
Chloride: 110 mmol/L (ref 98–111)
Creatinine, Ser: 1.07 mg/dL (ref 0.61–1.24)
GFR, Estimated: >60 mL/min (ref >60)
Glucose, Bld: 134 mg/dL — ABNORMAL HIGH (ref 70–99)
Potassium: 5 mmol/L (ref 3.5–5.1)
Sodium: 136 mmol/L (ref 135–145)
Total Bilirubin: 0.8 mg/dL (ref 0.3–1.2)
Total Protein: 6.7 g/dL (ref 6.5–8.1)

## 2021-07-29 LAB — CBC WITH DIFFERENTIAL/PLATELET
Abs Immature Granulocytes: 0.01 10*3/uL (ref 0.00–0.07)
Basophils Absolute: 0 10*3/uL (ref 0.0–0.1)
Basophils Relative: 0 %
Eosinophils Absolute: 0.2 10*3/uL (ref 0.0–0.5)
Eosinophils Relative: 8 %
HCT: 33.1 % — ABNORMAL LOW (ref 39.0–52.0)
Hemoglobin: 11 g/dL — ABNORMAL LOW (ref 13.0–17.0)
Immature Granulocytes: 0 %
Lymphocytes Relative: 30 %
Lymphs Abs: 0.9 10*3/uL (ref 0.7–4.0)
MCH: 32.2 pg (ref 26.0–34.0)
MCHC: 33.2 g/dL (ref 30.0–36.0)
MCV: 96.8 fL (ref 80.0–100.0)
Monocytes Absolute: 0.4 10*3/uL (ref 0.1–1.0)
Monocytes Relative: 12 %
Neutro Abs: 1.5 10*3/uL — ABNORMAL LOW (ref 1.7–7.7)
Neutrophils Relative %: 50 %
Platelets: 43 10*3/uL — ABNORMAL LOW (ref 150–400)
RBC: 3.42 MIL/uL — ABNORMAL LOW (ref 4.22–5.81)
RDW: 14.6 % (ref 11.5–15.5)
WBC: 3 10*3/uL — ABNORMAL LOW (ref 4.0–10.5)
nRBC: 0 % (ref 0.0–0.2)

## 2021-07-29 LAB — PROTIME-INR
INR: 1.2 (ref 0.8–1.2)
Prothrombin Time: 15.1 seconds (ref 11.4–15.2)

## 2021-07-31 ENCOUNTER — Ambulatory Visit (HOSPITAL_COMMUNITY): Payer: BC Managed Care – PPO | Admitting: Anesthesiology

## 2021-07-31 ENCOUNTER — Encounter (HOSPITAL_COMMUNITY): Payer: Self-pay | Admitting: Internal Medicine

## 2021-07-31 ENCOUNTER — Other Ambulatory Visit: Payer: Self-pay

## 2021-07-31 ENCOUNTER — Ambulatory Visit (HOSPITAL_COMMUNITY)
Admission: RE | Admit: 2021-07-31 | Discharge: 2021-07-31 | Disposition: A | Discharge status: Home / Self Care | Payer: BC Managed Care – PPO | Attending: Internal Medicine | Admitting: Internal Medicine

## 2021-07-31 ENCOUNTER — Encounter (HOSPITAL_COMMUNITY)
Admission: RE | Disposition: A | Discharge status: Home / Self Care | Payer: Self-pay | Source: Home / Self Care | Attending: Internal Medicine

## 2021-07-31 DIAGNOSIS — Z881 Allergy status to other antibiotic agents status: Secondary | ICD-10-CM | POA: Insufficient documentation

## 2021-07-31 DIAGNOSIS — K319 Disease of stomach and duodenum, unspecified: Secondary | ICD-10-CM | POA: Insufficient documentation

## 2021-07-31 DIAGNOSIS — Z86010 Personal history of colonic polyps: Secondary | ICD-10-CM | POA: Insufficient documentation

## 2021-07-31 DIAGNOSIS — K900 Celiac disease: Secondary | ICD-10-CM | POA: Insufficient documentation

## 2021-07-31 DIAGNOSIS — Z79899 Other long term (current) drug therapy: Secondary | ICD-10-CM | POA: Insufficient documentation

## 2021-07-31 DIAGNOSIS — I8510 Secondary esophageal varices without bleeding: Secondary | ICD-10-CM | POA: Diagnosis not present

## 2021-07-31 DIAGNOSIS — K7460 Unspecified cirrhosis of liver: Secondary | ICD-10-CM | POA: Diagnosis not present

## 2021-07-31 DIAGNOSIS — Z794 Long term (current) use of insulin: Secondary | ICD-10-CM | POA: Insufficient documentation

## 2021-07-31 DIAGNOSIS — Z1211 Encounter for screening for malignant neoplasm of colon: Secondary | ICD-10-CM | POA: Insufficient documentation

## 2021-07-31 DIAGNOSIS — K640 First degree hemorrhoids: Secondary | ICD-10-CM | POA: Insufficient documentation

## 2021-07-31 DIAGNOSIS — D693 Immune thrombocytopenic purpura: Secondary | ICD-10-CM | POA: Insufficient documentation

## 2021-07-31 DIAGNOSIS — Z888 Allergy status to other drugs, medicaments and biological substances status: Secondary | ICD-10-CM | POA: Insufficient documentation

## 2021-07-31 DIAGNOSIS — Z880 Allergy status to penicillin: Secondary | ICD-10-CM | POA: Insufficient documentation

## 2021-07-31 HISTORY — PX: COLONOSCOPY WITH PROPOFOL: SHX5780

## 2021-07-31 HISTORY — PX: BIOPSY: SHX5522

## 2021-07-31 HISTORY — PX: ESOPHAGOGASTRODUODENOSCOPY (EGD) WITH PROPOFOL: SHX5813

## 2021-07-31 LAB — GLUCOSE, CAPILLARY
Glucose-Capillary: 92 mg/dL (ref 70–99)
Glucose-Capillary: 99 mg/dL (ref 70–99)

## 2021-07-31 SURGERY — COLONOSCOPY WITH PROPOFOL
Anesthesia: General

## 2021-07-31 MED ORDER — LACTATED RINGERS IV SOLN: INTRAVENOUS | Status: DC

## 2021-07-31 MED ORDER — PHENYLEPHRINE HCL (PRESSORS) 10 MG/ML IV SOLN
INTRAVENOUS | Status: DC | PRN
  Administered 2021-07-31 (×2): 80 ug via INTRAVENOUS
  Administered 2021-07-31: 40 ug via INTRAVENOUS

## 2021-07-31 MED ORDER — PROPOFOL 500 MG/50ML IV EMUL
INTRAVENOUS | Status: DC | PRN
  Administered 2021-07-31 (×2): 50 ug/kg/min via INTRAVENOUS

## 2021-07-31 MED ORDER — LIDOCAINE HCL (CARDIAC) PF 50 MG/5ML IV SOSY
PREFILLED_SYRINGE | INTRAVENOUS | Status: DC | PRN
  Administered 2021-07-31: 50 mg via INTRAVENOUS

## 2021-07-31 MED ORDER — PROPOFOL 10 MG/ML IV BOLUS
INTRAVENOUS | Status: DC | PRN
  Administered 2021-07-31: 20 mg via INTRAVENOUS
  Administered 2021-07-31: 50 mg via INTRAVENOUS
  Administered 2021-07-31: 20 mg via INTRAVENOUS
  Administered 2021-07-31: 40 mg via INTRAVENOUS
  Administered 2021-07-31: 150 mg via INTRAVENOUS

## 2021-07-31 NOTE — Anesthesia Postprocedure Evaluation (Signed)
Anesthesia Post Note  Patient: Michael Harmon  Procedure(s) Performed: COLONOSCOPY WITH PROPOFOL ESOPHAGOGASTRODUODENOSCOPY (EGD) WITH PROPOFOL BIOPSY  Patient location during evaluation: Phase II Anesthesia Type: General Level of consciousness: awake and alert and oriented Pain management: pain level controlled Vital Signs Assessment: post-procedure vital signs reviewed and stable Respiratory status: spontaneous breathing and respiratory function stable Cardiovascular status: blood pressure returned to baseline and stable Postop Assessment: no apparent nausea or vomiting Anesthetic complications: no   No notable events documented.   Last Vitals:  Vitals:   07/31/21 0832 07/31/21 0837  BP: (!) 92/30 (!) 110/58  Pulse: 80   Resp: 20   Temp: 36.4 C   SpO2: 100%     Last Pain:  Vitals:   07/31/21 0832  TempSrc: Oral  PainSc: 0-No pain                 Alyssa Mancera C Adamarys Shall

## 2021-07-31 NOTE — Anesthesia Procedure Notes (Signed)
Date/Time: 07/31/2021 7:48 AM Performed by: Vista Deck, CRNA Pre-anesthesia Checklist: Patient identified, Emergency Drugs available, Suction available, Timeout performed and Patient being monitored Patient Re-evaluated:Patient Re-evaluated prior to induction Oxygen Delivery Method: Nasal Cannula

## 2021-07-31 NOTE — Progress Notes (Signed)
Glasses and clothes returned to patient.  Cell phone was left with dad in the waiting room.

## 2021-07-31 NOTE — Transfer of Care (Signed)
Immediate Anesthesia Transfer of Care Note  Patient: Michael Harmon  Procedure(s) Performed: COLONOSCOPY WITH PROPOFOL ESOPHAGOGASTRODUODENOSCOPY (EGD) WITH PROPOFOL BIOPSY  Patient Location: Short Stay  Anesthesia Type:General  Level of Consciousness: awake and patient cooperative  Airway & Oxygen Therapy: Patient Spontanous Breathing  Post-op Assessment: Report given to RN and Post -op Vital signs reviewed and stable  Post vital signs: Reviewed and stable  Last Vitals:  Vitals Value Taken Time  BP    Temp    Pulse    Resp    SpO2     SEE VITAL SIGN FLOW SHEET Last Pain:  Vitals:   07/31/21 0640  TempSrc: Oral  PainSc: 0-No pain         Complications: No notable events documented.

## 2021-07-31 NOTE — Op Note (Signed)
[facility] Patient Name: Michael Harmon Procedure Date: 07/31/2021 8:08 AM MRN: [id number] Date of Birth: [birth year] Attending MD: Norvel Richards , MD CSN: [id number] Age: 60 Admit Type: Outpatient Procedure:                Colonoscopy Indications:              High risk colon cancer surveillance: Personal                            history of colonic polyps Providers:                Norvel Richards, MD, Janeece Riggers, RN, Raphael Gibney, Technician Referring MD:              Medicines:                Propofol per Anesthesia Complications:            No immediate complications. Estimated Blood Loss:     Estimated blood loss was minimal. Procedure:                Pre-Anesthesia Assessment:                           - Prior to the procedure, a History and Physical                            was performed, and patient medications and                            allergies were reviewed. The patient's tolerance of                            previous anesthesia was also reviewed. The risks                            and benefits of the procedure and the sedation                            options and risks were discussed with the patient.                            All questions were answered, and informed consent                            was obtained. Prior Anticoagulants: The patient has                            taken no previous anticoagulant or antiplatelet                            agents. ASA Grade Assessment: IV - A patient with  severe systemic disease that is a constant threat                            to life. After reviewing the risks and benefits,                            the patient was deemed in satisfactory condition to                            undergo the procedure.                           After obtaining informed consent, the colonoscope                            was passed under direct vision.  Throughout the                            procedure, the patient's blood pressure, pulse, and                            oxygen saturations were monitored continuously. The                            [id number]) scope was introduced through the                            anus and advanced to the the cecum, identified by                            appendiceal orifice and ileocecal valve. The                            colonoscopy was performed without difficulty. The                            patient tolerated the procedure well. The quality                            of the bowel preparation was adequate. Scope In: 8:09:38 AM Scope Out: 8:22:18 AM Scope Withdrawal Time: 0 hours 9 minutes 1 second  Total Procedure Duration: 0 hours 12 minutes 40 seconds  Findings:      The perianal and digital rectal examinations were normal.      Non-bleeding internal hemorrhoids were found during retroflexion. The       hemorrhoids were moderate, medium-sized and Grade I (internal       hemorrhoids that do not prolapse). Diffusely slightly edematous mucosa       moderately friable. It oozed in several places with gentle scope contact.      Otherwise, The colon (entire examined portion) appeared normal.      The retroflexed view of the distal rectum and anal verge was normal and       showed no anal or rectal abnormalities. Impression:               -  Non-bleeding internal hemorrhoids. Portal                            colopathy.                           - The distal rectum and anal verge are normal on                            retroflexion view.                           - No specimens collected. Moderate Sedation:      Moderate (conscious) sedation was personally administered by an       anesthesia professional. The following parameters were monitored: oxygen       saturation, heart rate, blood pressure, respiratory rate, EKG, adequacy       of pulmonary ventilation, and response to  care. Recommendation:           - Patient has a contact number available for                            emergencies. The signs and symptoms of potential                            delayed complications were discussed with the                            patient. Return to normal activities tomorrow.                            Written discharge instructions were provided to the                            patient.                           - Advance diet as tolerated.                           - Continue present medications.                           - Repeat colonoscopy in 7 years for surveillance.                           - Return to GI office in 3 months. See EGD report. Procedure Code(s):        --- Professional ---                           [phone], Colonoscopy, flexible; diagnostic, including                            collection of specimen(s) by brushing or washing,  when performed (separate procedure) Diagnosis Code(s):        --- Professional ---                           Z86.010, Personal history of colonic polyps                           K64.0, First degree hemorrhoids CPT copyright 2019 [employer]. All rights reserved. The codes documented in this report are preliminary and upon coder review may  be revised to meet current compliance requirements. [name]. Loie Jahr, MD Norvel Richards, MD 07/31/2021 8:50:20 AM This report has been signed electronically. Number of Addenda: 0

## 2021-07-31 NOTE — H&P (Signed)
@LOGO @   Primary Care Physician:  Patient, No Pcp Per (Inactive) Primary Gastroenterologist:  Dr. [name] Pre-Procedure History & Physical: HPI:  Michael Harmon is a 60 y.o. male here for  surveillance colonoscopy.  History colonic adenoma.  History of esophageal varices status post band ligation previously.  He still had significant varices earlier in the year.  He also has pancytopenia.  History of ITP.  Progressive lowering of present platelet count most recently 43,000.  Past Medical History:  Diagnosis Date   Allergy    nose and sinus problems   Anemia    Asthma    Celiac disease    Cirrhosis, cryptogenic (HCC)    completed Hep A and B vaccines   DM (diabetes mellitus) (HCC)    GERD (gastroesophageal reflux disease)    Hyperlipidemia    diet controlled now with 100 pound weight loss   Hypertension    Narrow angle glaucoma suspect of both eyes    Renal cyst 09/03/2017   right   Situational depression 07/06/2017   Splenomegaly    Thrombocytopenia ([place])    hematology, ?related to chol med (tricor) and glimeripide, just being monitored, above 100,000    Past Surgical History:  Procedure Laterality Date   BIOPSY  07/09/2016   Procedure: BIOPSY;  Surgeon: [name], MD;  [facility];  Service: Endoscopy;;  duodenal, gastric, esophaageal   CLOSED MANIPULATION SHOULDER     COLONOSCOPY  11/2015   Dr. Posey Pronto: cecal bx neg for microscopic colitis. ascending colon polyp (adenomatous)   ESOPHAGEAL BANDING N/A 08/04/2017   Procedure: ESOPHAGEAL BANDING;  Surgeon: [name], MD;  [facility]: [facility];  Service: Endoscopy;  Laterality: N/A;  esophageal varices banding   ESOPHAGEAL BANDING N/A 10/13/2017   Procedure: ESOPHAGEAL BANDING;  Surgeon: [name], MD;  [facility]: [facility];  Service: Endoscopy;  Laterality: N/A;   ESOPHAGEAL BANDING N/A 04/09/2020   Procedure: ESOPHAGEAL BANDING;  Surgeon: [name], MD;  [facility];  Service:  Endoscopy;  Laterality: N/A;   ESOPHAGOGASTRODUODENOSCOPY N/A 07/09/2016   Procedure: ESOPHAGOGASTRODUODENOSCOPY (EGD);  Surgeon: [name], MD;  [facility];  Service: Endoscopy;  Laterality: N/A;  730   ESOPHAGOGASTRODUODENOSCOPY N/A 08/04/2017   Procedure: ESOPHAGOGASTRODUODENOSCOPY (EGD);  Surgeon: [name], MD;  [facility]: [facility];  Service: Endoscopy;  Laterality: N/A;  7:30am   ESOPHAGOGASTRODUODENOSCOPY N/A 10/13/2017   Dr. [name]: grade 2 esophageal varices status post banding, portal hypertensive gastropathy   ESOPHAGOGASTRODUODENOSCOPY  11/23/2019   Nicosha Struve: Esophageal varices, 4 columns of grade 2-3 status post banding.  Varices more prominent than seen in December 2018.  Portal gastropathy.  Normal-appearing small bowel.   ESOPHAGOGASTRODUODENOSCOPY (EGD) WITH PROPOFOL N/A 04/09/2020   Dr. [name]: 4 columns of grade 2/grade 3 esophageal varices somewhat recalcitrant.  Status post esophageal band ligation today.  Portal gastropathy.  Normal-appearing small bowel.    Prior to Admission medications   Medication Sig Start Date End Date Taking? Authorizing Provider  acetaminophen (TYLENOL) 325 MG tablet Take 650 mg by mouth every 6 (six) hours as needed (for pain.).   Yes [provider]  atorvastatin (LIPITOR) 20 MG tablet Take 1 tablet (20 mg total) by mouth daily. Patient taking differently: Take 20 mg by mouth in the morning. 07/21/18  Yes Hagler, Apolonio Schneiders, MD  benazepril-hydrochlorthiazide (LOTENSIN HCT) 10-12.5 MG tablet Take 1 tablet by mouth in the morning.   Yes [provider]  Ca Carbonate-Mag  Hydroxide (ROLAIDS PO) Take 1 tablet by mouth 4 (four) times daily as needed (heartburn).    Yes [provider]  Cholecalciferol (VITAMIN D3) 125 MCG (5000 UT) TABS Take 5,000 Units by mouth in the morning.   Yes [provider]  HUMALOG KWIKPEN 100 UNIT/ML KwikPen Inject 10-16 Units into the skin with breakfast, with lunch, and with  evening meal. 06/10/21  Yes [provider]  LEVEMIR FLEXTOUCH 100 UNIT/ML Pen Inject 36 Units into the skin at bedtime.  12/25/18  Yes [provider]  Multiple Vitamin (MULTIVITAMIN WITH MINERALS) TABS tablet Take 1 tablet by mouth in the morning.   Yes [provider]  polyethylene glycol-electrolytes (NULYTELY) 420 g solution As directed 06/03/21  Yes Nicolemarie Wooley, [name], MD    Allergies as of 06/03/2021 - Review Complete 05/19/2021  Allergen Reaction Noted   Penicillins Other (See Comments) 04/07/2016   Gluten meal Diarrhea 10/06/2017   Biaxin [clarithromycin] Tinitus    Prednisone Hives 11/21/2019    Family History  Problem Relation Age of Onset   Other Mother        chronic diarrhea   COPD Mother    Heart disease Mother        died at 44   Arthritis Mother    Cancer Mother    Depression Mother    Diabetes Mother    Hyperlipidemia Mother    Hypertension Mother    Stroke Mother    Arthritis Father    Asthma Father    Birth defects Father    Heart disease Father        aortic valve replaced   Heart disease Maternal Grandmother    Stroke Maternal Grandmother    Heart disease Maternal Grandfather    Stroke Maternal Grandfather    Diabetes Paternal Grandmother    Stroke Paternal Grandfather    Heart disease Paternal Grandfather    Liver disease Neg Hx    Colon cancer Neg Hx     Social History   Socioeconomic History   Marital status: Married    Spouse name: Melissa   Number of children: 0   Years of education: 14   Highest education level: Not on file  Occupational History   Occupation: Customer service manager, [employer], travels  Tobacco Use   Smoking status: Never   Smokeless tobacco: Never  Vaping Use   Vaping Use: Never used  Substance and Sexual Activity   Alcohol use: Yes    Alcohol/week: 0.0 standard drinks    Comment: rare, 1-2 drinks a month, on vacation   Drug use: No   Sexual activity: Not Currently  Other Topics  Concern   Not on file  Social History Narrative   Lives at home with wife Melissa   One dog.   Has no children.   Eats all food groups.   Works for [employer] and [employer].       Social Determinants of Health   Financial Resource Strain: Not on file  Food Insecurity: Not on file  Transportation Needs: Not on file  Physical Activity: Not on file  Stress: Not on file  Social Connections: Not on file  Intimate Partner Violence: Not on file    Review of Systems: See HPI, otherwise negative ROS  Physical Exam: BP (!) 147/55   Pulse 85   Temp 98.3 F (36.8 C) (Oral)   Resp 16   SpO2 100%  General:   Alert,  Well-developed, well-nourished, pleasant and cooperative  in NAD enopathy. Lungs:  Clear throughout to auscultation.   No wheezes, crackles, or rhonchi. No acute distress. Heart:  Regular rate and rhythm; no murmurs, clicks, rubs,  or gallops. Abdomen: Non-distended, normal bowel sounds.  Soft and nontender without appreciable mass or hepatosplenomegaly.  Pulses:  Normal pulses noted. Extremities:  Without clubbing or edema.  Impression/Plan:    60 year old with Nash cirrhosis.  History of ITP.  History of celiac disease.  Status post multiple sessions of band ligation of esophageal varices.  Platelet count is now well below 50,000.  I believe it would be unsafe to perform further banding in that clinical setting.  He is here for a surveillance colonoscopy.  Small polyps removed previously.  Recommendations I have offered the  patient a surveillance EGD  while he is here.  If the residual varices.  We will hold off on banding.  Would defer to him having an EGD at [facility] next time for potential banding.   Would consider a prophylactic platelet transfusion should future banding be needed.    So we will go with a look see EGD and a surveillance colonoscopy today as best practice.  The risks, benefits, limitations, imponderables and alternatives regarding both EGD  and colonoscopy have been reviewed with the patient. Questions have been answered. All parties agreeable.       Notice: This dictation was prepared with Dragon dictation along with smaller phrase technology. Any transcriptional errors that result from this process are unintentional and may not be corrected upon review.

## 2021-07-31 NOTE — Op Note (Signed)
[facility] Patient Name: Michael Harmon Procedure Date: 07/31/2021 7:23 AM MRN: [id number] Date of Birth: 03/09/1961 Attending MD: Norvel Richards , MD CSN: [id number] Age: 60 Admit Type: Outpatient Procedure:                Upper GI endoscopy Indications:              Esophageal varices Providers:                Norvel Richards, MD, Janeece Riggers, RN, Raphael Gibney, Technician Referring MD:              Medicines:                Propofol per Anesthesia Complications:            No immediate complications. Estimated Blood Loss:     5 to 10 cc Procedure:                Pre-Anesthesia Assessment:                           - Prior to the procedure, a History and Physical                            was performed, and patient medications and                            allergies were reviewed. The patient's tolerance of                            previous anesthesia was also reviewed. The risks                            and benefits of the procedure and the sedation                            options and risks were discussed with the patient.                            All questions were answered, and informed consent                            was obtained. Prior Anticoagulants: The patient has                            taken no previous anticoagulant or antiplatelet                            agents. ASA Grade Assessment: IV - A patient with                            severe systemic disease that is a constant threat  to life. After reviewing the risks and benefits,                            the patient was deemed in satisfactory condition to                            undergo the procedure.                           After obtaining informed consent, the endoscope was                            passed under direct vision. Throughout the                            procedure, the patient's blood pressure, pulse, and                             oxygen saturations were monitored continuously. The                            GIF-H190 ([id number]) scope was introduced through the                            mouth, and advanced to the second part of duodenum.                            The upper GI endoscopy was accomplished without                            difficulty. The patient tolerated the procedure                            well. Scope In: 7:54:32 AM Scope Out: 8:02:23 AM Total Procedure Duration: 0 hours 7 minutes 51 seconds  Findings:      (4) 6 centimeter columns of grade 2 varices distally. 1 area just above       the GE junction would get grade 3 writing. No bleeding stigmata       overlying mucosa. Normal EG junction easily traversed with the scope       gastric cavity empty.      Few scattered diffuse gastric erosions. On retroflexion, patient had 1       lobulated area at the cardia adjacent to the scope consistent with a       proximal Gastric varix. Pylorus patent. Examination first second third       portion of duodenum revealed normal-appearing duodenal mucosa. Ampulla       well seen and appeared normal. 2 biopsies of the gastric mucosa taken to       assess for H. pylori. Estimated blood loss of 5 to 10 cc.      No banding attempted due to preprocedure assessment and recommendations       given platelet count now well below 50,000. Impression:               - Persisting esophageal varices - grade 2/slight  grade 3. Single gastric varix- cardia. Somewhat                            improved over that seen prior to last banding                            session. No bands applied today given history of                            progressive thrombocytopenia (plts 43 K). History                            of ITP. Moderate Sedation:      Moderate (conscious) sedation was personally administered by an       anesthesia professional. The following parameters were monitored:  oxygen       saturation, heart rate, blood pressure, respiratory rate, EKG, adequacy       of pulmonary ventilation, and response to care. Recommendation:           - Patient has a contact number available for                            emergencies. The signs and symptoms of potential                            delayed complications were discussed with the                            patient. Return to normal activities tomorrow.                            Written discharge instructions were provided to the                            patient.                           - Advance diet as tolerated. ` Follow-up on path                            double check H. pylori status H. pylori As it is                            associated with ITP. He is seeing Dr. Delton Coombes                            tomorrow which is a great idea. His next EGD With                            potential banding needs to be done with advanced                            planning and probably best done at [facility] ( I would  recommend repeat EGD in 6 months). see colonoscopy                            report. Procedure Code(s):        --- Professional ---                           [id number], Esophagogastroduodenoscopy, flexible,                            transoral; diagnostic, including collection of                            specimen(s) by brushing or washing, when performed                            (separate procedure) Diagnosis Code(s):        --- Professional ---                           I85.00, Esophageal varices without bleeding CPT copyright 2019 American Medical Association. All rights reserved. The codes documented in this report are preliminary and upon coder review may  be revised to meet current compliance requirements. [name]. [name], MD Norvel Richards, MD 07/31/2021 8:42:21 AM This report has been signed electronically. Number of Addenda: 0

## 2021-07-31 NOTE — Discharge Instructions (Signed)
EGD Discharge instructions Please read the instructions outlined below and refer to this sheet in the next few weeks. These discharge instructions provide you with general information on caring for yourself after you leave the hospital. Your doctor may also give you specific instructions. While your treatment has been planned according to the most current medical practices available, unavoidable complications occasionally occur. If you have any problems or questions after discharge, please call your doctor. ACTIVITY You may resume your regular activity but move at a slower pace for the next 24 hours.  Take frequent rest periods for the next 24 hours.  Walking will help expel (get rid of) the air and reduce the bloated feeling in your abdomen.  No driving for 24 hours (because of the anesthesia (medicine) used during the test).  You may shower.  Do not sign any important legal documents or operate any machinery for 24 hours (because of the anesthesia used during the test).  NUTRITION Drink plenty of fluids.  You may resume your normal diet.  Begin with a light meal and progress to your normal diet.  Avoid alcoholic beverages for 24 hours or as instructed by your caregiver.  MEDICATIONS You may resume your normal medications unless your caregiver tells you otherwise.  WHAT YOU CAN EXPECT TODAY You may experience abdominal discomfort such as a feeling of fullness or "gas" pains.  FOLLOW-UP Your doctor will discuss the results of your test with you.  SEEK IMMEDIATE MEDICAL ATTENTION IF ANY OF THE FOLLOWING OCCUR: Excessive nausea (feeling sick to your stomach) and/or vomiting.  Severe abdominal pain and distention (swelling).  Trouble swallowing.  Temperature over 101 F (37.8 C).  Rectal bleeding or vomiting of blood.     Esophageal varices still there but look better than seen previously  Repeat EGD at [facility] in 6 months  I took samples of your stomach to check for infection  Keep  your appointment with Dr. Delton Coombes tomorrow   colonoscopy looked good today-no polyps; recommend repeat  colonoscopy in 7 years  Office visit with [place] in 3 months   at patient request, I called James at [place] findings and recommendations

## 2021-07-31 NOTE — Anesthesia Preprocedure Evaluation (Signed)
Anesthesia Evaluation  Patient identified by MRN, date of birth, ID band Patient awake    Reviewed: Allergy & Precautions, NPO status , Patient's Chart, lab work & pertinent test results  History of Anesthesia Complications Negative for: history of anesthetic complications  Airway Mallampati: II  TM Distance: >3 FB Neck ROM: Full    Dental  (+) Caps, Dental Advisory Given [place] :   Pulmonary asthma ,    Pulmonary exam normal breath sounds clear to auscultation       Cardiovascular Exercise Tolerance: Good hypertension, Pt. on medications Normal cardiovascular exam Rhythm:Regular Rate:Normal     Neuro/Psych PSYCHIATRIC DISORDERS Depression negative neurological ROS     GI/Hepatic GERD  Medicated,(+) Cirrhosis   Esophageal Varices    , Splenomegaly    Endo/Other  diabetes, Well Controlled, Type 2, Insulin Dependent  Renal/GU Renal disease     Musculoskeletal negative musculoskeletal ROS (+)   Abdominal   Peds  Hematology  (+) Blood dyscrasia (thrombocytopenia, platelets - 43), anemia ,   Anesthesia Other Findings   Reproductive/Obstetrics negative OB ROS                            Anesthesia Physical  Anesthesia Plan  ASA: 3  Anesthesia Plan: General   Post-op Pain Management:    Induction: Intravenous  PONV Risk Score and Plan: TIVA  Airway Management Planned: Nasal Cannula, Natural Airway and Simple Face Mask  Additional Equipment:   Intra-op Plan:   Post-operative Plan:   Informed Consent: I have reviewed the patients History and Physical, chart, labs and discussed the procedure including the risks, benefits and alternatives for the proposed anesthesia with the patient or authorized representative who has indicated his/her understanding and acceptance.     Dental advisory given  Plan Discussed with: CRNA and Surgeon  Anesthesia Plan Comments:          Anesthesia Quick Evaluation

## 2021-08-01 ENCOUNTER — Inpatient Hospital Stay (HOSPITAL_COMMUNITY): Payer: BC Managed Care – PPO | Attending: Hematology and Oncology | Admitting: Hematology and Oncology

## 2021-08-01 ENCOUNTER — Encounter (HOSPITAL_COMMUNITY): Payer: Self-pay | Admitting: Hematology and Oncology

## 2021-08-01 DIAGNOSIS — D696 Thrombocytopenia, unspecified: Secondary | ICD-10-CM | POA: Diagnosis not present

## 2021-08-01 LAB — SURGICAL PATHOLOGY

## 2021-08-01 NOTE — Progress Notes (Signed)
Michael Harmon CONSULT NOTE  Patient Care Team: Patient, No Pcp Per (Inactive) as PCP - General (General Practice) [name], [name], MD as Consulting Physician (Gastroenterology)  CHIEF COMPLAINTS/PURPOSE OF CONSULTATION:  ITP  ASSESSMENT & PLAN:  This is a very pleasant 60 yr old male with chronic progressive thrombocytopenia, cirrhosis referred to hematology for further recommendations. He feel well except for some fatigue, follows up with [facility] hematology. He denies any complaints today for me. No bleeding or bruising I have reviewed his labs which showed progressive thrombocytopenia,  He is here for a virtual visit given Darcel Bayley and weather conditions. Although this is could be from progressive cirrhosis, we would recommend further evaluation for nutritional deficiency, hemolysis, coagulopathy, nutritional deficiency I will arrange for a FU in [redacted] weeks along with lab visit same day, so we can complete physical exam and labs the same day. He was also encouraged to reach out to his [facility] because he has a grade 3 varix which may need banding. He may need a unit of platelets prior to procedure, if platelet count is under 50 K. He will discuss this with them. No indication for transfusion unless there is an upcoming procedure as long as platelet count is greater than 20 K. Age appropriate cancer screening recommended.  HISTORY OF PRESENTING ILLNESS:  Michael Harmon 60 y.o. male is here because of thrombocytopenia.  This is a very pleasant patient who is here for a telephone visit for thrombocytopenia.  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 Mr Nohr apparently was followed by hematology 4/5 yrs ago and was found to have thrombocytopenia. He then continued follow up with gastroenterology since his thrombocytopenia was thought to be related to cirrhosis. 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 No bleeding or bruising spontaneously He had endoscopy and colonoscopy yesterday, had grade 3 varix but not banded becaue of low platelet count He never needed platelet transfusions in the past. No change in breathing except for fatigue. Has celiac at baseline, has diarrhea at baseline. No change in urinary habits No new neurological complaints.  Rest of the pertinent 10 point ROS reviewed and neg.  MEDICAL HISTORY:  Past Medical History:  Diagnosis Date   Allergy    nose and sinus problems   Anemia    Asthma    Celiac disease    Cirrhosis, cryptogenic (HCC)    completed Hep A and B vaccines   DM (diabetes mellitus) (HCC)    GERD (gastroesophageal reflux disease)    Hyperlipidemia    diet controlled now with 100 pound weight loss   Hypertension    Narrow angle glaucoma suspect of both eyes    Renal cyst 09/03/2017   right   Situational depression 07/06/2017   Splenomegaly    Thrombocytopenia ([place])    hematology, ?related to chol med (tricor) and glimeripide, just being monitored, above 100,000    SURGICAL HISTORY: Past Surgical History:  Procedure Laterality Date   BIOPSY  07/09/2016   Procedure: BIOPSY;  Surgeon: [name], MD;  Location: [facility];  Service: Endoscopy;;  duodenal, gastric, esophaageal   CLOSED MANIPULATION SHOULDER     COLONOSCOPY  11/2015   Dr. Posey Pronto: cecal bx neg for microscopic colitis. ascending colon polyp (adenomatous)  ESOPHAGEAL BANDING N/A 08/04/2017   Procedure: ESOPHAGEAL BANDING;  Surgeon: [name], MD;  Location: [facility];  Service: Endoscopy;  Laterality: N/A;  esophageal varices banding   ESOPHAGEAL BANDING N/A 10/13/2017   Procedure: ESOPHAGEAL BANDING;  Surgeon: [name], MD;  Location: [facility];  Service: Endoscopy;  Laterality: N/A;   ESOPHAGEAL BANDING N/A  04/09/2020   Procedure: ESOPHAGEAL BANDING;  Surgeon: [name], MD;  Location: [facility];  Service: Endoscopy;  Laterality: N/A;   ESOPHAGOGASTRODUODENOSCOPY N/A 07/09/2016   Procedure: ESOPHAGOGASTRODUODENOSCOPY (EGD);  Surgeon: [name], MD;  [facility]: [facility];  Service: Endoscopy;  Laterality: N/A;  730   ESOPHAGOGASTRODUODENOSCOPY N/A 08/04/2017   Procedure: ESOPHAGOGASTRODUODENOSCOPY (EGD);  Surgeon: [name], MD;  [facility]: [facility];  Service: Endoscopy;  Laterality: N/A;  7:30am   ESOPHAGOGASTRODUODENOSCOPY N/A 10/13/2017   Dr. [name]: grade 2 esophageal varices status post banding, portal hypertensive gastropathy   ESOPHAGOGASTRODUODENOSCOPY  11/23/2019   [name]: Esophageal varices, 4 columns of grade 2-3 status post banding.  Varices more prominent than seen in December 2018.  Portal gastropathy.  Normal-appearing small bowel.   ESOPHAGOGASTRODUODENOSCOPY (EGD) WITH PROPOFOL N/A 04/09/2020   Dr. [name]: 4 columns of grade 2/grade 3 esophageal varices somewhat recalcitrant.  Status post esophageal band ligation today.  Portal gastropathy.  Normal-appearing small bowel.    SOCIAL HISTORY: Social History   Socioeconomic History   Marital status: Married    Spouse name: Melissa   Number of children: 0   Years of education: 14   Highest education level: Not on file  Occupational History   Occupation: Customer service manager, [employer], travels  Tobacco Use   Smoking status: Never   Smokeless tobacco: Never  Vaping Use   Vaping Use: Never used  Substance and Sexual Activity   Alcohol use: Yes    Alcohol/week: 0.0 standard drinks    Comment: rare, 1-2 drinks a month, on vacation   Drug use: No   Sexual activity: Not Currently  Other Topics Concern   Not on file  Social History Narrative   Lives at home with wife Melissa   One dog.   Has no children.   Eats all food groups.   Works for [employer] and [employer].        Social Determinants of Health   Financial Resource Strain: Not on file  Food Insecurity: Not on file  Transportation Needs: Not on file  Physical Activity: Not on file  Stress: Not on file  Social Connections: Not on file  Intimate Partner Violence: Not on file    FAMILY HISTORY: Family History  Problem Relation Age of Onset   Other Mother        chronic diarrhea   COPD Mother    Heart disease Mother        died at 27   Arthritis Mother    Cancer Mother    Depression Mother    Diabetes Mother    Hyperlipidemia Mother    Hypertension Mother    Stroke Mother    Arthritis Father    Asthma Father    Birth defects Father    Heart disease Father        aortic valve replaced   Heart disease Maternal Grandmother    Stroke Maternal Grandmother    Heart disease Maternal Grandfather    Stroke Maternal Grandfather    Diabetes Paternal Grandmother    Stroke Paternal Grandfather  Heart disease Paternal Grandfather    Liver disease Neg Hx    Colon cancer Neg Hx     ALLERGIES:  is allergic to penicillins, gluten meal, biaxin [clarithromycin], and prednisone.  MEDICATIONS:  Current Outpatient Medications  Medication Sig Dispense Refill   acetaminophen (TYLENOL) 325 MG tablet Take 650 mg by mouth every 6 (six) hours as needed (for pain.).     atorvastatin (LIPITOR) 20 MG tablet Take 1 tablet (20 mg total) by mouth daily. (Patient taking differently: Take 20 mg by mouth in the morning.) 90 tablet 1   benazepril-hydrochlorthiazide (LOTENSIN HCT) 10-12.5 MG tablet Take 1 tablet by mouth in the morning.     Ca Carbonate-Mag Hydroxide (ROLAIDS PO) Take 1 tablet by mouth 4 (four) times daily as needed (heartburn).      Cholecalciferol (VITAMIN D3) 125 MCG (5000 UT) TABS Take 5,000 Units by mouth in the morning.     HUMALOG KWIKPEN 100 UNIT/ML KwikPen Inject 10-16 Units into the skin with breakfast, with lunch, and with evening meal.     LEVEMIR FLEXTOUCH 100 UNIT/ML Pen Inject 36  Units into the skin at bedtime.      Multiple Vitamin (MULTIVITAMIN WITH MINERALS) TABS tablet Take 1 tablet by mouth in the morning.     polyethylene glycol-electrolytes (NULYTELY) 420 g solution As directed 4000 mL 0   No current facility-administered medications for this visit.   PHYSICAL EXAMINATION: ECOG PERFORMANCE STATUS: 0 - Asymptomatic  There were no vitals filed for this visit. There were no vitals filed for this visit.  PE deferred, telephone visit  LABORATORY DATA:  I have reviewed the data as listed Lab Results  Component Value Date   WBC 3.0 (L) 07/29/2021   HGB 11.0 (L) 07/29/2021   HCT 33.1 (L) 07/29/2021   MCV 96.8 07/29/2021   PLT 43 (L) 07/29/2021     Chemistry      Component Value Date/Time   NA 136 07/29/2021 0923   K 5.0 07/29/2021 0923   CL 110 07/29/2021 0923   CO2 21 (L) 07/29/2021 0923   BUN 14 07/29/2021 0923   CREATININE 1.07 07/29/2021 0923   CREATININE 1.06 03/19/2021 0959      Component Value Date/Time   CALCIUM 8.7 (L) 07/29/2021 0923   ALKPHOS 126 07/29/2021 0923   AST 54 (H) 07/29/2021 0923   ALT 40 07/29/2021 0923   BILITOT 0.8 07/29/2021 0923       RADIOGRAPHIC STUDIES: I have personally reviewed the radiological images as listed and agreed with the findings in the report. No results found.  All questions were answered. The patient knows to call the clinic with any problems, questions or concerns. I spent 30 minutes in the care of this patient including History, review of records, counseling and coordination of care.  I connected with  Michael Harmon on 08/01/21 by telephone application and verified that I am speaking with the correct person using two identifiers.   I discussed the limitations of evaluation and management by telemedicine. The patient expressed understanding and agreed to proceed.     Benay Pike, MD 08/01/2021 9:42 AM

## 2021-08-02 ENCOUNTER — Encounter: Payer: Self-pay | Admitting: Internal Medicine

## 2021-08-04 ENCOUNTER — Ambulatory Visit: Payer: BC Managed Care – PPO | Admitting: Urology

## 2021-08-07 ENCOUNTER — Encounter (HOSPITAL_COMMUNITY): Payer: Self-pay | Admitting: Internal Medicine

## 2021-08-12 ENCOUNTER — Other Ambulatory Visit (HOSPITAL_COMMUNITY): Payer: Self-pay | Admitting: Physician Assistant

## 2021-08-12 DIAGNOSIS — D696 Thrombocytopenia, unspecified: Secondary | ICD-10-CM
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
## 2021-08-13 ENCOUNTER — Telehealth: Payer: Self-pay | Admitting: Internal Medicine

## 2021-08-13 ENCOUNTER — Inpatient Hospital Stay (HOSPITAL_COMMUNITY): Payer: BC Managed Care – PPO | Attending: Hematology

## 2021-08-13 ENCOUNTER — Inpatient Hospital Stay (HOSPITAL_BASED_OUTPATIENT_CLINIC_OR_DEPARTMENT_OTHER): Payer: BC Managed Care – PPO | Admitting: Physician Assistant

## 2021-08-13 ENCOUNTER — Other Ambulatory Visit: Payer: Self-pay

## 2021-08-13 ENCOUNTER — Encounter (HOSPITAL_COMMUNITY): Payer: Self-pay | Admitting: Physician Assistant

## 2021-08-13 VITALS — BP 121/55 | HR 68 | Temp 97.1°F | Resp 18 | Wt 221.1 lb

## 2021-08-13 DIAGNOSIS — K900 Celiac disease: Secondary | ICD-10-CM | POA: Diagnosis not present

## 2021-08-13 DIAGNOSIS — R161 Splenomegaly, not elsewhere classified: Secondary | ICD-10-CM | POA: Insufficient documentation

## 2021-08-13 DIAGNOSIS — D500 Iron deficiency anemia secondary to blood loss (chronic): Secondary | ICD-10-CM | POA: Diagnosis not present

## 2021-08-13 DIAGNOSIS — D696 Thrombocytopenia, unspecified: Secondary | ICD-10-CM | POA: Diagnosis present

## 2021-08-13 DIAGNOSIS — R7402 Elevation of levels of lactic acid dehydrogenase (LDH): Secondary | ICD-10-CM | POA: Diagnosis not present

## 2021-08-13 DIAGNOSIS — D708 Other neutropenia: Secondary | ICD-10-CM

## 2021-08-13 DIAGNOSIS — I10 Essential (primary) hypertension: Secondary | ICD-10-CM | POA: Diagnosis not present

## 2021-08-13 DIAGNOSIS — D649 Anemia, unspecified: Secondary | ICD-10-CM | POA: Diagnosis not present

## 2021-08-13 DIAGNOSIS — D72819 Decreased white blood cell count, unspecified: Secondary | ICD-10-CM | POA: Diagnosis not present

## 2021-08-13 DIAGNOSIS — E119 Type 2 diabetes mellitus without complications: Secondary | ICD-10-CM | POA: Insufficient documentation

## 2021-08-13 DIAGNOSIS — E611 Iron deficiency: Secondary | ICD-10-CM | POA: Insufficient documentation

## 2021-08-13 DIAGNOSIS — K7460 Unspecified cirrhosis of liver: Secondary | ICD-10-CM | POA: Diagnosis not present

## 2021-08-13 DIAGNOSIS — I8500 Esophageal varices without bleeding: Secondary | ICD-10-CM | POA: Insufficient documentation

## 2021-08-13 DIAGNOSIS — D508 Other iron deficiency anemias: Secondary | ICD-10-CM | POA: Insufficient documentation

## 2021-08-13 HISTORY — DX: Iron deficiency anemia secondary to blood loss (chronic): D50.0

## 2021-08-13 LAB — CBC WITH DIFFERENTIAL/PLATELET
Abs Immature Granulocytes: 0.01 10*3/uL (ref 0.00–0.07)
Basophils Absolute: 0 10*3/uL (ref 0.0–0.1)
Basophils Relative: 1 %
Eosinophils Absolute: 0.3 10*3/uL (ref 0.0–0.5)
Eosinophils Relative: 8 %
HCT: 34 % — ABNORMAL LOW (ref 39.0–52.0)
Hemoglobin: 11.3 g/dL — ABNORMAL LOW (ref 13.0–17.0)
Immature Granulocytes: 0 %
Lymphocytes Relative: 26 %
Lymphs Abs: 0.9 10*3/uL (ref 0.7–4.0)
MCH: 32.2 pg (ref 26.0–34.0)
MCHC: 33.2 g/dL (ref 30.0–36.0)
MCV: 96.9 fL (ref 80.0–100.0)
Monocytes Absolute: 0.4 10*3/uL (ref 0.1–1.0)
Monocytes Relative: 12 %
Neutro Abs: 1.7 10*3/uL (ref 1.7–7.7)
Neutrophils Relative %: 53 %
Platelets: 43 10*3/uL — ABNORMAL LOW (ref 150–400)
RBC: 3.51 MIL/uL — ABNORMAL LOW (ref 4.22–5.81)
RDW: 14.3 % (ref 11.5–15.5)
WBC: 3.2 10*3/uL — ABNORMAL LOW (ref 4.0–10.5)
nRBC: 0 % (ref 0.0–0.2)

## 2021-08-13 LAB — COMPREHENSIVE METABOLIC PANEL
ALT: 40 U/L (ref 0–44)
AST: 50 U/L — ABNORMAL HIGH (ref 15–41)
Albumin: 3.4 g/dL — ABNORMAL LOW (ref 3.5–5.0)
Alkaline Phosphatase: 130 U/L — ABNORMAL HIGH (ref 38–126)
Anion gap: 5 (ref 5–15)
BUN: 15 mg/dL (ref 8–23)
CO2: 21 mmol/L — ABNORMAL LOW (ref 22–32)
Calcium: 8.8 mg/dL — ABNORMAL LOW (ref 8.9–10.3)
Chloride: 110 mmol/L (ref 98–111)
Creatinine, Ser: 1.04 mg/dL (ref 0.61–1.24)
GFR, Estimated: >60 mL/min (ref >60)
Glucose, Bld: 138 mg/dL — ABNORMAL HIGH (ref 70–99)
Potassium: 4.8 mmol/L (ref 3.5–5.1)
Sodium: 136 mmol/L (ref 135–145)
Total Bilirubin: 1.4 mg/dL — ABNORMAL HIGH (ref 0.3–1.2)
Total Protein: 6.7 g/dL (ref 6.5–8.1)

## 2021-08-13 LAB — RETICULOCYTES
Immature Retic Fract: 9.9 % (ref 2.3–15.9)
RBC.: 3.45 MIL/uL — ABNORMAL LOW (ref 4.22–5.81)
Retic Count, Absolute: 48 10*3/uL (ref 19.0–186.0)
Retic Ct Pct: 1.4 % (ref 0.4–3.1)

## 2021-08-13 LAB — LACTATE DEHYDROGENASE: LDH: 201 U/L — ABNORMAL HIGH (ref 98–192)

## 2021-08-13 LAB — IRON AND TIBC
Iron: 80 ug/dL (ref 45–182)
Saturation Ratios: 23 % (ref 17.9–39.5)
TIBC: 345 ug/dL (ref 250–450)
UIBC: 265 ug/dL

## 2021-08-13 LAB — APTT: aPTT: 33 seconds (ref 24–36)

## 2021-08-13 LAB — FERRITIN: Ferritin: 31 ng/mL (ref 24–336)

## 2021-08-13 LAB — PROTIME-INR
INR: 1.2 (ref 0.8–1.2)
Prothrombin Time: 15 seconds (ref 11.4–15.2)

## 2021-08-13 LAB — VITAMIN B12: Vitamin B-12: 707 pg/mL (ref 180–914)

## 2021-08-13 NOTE — Telephone Encounter (Signed)
Per RMR recommendations. Do I need to add this to the recall or will we do a referral to [facility]?    His next EGD With potential banding needs to be done with advanced planning and probably best done at [facility] ( I would recommend repeat EGD in 6 months). see colonoscopy report.

## 2021-08-13 NOTE — Patient Instructions (Signed)
[place] at [facility] Discharge Instructions  You were seen today by Tarri Abernethy PA-C for your low platelets (thrombocytopenia), low blood cells (anemia), and low white blood cells (leukopenia).  THROMBOCYTOPENIA: As we have discussed, your low platelets are most likely related to your liver cirrhosis and enlarged spleen.  We have additional labs that we checked today which are pending.  We will call you with any major abnormalities, otherwise we will discuss these at your follow-up visit. We will contact your urologist and recommend that you receive 1 unit of platelets before your prostate biopsy on Monday. When your liver doctor is ready to perform EGD with banding of your varices, we recommend that your platelets be checked beforehand, and if they are less than 50,000, you would benefit from 1 unit of platelets before EGD with banding. Your low platelets and your varices place you at an increased risk of GI bleeding, which in some cases can be fatal.  Please seek immediate medical attention if you notice any large amounts of bright red blood in the toilet, or if you have any black tarry bowel movements.  Other symptoms of blood loss would be dizziness, lightheadedness, worsening fatigue, chest pain, and shortness of breath.  ANEMIA: Your low blood count and low iron may be due to losing small amounts of blood from your intestines.  You also have decreased ability to absorb iron due to your celiac disease.  We will treat this by giving IV iron (Venofer) x3 doses.  LEUKOPENIA: Your low white blood cells are also related to your cirrhosis.  However, they are only mildly lower than normal, and there is no cause for concern regarding your white blood cells at this time.  LABS: Return in 8 weeks for repeat labs  FOLLOW-UP APPOINTMENT: Office visit in 8 weeks, after labs   Thank you for choosing [place] at [facility] to provide your oncology  and hematology care.  To afford each patient quality time with our provider, please arrive at least 15 minutes before your scheduled appointment time.   If you have a lab appointment with the [facility] please come in thru the Main Entrance and check in at the main information desk.  You need to re-schedule your appointment should you arrive 10 or more minutes late.  We strive to give you quality time with our providers, and arriving late affects you and other patients whose appointments are after yours.  Also, if you no show three or more times for appointments you may be dismissed from the clinic at the providers discretion.     Again, thank you for choosing [facility].  Our hope is that these requests will decrease the amount of time that you wait before being seen by our physicians.       _____________________________________________________________  Should you have questions after your visit to [facility], please contact our office at [phone] and follow the prompts.  Our office hours are 8:00 a.m. and 4:30 p.m. Monday - Friday.  Please note that voicemails left after 4:00 p.m. may not be returned until the following business day.  We are closed weekends and major holidays.  You do have access to a nurse 24-7, just call the main number to the clinic [phone] and do not press any options, hold on the line and a nurse will answer the phone.    For prescription refill requests, have your pharmacy contact our  office and allow 72 hours.    Due to Covid, you will need to wear a mask upon entering the hospital. If you do not have a mask, a mask will be given to you at the Main Entrance upon arrival. For doctor visits, patients may have 1 support person age 58 or older with them. For treatment visits, patients can not have anyone with them due to social distancing guidelines and our immunocompromised population.

## 2021-08-13 NOTE — Telephone Encounter (Signed)
[facility] would not be booking out that far for procedures yet. Forward to Dr. [name] if he wants patient seen in the clinic at [facility] or just for procedures?

## 2021-08-14 ENCOUNTER — Other Ambulatory Visit (HOSPITAL_COMMUNITY): Payer: Self-pay | Admitting: Physician Assistant

## 2021-08-14 DIAGNOSIS — D696 Thrombocytopenia, unspecified: Secondary | ICD-10-CM

## 2021-08-14 LAB — FOLATE RBC
Folate, Hemolysate: 500 ng/mL
Folate, RBC: 1488 ng/mL (ref >498)
Hematocrit: 33.6 % — ABNORMAL LOW (ref 37.5–51.0)

## 2021-08-14 LAB — RHEUMATOID FACTOR: Rheumatoid fact SerPl-aCnc: <10 IU/mL (ref <14.0)

## 2021-08-14 LAB — HOMOCYSTEINE: Homocysteine: 17.6 umol/L — ABNORMAL HIGH (ref 0.0–17.2)

## 2021-08-14 LAB — COPPER, SERUM: Copper: 101 ug/dL (ref 69–132)

## 2021-08-14 LAB — PATHOLOGIST SMEAR REVIEW

## 2021-08-14 LAB — ANA: Anti Nuclear Antibody (ANA): NEGATIVE

## 2021-08-15 LAB — METHYLMALONIC ACID, SERUM: Methylmalonic Acid, Quantitative: 230 nmol/L (ref 0–378)

## 2021-08-18 ENCOUNTER — Other Ambulatory Visit: Payer: Self-pay | Admitting: Urology

## 2021-08-18 ENCOUNTER — Inpatient Hospital Stay (HOSPITAL_COMMUNITY): Payer: BC Managed Care – PPO

## 2021-08-18 ENCOUNTER — Ambulatory Visit (HOSPITAL_COMMUNITY)
Admission: RE | Admit: 2021-08-18 | Discharge: 2021-08-18 | Disposition: A | Discharge status: Home / Self Care | Payer: BC Managed Care – PPO | Source: Ambulatory Visit | Attending: Urology | Admitting: Urology

## 2021-08-18 ENCOUNTER — Ambulatory Visit (INDEPENDENT_AMBULATORY_CARE_PROVIDER_SITE_OTHER): Payer: BC Managed Care – PPO | Admitting: Urology

## 2021-08-18 ENCOUNTER — Encounter (HOSPITAL_COMMUNITY): Payer: Self-pay

## 2021-08-18 ENCOUNTER — Other Ambulatory Visit: Payer: Self-pay

## 2021-08-18 ENCOUNTER — Other Ambulatory Visit (HOSPITAL_COMMUNITY): Payer: Self-pay | Admitting: Physician Assistant

## 2021-08-18 VITALS — BP 113/54 | HR 62 | Temp 98.0°F | Resp 18 | Wt 219.6 lb

## 2021-08-18 DIAGNOSIS — D696 Thrombocytopenia, unspecified: Secondary | ICD-10-CM

## 2021-08-18 DIAGNOSIS — C61 Malignant neoplasm of prostate: Secondary | ICD-10-CM | POA: Diagnosis not present

## 2021-08-18 DIAGNOSIS — R972 Elevated prostate specific antigen [PSA]: Secondary | ICD-10-CM | POA: Insufficient documentation

## 2021-08-18 LAB — CBC WITH DIFFERENTIAL/PLATELET
Abs Immature Granulocytes: 0.01 10*3/uL (ref 0.00–0.07)
Basophils Absolute: 0 10*3/uL (ref 0.0–0.1)
Basophils Relative: 1 %
Eosinophils Absolute: 0.3 10*3/uL (ref 0.0–0.5)
Eosinophils Relative: 9 %
HCT: 33.4 % — ABNORMAL LOW (ref 39.0–52.0)
Hemoglobin: 10.9 g/dL — ABNORMAL LOW (ref 13.0–17.0)
Immature Granulocytes: 0 %
Lymphocytes Relative: 28 %
Lymphs Abs: 0.9 10*3/uL (ref 0.7–4.0)
MCH: 32.5 pg (ref 26.0–34.0)
MCHC: 32.6 g/dL (ref 30.0–36.0)
MCV: 99.7 fL (ref 80.0–100.0)
Monocytes Absolute: 0.4 10*3/uL (ref 0.1–1.0)
Monocytes Relative: 12 %
Neutro Abs: 1.7 10*3/uL (ref 1.7–7.7)
Neutrophils Relative %: 50 %
Platelets: 43 10*3/uL — ABNORMAL LOW (ref 150–400)
RBC: 3.35 MIL/uL — ABNORMAL LOW (ref 4.22–5.81)
RDW: 14.6 % (ref 11.5–15.5)
WBC: 3.3 10*3/uL — ABNORMAL LOW (ref 4.0–10.5)
nRBC: 0 % (ref 0.0–0.2)

## 2021-08-18 LAB — TYPE AND SCREEN
ABO/RH(D): B NEG
Antibody Screen: NEGATIVE

## 2021-08-18 MED ORDER — LIDOCAINE HCL (PF) 1 % IJ SOLN: 2.1000 mL | Freq: Once | INTRAMUSCULAR | Status: AC

## 2021-08-18 MED ORDER — LIDOCAINE HCL (PF) 2 % IJ SOLN
INTRAMUSCULAR | Status: AC
  Filled 2021-08-18: qty 10

## 2021-08-18 MED ORDER — CEFTRIAXONE SODIUM 1 G IJ SOLR: 1.0000 g | INTRAMUSCULAR | Status: DC

## 2021-08-18 MED ORDER — GENTAMICIN SULFATE 40 MG/ML IJ SOLN
INTRAMUSCULAR | Status: AC
  Filled 2021-08-18: qty 4

## 2021-08-18 MED ORDER — GENTAMICIN SULFATE 40 MG/ML IJ SOLN
160.0000 mg | Freq: Once | INTRAMUSCULAR | Status: AC
  Administered 2021-08-18: 160 mg via INTRAMUSCULAR

## 2021-08-18 MED ORDER — ACETAMINOPHEN 325 MG PO TABS
650.0000 mg | ORAL_TABLET | Freq: Once | ORAL | Status: AC
  Administered 2021-08-18: 650 mg via ORAL
  Filled 2021-08-18: qty 2

## 2021-08-18 MED ORDER — LIDOCAINE HCL (PF) 2 % IJ SOLN
10.0000 mL | Freq: Once | INTRAMUSCULAR | Status: AC
  Administered 2021-08-18: 10 mL

## 2021-08-18 MED ORDER — DIPHENHYDRAMINE HCL 25 MG PO CAPS
25.0000 mg | ORAL_CAPSULE | Freq: Once | ORAL | Status: AC
  Administered 2021-08-18: 25 mg via ORAL
  Filled 2021-08-18: qty 1

## 2021-08-18 MED ORDER — CEFTRIAXONE SODIUM 1 G IJ SOLR
INTRAMUSCULAR | Status: AC
  Administered 2021-08-18: 1 g via INTRAMUSCULAR
  Filled 2021-08-18: qty 10

## 2021-08-18 MED ORDER — SODIUM CHLORIDE 0.9% IV SOLUTION
250.0000 mL | Freq: Once | INTRAVENOUS | Status: AC
  Administered 2021-08-18: 250 mL via INTRAVENOUS

## 2021-08-18 MED ORDER — LIDOCAINE HCL (PF) 1 % IJ SOLN
INTRAMUSCULAR | Status: AC
  Administered 2021-08-18: 2.1 mL via INTRADERMAL
  Filled 2021-08-18: qty 5

## 2021-08-18 NOTE — Progress Notes (Signed)
PT tolerated prostate biopsy procedure well today. PT given IM gentamicin and IM rocephin since he forgot to pre-med with Levaquin prescribed at home verbal order and okay from Dr. Junious Silk. Labs obtained and sent for pathology. PT ambulatory at discharge with no acute distress noted and verbalized understanding of discharge instructions.

## 2021-08-18 NOTE — Patient Instructions (Signed)
[facility]  Discharge Instructions: Thank you for choosing [facility] to provide your oncology and hematology care.  If you have a lab appointment with the [facility], please come in thru the Main Entrance and check in at the main information desk.  Wear comfortable clothing and clothing appropriate for easy access to any Portacath or PICC line.   We strive to give you quality time with your provider. You may need to reschedule your appointment if you arrive late (15 or more minutes).  Arriving late affects you and other patients whose appointments are after yours.  Also, if you miss three or more appointments without notifying the office, you may be dismissed from the clinic at the provider's discretion.      For prescription refill requests, have your pharmacy contact our office and allow 72 hours for refills to be completed.    Today you received 1 unit of platelets.     BELOW ARE SYMPTOMS THAT SHOULD BE REPORTED IMMEDIATELY: *FEVER GREATER THAN 100.4 F (38 C) OR HIGHER *CHILLS OR SWEATING *NAUSEA AND VOMITING THAT IS NOT CONTROLLED WITH YOUR NAUSEA MEDICATION *UNUSUAL SHORTNESS OF BREATH *UNUSUAL BRUISING OR BLEEDING *URINARY PROBLEMS (pain or burning when urinating, or frequent urination) *BOWEL PROBLEMS (unusual diarrhea, constipation, pain near the anus) TENDERNESS IN MOUTH AND THROAT WITH OR WITHOUT PRESENCE OF ULCERS (sore throat, sores in mouth, or a toothache) UNUSUAL RASH, SWELLING OR PAIN  UNUSUAL VAGINAL DISCHARGE OR ITCHING   Items with * indicate a potential emergency and should be followed up as soon as possible or go to the Emergency Department if any problems should occur.  Please show the CHEMOTHERAPY ALERT CARD or IMMUNOTHERAPY ALERT CARD at check-in to the Emergency Department and triage nurse.  Should you have questions after your visit or need to cancel or reschedule your appointment, please contact [facility]  [phone]  and follow the prompts.  Office hours are 8:00 a.m. to 4:30 p.m. Monday - Friday. Please note that voicemails left after 4:00 p.m. may not be returned until the following business day.  We are closed weekends and major holidays. You have access to a nurse at all times for urgent questions. Please call the main number to the clinic [phone] and follow the prompts.  For any non-urgent questions, you may also contact your provider using MyChart. We now offer e-Visits for anyone 24 and older to request care online for non-urgent symptoms. For details visit [email or web address].   Also download the MyChart app! Go to the app store, search "MyChart", open the app, select [facility], and log in with your MyChart username and password.  Due to Covid, a mask is required upon entering the hospital/clinic. If you do not have a mask, one will be given to you upon arrival. For doctor visits, patients may have 1 support person aged 49 or older with them. For treatment visits, patients cannot have anyone with them due to current Covid guidelines and our immunocompromised population.

## 2021-08-18 NOTE — Progress Notes (Addendum)
Pt presents today for 1 unit of platelets per provider's order. Vital signs stable and pt voiced no complaints at this time. Attestation and consent obtained prior to transfusion.  Peripheral IV started with good blood return pre and post infusion.  1 unit of platelets given today per MD orders. Tolerated infusion without adverse affects. Vital signs stable. No complaints at this time. Discharged from clinic ambulatory in stable condition. Alert and oriented x 3. F/U with [facility] as scheduled.

## 2021-08-18 NOTE — Progress Notes (Signed)
Prostate Biopsy Procedure   Follow-up-  1) PSA elevation-patient requires a prior PSA of "3-4" and took something to lower it.  His June 2022 PSA was 7.4.  This was repeated about 6 weeks in August 2022 PSA was 8.2.  No prior biopsy.  2) BPH, urgency - AUASS =12. Took tamsulosin in the past. He has urgency and UUI. He recalls a prior PSA of "3-4" and took something to lower it. No constipation. No NG risk.  PVR 86 ml. UA clear.   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    He established with hematology and platelets today were 43,000.  He is undergoing platelet transfusion.  He has had no dysuria or gross hematuria.   Informed consent was obtained after discussing risks/benefits of the procedure.  A time out was performed to ensure correct patient identity.  Pre-Procedure: - Last PSA Level: 8.2 - Gentamicin and ceftriaxone given prophylactically-patient has penicillin listed as allergy but he reports this is something his mom told him about.  He has never had a rash or shortness of breath after taking antibiotics. -He forgot to take the Levaquin 500 mg and I discussed with him to take tomorrow.  -DRE: Prostate about 50 g and smooth without hard area or nodule. -Transrectal Ultrasound performed revealing a 62.7 gm prostate -No significant hypoechoic or median lobe noted  Procedure: - Prostate block performed using 10 cc 1% lidocaine and biopsies taken from sextant areas, a total of 12 under ultrasound guidance.  Post-Procedure: - Patient tolerated the procedure well - He was counseled to seek immediate medical attention if experiences any severe pain, significant bleeding, or fevers - Return in one week to discuss biopsy results

## 2021-08-19 ENCOUNTER — Inpatient Hospital Stay (HOSPITAL_COMMUNITY): Payer: BC Managed Care – PPO

## 2021-08-19 VITALS — BP 132/55 | HR 72 | Temp 97.1°F | Resp 17

## 2021-08-19 DIAGNOSIS — D696 Thrombocytopenia, unspecified: Secondary | ICD-10-CM | POA: Diagnosis not present

## 2021-08-19 DIAGNOSIS — D500 Iron deficiency anemia secondary to blood loss (chronic): Secondary | ICD-10-CM

## 2021-08-19 LAB — BPAM PLATELET PHERESIS
Blood Product Expiration Date: 202210202359
ISSUE DATE / TIME: 202210171111
Unit Type and Rh: 6200

## 2021-08-19 LAB — PREPARE PLATELET PHERESIS: Unit division: 0

## 2021-08-19 MED ORDER — SODIUM CHLORIDE 0.9 % IV SOLN
300.0000 mg | Freq: Once | INTRAVENOUS | Status: AC
  Administered 2021-08-19: 300 mg via INTRAVENOUS
  Filled 2021-08-19: qty 300

## 2021-08-19 MED ORDER — SODIUM CHLORIDE 0.9 % IV SOLN: Freq: Once | INTRAVENOUS | Status: AC

## 2021-08-19 MED ORDER — LORATADINE 10 MG PO TABS
10.0000 mg | ORAL_TABLET | Freq: Once | ORAL | Status: AC
  Administered 2021-08-19: 10 mg via ORAL
  Filled 2021-08-19: qty 1

## 2021-08-19 NOTE — Progress Notes (Signed)
Pt presents today for Venofer IV iron infusion per provider's order. Vital signs stable and pt voiced no new complaints at this time.  Peripheral IV started with good blood return pre and post infusion.  Venofer given today per MD orders. Tolerated infusion without adverse affects. Vital signs stable. No complaints at this time. Discharged from clinic ambulatory in stable condition. Alert and oriented x 3. F/U with [facility] as scheduled.

## 2021-08-19 NOTE — Patient Instructions (Signed)
[facility]  Discharge Instructions: Thank you for choosing [facility] to provide your oncology and hematology care.  If you have a lab appointment with the [facility], please come in thru the Main Entrance and check in at the main information desk.  Wear comfortable clothing and clothing appropriate for easy access to any Portacath or PICC line.   We strive to give you quality time with your provider. You may need to reschedule your appointment if you arrive late (15 or more minutes).  Arriving late affects you and other patients whose appointments are after yours.  Also, if you miss three or more appointments without notifying the office, you may be dismissed from the clinic at the provider's discretion.      For prescription refill requests, have your pharmacy contact our office and allow 72 hours for refills to be completed.    Today you received Venofer IV iron infusion.  BELOW ARE SYMPTOMS THAT SHOULD BE REPORTED IMMEDIATELY: *FEVER GREATER THAN 100.4 F (38 C) OR HIGHER *CHILLS OR SWEATING *NAUSEA AND VOMITING THAT IS NOT CONTROLLED WITH YOUR NAUSEA MEDICATION *UNUSUAL SHORTNESS OF BREATH *UNUSUAL BRUISING OR BLEEDING *URINARY PROBLEMS (pain or burning when urinating, or frequent urination) *BOWEL PROBLEMS (unusual diarrhea, constipation, pain near the anus) TENDERNESS IN MOUTH AND THROAT WITH OR WITHOUT PRESENCE OF ULCERS (sore throat, sores in mouth, or a toothache) UNUSUAL RASH, SWELLING OR PAIN  UNUSUAL VAGINAL DISCHARGE OR ITCHING   Items with * indicate a potential emergency and should be followed up as soon as possible or go to the Emergency Department if any problems should occur.  Please show the CHEMOTHERAPY ALERT CARD or IMMUNOTHERAPY ALERT CARD at check-in to the Emergency Department and triage nurse.  Should you have questions after your visit or need to cancel or reschedule your appointment, please contact [facility]  [phone]  and follow the prompts.  Office hours are 8:00 a.m. to 4:30 p.m. Monday - Friday. Please note that voicemails left after 4:00 p.m. may not be returned until the following business day.  We are closed weekends and major holidays. You have access to a nurse at all times for urgent questions. Please call the main number to the clinic [phone] and follow the prompts.  For any non-urgent questions, you may also contact your provider using MyChart. We now offer e-Visits for anyone 39 and older to request care online for non-urgent symptoms. For details visit [email or web address].   Also download the MyChart app! Go to the app store, search "MyChart", open the app, select [facility], and log in with your MyChart username and password.  Due to Covid, a mask is required upon entering the hospital/clinic. If you do not have a mask, one will be given to you upon arrival. For doctor visits, patients may have 1 support person aged 37 or older with them. For treatment visits, patients cannot have anyone with them due to current Covid guidelines and our immunocompromised population.

## 2021-08-25 ENCOUNTER — Telehealth: Payer: Self-pay | Admitting: Urology

## 2021-08-25 NOTE — Telephone Encounter (Signed)
I called and discussed Michael Harmon's biopsy results with him.  He is doing well today.  We went over his stage, grade and prognosis.  We went over the nature risk and benefits of active surveillance versus treatment.  We talked about surgery versus radiation.  All questions answered.  He will give it some thought and I will see him back to discuss.  Biopsy #02 August 2021 Favorable intermediate risk prostate cancer PSA 8.2 T1c Prostate 63 g (PSAD 0.13) Gleason 3+4 = 7 in 1 core, right, 30% (G4 40%) Gleason 3+3 = 6 in 1 core on the left, 10% 2/12

## 2021-08-26 ENCOUNTER — Encounter (HOSPITAL_COMMUNITY): Payer: Self-pay

## 2021-08-26 ENCOUNTER — Inpatient Hospital Stay (HOSPITAL_COMMUNITY): Payer: BC Managed Care – PPO

## 2021-08-26 ENCOUNTER — Other Ambulatory Visit: Payer: Self-pay

## 2021-08-26 VITALS — BP 117/50 | HR 66 | Temp 97.1°F | Resp 18

## 2021-08-26 DIAGNOSIS — D500 Iron deficiency anemia secondary to blood loss (chronic): Secondary | ICD-10-CM

## 2021-08-26 DIAGNOSIS — D696 Thrombocytopenia, unspecified: Secondary | ICD-10-CM | POA: Diagnosis not present

## 2021-08-26 MED ORDER — SODIUM CHLORIDE 0.9 % IV SOLN: Freq: Once | INTRAVENOUS | Status: AC

## 2021-08-26 MED ORDER — SODIUM CHLORIDE 0.9 % IV SOLN
300.0000 mg | Freq: Once | INTRAVENOUS | Status: AC
  Administered 2021-08-26: 300 mg via INTRAVENOUS
  Filled 2021-08-26: qty 10

## 2021-08-26 MED ORDER — LORATADINE 10 MG PO TABS
10.0000 mg | ORAL_TABLET | Freq: Once | ORAL | Status: AC
  Administered 2021-08-26: 10 mg via ORAL
  Filled 2021-08-26: qty 1

## 2021-08-26 NOTE — Progress Notes (Signed)
Michael Harmon here for venofer 300 mg.  No complaints today.  Pre-medications given.   Tolerated infusion well today without incidence.  Stable during and after treatment.  AVS reviewed. Vital signs stable prior to discharge,  Discharged in stable condition via walker.

## 2021-08-26 NOTE — Progress Notes (Signed)
Chaplain engaged in a follow-up visit with Michael Harmon.  He stated he was doing well but had not seen any changes since his last infusion.  He wanted to rest today.  Chaplain checked-in and offered support.     08/26/21 1000  Clinical Encounter Type  Visited With Patient  Visit Type Follow-up

## 2021-08-26 NOTE — Patient Instructions (Signed)
[facility]  Discharge Instructions: Thank you for choosing [facility] to provide your oncology and hematology care.  If you have a lab appointment with the [facility], please come in thru the Main Entrance and check in at the main information desk.  Wear comfortable clothing and clothing appropriate for easy access to any Portacath or PICC line.   We strive to give you quality time with your provider. You may need to reschedule your appointment if you arrive late (15 or more minutes).  Arriving late affects you and other patients whose appointments are after yours.  Also, if you miss three or more appointments without notifying the office, you may be dismissed from the clinic at the provider's discretion.      For prescription refill requests, have your pharmacy contact our office and allow 72 hours for refills to be completed.    Today you received the following chemotherapy and/or immunotherapy agents venofer 300 mg      To help prevent nausea and vomiting after your treatment, we encourage you to take your nausea medication as directed.  BELOW ARE SYMPTOMS THAT SHOULD BE REPORTED IMMEDIATELY: *FEVER GREATER THAN 100.4 F (38 C) OR HIGHER *CHILLS OR SWEATING *NAUSEA AND VOMITING THAT IS NOT CONTROLLED WITH YOUR NAUSEA MEDICATION *UNUSUAL SHORTNESS OF BREATH *UNUSUAL BRUISING OR BLEEDING *URINARY PROBLEMS (pain or burning when urinating, or frequent urination) *BOWEL PROBLEMS (unusual diarrhea, constipation, pain near the anus) TENDERNESS IN MOUTH AND THROAT WITH OR WITHOUT PRESENCE OF ULCERS (sore throat, sores in mouth, or a toothache) UNUSUAL RASH, SWELLING OR PAIN  UNUSUAL VAGINAL DISCHARGE OR ITCHING   Items with * indicate a potential emergency and should be followed up as soon as possible or go to the Emergency Department if any problems should occur.  Please show the CHEMOTHERAPY ALERT CARD or IMMUNOTHERAPY ALERT CARD at check-in to the Emergency  Department and triage nurse.  Should you have questions after your visit or need to cancel or reschedule your appointment, please contact [facility] [phone]  and follow the prompts.  Office hours are 8:00 a.m. to 4:30 p.m. Monday - Friday. Please note that voicemails left after 4:00 p.m. may not be returned until the following business day.  We are closed weekends and major holidays. You have access to a nurse at all times for urgent questions. Please call the main number to the clinic [phone] and follow the prompts.  For any non-urgent questions, you may also contact your provider using MyChart. We now offer e-Visits for anyone 59 and older to request care online for non-urgent symptoms. For details visit [email or web address].   Also download the MyChart app! Go to the app store, search "MyChart", open the app, select [facility], and log in with your MyChart username and password.  Due to Covid, a mask is required upon entering the hospital/clinic. If you do not have a mask, one will be given to you upon arrival. For doctor visits, patients may have 1 support person aged 22 or older with them. For treatment visits, patients cannot have anyone with them due to current Covid guidelines and our immunocompromised population.   Iron Sucrose Injection What is this medication? IRON SUCROSE (EYE ern SOO krose) treats low levels of iron (iron deficiency anemia) in people with kidney disease. Iron is a mineral that plays an important role in making red blood cells, which carry oxygen from your lungs to the rest of your body. This medicine may be  used for other purposes; ask your health care provider or pharmacist if you have questions. COMMON BRAND NAME(S): Venofer What should I tell my care team before I take this medication? They need to know if you have any of these conditions: Anemia not caused by low iron levels Heart disease High levels of iron in the blood Kidney  disease Liver disease An unusual or allergic reaction to iron, other medications, foods, dyes, or preservatives Pregnant or trying to get pregnant Breast-feeding How should I use this medication? This medication is for infusion into a vein. It is given in a hospital or clinic setting. Talk to your care team about the use of this medication in children. While this medication may be prescribed for children as young as 2 years for selected conditions, precautions do apply. Overdosage: If you think you have taken too much of this medicine contact a poison control center or emergency room at once. NOTE: This medicine is only for you. Do not share this medicine with others. What if I miss a dose? It is important not to miss your dose. Call your care team if you are unable to keep an appointment. What may interact with this medication? Do not take this medication with any of the following: Deferoxamine Dimercaprol Other iron products This medication may also interact with the following: Chloramphenicol Deferasirox This list may not describe all possible interactions. Give your health care provider a list of all the medicines, herbs, non-prescription drugs, or dietary supplements you use. Also tell them if you smoke, drink alcohol, or use illegal drugs. Some items may interact with your medicine. What should I watch for while using this medication? Visit your care team regularly. Tell your care team if your symptoms do not start to get better or if they get worse. You may need blood work done while you are taking this medication. You may need to follow a special diet. Talk to your care team. Foods that contain iron include: whole grains/cereals, dried fruits, beans, or peas, leafy green vegetables, and organ meats (liver, kidney). What side effects may I notice from receiving this medication? Side effects that you should report to your care team as soon as possible: Allergic reactions-skin rash,  itching, hives, swelling of the face, lips, tongue, or throat Low blood pressure-dizziness, feeling faint or lightheaded, blurry vision Shortness of breath Side effects that usually do not require medical attention (report to your care team if they continue or are bothersome): Flushing Headache Joint pain Muscle pain Nausea Pain, redness, or irritation at injection site This list may not describe all possible side effects. Call your doctor for medical advice about side effects. You may report side effects to FDA at 1-800-FDA-1088. Where should I keep my medication? This medication is given in a hospital or clinic and will not be stored at home. NOTE: This sheet is a summary. It may not cover all possible information. If you have questions about this medicine, talk to your doctor, pharmacist, or health care provider.  2022 Elsevier/Gold Standard (2021-01-14 12:52:06)

## 2021-08-28 NOTE — Telephone Encounter (Signed)
Manuela Schwartz please NIC and we will send referral procedure to [facility] in 6 months

## 2021-09-02 ENCOUNTER — Encounter: Payer: Self-pay | Admitting: Internal Medicine

## 2021-09-02 ENCOUNTER — Ambulatory Visit: Payer: BC Managed Care – PPO | Admitting: Internal Medicine

## 2021-09-02 ENCOUNTER — Other Ambulatory Visit: Payer: Self-pay

## 2021-09-02 VITALS — BP 129/66 | HR 79 | Temp 98.4°F | Ht 67.0 in | Wt 218.0 lb

## 2021-09-02 DIAGNOSIS — I10 Essential (primary) hypertension: Secondary | ICD-10-CM | POA: Diagnosis not present

## 2021-09-02 DIAGNOSIS — Z23 Encounter for immunization: Secondary | ICD-10-CM | POA: Diagnosis not present

## 2021-09-02 DIAGNOSIS — Z794 Long term (current) use of insulin: Secondary | ICD-10-CM

## 2021-09-02 DIAGNOSIS — K7460 Unspecified cirrhosis of liver: Secondary | ICD-10-CM

## 2021-09-02 DIAGNOSIS — E782 Mixed hyperlipidemia: Secondary | ICD-10-CM

## 2021-09-02 DIAGNOSIS — E1169 Type 2 diabetes mellitus with other specified complication: Secondary | ICD-10-CM

## 2021-09-02 DIAGNOSIS — D696 Thrombocytopenia, unspecified: Secondary | ICD-10-CM

## 2021-09-02 DIAGNOSIS — K900 Celiac disease: Secondary | ICD-10-CM

## 2021-09-02 DIAGNOSIS — F4321 Adjustment disorder with depressed mood: Secondary | ICD-10-CM

## 2021-09-02 DIAGNOSIS — E559 Vitamin D deficiency, unspecified: Secondary | ICD-10-CM

## 2021-09-02 DIAGNOSIS — R972 Elevated prostate specific antigen [PSA]: Secondary | ICD-10-CM

## 2021-09-02 DIAGNOSIS — D500 Iron deficiency anemia secondary to blood loss (chronic): Secondary | ICD-10-CM

## 2021-09-02 NOTE — Assessment & Plan Note (Signed)
BP Readings from Last 1 Encounters:  09/02/21 129/66   Well-controlled with Benazepril-HCTZ 10-12.5 mg QD Counseled for compliance with the medications Advised DASH diet and moderate exercise/walking, at least 150 mins/week

## 2021-09-02 NOTE — Patient Instructions (Signed)
Please continue to take medications as prescribed.  Please continue to follow gluten free diet and low carb diet.  Please schedule appointment with your Ophthalmologist after your trip.  Please see Urology as scheduled for follow up after prostate biopsy.

## 2021-09-02 NOTE — Assessment & Plan Note (Signed)
Chronic, likely in the setting of liver cirrhosis Followed by Hematology Gets platelet transfusions before procedures

## 2021-09-02 NOTE — Assessment & Plan Note (Signed)
Followed by Hematology - gets Venofer

## 2021-09-02 NOTE — Assessment & Plan Note (Signed)
With HTN, liver cirrhosis and HLD  Lab Results  Component Value Date   HGBA1C 7.5 (H) 06/07/2020   On Levemir 36 U qHS and Humalog ISS, followed by Endocrinology in [place], [place] to follow diabetic diet On statin and ACEi F/u CMP and lipid panel Diabetic foot exam: Today Diabetic eye exam: Advised to follow up with Ophthalmology for diabetic eye exam

## 2021-09-02 NOTE — Assessment & Plan Note (Signed)
Symptomatic despite trying to follow gluten free diet Followed by GI

## 2021-09-02 NOTE — Assessment & Plan Note (Signed)
On statin Check lipid profile

## 2021-09-02 NOTE — Assessment & Plan Note (Signed)
Followed by [facility] and [facility] Has esophageal varices and pancytopenia Has had Hep A and B vaccines Liver enzymes stable overall

## 2021-09-02 NOTE — Assessment & Plan Note (Signed)
Followed by Urology Recently had prostate biopsy - has signs of prostate cancer, has an appointment with Urology to discuss treatment

## 2021-09-02 NOTE — Progress Notes (Signed)
New Patient Office Visit  Subjective:  Patient ID: Michael Harmon, male    DOB: Feb 18, 1961  Age: 60 y.o. MRN: [id number]  CC:  Chief Complaint  Patient presents with   New Patient (Initial Visit)    Establish care     HPI Michael Harmon is a 60 year old male with PMH of HTN, hepatic cirrhosis, portal hypertension, esophageal varices, celiac disease, type II DM, HLD, thrombocytopenia and IDA who presents for establishing care.  Type 2 DM: He takes Levemir 36 units nightly and Humalog ISS, and follows up with Endocrinology.  He did not tolerate metformin in the past, had diarrhea with that.  He attributes his celiac disease symptom onset to metformin.  Denies any polyuria or polydipsia currently.  He takes Lipitor for HLD.  He is also on ACEi for renal protection.  HTN: BP is well-controlled. Takes medications regularly. Patient denies headache, dizziness, chest pain, dyspnea or palpitations.  Celiac disease, hepatic cirrhosis: He has history of chronic diarrhea despite following gluten-free diet.  He is followed by [facility] and [facility].  He has history of esophageal varices as well.  He used to get surveillance EGD locally, but has to get it at [facility] now due to his thrombocytopenia.  He denies any melena or hematochezia currently.  Thrombocytopenia and IDA: Followed by Hematology.  He gets Venofer for iron deficiency.  Elevated PSA: Followed by urology, he recently had prostate biopsy, which showed signs of prostate cancer.  He denies any dysuria or hematuria currently.  He has an appointment with Urology to discuss treatment.  He has been stressed due to recent diagnosis of prostate cancer and his chronic medical conditions. He also reports anhedonia, insomnia, decreased appetite and stress for taking care of his wife.  He received flu vaccine in the office today.  Past Medical History:  Diagnosis Date   Allergy    nose and sinus problems   Anemia    Asthma     Celiac disease    Cirrhosis, cryptogenic (HCC)    completed Hep A and B vaccines   DM (diabetes mellitus) (HCC)    GERD (gastroesophageal reflux disease)    Hyperlipidemia    diet controlled now with 100 pound weight loss   Hypertension    Iron deficiency anemia due to chronic blood loss 08/13/2021   Narrow angle glaucoma suspect of both eyes    Renal cyst 09/03/2017   right   Situational depression 07/06/2017   Splenomegaly    Thrombocytopenia ([place])    hematology, ?related to chol med (tricor) and glimeripide, just being monitored, above 100,000    Past Surgical History:  Procedure Laterality Date   BIOPSY  07/09/2016   Procedure: BIOPSY;  Surgeon: [name], MD;  [facility];  Service: Endoscopy;;  duodenal, gastric, esophaageal   BIOPSY  07/31/2021   Procedure: BIOPSY;  Surgeon: [name], MD;  [facility];  Service: Endoscopy;;   CLOSED MANIPULATION SHOULDER     COLONOSCOPY  11/2015   Dr. Posey Pronto: cecal bx neg for microscopic colitis. ascending colon polyp (adenomatous)   COLONOSCOPY WITH PROPOFOL N/A 07/31/2021   Procedure: COLONOSCOPY WITH PROPOFOL;  Surgeon: [name], MD;  [facility];  Service: Endoscopy;  Laterality: N/A;  7:30am   ESOPHAGEAL BANDING N/A 08/04/2017   Procedure: ESOPHAGEAL BANDING;  Surgeon: [name], MD;  [facility];  Service: Endoscopy;  Laterality: N/A;  esophageal varices banding  ESOPHAGEAL BANDING N/A 10/13/2017   Procedure: ESOPHAGEAL BANDING;  Surgeon: [name], MD;  [facility];  Service: Endoscopy;  Laterality: N/A;   ESOPHAGEAL BANDING N/A 04/09/2020   Procedure: ESOPHAGEAL BANDING;  Surgeon: [name], MD;  [facility];  Service: Endoscopy;  Laterality: N/A;   ESOPHAGOGASTRODUODENOSCOPY N/A 07/09/2016   Procedure: ESOPHAGOGASTRODUODENOSCOPY (EGD);  Surgeon: [name], MD;  [facility];  Service: Endoscopy;  Laterality: N/A;  730    ESOPHAGOGASTRODUODENOSCOPY N/A 08/04/2017   Procedure: ESOPHAGOGASTRODUODENOSCOPY (EGD);  Surgeon: [name], MD;  [facility];  Service: Endoscopy;  Laterality: N/A;  7:30am   ESOPHAGOGASTRODUODENOSCOPY N/A 10/13/2017   Dr. [name]: grade 2 esophageal varices status post banding, portal hypertensive gastropathy   ESOPHAGOGASTRODUODENOSCOPY  11/23/2019   [name]: Esophageal varices, 4 columns of grade 2-3 status post banding.  Varices more prominent than seen in December 2018.  Portal gastropathy.  Normal-appearing small bowel.   ESOPHAGOGASTRODUODENOSCOPY (EGD) WITH PROPOFOL N/A 04/09/2020   Dr. [name]: 4 columns of grade 2/grade 3 esophageal varices somewhat recalcitrant.  Status post esophageal band ligation today.  Portal gastropathy.  Normal-appearing small bowel.   ESOPHAGOGASTRODUODENOSCOPY (EGD) WITH PROPOFOL N/A 07/31/2021   Procedure: ESOPHAGOGASTRODUODENOSCOPY (EGD) WITH PROPOFOL;  Surgeon: [name], MD;  [facility];  Service: Endoscopy;  Laterality: N/A;    Family History  Problem Relation Age of Onset   Other Mother        chronic diarrhea   COPD Mother    Heart disease Mother        died at 74   Arthritis Mother    Cancer Mother    Depression Mother    Diabetes Mother    Hyperlipidemia Mother    Hypertension Mother    Stroke Mother    Arthritis Father    Asthma Father    Birth defects Father    Heart disease Father        aortic valve replaced   Heart disease Maternal Grandmother    Stroke Maternal Grandmother    Heart disease Maternal Grandfather    Stroke Maternal Grandfather    Diabetes Paternal Grandmother    Stroke Paternal Grandfather    Heart disease Paternal Grandfather    Liver disease Neg Hx    Colon cancer Neg Hx     Social History   Socioeconomic History   Marital status: Married    Spouse name: Melissa   Number of children: 0   Years of education: 14   Highest education level: Not on file  Occupational History    Occupation: Customer service manager, [employer], travels  Tobacco Use   Smoking status: Never   Smokeless tobacco: Never  Vaping Use   Vaping Use: Never used  Substance and Sexual Activity   Alcohol use: Yes    Alcohol/week: 0.0 standard drinks    Comment: rare, 1-2 drinks a month, on vacation   Drug use: No   Sexual activity: Not Currently  Other Topics Concern   Not on file  Social History Narrative   Lives at home with wife Melissa   One dog.   Has no children.   Eats all food groups.   Works for [employer] and [employer].       Social Determinants of Health   Financial Resource Strain: Not on file  Food Insecurity: Not on file  Transportation Needs: Not on file  Physical Activity: Not on file  Stress:  Not on file  Social Connections: Not on file  Intimate Partner Violence: Not on file    ROS Review of Systems  Constitutional:  Positive for fatigue. Negative for chills and fever.  HENT:  Negative for congestion and sore throat.   Eyes:  Negative for pain and discharge.  Respiratory:  Negative for cough and shortness of breath.   Cardiovascular:  Negative for chest pain and palpitations.  Gastrointestinal:  Positive for diarrhea. Negative for nausea and vomiting.  Endocrine: Negative for polydipsia and polyuria.  Genitourinary:  Negative for dysuria and hematuria.  Musculoskeletal:  Negative for neck pain and neck stiffness.  Skin:  Negative for rash.  Neurological:  Negative for dizziness, weakness, numbness and headaches.  Psychiatric/Behavioral:  Positive for dysphoric mood and sleep disturbance. Negative for agitation and behavioral problems.    Objective:   Today's Vitals: BP 129/66   Pulse 79   Temp 98.4 F (36.9 C) (Oral)   Ht 5' 7"  (1.702 m)   Wt 218 lb (98.9 kg)   SpO2 97%   BMI 34.14 kg/m   Physical Exam Vitals reviewed.  Constitutional:      General: He is not in acute distress.    Appearance: He is not diaphoretic.   HENT:     Head: Normocephalic and atraumatic.     Nose: Nose normal.     Mouth/Throat:     Mouth: Mucous membranes are moist.  Eyes:     General: No scleral icterus.    Extraocular Movements: Extraocular movements intact.  Cardiovascular:     Rate and Rhythm: Normal rate and regular rhythm.     Pulses: Normal pulses.     Heart sounds: Normal heart sounds. No murmur heard. Pulmonary:     Breath sounds: Normal breath sounds. No wheezing or rales.  Abdominal:     Palpations: Abdomen is soft.     Tenderness: There is no abdominal tenderness.  Musculoskeletal:     Cervical back: Neck supple. No tenderness.     Right lower leg: No edema.     Left lower leg: No edema.  Skin:    General: Skin is warm.     Findings: No rash.  Neurological:     General: No focal deficit present.     Mental Status: He is alert and oriented to person, place, and time.     Sensory: No sensory deficit.     Motor: No weakness.  Psychiatric:        Mood and Affect: Mood normal.        Behavior: Behavior normal.    Assessment & Plan:   Problem List Items Addressed This Visit       Cardiovascular and Mediastinum   Essential hypertension    BP Readings from Last 1 Encounters:  09/02/21 129/66  Well-controlled with Benazepril-HCTZ 10-12.5 mg QD Counseled for compliance with the medications Advised DASH diet and moderate exercise/walking, at least 150 mins/week         Digestive   Adult form of celiac disease    Symptomatic despite trying to follow gluten free diet Followed by GI        Hepatic cirrhosis (HCC)    Followed by [facility] and [facility] Has esophageal varices and pancytopenia Has had Hep A and B vaccines Liver enzymes stable overall        Endocrine   Type 2 diabetes mellitus with other specified complication ([place]) - Primary    With HTN, liver cirrhosis and HLD  Lab Results  Component Value Date   HGBA1C 7.5 (H) 06/07/2020   On Levemir 36 U qHS and Humalog ISS,  followed by Endocrinology in [place], [place] to follow diabetic diet On statin and ACEi F/u CMP and lipid panel Diabetic foot exam: Today Diabetic eye exam: Advised to follow up with Ophthalmology for diabetic eye exam      Relevant Orders   HgB A1c     Hematopoietic and Hemostatic   Thrombocytopenia (HCC)    Chronic, likely in the setting of liver cirrhosis Followed by Hematology Gets platelet transfusions before procedures      Relevant Orders   CBC with Differential     Other   Vitamin D deficiency   Relevant Orders   Vitamin D (25 hydroxy)   HLD (hyperlipidemia)    On statin Check lipid profile      Relevant Orders   Lipid panel   Situational depression    Appears to be stressed due to his recent diagnosis of prostate cancer and chronic medical conditions Has caregiver stress due to his wife being dependent on him Does not want any antidepressant for now      Iron deficiency anemia due to chronic blood loss    Followed by Hematology - gets Venofer      Elevated PSA    Followed by Urology Recently had prostate biopsy - has signs of prostate cancer, has an appointment with Urology to discuss treatment      Other Visit Diagnoses     Need for immunization against influenza       Relevant Orders   Flu Vaccine QUAD 66moIM (Fluarix, Fluzone & Alfiuria Quad PF) (Completed)       Outpatient Encounter Medications as of 09/02/2021  Medication Sig   acetaminophen (TYLENOL) 325 MG tablet Take 650 mg by mouth every 6 (six) hours as needed (for pain.).   atorvastatin (LIPITOR) 20 MG tablet Take 1 tablet (20 mg total) by mouth daily. (Patient taking differently: Take 20 mg by mouth in the morning.)   BD PEN NEEDLE NANO 2ND GEN 32G X 4 MM MISC USE TO INJECT INSULIN 4 TIMES DAILY E11.65   benazepril-hydrochlorthiazide (LOTENSIN HCT) 10-12.5 MG tablet Take 1 tablet by mouth in the morning.   Ca Carbonate-Mag Hydroxide (ROLAIDS PO) Take 1 tablet by mouth 4  (four) times daily as needed (heartburn).   Cholecalciferol (VITAMIN D3) 125 MCG (5000 UT) TABS Take 5,000 Units by mouth in the morning.   Continuous Blood Gluc Sensor (FREESTYLE LIBRE 2 SENSOR) MISC USE AS DIRECTED FOR BLOOD SUGAR MONITORING. CHANGE EVERY 14 DAYS   HUMALOG KWIKPEN 100 UNIT/ML KwikPen Inject 10-16 Units into the skin with breakfast, with lunch, and with evening meal.   LEVEMIR FLEXTOUCH 100 UNIT/ML Pen Inject 36 Units into the skin at bedtime.    LORazepam (ATIVAN) 0.5 MG tablet Take 0.5 mg by mouth 2 (two) times daily.   Multiple Vitamin (MULTIVITAMIN WITH MINERALS) TABS tablet Take 1 tablet by mouth in the morning.   promethazine-dextromethorphan (PROMETHAZINE-DM) 6.25-15 MG/5ML syrup promethazine-DM 6.25 mg-15 mg/5 mL oral syrup  TAKE 5 ML (ORAL) EVERY 6 HOURS AS NEEDED FOR COUGH (Patient not taking: Reported on 09/02/2021)   [DISCONTINUED] levofloxacin (LEVAQUIN) 500 MG tablet levofloxacin 500 mg tablet  TAKE 1 TABLET (ORAL) 1 TIME PER DAY FOR 10 DAYS FOR INFECTION (Patient not taking: Reported on 09/02/2021)   No facility-administered encounter medications on file as of 09/02/2021.    Follow-up: Return in about  6 months (around 03/02/2022).   Lindell Spar, MD

## 2021-09-02 NOTE — Assessment & Plan Note (Signed)
Appears to be stressed due to his recent diagnosis of prostate cancer and chronic medical conditions Has caregiver stress due to his wife being dependent on him Does not want any antidepressant for now

## 2021-09-03 ENCOUNTER — Inpatient Hospital Stay (HOSPITAL_COMMUNITY): Payer: BC Managed Care – PPO | Attending: Hematology and Oncology

## 2021-09-03 VITALS — BP 118/54 | HR 66 | Temp 97.8°F | Resp 20

## 2021-09-03 DIAGNOSIS — D509 Iron deficiency anemia, unspecified: Secondary | ICD-10-CM | POA: Diagnosis present

## 2021-09-03 DIAGNOSIS — D500 Iron deficiency anemia secondary to blood loss (chronic): Secondary | ICD-10-CM

## 2021-09-03 MED ORDER — SODIUM CHLORIDE 0.9 % IV SOLN
300.0000 mg | Freq: Once | INTRAVENOUS | Status: AC
  Administered 2021-09-03: 300 mg via INTRAVENOUS
  Filled 2021-09-03: qty 300

## 2021-09-03 MED ORDER — LORATADINE 10 MG PO TABS
10.0000 mg | ORAL_TABLET | Freq: Once | ORAL | Status: AC
  Administered 2021-09-03: 10 mg via ORAL
  Filled 2021-09-03: qty 1

## 2021-09-03 MED ORDER — SODIUM CHLORIDE 0.9 % IV SOLN: Freq: Once | INTRAVENOUS | Status: AC

## 2021-09-03 NOTE — Patient Instructions (Signed)
Michael Harmon  Discharge Instructions: Thank you for choosing [facility] to provide your oncology and hematology care.  If you have a lab appointment with the [facility], please come in thru the Main Entrance and check in at the main information desk.  Wear comfortable clothing and clothing appropriate for easy access to any Portacath or PICC line.   We strive to give you quality time with your provider. You may need to reschedule your appointment if you arrive late (15 or more minutes).  Arriving late affects you and other patients whose appointments are after yours.  Also, if you miss three or more appointments without notifying the office, you may be dismissed from the clinic at the provider's discretion.      For prescription refill requests, have your pharmacy contact our office and allow 72 hours for refills to be completed.    Today you received Venofer IV iron infusion.     BELOW ARE SYMPTOMS THAT SHOULD BE REPORTED IMMEDIATELY: *FEVER GREATER THAN 100.4 F (38 C) OR HIGHER *CHILLS OR SWEATING *NAUSEA AND VOMITING THAT IS NOT CONTROLLED WITH YOUR NAUSEA MEDICATION *UNUSUAL SHORTNESS OF BREATH *UNUSUAL BRUISING OR BLEEDING *URINARY PROBLEMS (pain or burning when urinating, or frequent urination) *BOWEL PROBLEMS (unusual diarrhea, constipation, pain near the anus) TENDERNESS IN MOUTH AND THROAT WITH OR WITHOUT PRESENCE OF ULCERS (sore throat, sores in mouth, or a toothache) UNUSUAL RASH, SWELLING OR PAIN  UNUSUAL VAGINAL DISCHARGE OR ITCHING   Items with * indicate a potential emergency and should be followed up as soon as possible or go to the Emergency Department if any problems should occur.  Please show the CHEMOTHERAPY ALERT CARD or IMMUNOTHERAPY ALERT CARD at check-in to the Emergency Department and triage nurse.  Should you have questions after your visit or need to cancel or reschedule your appointment, please contact [facility]  [phone]  and follow the prompts.  Office hours are 8:00 a.m. to 4:30 p.m. Monday - Friday. Please note that voicemails left after 4:00 p.m. may not be returned until the following business day.  We are closed weekends and major holidays. You have access to a nurse at all times for urgent questions. Please call the main number to the clinic [phone] and follow the prompts.  For any non-urgent questions, you may also contact your provider using MyChart. We now offer e-Visits for anyone 65 and older to request care online for non-urgent symptoms. For details visit [email or web address].   Also download the MyChart app! Go to the app store, search "MyChart", open the app, select [facility], and log in with your MyChart username and password.  Due to Covid, a mask is required upon entering the hospital/clinic. If you do not have a mask, one will be given to you upon arrival. For doctor visits, patients may have 1 support person aged 85 or older with them. For treatment visits, patients cannot have anyone with them due to current Covid guidelines and our immunocompromised population.

## 2021-09-03 NOTE — Progress Notes (Signed)
Pt presents today for Venofer IV iron infusion per provider's. Vitals signs stable signs stable and pt voiced no new complaints at this time.   Peripheral IV started with good blood return pre and post infusion.  Treatment given today per MD orders. Tolerated infusion without adverse affects. Vital signs stable. No complaints at this time. Discharged from clinic ambulatory in stable condition. Alert and oriented x 3. F/U with [facility] as scheduled.

## 2021-09-04 LAB — CBC WITH DIFFERENTIAL/PLATELET
Basophils Absolute: 0 10*3/uL (ref 0.0–0.2)
Basos: 1 %
EOS (ABSOLUTE): 0.3 10*3/uL (ref 0.0–0.4)
Eos: 7 %
Hematocrit: 32.9 % — ABNORMAL LOW (ref 37.5–51.0)
Hemoglobin: 11.1 g/dL — ABNORMAL LOW (ref 13.0–17.7)
Immature Grans (Abs): 0 10*3/uL (ref 0.0–0.1)
Immature Granulocytes: 0 %
Lymphocytes Absolute: 1.2 10*3/uL (ref 0.7–3.1)
Lymphs: 30 %
MCH: 32.4 pg (ref 26.6–33.0)
MCHC: 33.7 g/dL (ref 31.5–35.7)
MCV: 96 fL (ref 79–97)
Monocytes Absolute: 0.4 10*3/uL (ref 0.1–0.9)
Monocytes: 9 %
Neutrophils Absolute: 2.1 10*3/uL (ref 1.4–7.0)
Neutrophils: 53 %
Platelets: 43 10*3/uL — CL (ref 150–450)
RBC: 3.43 x10E6/uL — ABNORMAL LOW (ref 4.14–5.80)
RDW: 14 % (ref 11.6–15.4)
WBC: 3.9 10*3/uL (ref 3.4–10.8)

## 2021-09-04 LAB — HEMOGLOBIN A1C
Est. average glucose Bld gHb Est-mCnc: 108 mg/dL
Hgb A1c MFr Bld: 5.4 % (ref 4.8–5.6)

## 2021-09-04 LAB — LIPID PANEL
Chol/HDL Ratio: 2 ratio (ref 0.0–5.0)
Cholesterol, Total: 108 mg/dL (ref 100–199)
HDL: 53 mg/dL (ref >39)
LDL Chol Calc (NIH): 38 mg/dL (ref 0–99)
Triglycerides: 88 mg/dL (ref 0–149)
VLDL Cholesterol Cal: 17 mg/dL (ref 5–40)

## 2021-09-04 LAB — VITAMIN D 25 HYDROXY (VIT D DEFICIENCY, FRACTURES): Vit D, 25-Hydroxy: 26.4 ng/mL — ABNORMAL LOW (ref 30.0–100.0)

## 2021-09-08 ENCOUNTER — Ambulatory Visit: Payer: BC Managed Care – PPO | Admitting: Urology

## 2021-09-22 ENCOUNTER — Ambulatory Visit: Payer: BC Managed Care – PPO | Admitting: Urology

## 2021-09-22 ENCOUNTER — Encounter: Payer: Self-pay | Admitting: Urology

## 2021-09-22 ENCOUNTER — Other Ambulatory Visit: Payer: Self-pay

## 2021-09-22 DIAGNOSIS — C61 Malignant neoplasm of prostate: Secondary | ICD-10-CM | POA: Diagnosis not present

## 2021-09-22 NOTE — Progress Notes (Signed)
09/22/2021 3:38 PM   [place] Dec 30, 1960 [id number]  Referring provider: No referring provider defined for this encounter.  No chief complaint on file.   HPI:  Follow-up-  1) prostate cancer-patient diagnosed with favorable intermediate risk prostate cancer October 2022.  Here to review biopsy results and discuss management plan. His dad had PCa.   Biopsy: October 2022 PSA 8.2 (PSA density 0.13) T1c Prostate 63 g Gleason 3+4=7, 1 core, 30% (Gleason 4 component 40%) Gleason 3+3=6, 1 core, 10%  Has NASH and cirrhosis - thrombocytopenia (platelets 52 k) and anemia.  His platelets were 43,000 prior to the biopsy and he had to undergo platelet transfusion prior to biopsy. His MELD score is 7 - per pt. He gets a c-scope once a year for banding.    2) BPH, urgency - AUASS =12. Took tamsulosin in the past. He has urgency and UUI. He recalls a prior PSA of "3-4" and took something to lower it. No constipation. No NG risk.  PVR 86 ml. UA clear.     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       PMH: Past Medical History:  Diagnosis Date   Allergy    nose and sinus problems   Anemia    Asthma    Celiac disease    Cirrhosis, cryptogenic (HCC)    completed Hep A and B vaccines   DM (diabetes mellitus) (HCC)    GERD (gastroesophageal reflux disease)    Hyperlipidemia    diet controlled now with 100 pound weight loss   Hypertension    Iron deficiency anemia due to chronic blood loss 08/13/2021   Narrow angle glaucoma suspect of both eyes    Renal cyst 09/03/2017   right   Situational depression 07/06/2017   Splenomegaly    Thrombocytopenia ([place])    hematology, ?related to chol med (tricor) and glimeripide, just being monitored, above 100,000    Surgical  History: Past Surgical History:  Procedure Laterality Date   BIOPSY  07/09/2016   Procedure: BIOPSY;  Surgeon: [name], MD;  [facility];  Service: Endoscopy;;  duodenal, gastric, esophaageal   BIOPSY  07/31/2021   Procedure: BIOPSY;  Surgeon: [name], MD;  [facility];  Service: Endoscopy;;   CLOSED MANIPULATION SHOULDER     COLONOSCOPY  11/2015   Dr. Posey Pronto: cecal bx neg for microscopic colitis. ascending colon polyp (adenomatous)   COLONOSCOPY WITH PROPOFOL N/A 07/31/2021   Procedure: COLONOSCOPY WITH PROPOFOL;  Surgeon: [name], MD;  [facility];  Service: Endoscopy;  Laterality: N/A;  7:30am   ESOPHAGEAL BANDING N/A 08/04/2017   Procedure: ESOPHAGEAL BANDING;  Surgeon: [name], MD;  [facility];  Service: Endoscopy;  Laterality: N/A;  esophageal varices banding   ESOPHAGEAL BANDING N/A 10/13/2017   Procedure: ESOPHAGEAL BANDING;  Surgeon: [name], MD;  [facility];  Service: Endoscopy;  Laterality: N/A;   ESOPHAGEAL BANDING N/A 04/09/2020   Procedure: ESOPHAGEAL BANDING;  Surgeon: [name], MD;  [facility];  Service: Endoscopy;  Laterality: N/A;   ESOPHAGOGASTRODUODENOSCOPY N/A 07/09/2016   Procedure: ESOPHAGOGASTRODUODENOSCOPY (EGD);  Surgeon: [name], MD;  [facility];  Service: Endoscopy;  Laterality: N/A;  730   ESOPHAGOGASTRODUODENOSCOPY N/A 08/04/2017   Procedure: ESOPHAGOGASTRODUODENOSCOPY (EGD);  Surgeon: [name], MD;  [facility];  Service: Endoscopy;  Laterality: N/A;  7:30am   ESOPHAGOGASTRODUODENOSCOPY N/A 10/13/2017   Dr. [name]: grade 2 esophageal varices status post banding, portal hypertensive gastropathy   ESOPHAGOGASTRODUODENOSCOPY  11/23/2019   [name]: Esophageal varices, 4 columns of grade 2-3 status post banding.  Varices more prominent than seen in December 2018.  Portal gastropathy.  Normal-appearing small bowel.    ESOPHAGOGASTRODUODENOSCOPY (EGD) WITH PROPOFOL N/A 04/09/2020   Dr. [name]: 4 columns of grade 2/grade 3 esophageal varices somewhat recalcitrant.  Status post esophageal band ligation today.  Portal gastropathy.  Normal-appearing small bowel.   ESOPHAGOGASTRODUODENOSCOPY (EGD) WITH PROPOFOL N/A 07/31/2021   Procedure: ESOPHAGOGASTRODUODENOSCOPY (EGD) WITH PROPOFOL;  Surgeon: [name], MD;  [facility];  Service: Endoscopy;  Laterality: N/A;    Home Medications:  Allergies as of 09/22/2021       Reactions   Penicillins Other (See Comments)   As a baby, unknown reaction Did it involve swelling of the face/tongue/throat, SOB, or low BP? Unknown Did it involve sudden or severe rash/hives, skin peeling, or any reaction on the inside of your mouth or nose? Unknown Did you need to seek medical attention at a hospital or doctor's office? Unknown When did it last happen?      infant allergy If all above answers are "NO", may proceed with cephalosporin use. .   Gluten Meal Diarrhea   Biaxin [clarithromycin] Tinitus   Ears ringing, loss sense of smell   Prednisone Hives   Made blood sugar high        Medication List        Accurate as of September 22, 2021  3:38 PM. If you have any questions, ask your nurse or doctor.          acetaminophen 325 MG tablet Commonly known as: TYLENOL Take 650 mg by mouth every 6 (six) hours as needed (for pain.).   atorvastatin 20 MG tablet Commonly known as: LIPITOR Take 1 tablet (20 mg total) by mouth daily. What changed: when to take this   BD Pen Needle Nano 2nd Gen 32G X 4 MM Misc Generic drug: Insulin Pen Needle USE TO INJECT INSULIN 4 TIMES DAILY E11.65   benazepril-hydrochlorthiazide 10-12.5 MG tablet Commonly known as: LOTENSIN HCT Take 1 tablet by mouth in the morning.   FreeStyle Libre 2 Sensor Misc USE AS DIRECTED FOR BLOOD SUGAR MONITORING. CHANGE EVERY 14 DAYS   HumaLOG KwikPen 100 UNIT/ML KwikPen Generic drug:  insulin lispro Inject 10-16 Units into the skin with breakfast, with lunch, and with evening meal.   Levemir FlexTouch 100 UNIT/ML FlexPen Generic drug: insulin detemir Inject 36 Units into the skin at bedtime.   LORazepam 0.5 MG tablet Commonly known as: ATIVAN Take 0.5 mg by mouth 2 (two) times daily.   multivitamin with minerals Tabs tablet Take 1 tablet by mouth in the morning.   promethazine-dextromethorphan 6.25-15 MG/5ML syrup Commonly known as: PROMETHAZINE-DM   ROLAIDS PO Take 1 tablet by mouth 4 (four) times daily as needed (heartburn).   Vitamin D3 125 MCG (5000 UT) Tabs Take 5,000 Units by mouth in the morning.        Allergies:  Allergies  Allergen Reactions   Penicillins Other (See Comments)    As a baby, unknown reaction Did it involve swelling of the face/tongue/throat, SOB, or low BP? Unknown Did it involve sudden or  severe rash/hives, skin peeling, or any reaction on the inside of your mouth or nose? Unknown Did you need to seek medical attention at a hospital or doctor's office? Unknown When did it last happen?      infant allergy If all above answers are "NO", may proceed with cephalosporin use. .    Gluten Meal Diarrhea   Biaxin [Clarithromycin] Tinitus    Ears ringing, loss sense of smell   Prednisone Hives    Made blood sugar high    Family History: Family History  Problem Relation Age of Onset   Other Mother        chronic diarrhea   COPD Mother    Heart disease Mother        died at 71   Arthritis Mother    Cancer Mother    Depression Mother    Diabetes Mother    Hyperlipidemia Mother    Hypertension Mother    Stroke Mother    Arthritis Father    Asthma Father    Birth defects Father    Heart disease Father        aortic valve replaced   Heart disease Maternal Grandmother    Stroke Maternal Grandmother    Heart disease Maternal Grandfather    Stroke Maternal Grandfather    Diabetes Paternal Grandmother    Stroke Paternal  Grandfather    Heart disease Paternal Grandfather    Liver disease Neg Hx    Colon cancer Neg Hx     Social History:  reports that he has never smoked. He has never used smokeless tobacco. He reports current alcohol use. He reports that he does not use drugs.   Physical Exam: There were no vitals taken for this visit.  Constitutional:  Alert and oriented, No acute distress. HEENT: [place] AT, moist mucus membranes.  Trachea midline, no masses. Cardiovascular: No clubbing, cyanosis, or edema. Respiratory: Normal respiratory effort, no increased work of breathing. GI: Abdomen is soft, nontender, nondistended, no abdominal masses Skin: No rashes, bruises or suspicious lesions. Neurologic: Grossly intact, no focal deficits, moving all 4 extremities. Psychiatric: Normal mood and affect.  Laboratory Data: Lab Results  Component Value Date   WBC 3.9 09/02/2021   HGB 11.1 (L) 09/02/2021   HCT 32.9 (L) 09/02/2021   MCV 96 09/02/2021   PLT 43 (LL) 09/02/2021    Lab Results  Component Value Date   CREATININE 1.04 08/13/2021    No results found for: PSA  No results found for: TESTOSTERONE  Lab Results  Component Value Date   HGBA1C 5.4 09/02/2021    Urinalysis    Component Value Date/Time   COLORURINE YELLOW 06/07/2020 2150   APPEARANCEUR Clear 05/19/2021 1151   LABSPEC 1.012 06/07/2020 2150   PHURINE 5.0 06/07/2020 2150   GLUCOSEU Negative 05/19/2021 1151   HGBUR NEGATIVE 06/07/2020 2150   BILIRUBINUR Negative 05/19/2021 [place] 06/07/2020 2150   PROTEINUR Negative 05/19/2021 [place] 06/07/2020 2150   NITRITE Negative 05/19/2021 1151   NITRITE NEGATIVE 06/07/2020 2150   LEUKOCYTESUR Negative 05/19/2021 1151   LEUKOCYTESUR NEGATIVE 06/07/2020 2150    Lab Results  Component Value Date   LABMICR See below: 05/19/2021   WBCUA None seen 05/19/2021   LABEPIT None seen 05/19/2021   BACTERIA None seen 05/19/2021    Pertinent  Imaging:   Assessment & Plan:    Prostate cancer - I had a long discussion with the patient using the prostate cancer handout  and his path report.  We went over his stage, grade and prognosis and the relevant anatomy. We discussed the nature risks and benefits of active surveillance, radical prostatectomy, IMRT (+/- brachytherapy, +/- ADT). We discussed specifically how each treatment might affect the bowel, bladder and sexual function. We discussed how each treatment might effect salvage treatments. We discussed the role of other modalities in the treatment of prostate cancer including chemotherapy, HIFU and cryotherapy - focal or whole gland.    His situation is complicated by thrombocytopenia.  He required a platelet transfusion prior to prostate biopsy.  He is leaning toward radiation and going ahead and getting treated. A period of AS with follow-up MRI is a reasonable option. He is not keen on active surveillance. His GI is now at [facility] and I offered to refer to [facility]. He would like to see Dr. Tammi Klippel in [place].  We could give him a platelet transfusion prior to [facility] and gold seeds here in [place] prior to EBXRT.  Another option would be brachytherapy and he could be seen in preop early that day for possible platelet transfusion with brachytherapy that afternoon.   No follow-ups on file.  [name], MD  [facility]  [address] [place], [place] [address] [phone]

## 2021-09-23 ENCOUNTER — Telehealth: Payer: Self-pay | Admitting: Radiation Oncology

## 2021-09-23 NOTE — Telephone Encounter (Signed)
Called pt to schedule consultation w. Dr. Tammi Klippel. No answer, LVM for pt to return call.

## 2021-09-24 ENCOUNTER — Telehealth: Payer: Self-pay | Admitting: Radiation Oncology

## 2021-09-24 NOTE — Telephone Encounter (Signed)
Called pt to schedule consultation w. Dr. Tammi Klippel. No answer, LVM for pt to return call.

## 2021-10-01 NOTE — Progress Notes (Signed)
GU Location of Tumor / Histology: Prostate Ca  If Prostate Cancer, Gleason Score is (4 + 3) and PSA is (8.2 as of 10/22)    Past/Anticipated interventions by urology, if any:   Dr. Junious Silk  We went over his stage, grade and prognosis and the relevant anatomy. We discussed the nature risks and benefits of active surveillance, radical prostatectomy, IMRT (+/- brachytherapy, +/- ADT). We discussed specifically how each treatment might affect the bowel, bladder and sexual function. We discussed how each treatment might effect salvage treatments. We discussed the role of other modalities in the treatment of prostate cancer including chemotherapy, HIFU and cryotherapy - focal or whole gland.   His situation is complicated by thrombocytopenia.  He required a platelet transfusion prior to prostate biopsy.  He is leaning toward radiation and going ahead and getting treated. A period of AS with follow-up MRI is a reasonable option. He is not keen on active surveillance. His GI is now at [facility] and I offered to refer to [facility]. He would like to see Dr. Tammi Klippel in [place].  We could give him a platelet transfusion prior to [facility] and gold seeds here in [place] prior to EBXRT.  Another option would be brachytherapy and he could be seen in preop early that day for possible platelet transfusion with brachytherapy that afternoon.   Past/Anticipated interventions by medical oncology, if any:   Weight changes, if any: Lost 15-20 lbs over last six weeks.  Patient states could be Ceilac disease.  IPSS:  10 SHIM:  6  Bowel/Bladder complaints, if any:  Yes, due other medical issues.  Nausea/Vomiting, if any:  No  Pain issues, if any:  0/10  SAFETY ISSUES: Prior radiation?   No Pacemaker/ICD?  No Possible current pregnancy?  Male Is the patient on methotrexate?  No  Current Complaints / other details: Needs more information on treatment.

## 2021-10-03 ENCOUNTER — Ambulatory Visit
Admission: RE | Admit: 2021-10-03 | Discharge: 2021-10-03 | Disposition: A | Discharge status: Home / Self Care | Payer: BC Managed Care – PPO | Source: Ambulatory Visit | Attending: Radiation Oncology | Admitting: Radiation Oncology

## 2021-10-03 ENCOUNTER — Other Ambulatory Visit: Payer: Self-pay

## 2021-10-03 ENCOUNTER — Encounter: Payer: Self-pay | Admitting: Radiation Oncology

## 2021-10-03 VITALS — BP 128/54 | HR 74 | Temp 97.9°F | Resp 20 | Ht 67.0 in | Wt 216.6 lb

## 2021-10-03 DIAGNOSIS — K219 Gastro-esophageal reflux disease without esophagitis: Secondary | ICD-10-CM | POA: Insufficient documentation

## 2021-10-03 DIAGNOSIS — D696 Thrombocytopenia, unspecified: Secondary | ICD-10-CM | POA: Insufficient documentation

## 2021-10-03 DIAGNOSIS — Z79899 Other long term (current) drug therapy: Secondary | ICD-10-CM | POA: Diagnosis not present

## 2021-10-03 DIAGNOSIS — I10 Essential (primary) hypertension: Secondary | ICD-10-CM | POA: Diagnosis not present

## 2021-10-03 DIAGNOSIS — C61 Malignant neoplasm of prostate: Secondary | ICD-10-CM | POA: Insufficient documentation

## 2021-10-03 DIAGNOSIS — E785 Hyperlipidemia, unspecified: Secondary | ICD-10-CM | POA: Diagnosis not present

## 2021-10-03 DIAGNOSIS — K900 Celiac disease: Secondary | ICD-10-CM | POA: Insufficient documentation

## 2021-10-03 DIAGNOSIS — R161 Splenomegaly, not elsewhere classified: Secondary | ICD-10-CM | POA: Insufficient documentation

## 2021-10-03 DIAGNOSIS — K7460 Unspecified cirrhosis of liver: Secondary | ICD-10-CM | POA: Diagnosis not present

## 2021-10-03 DIAGNOSIS — E119 Type 2 diabetes mellitus without complications: Secondary | ICD-10-CM | POA: Insufficient documentation

## 2021-10-03 HISTORY — DX: Malignant neoplasm of prostate: C61

## 2021-10-03 NOTE — Progress Notes (Signed)
Radiation Oncology         [phone] ________________________________  Initial Outpatient Consultation  Name: Michael Harmon MRN: [id number]  Date: 10/03/2021  DOB: [birth year]  RS:WNIOE, Colin Broach, MD  [name], MD   REFERRING PHYSICIAN: [name], MD  DIAGNOSIS: 60 y.o. gentleman with Stage T1c adenocarcinoma of the prostate with Gleason score of 3+4, and PSA of 8.2.    ICD-10-CM   1. Malignant neoplasm of prostate ([place])  C61       HISTORY OF PRESENT ILLNESS: Michael Harmon is a 60 y.o. male with a diagnosis of prostate cancer.  He was initially referred to Dr. Junious Silk for a scrotal angiokeratoma and BPH with urgency on 05/19/21, digital rectal examination was performed at that time showing no prostate nodules. He reported significant LUTS, mostly urgency and frequency, but did not want to take medications to manage them as he feels he tolerates it fairly well. A PSA performed that day was elevated at 7.4, and repeat on 06/23/21 showed persistent elevation at 8.2. His biopsy was delayed secondary to colonoscopy/EGD on 07/31/21. The patient proceeded to transrectal ultrasound with 12 biopsies of the prostate on 08/18/21.  The prostate volume measured 62.7 cc.  Out of 12 core biopsies, 2 were positive.  The maximum Gleason score was 3+4, and this was seen in the left base latera. Additionally, Gleason 3+3 was seen in the right mid lateral.  The patient reviewed the biopsy results with his urologist and he has kindly been referred today for discussion of potential radiation treatment options.  Of note, he has a history of thrombocytopenia and is followed by Dr. Chryl Heck. He also has a history of Celiac disease which causes urgent, diarrhea that at times results in bowel incontinence.  PREVIOUS RADIATION THERAPY: No  PAST MEDICAL HISTORY:  Past Medical History:  Diagnosis Date   Allergy    nose and sinus problems   Anemia    Asthma    Celiac disease    Cirrhosis,  cryptogenic (HCC)    completed Hep A and B vaccines   DM (diabetes mellitus) (HCC)    GERD (gastroesophageal reflux disease)    Hyperlipidemia    diet controlled now with 100 pound weight loss   Hypertension    Iron deficiency anemia due to chronic blood loss 08/13/2021   Malignant neoplasm of prostate ([place]) 10/03/2021   Narrow angle glaucoma suspect of both eyes    Renal cyst 09/03/2017   right   Situational depression 07/06/2017   Splenomegaly    Thrombocytopenia ([place])    hematology, ?related to chol med (tricor) and glimeripide, just being monitored, above 100,000      PAST SURGICAL HISTORY: Past Surgical History:  Procedure Laterality Date   BIOPSY  07/09/2016   Procedure: BIOPSY;  Surgeon: [name], MD;  [facility];  Service: Endoscopy;;  duodenal, gastric, esophaageal   BIOPSY  07/31/2021   Procedure: BIOPSY;  Surgeon: [name], MD;  [facility];  Service: Endoscopy;;   CLOSED MANIPULATION SHOULDER     COLONOSCOPY  11/2015   Dr. Posey Pronto: cecal bx neg for microscopic colitis. ascending colon polyp (adenomatous)   COLONOSCOPY WITH PROPOFOL N/A 07/31/2021   Procedure: COLONOSCOPY WITH PROPOFOL;  Surgeon: [name], MD;  [facility];  Service: Endoscopy;  Laterality: N/A;  7:30am   ESOPHAGEAL BANDING N/A 08/04/2017   Procedure: ESOPHAGEAL BANDING;  Surgeon: [name], MD;  [facility];  Service: Endoscopy;  Laterality: N/A;  esophageal varices banding   ESOPHAGEAL BANDING N/A 10/13/2017   Procedure: ESOPHAGEAL BANDING;  Surgeon: [name], MD;  [facility];  Service: Endoscopy;  Laterality: N/A;   ESOPHAGEAL BANDING N/A 04/09/2020   Procedure: ESOPHAGEAL BANDING;  Surgeon: [name], MD;  [facility];  Service: Endoscopy;  Laterality: N/A;   ESOPHAGOGASTRODUODENOSCOPY N/A 07/09/2016   Procedure: ESOPHAGOGASTRODUODENOSCOPY (EGD);  Surgeon: [name], MD;  [facility];   Service: Endoscopy;  Laterality: N/A;  730   ESOPHAGOGASTRODUODENOSCOPY N/A 08/04/2017   Procedure: ESOPHAGOGASTRODUODENOSCOPY (EGD);  Surgeon: [name], MD;  [facility];  Service: Endoscopy;  Laterality: N/A;  7:30am   ESOPHAGOGASTRODUODENOSCOPY N/A 10/13/2017   Dr. [name]: grade 2 esophageal varices status post banding, portal hypertensive gastropathy   ESOPHAGOGASTRODUODENOSCOPY  11/23/2019   [name]: Esophageal varices, 4 columns of grade 2-3 status post banding.  Varices more prominent than seen in December 2018.  Portal gastropathy.  Normal-appearing small bowel.   ESOPHAGOGASTRODUODENOSCOPY (EGD) WITH PROPOFOL N/A 04/09/2020   Dr. [name]: 4 columns of grade 2/grade 3 esophageal varices somewhat recalcitrant.  Status post esophageal band ligation today.  Portal gastropathy.  Normal-appearing small bowel.   ESOPHAGOGASTRODUODENOSCOPY (EGD) WITH PROPOFOL N/A 07/31/2021   Procedure: ESOPHAGOGASTRODUODENOSCOPY (EGD) WITH PROPOFOL;  Surgeon: [name], MD;  [facility];  Service: Endoscopy;  Laterality: N/A;    FAMILY HISTORY:  Family History  Problem Relation Age of Onset   Other Mother        chronic diarrhea   COPD Mother    Heart disease Mother        died at 13   Arthritis Mother    Cancer Mother    Depression Mother    Diabetes Mother    Hyperlipidemia Mother    Hypertension Mother    Stroke Mother    Arthritis Father    Asthma Father    Birth defects Father    Heart disease Father        aortic valve replaced   Heart disease Maternal Grandmother    Stroke Maternal Grandmother    Heart disease Maternal Grandfather    Stroke Maternal Grandfather    Diabetes Paternal Grandmother    Stroke Paternal Grandfather    Heart disease Paternal Grandfather    Liver disease Neg Hx    Colon cancer Neg Hx     SOCIAL HISTORY:  Social History   Socioeconomic History   Marital status: Married    Spouse name: Melissa   Number of children: 0   Years  of education: 14   Highest education level: Not on file  Occupational History   Occupation: Customer service manager, [employer], travels  Tobacco Use   Smoking status: Never   Smokeless tobacco: Never  Vaping Use   Vaping Use: Never used  Substance and Sexual Activity   Alcohol use: Yes    Alcohol/week: 0.0 standard drinks    Comment: rare, 1-2 drinks a month, on vacation   Drug use: No   Sexual activity: Not Currently  Other Topics Concern   Not on file  Social History Narrative   Lives at home with wife Melissa   One dog.   Has no children.   Eats all food groups.   Works for [employer] and [employer].       Social Determinants of Health   Financial Resource Strain: Not on file  Food Insecurity:  Not on file  Transportation Needs: Not on file  Physical Activity: Not on file  Stress: Not on file  Social Connections: Not on file  Intimate Partner Violence: Not on file    ALLERGIES: Penicillins, Gluten meal, Biaxin [clarithromycin], and Prednisone  MEDICATIONS:  Current Outpatient Medications  Medication Sig Dispense Refill   acetaminophen (TYLENOL) 325 MG tablet Take 650 mg by mouth every 6 (six) hours as needed (for pain.).     atorvastatin (LIPITOR) 20 MG tablet Take 1 tablet (20 mg total) by mouth daily. (Patient taking differently: Take 20 mg by mouth in the morning.) 90 tablet 1   BD PEN NEEDLE NANO 2ND GEN 32G X 4 MM MISC USE TO INJECT INSULIN 4 TIMES DAILY E11.65     benazepril-hydrochlorthiazide (LOTENSIN HCT) 10-12.5 MG tablet Take 1 tablet by mouth in the morning.     Ca Carbonate-Mag Hydroxide (ROLAIDS PO) Take 1 tablet by mouth 4 (four) times daily as needed (heartburn).     Cholecalciferol (VITAMIN D3) 125 MCG (5000 UT) TABS Take 5,000 Units by mouth in the morning.     HUMALOG KWIKPEN 100 UNIT/ML KwikPen Inject 10-16 Units into the skin with breakfast, with lunch, and with evening meal.     LEVEMIR FLEXTOUCH 100 UNIT/ML Pen Inject 36  Units into the skin at bedtime.      Multiple Vitamin (MULTIVITAMIN WITH MINERALS) TABS tablet Take 1 tablet by mouth in the morning.     Cholecalciferol 25 MCG (1000 UT) tablet 5 tablets (5,000 Units total) once daily     Continuous Blood Gluc Receiver (FREESTYLE LIBRE 2 READER) DEVI USE AS DIRECTED TO TEST GLUCOSE LEVELS AT LEAST 4 TIMES DAILY E11.9     Continuous Blood Gluc Sensor (FREESTYLE LIBRE 2 SENSOR) MISC USE AS DIRECTED FOR BLOOD SUGAR MONITORING. CHANGE EVERY 14 DAYS     LORazepam (ATIVAN) 0.5 MG tablet Take 0.5 mg by mouth 2 (two) times daily.     promethazine-dextromethorphan (PROMETHAZINE-DM) 6.25-15 MG/5ML syrup      No current facility-administered medications for this encounter.    REVIEW OF SYSTEMS:  On review of systems, the patient reports that he is doing well overall. He denies any chest pain, shortness of breath, cough, fevers, chills, night sweats. He reports weight loss of 15-20 lbs over the last six weeks, which he attributes to Celiac disease. He has frequent, urgent diarrhea but denies abdominal pain, nausea or vomiting. He denies any new musculoskeletal or joint aches or pains. His IPSS was 10, indicating mild urinary symptoms with frequency and urgency. His SHIM was 6, indicating he has severe erectile dysfunction. A complete review of systems is obtained and is otherwise negative.    PHYSICAL EXAM:  Wt Readings from Last 3 Encounters:  10/13/21 214 lb 1.1 oz (97.1 kg)  10/03/21 216 lb 9.6 oz (98.2 kg)  09/02/21 218 lb (98.9 kg)   Temp Readings from Last 3 Encounters:  10/13/21 98.2 F (36.8 C) (Oral)  10/03/21 97.9 F (36.6 C)  09/03/21 97.8 F (36.6 C) (Tympanic)   BP Readings from Last 3 Encounters:  10/13/21 (!) 110/50  10/03/21 (!) 128/54  09/03/21 (!) 118/54   Pulse Readings from Last 3 Encounters:  10/13/21 66  10/03/21 74  09/03/21 66   Pain Assessment Pain Score: 0-No pain/10  In general this is a well appearing Caucasian male in no  acute distress. He's alert and oriented x4 and appropriate throughout the examination. Cardiopulmonary assessment is negative for acute distress, and  he exhibits normal effort.     KPS = 90  100 - Normal; no complaints; no evidence of disease. 90   - Able to carry on normal activity; minor signs or symptoms of disease. 80   - Normal activity with effort; some signs or symptoms of disease. 48   - Cares for self; unable to carry on normal activity or to do active work. 60   - Requires occasional assistance, but is able to care for most of his personal needs. 50   - Requires considerable assistance and frequent medical care. 2   - Disabled; requires special care and assistance. 31   - Severely disabled; hospital admission is indicated although death not imminent. 93   - Very sick; hospital admission necessary; active supportive treatment necessary. 10   - Moribund; fatal processes progressing rapidly. 0     - Dead  Karnofsky DA, Abelmann [place], Craver LS and Burchenal [facility] [phone]) The use of the nitrogen mustards in the palliative treatment of carcinoma: with particular reference to bronchogenic carcinoma Cancer 1 634-56  LABORATORY DATA:  Lab Results  Component Value Date   WBC 3.1 (L) 10/06/2021   HGB 10.6 (L) 10/06/2021   HCT 31.7 (L) 10/06/2021   MCV 99.1 10/06/2021   PLT 41 (L) 10/06/2021   Lab Results  Component Value Date   NA 136 08/13/2021   K 4.8 08/13/2021   CL 110 08/13/2021   CO2 21 (L) 08/13/2021   Lab Results  Component Value Date   ALT 40 08/13/2021   AST 50 (H) 08/13/2021   ALKPHOS 130 (H) 08/13/2021   BILITOT 1.4 (H) 08/13/2021     RADIOGRAPHY: No results found.    IMPRESSION/PLAN: 1. 60 y.o. gentleman with Stage T1c adenocarcinoma of the prostate with Gleason Score of 3+4, and PSA of 8.2. We discussed the patient's workup and outlined the nature of prostate cancer in this setting. The patient's T stage, Gleason's score, and PSA put him into the favorable  intermediate risk group. Accordingly, he is eligible for a variety of potential treatment options including brachytherapy, 5.5 weeks of external radiation, or prostatectomy. We discussed the available radiation techniques, and focused on the details and logistics of delivery. We discussed and outlined the risks, benefits, short and long-term effects associated with radiotherapy and compared and contrasted these with prostatectomy. We discussed the role of SpaceOAR gel in reducing the rectal toxicity associated with radiotherapy. He appears to have a good understanding of his disease and our treatment recommendations which are of curative intent.  He was encouraged to ask questions that were answered to his stated satisfaction.  At the conclusion of our conversation, the patient is undecided but leaning towards brachytherapy with SpaceOAR gel. He will need a platelet transfusion prior to the procedure, same day, if he elects to proceed. He is hesitant to consider EBRT because of the liklihood of exacerbating his already severe bowel urgency with diarrhea secondary to Celiac disease and he is hesitant to consider surgery due to the risk of bleeding complications secondary to his thrombocytopenia. He will discuss the options with his wife and has our contact information to let us know once he makes a final decision. He did go ahead and meet with Romie Jumper today, who will be working closely with him to coordinate OR scheduling and pre and post procedure appointments. We will share our discussion with Dr. Junious Silk. We enjoyed meeting him today and look forward to continuing to participate in his care.  We personally spent 70 minutes in this encounter including chart review, reviewing radiological studies, meeting face-to-face with the patient, entering orders and completing documentation.    Nicholos Johns, PA-C    Tyler Pita, MD  [facility] Direct Dial: [phone]   Fax: [phone] [email or web address]  Skype  [email or web address]   This document serves as a record of services personally performed by Tyler Pita, MD and Freeman Caldron, PA-C. It was created on their behalf by Wilburn Mylar, a trained medical scribe. The creation of this record is based on the scribe's personal observations and the provider's statements to them. This document has been checked and approved by the attending provider.

## 2021-10-06 ENCOUNTER — Inpatient Hospital Stay (HOSPITAL_COMMUNITY): Payer: BC Managed Care – PPO | Attending: Hematology

## 2021-10-06 ENCOUNTER — Other Ambulatory Visit: Payer: Self-pay

## 2021-10-06 DIAGNOSIS — I10 Essential (primary) hypertension: Secondary | ICD-10-CM | POA: Diagnosis not present

## 2021-10-06 DIAGNOSIS — C61 Malignant neoplasm of prostate: Secondary | ICD-10-CM | POA: Diagnosis present

## 2021-10-06 DIAGNOSIS — R5383 Other fatigue: Secondary | ICD-10-CM | POA: Insufficient documentation

## 2021-10-06 DIAGNOSIS — R161 Splenomegaly, not elsewhere classified: Secondary | ICD-10-CM | POA: Insufficient documentation

## 2021-10-06 DIAGNOSIS — E119 Type 2 diabetes mellitus without complications: Secondary | ICD-10-CM | POA: Insufficient documentation

## 2021-10-06 DIAGNOSIS — K7460 Unspecified cirrhosis of liver: Secondary | ICD-10-CM | POA: Insufficient documentation

## 2021-10-06 DIAGNOSIS — R197 Diarrhea, unspecified: Secondary | ICD-10-CM | POA: Diagnosis not present

## 2021-10-06 DIAGNOSIS — D500 Iron deficiency anemia secondary to blood loss (chronic): Secondary | ICD-10-CM

## 2021-10-06 DIAGNOSIS — D61818 Other pancytopenia: Secondary | ICD-10-CM | POA: Insufficient documentation

## 2021-10-06 DIAGNOSIS — D696 Thrombocytopenia, unspecified: Secondary | ICD-10-CM

## 2021-10-06 DIAGNOSIS — D693 Immune thrombocytopenic purpura: Secondary | ICD-10-CM | POA: Diagnosis not present

## 2021-10-06 DIAGNOSIS — D708 Other neutropenia: Secondary | ICD-10-CM

## 2021-10-06 DIAGNOSIS — D509 Iron deficiency anemia, unspecified: Secondary | ICD-10-CM | POA: Diagnosis not present

## 2021-10-06 LAB — IRON AND TIBC
Iron: 84 ug/dL (ref 45–182)
Saturation Ratios: 32 % (ref 17.9–39.5)
TIBC: 259 ug/dL (ref 250–450)
UIBC: 175 ug/dL

## 2021-10-06 LAB — CBC WITH DIFFERENTIAL/PLATELET
Abs Immature Granulocytes: 0 10*3/uL (ref 0.00–0.07)
Basophils Absolute: 0 10*3/uL (ref 0.0–0.1)
Basophils Relative: 1 %
Eosinophils Absolute: 0.2 10*3/uL (ref 0.0–0.5)
Eosinophils Relative: 7 %
HCT: 31.7 % — ABNORMAL LOW (ref 39.0–52.0)
Hemoglobin: 10.6 g/dL — ABNORMAL LOW (ref 13.0–17.0)
Immature Granulocytes: 0 %
Lymphocytes Relative: 29 %
Lymphs Abs: 0.9 10*3/uL (ref 0.7–4.0)
MCH: 33.1 pg (ref 26.0–34.0)
MCHC: 33.4 g/dL (ref 30.0–36.0)
MCV: 99.1 fL (ref 80.0–100.0)
Monocytes Absolute: 0.4 10*3/uL (ref 0.1–1.0)
Monocytes Relative: 12 %
Neutro Abs: 1.6 10*3/uL — ABNORMAL LOW (ref 1.7–7.7)
Neutrophils Relative %: 51 %
Platelets: 41 10*3/uL — ABNORMAL LOW (ref 150–400)
RBC: 3.2 MIL/uL — ABNORMAL LOW (ref 4.22–5.81)
RDW: 14.8 % (ref 11.5–15.5)
WBC: 3.1 10*3/uL — ABNORMAL LOW (ref 4.0–10.5)
nRBC: 0 % (ref 0.0–0.2)

## 2021-10-06 LAB — FERRITIN: Ferritin: 209 ng/mL (ref 24–336)

## 2021-10-07 ENCOUNTER — Telehealth: Payer: Self-pay | Admitting: *Deleted

## 2021-10-07 NOTE — Telephone Encounter (Signed)
CALLED PATIENT TO ASK QUESTIONS, SPOKE WITH PATIENT AND HE IS STILL TRYING TO MAKE UP HIS MIND, HE WILL CALL ME WHEN HE IS READY TO PROCEED

## 2021-10-09 ENCOUNTER — Telehealth: Payer: Self-pay

## 2021-10-09 NOTE — Telephone Encounter (Signed)
Patient decided to go with having the beams for his prostate cancer. He was told by Dr. Junious Silk that he will do the procedure. He wants to go ahead with this option.  Please advise.  Call back: [phone]  Thanks, Helene Kelp

## 2021-10-10 ENCOUNTER — Telehealth: Payer: Self-pay | Admitting: *Deleted

## 2021-10-10 ENCOUNTER — Other Ambulatory Visit: Payer: Self-pay | Admitting: Urology

## 2021-10-10 DIAGNOSIS — C61 Malignant neoplasm of prostate: Secondary | ICD-10-CM

## 2021-10-10 NOTE — Progress Notes (Signed)
Michael Harmon, [place] [address]   CLINIC:  Medical Oncology/Hematology  PCP:  Lindell Spar, MD [address] [place] [address] [phone]   REASON FOR VISIT:  Follow-up for cirrhosis related pancytopenia  PRIOR THERAPY: Platelet transfusion x1 (prior to prostate biopsy)  CURRENT THERAPY: Intermittent iron infusions (last on 09/03/2021)  INTERVAL HISTORY:  Michael Harmon 60 y.o. male returns for routine follow-up of his cirrhosis related pancytopenia.  He was last seen by Tarri Abernethy PA-C on 08/13/2021.  At today's visit, he reports feeling fair.  Since his last visit, he was diagnosed with prostate cancer by his urologist (Dr. Junious Silk), and has also been seen by radiation oncology [facility] Bruning PA-C / Dr. Tyler Pita).  Patient reports that he has opted for external beam radiation therapy out of the treatment options that he was given, and is in the process of getting that set up at the cancer center in [place], [place].   He will be having markers placed prior to [facility], and is aware that he will likely need platelets before this procedure.  He reports that he did not notice any significant improvement in his symptoms after his IV iron in October 2022.  He remains fatigued, which he attributes to his other chronic issues including his new diagnosis of prostate cancer.  He denies any pica, restless legs, headaches, chest pain, dyspnea on exertion, lightheadedness, or syncope.  Regarding his thrombocytopenia, he reports that his gums frequently bleed when he brushes his teeth, and he has occasional scant epistaxis when he blows his nose.  He denies any major bleeding events, hematemesis, hematochezia, or melena.  He admits to easy bruising but denies any petechial rash.  He denies any recent infections.  He did have self-limited fever and chill after his flu shot this year, but denies any other B-symptoms or signs of systemic infection.   He has not noted any new lumps or bumps.  His weight has been stable since his last appointment (September 2022), but over the past year, he has lost about 15 pounds.  He reports 25% energy, but has decreased appetite (10%).  He continues to experience significant diarrhea secondary to his celiac disease, and reports that he cannot eat out, since any cross-contamination of his food with gluten results in major diarrhea.  He is following at [facility] for his cirrhosis, but reports that he has difficulty getting in touch with staff to get his appointment scheduled.  He reports that he is supposed to have esophageal banding in the spring.   REVIEW OF SYSTEMS:  Review of Systems  Constitutional:  Positive for fatigue. Negative for appetite change, chills, diaphoresis, fever and unexpected weight change.  HENT:   Negative for lump/mass and nosebleeds.   Eyes:  Negative for eye problems.  Respiratory:  Negative for cough, hemoptysis and shortness of breath.   Cardiovascular:  Negative for chest pain, leg swelling and palpitations.  Gastrointestinal:  Positive for diarrhea. Negative for abdominal pain, blood in stool, constipation, nausea and vomiting.  Genitourinary:  Negative for hematuria.   Skin: Negative.   Neurological:  Negative for dizziness, headaches and light-headedness.  Hematological:  Bruises/bleeds easily.  Psychiatric/Behavioral:  Positive for sleep disturbance.      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 Posey Pronto: cecal bx neg for microscopic colitis. ascending colon polyp (adenomatous)   COLONOSCOPY WITH PROPOFOL N/A 07/31/2021   Procedure: COLONOSCOPY WITH PROPOFOL;  Surgeon: [name], MD;  [facility];  Service: Endoscopy;  Laterality: N/A;  7:30am   ESOPHAGEAL BANDING N/A 08/04/2017   Procedure: ESOPHAGEAL BANDING;  Surgeon: [name], MD;  [facility];  Service: Endoscopy;  Laterality: N/A;  esophageal varices banding   ESOPHAGEAL BANDING N/A 10/13/2017   Procedure: ESOPHAGEAL BANDING;  Surgeon: [name], MD;  [facility];  Service: Endoscopy;  Laterality: N/A;   ESOPHAGEAL BANDING N/A 04/09/2020   Procedure: ESOPHAGEAL BANDING;  Surgeon: [name], MD;  [facility];  Service: Endoscopy;  Laterality: N/A;   ESOPHAGOGASTRODUODENOSCOPY N/A 07/09/2016   Procedure: ESOPHAGOGASTRODUODENOSCOPY (EGD);  Surgeon: [name], MD;  [facility];  Service: Endoscopy;  Laterality: N/A;  730   ESOPHAGOGASTRODUODENOSCOPY N/A 08/04/2017   Procedure: ESOPHAGOGASTRODUODENOSCOPY (EGD);  Surgeon: [name], MD;  [facility];  Service: Endoscopy;  Laterality: N/A;  7:30am   ESOPHAGOGASTRODUODENOSCOPY N/A 10/13/2017   Dr. [name]: grade 2 esophageal varices status post banding, portal hypertensive gastropathy    ESOPHAGOGASTRODUODENOSCOPY  11/23/2019   [name]: Esophageal varices, 4 columns of grade 2-3 status post banding.  Varices more prominent than seen in December 2018.  Portal gastropathy.  Normal-appearing small bowel.   ESOPHAGOGASTRODUODENOSCOPY (EGD) WITH PROPOFOL N/A 04/09/2020   Dr. [name]: 4 columns of grade 2/grade 3 esophageal varices somewhat recalcitrant.  Status post esophageal band ligation today.  Portal gastropathy.  Normal-appearing small bowel.   ESOPHAGOGASTRODUODENOSCOPY (EGD) WITH PROPOFOL N/A 07/31/2021   Procedure: ESOPHAGOGASTRODUODENOSCOPY (EGD) WITH PROPOFOL;  Surgeon: [name], MD;  [facility];  Service: Endoscopy;  Laterality: N/A;     SOCIAL HISTORY:  Social History   Socioeconomic History   Marital status: Married    Spouse name: Melissa   Number of children: 0   Years of education: 14   Highest education level: Not on file  Occupational History   Occupation: Customer service manager, [employer], travels  Tobacco Use   Smoking status: Never   Smokeless tobacco: Never  Vaping Use   Vaping Use: Never used  Substance and Sexual Activity   Alcohol use: Yes    Alcohol/week: 0.0 standard drinks    Comment: rare, 1-2 drinks a month, on vacation   Drug use: No  Sexual activity: Not Currently  Other Topics Concern   Not on file  Social History Narrative   Lives at home with wife Melissa   One dog.   Has no children.   Eats all food groups.   Works for [employer] and [employer].       Social Determinants of Health   Financial Resource Strain: Not on file  Food Insecurity: Not on file  Transportation Needs: Not on file  Physical Activity: Not on file  Stress: Not on file  Social Connections: Not on file  Intimate Partner Violence: Not on file    FAMILY HISTORY:  Family History  Problem Relation Age of Onset   Other Mother        chronic diarrhea   COPD Mother    Heart disease Mother        died at 5    Arthritis Mother    Cancer Mother    Depression Mother    Diabetes Mother    Hyperlipidemia Mother    Hypertension Mother    Stroke Mother    Arthritis Father    Asthma Father    Birth defects Father    Heart disease Father        aortic valve replaced   Heart disease Maternal Grandmother    Stroke Maternal Grandmother    Heart disease Maternal Grandfather    Stroke Maternal Grandfather    Diabetes Paternal Grandmother    Stroke Paternal Grandfather    Heart disease Paternal Grandfather    Liver disease Neg Hx    Colon cancer Neg Hx     CURRENT MEDICATIONS:  Outpatient Encounter Medications as of 10/13/2021  Medication Sig Note   acetaminophen (TYLENOL) 325 MG tablet Take 650 mg by mouth every 6 (six) hours as needed (for pain.).    atorvastatin (LIPITOR) 20 MG tablet Take 1 tablet (20 mg total) by mouth daily. (Patient taking differently: Take 20 mg by mouth in the morning.)    BD PEN NEEDLE NANO 2ND GEN 32G X 4 MM MISC USE TO INJECT INSULIN 4 TIMES DAILY E11.65    benazepril-hydrochlorthiazide (LOTENSIN HCT) 10-12.5 MG tablet Take 1 tablet by mouth in the morning.    Ca Carbonate-Mag Hydroxide (ROLAIDS PO) Take 1 tablet by mouth 4 (four) times daily as needed (heartburn).    Cholecalciferol (VITAMIN D3) 125 MCG (5000 UT) TABS Take 5,000 Units by mouth in the morning.    Cholecalciferol 25 MCG (1000 UT) tablet 5 tablets (5,000 Units total) once daily    Continuous Blood Gluc Receiver (FREESTYLE LIBRE 2 READER) DEVI USE AS DIRECTED TO TEST GLUCOSE LEVELS AT LEAST 4 TIMES DAILY E11.9    Continuous Blood Gluc Sensor (FREESTYLE LIBRE 2 SENSOR) MISC USE AS DIRECTED FOR BLOOD SUGAR MONITORING. CHANGE EVERY 14 DAYS    HUMALOG KWIKPEN 100 UNIT/ML KwikPen Inject 10-16 Units into the skin with breakfast, with lunch, and with evening meal.    LEVEMIR FLEXTOUCH 100 UNIT/ML Pen Inject 36 Units into the skin at bedtime.     LORazepam (ATIVAN) 0.5 MG tablet Take 0.5 mg by mouth 2 (two) times  daily. (Patient not taking: Reported on 10/03/2021) 10/03/2021: Patient took for airplane flight.   Multiple Vitamin (MULTIVITAMIN WITH MINERALS) TABS tablet Take 1 tablet by mouth in the morning.    promethazine-dextromethorphan (PROMETHAZINE-DM) 6.25-15 MG/5ML syrup  (Patient not taking: Reported on 10/03/2021)    No facility-administered encounter medications on file as of 10/13/2021.    ALLERGIES:  Allergies  Allergen Reactions   Penicillins Other (See Comments)    As a baby, unknown reaction Did it involve swelling of the face/tongue/throat, SOB, or low BP? Unknown Did it involve sudden or severe rash/hives, skin peeling, or any reaction on the inside of your mouth or nose? Unknown Did you need to seek medical attention at a hospital or doctor's office? Unknown When did it last happen?      infant allergy If all above answers are "NO", may proceed with cephalosporin use. .    Gluten Meal Diarrhea   Biaxin [Clarithromycin] Tinitus    Ears ringing, loss sense of smell   Prednisone Hives    Made blood sugar high     PHYSICAL EXAM:  ECOG PERFORMANCE STATUS: 1 - Symptomatic but completely ambulatory  There were no vitals filed for this visit. There were no vitals filed for this visit. Physical Exam Constitutional:      Appearance: Normal appearance. He is obese.  HENT:     Head: Normocephalic and atraumatic.     Mouth/Throat:     Mouth: Mucous membranes are moist.  Eyes:     Extraocular Movements: Extraocular movements intact.     Pupils: Pupils are equal, round, and reactive to light.  Cardiovascular:     Rate and Rhythm: Normal rate and regular rhythm.     Pulses: Normal pulses.     Heart sounds: Normal heart sounds.  Pulmonary:     Effort: Pulmonary effort is normal.     Breath sounds: Normal breath sounds.  Abdominal:     General: Bowel sounds are normal. There is distension.     Palpations: Abdomen is soft.     Tenderness: There is no abdominal tenderness.   Musculoskeletal:        General: No swelling.     Right lower leg: No edema.     Left lower leg: No edema.  Lymphadenopathy:     Cervical: No cervical adenopathy.  Skin:    General: Skin is warm and dry.     Comments: Spider angiomas and telangiectasias on face and neck  Neurological:     General: No focal deficit present.     Mental Status: He is alert and oriented to person, place, and time.  Psychiatric:        Mood and Affect: Mood normal.        Behavior: Behavior normal.     LABORATORY DATA:  I have reviewed the labs as listed.  CBC    Component Value Date/Time   WBC 3.1 (L) 10/06/2021 0951   RBC 3.20 (L) 10/06/2021 0951   HGB 10.6 (L) 10/06/2021 0951   HGB 11.1 (L) 09/02/2021 1655   HCT 31.7 (L) 10/06/2021 0951   HCT 32.9 (L) 09/02/2021 1655   PLT 41 (L) 10/06/2021 0951   PLT 43 (LL) 09/02/2021 1655   MCV 99.1 10/06/2021 0951   MCV 96 09/02/2021 1655   MCH 33.1 10/06/2021 0951   MCHC 33.4 10/06/2021 0951   RDW 14.8 10/06/2021 0951   RDW 14.0 09/02/2021 1655   LYMPHSABS 0.9 10/06/2021 0951   LYMPHSABS 1.2 09/02/2021 1655   MONOABS 0.4 10/06/2021 0951   EOSABS 0.2 10/06/2021 0951   EOSABS 0.3 09/02/2021 1655   BASOSABS 0.0 10/06/2021 0951   BASOSABS 0.0 09/02/2021 1655   CMP Latest Ref Rng & Units 08/13/2021 07/29/2021 03/19/2021  Glucose 70 - 99 mg/dL 138(H) 134(H) 126(H)  BUN 8 - 23 mg/dL _0 Creatinine  0.61 - 1.24 mg/dL 1.04 1.07 1.06  Sodium 135 - 145 mmol/L 136 136 136  Potassium 3.5 - 5.1 mmol/L 4.8 5.0 4.7  Chloride 98 - 111 mmol/L 110 110 108  CO2 22 - 32 mmol/L 21(L) 21(L) 20  Calcium 8.9 - 10.3 mg/dL 8.8(L) 8.7(L) 8.6  Total Protein 6.5 - 8.1 g/dL 6.7 6.7 6.6  Total Bilirubin 0.3 - 1.2 mg/dL 1.4(H) 0.8 1.1  Alkaline Phos 38 - 126 U/L 130(H) 126 -  AST 15 - 41 U/L 50(H) 54(H) 46(H)  ALT 0 - 44 U/L 40 40 39    DIAGNOSTIC IMAGING:  I have independently reviewed the relevant imaging and discussed with the patient.  ASSESSMENT &  PLAN: 1.  Thrombocytopenia, secondary to cirrhosis and splenomegaly - Patient has history of cirrhosis, has been worked up by external hematologist for thrombocytopenia in the past and then followed by gastroenterology since his thrombocytopenia was thought to be related to cirrhosis. - Recently switched gastroenterologist, and was referred back to hematology by new GI providers. - Platelet count has slowly down trended in the past 5 years.  He has never required platelet transfusions. 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  However, no abdominal work-up was done at the time, but given the fact the patient was diagnosed with cirrhosis in 2017, he likely had some element of liver disease and splenomegaly in 2015, which would explain his thrombocytopenia and cast some suspicion as to whether or not he ever had any ITP. - Abdominal ultrasound (09/17/2021): Cirrhotic liver and splenomegaly (15.4 cm in length) -Work-up negative for other causes of thrombocytopenia - normal B12, methylmalonic acid, folate, copper; negative rheumatoid factor/ANA; no abnormal platelets noted on pathology smear review - Received platelet transfusion x1 on 08/18/2021 prior to prostate biopsy - Admits to easy bruising, gum-bleeding, and occasional scant epistaxis.  No major bleeding events, bright red blood per rectum, or melena. - Most recent CBC (10/06/2021): Platelets 41, stable at baseline - PLAN: No indication for treatment of thrombocytopenia at this time. - If platelets continue to drop to < 30,000, would consider referral for splenic artery embolization.  Prior to any splenic intervention, though, would discuss with [facility] hepatologist and possibly consider bone marrow biopsy. - Repeat labs and RTC in 3 months  2.  Normocytic anemia  with iron deficiency - Patient denies any major bleeding events, but EGD/colonoscopy on 07/31/2021 showed moderately friable portal colopathy as well as gastric erosions and grade 2 and grade 3 varices - Other work-up (08/18/2021) showed normal reticulocytes; normal B12, methylmalonic acid, folate, homocysteine, and copper - Patient reports that he has been diagnosed with celiac disease by Dr. [name] - He received IV Venofer x1000 mg, last dose given 09/03/2021 - He is symptomatic with fatigue.   - He denies any obvious GI bleeding such as melena or bright red blood per rectum - Most recent labs (10/06/2021): Hgb 10.6, ferritin 209, iron saturation 32% - Differential diagnosis favors iron deficiency anemia of mixed etiology - patient has likely malabsorption in the setting of celiac disease and is also at high risk for chronic occult GI bleeding - PLAN: No indication for IV iron at this time. - We will check SPEP/immunofixation due to persistent anemia despite adequate iron. - Repeat labs and RTC in 3 months - will recheck folate,  B12, MMA, and homocysteine due to mildly elevated homocysteine in October 2022.  3.  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  We will continue to follow with repeat CBCs. - Repeat labs and RTC in 3 months   4.  Cirrhosis with esophageal varices - Diagnosed in 2017 - Patient had EGD/colonoscopy on 07/31/2021 which showed portal colopathy as well as gastric erosions and grade 3 varices, which were not banded due to low platelet count - Per Dr. Chryl Heck, she recommended that the patient discuss urgently with his gastroenterologist at [facility] for banding of varices.  Patient may need a unit of platelets prior to procedure, if platelets is under 50,000.   - Patient reports that he is supposed to have another  banding procedure spring 2023 - PLAN: Continue follow-up with hepatologist at [facility]. - Patient informed that he would likely need transfusion of platelets x1 unit if he has platelets of less than 50 at the time of the future EGD/banding procedures.  2.  Stage T1c adenocarcinoma of the prostate, Gleason 3+4 PROSTATE CANCER - Diagnosed in October 2022 with prostate biopsy after abnormal PSA (8.2) - Following with urologist (Dr. Junious Silk) and has also been seen by radiation oncology (Ashlyn Bruning PA-C / Dr. Tyler Pita) - Patient reports that he has opted for external beam radiation therapy out of the treatment options that he was given, and is in the process of getting that set up at the cancer center in [place], [place]. - He will be having markers placed prior to [facility], and is aware that he will likely need platelets before this procedure. - PLAN: Patient to continue follow-up and treatment as per urology and radiation oncology.  He will contact us prior to any procedures in order to receive platelets if needed.   PLAN SUMMARY & DISPOSITION: - Labs today (SPEP, immunofixation, light chains) - will call with results - Labs in 3 months (CBC and nutritional panel) - Office visit after labs - Continue follow-up and treatment of prostate cancer as per urology and radiation oncology.  He will contact us prior to any procedures in order to receive platelets if needed.   All questions were answered. The patient knows to call the clinic with any problems, questions or concerns.  Medical decision making: Moderate  Time spent on visit: I spent 25 minutes counseling the patient face to face. The total time spent in the appointment was 40 minutes and more than 50% was on counseling.   Harriett Rush, PA-C  10/13/21 5:02 PM

## 2021-10-10 NOTE — Telephone Encounter (Signed)
Returned patient's phone call, spoke with patient

## 2021-10-13 ENCOUNTER — Inpatient Hospital Stay (HOSPITAL_COMMUNITY): Payer: BC Managed Care – PPO

## 2021-10-13 ENCOUNTER — Inpatient Hospital Stay (HOSPITAL_BASED_OUTPATIENT_CLINIC_OR_DEPARTMENT_OTHER): Payer: BC Managed Care – PPO | Admitting: Physician Assistant

## 2021-10-13 ENCOUNTER — Other Ambulatory Visit: Payer: Self-pay

## 2021-10-13 VITALS — BP 110/50 | HR 66 | Temp 98.2°F | Resp 16 | Ht 67.0 in | Wt 214.1 lb

## 2021-10-13 DIAGNOSIS — D500 Iron deficiency anemia secondary to blood loss (chronic): Secondary | ICD-10-CM

## 2021-10-13 DIAGNOSIS — C61 Malignant neoplasm of prostate: Secondary | ICD-10-CM | POA: Diagnosis not present

## 2021-10-13 DIAGNOSIS — D708 Other neutropenia: Secondary | ICD-10-CM

## 2021-10-13 DIAGNOSIS — D696 Thrombocytopenia, unspecified: Secondary | ICD-10-CM

## 2021-10-13 DIAGNOSIS — D649 Anemia, unspecified: Secondary | ICD-10-CM

## 2021-10-13 NOTE — Patient Instructions (Signed)
[place] at [facility] Discharge Instructions  You were seen today by Tarri Abernethy PA-C for your low platelets (thrombocytopenia), low blood cells (anemia), and low white blood cells (leukopenia).  THROMBOCYTOPENIA: As we have discussed, your low platelets are most likely related to your liver cirrhosis and enlarged spleen.  They remain stable but low at the range of about 40-50.   Please let us know when you are scheduled for further urology procedures in the future, and we will help coordinate labs and platelets prior to these procedures. When your liver doctor is ready to perform EGD with banding of your varices, we recommend that your platelets be checked beforehand, and if they are less than 50,000, you would benefit from 1 unit of platelets before EGD with banding. Your low platelets and your varices place you at an increased risk of GI bleeding, which in some cases can be fatal.  Please seek immediate medical attention if you notice any large amounts of bright red blood in the toilet, or if you have any black tarry bowel movements.  Other symptoms of blood loss would be dizziness, lightheadedness, worsening fatigue, chest pain, and shortness of breath.  ANEMIA: Your iron levels have improved after your IV iron, but your blood counts remain mildly low even though your iron is normal.  We will check additional labs today to see if there are other abnormalities that may be causing your low blood hemoglobin.  We will discuss these results at your next appointment.  If there are any major abnormalities, we will bring you in for another appointment sooner rather than later.  LEUKOPENIA: Your low white blood cells are also related to your cirrhosis.  However, they are only mildly lower than normal, and there is no cause for concern regarding your white blood cells at this time.  LABS: Labs today before leaving the hospital building.  Return in 3 months for repeat  labs  FOLLOW-UP APPOINTMENT: Office visit in 3 months, after labs   Thank you for choosing [place] at [facility] to provide your oncology and hematology care.  To afford each patient quality time with our provider, please arrive at least 15 minutes before your scheduled appointment time.   If you have a lab appointment with the [facility] please come in thru the Main Entrance and check in at the main information desk.  You need to re-schedule your appointment should you arrive 10 or more minutes late.  We strive to give you quality time with our providers, and arriving late affects you and other patients whose appointments are after yours.  Also, if you no show three or more times for appointments you may be dismissed from the clinic at the providers discretion.     Again, thank you for choosing [facility].  Our hope is that these requests will decrease the amount of time that you wait before being seen by our physicians.       _____________________________________________________________  Should you have questions after your visit to [facility], please contact our office at [phone] and follow the prompts.  Our office hours are 8:00 a.m. and 4:30 p.m. Monday - Friday.  Please note that voicemails left after 4:00 p.m. may not be returned until the following business day.  We are closed weekends and major holidays.  You do have access to a nurse 24-7, just call the main number to the clinic [phone] and do  not press any options, hold on the line and a nurse will answer the phone.    For prescription refill requests, have your pharmacy contact our office and allow 72 hours.    Due to Covid, you will need to wear a mask upon entering the hospital. If you do not have a mask, a mask will be given to you at the Main Entrance upon arrival. For doctor visits, patients may have 1 support person age 61 or older with them. For treatment  visits, patients can not have anyone with them due to social distancing guidelines and our immunocompromised population.

## 2021-10-14 LAB — KAPPA/LAMBDA LIGHT CHAINS
Kappa free light chain: 128.3 mg/L — ABNORMAL HIGH (ref 3.3–19.4)
Kappa, lambda light chain ratio: 1.86 — ABNORMAL HIGH (ref 0.26–1.65)
Lambda free light chains: 69.1 mg/L — ABNORMAL HIGH (ref 5.7–26.3)

## 2021-10-15 LAB — PROTEIN ELECTROPHORESIS, SERUM
A/G Ratio: 1 (ref 0.7–1.7)
Albumin ELP: 3.1 g/dL (ref 2.9–4.4)
Alpha-1-Globulin: 0.2 g/dL (ref 0.0–0.4)
Alpha-2-Globulin: 0.5 g/dL (ref 0.4–1.0)
Beta Globulin: 0.8 g/dL (ref 0.7–1.3)
Gamma Globulin: 1.5 g/dL (ref 0.4–1.8)
Globulin, Total: 3.1 g/dL (ref 2.2–3.9)
Total Protein ELP: 6.2 g/dL (ref 6.0–8.5)

## 2021-10-15 LAB — IMMUNOFIXATION ELECTROPHORESIS
IgA: 374 mg/dL (ref 61–437)
IgG (Immunoglobin G), Serum: 1587 mg/dL (ref 603–1613)
IgM (Immunoglobulin M), Srm: 106 mg/dL (ref 20–172)
Total Protein ELP: 6.5 g/dL (ref 6.0–8.5)

## 2021-10-16 ENCOUNTER — Encounter: Payer: Self-pay | Admitting: Internal Medicine

## 2021-11-11 ENCOUNTER — Encounter: Payer: Self-pay | Admitting: Internal Medicine

## 2021-11-11 ENCOUNTER — Ambulatory Visit: Payer: BC Managed Care – PPO | Admitting: Internal Medicine

## 2021-11-11 ENCOUNTER — Telehealth: Payer: Self-pay | Admitting: Urology

## 2021-11-11 ENCOUNTER — Other Ambulatory Visit: Payer: Self-pay

## 2021-11-11 VITALS — BP 111/53 | HR 68 | Temp 97.5°F | Ht 67.0 in | Wt 214.2 lb

## 2021-11-11 DIAGNOSIS — K900 Celiac disease: Secondary | ICD-10-CM | POA: Diagnosis not present

## 2021-11-11 DIAGNOSIS — I8500 Esophageal varices without bleeding: Secondary | ICD-10-CM

## 2021-11-11 DIAGNOSIS — K7460 Unspecified cirrhosis of liver: Secondary | ICD-10-CM

## 2021-11-11 DIAGNOSIS — C61 Malignant neoplasm of prostate: Secondary | ICD-10-CM

## 2021-11-11 NOTE — Telephone Encounter (Signed)
This patient recently had a consult with Dr. Nada Boozer @ [facility] in [place], [place] on 11/06/2021.  I'm not sure if Dr. Nada Boozer sent you a correspondence so wanted to keep you in the loop.   Dr. Nada Boozer repeated a PSA and per patient it has increased over 3 points.  (PSA on 06/23/2021 8.2).  Dr. Nada Boozer recommendations to obtain additional imaging (CT A/P and Bone Scan) and initiate ADT.     From Nurse [place] - Onc Nurse Nav  I ordered CT andbone scan

## 2021-11-11 NOTE — Patient Instructions (Signed)
It was good seeing you again today!  I do not recommend any change in your medical regimen  Continue being vigilant regarding gluten-free diet  Please work through all your doctors appointments.  Take it 1 step at a time.  Since you are being actively followed at [facility], I will just plan to see you back here in 4 months and as needed  Call me if you have any interim problems

## 2021-11-11 NOTE — Progress Notes (Signed)
RN placed call to patient to follow up on recent referral to [facility] in [place], [place].   Pt verbalized that Dr. Nada Boozer repeated PSA on 11/06/2021 showing an increase of 3.  Previous PSA on 06/23/2021 was 8.2. I have requested from [facility] to send updated notes and most recent PSA.    Per patient, Dr. Nada Boozer has recommended a change in treatment due to increase in PSA.  Again, records requested for continuity of care.  Dr. Junious Silk notified as well.

## 2021-11-11 NOTE — Progress Notes (Signed)
Primary Care Physician:  Lindell Spar, MD Primary Gastroenterologist:  Dr. [name]  Pre-Procedure History & Physical: HPI:  Michael Harmon is a 61 y.o. male here for   For follow-up of [place]/cirrhosis, celiac disease.  Patient has  history of significant pancytopenia with platelet count less than 50,000.  He is being actively evaluated at the hematology center on the fourth floor of any [facility].  History of multiple banding sessions has known grade 2-3 persisting esophageal varices as well as gastric (cardia/fundal varices).  No banding performed last EGD because of a low platelet count.  He is scheduled for an endoscopy with intervention as appropriate at [facility] on February 16.    He was previously recommended he have further endoscopic intervention down there with his drastically low platelet count.  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  He is also being evaluated for prostate cancer which is a new diagnosis.  He is opting for radiation treatment.   Under quite a bit of situational stress also caring for his wife who is handicapped (blind).    Of note,   historically, he has been actively followed down at the [facility] liver clinic.  Past Medical History:  Diagnosis Date   Allergy    nose and sinus problems   Anemia    Asthma    Celiac disease    Cirrhosis, cryptogenic (HCC)    completed Hep A and B vaccines   DM (diabetes mellitus) (HCC)    GERD (gastroesophageal reflux disease)    Hyperlipidemia    diet controlled now with 100 pound weight loss   Hypertension    Iron deficiency anemia due to chronic blood loss 08/13/2021   Malignant neoplasm of prostate ([place]) 10/03/2021   Narrow angle glaucoma suspect of both eyes    Renal cyst 09/03/2017   right   Situational depression 07/06/2017   Splenomegaly    Thrombocytopenia ([place])     hematology, ?related to chol med (tricor) and glimeripide, just being monitored, above 100,000    Past Surgical History:  Procedure Laterality Date   BIOPSY  07/09/2016   Procedure: BIOPSY;  Surgeon: [name], MD;  [facility];  Service: Endoscopy;;  duodenal, gastric, esophaageal   BIOPSY  07/31/2021   Procedure: BIOPSY;  Surgeon: [name], MD;  [facility];  Service: Endoscopy;;   CLOSED MANIPULATION SHOULDER     COLONOSCOPY  11/2015   Dr. Posey Pronto: cecal bx neg for microscopic colitis. ascending colon polyp (adenomatous)   COLONOSCOPY WITH PROPOFOL N/A 07/31/2021   Procedure: COLONOSCOPY WITH PROPOFOL;  Surgeon: [name], MD;  [facility];  Service: Endoscopy;  Laterality: N/A;  7:30am   ESOPHAGEAL BANDING N/A 08/04/2017   Procedure: ESOPHAGEAL BANDING;  Surgeon: [name], MD;  [facility];  Service: Endoscopy;  Laterality: N/A;  esophageal varices banding   ESOPHAGEAL BANDING N/A 10/13/2017   Procedure: ESOPHAGEAL BANDING;  Surgeon: [name], MD;  [facility];  Service: Endoscopy;  Laterality: N/A;   ESOPHAGEAL BANDING N/A 04/09/2020   Procedure: ESOPHAGEAL BANDING;  Surgeon: [name], MD;  [facility];  Service: Endoscopy;  Laterality: N/A;   ESOPHAGOGASTRODUODENOSCOPY N/A 07/09/2016   Procedure: ESOPHAGOGASTRODUODENOSCOPY (EGD);  Surgeon: [name], MD;  [facility];  Service: Endoscopy;  Laterality: N/A;  730   ESOPHAGOGASTRODUODENOSCOPY N/A 08/04/2017   Procedure: ESOPHAGOGASTRODUODENOSCOPY (EGD);  Surgeon: [name], MD;  [facility];  Service: Endoscopy;  Laterality: N/A;  7:30am   ESOPHAGOGASTRODUODENOSCOPY N/A 10/13/2017   Dr. [name]: grade 2 esophageal varices status post banding, portal hypertensive gastropathy   ESOPHAGOGASTRODUODENOSCOPY  11/23/2019   [name]: Esophageal varices, 4 columns of grade 2-3 status post banding.  Varices more  prominent than seen in December 2018.  Portal gastropathy.  Normal-appearing small bowel.   ESOPHAGOGASTRODUODENOSCOPY (EGD) WITH PROPOFOL N/A 04/09/2020   Dr. [name]: 4 columns of grade 2/grade 3 esophageal varices somewhat recalcitrant.  Status post esophageal band ligation today.  Portal gastropathy.  Normal-appearing small bowel.   ESOPHAGOGASTRODUODENOSCOPY (EGD) WITH PROPOFOL N/A 07/31/2021   Procedure: ESOPHAGOGASTRODUODENOSCOPY (EGD) WITH PROPOFOL;  Surgeon: [name], MD;  [facility];  Service: Endoscopy;  Laterality: N/A;    Prior to Admission medications   Medication Sig Start Date End Date Taking? Authorizing Provider  acetaminophen (TYLENOL) 325 MG tablet Take 650 mg by mouth every 6 (six) hours as needed (for pain.).   Yes [provider]  atorvastatin (LIPITOR) 20 MG tablet Take 1 tablet (20 mg total) by mouth daily. Patient taking differently: Take 20 mg by mouth in the morning. 07/21/18  Yes Hagler, Apolonio Schneiders, MD  BD PEN NEEDLE NANO 2ND GEN 32G X 4 MM MISC USE TO INJECT INSULIN 4 TIMES DAILY E11.65 08/06/21  Yes [provider]  benazepril-hydrochlorthiazide (LOTENSIN HCT) 10-12.5 MG tablet Take 1 tablet by mouth in the morning.   Yes [provider]  Ca Carbonate-Mag Hydroxide (ROLAIDS PO) Take 1 tablet by mouth 4 (four) times daily as needed (heartburn).   Yes [provider]  Cholecalciferol (VITAMIN D3) 125 MCG (5000 UT) TABS Take 5,000 Units by mouth in the morning.   Yes [provider]  Cholecalciferol 25 MCG (1000 UT) tablet 5 tablets (5,000 Units total) once daily   Yes [provider]  Continuous Blood Gluc Receiver (FREESTYLE LIBRE 2 READER) DEVI USE AS DIRECTED TO TEST GLUCOSE LEVELS AT LEAST 4 TIMES DAILY E11.9 09/29/21  Yes [provider]  Continuous Blood Gluc Sensor (FREESTYLE LIBRE 2 SENSOR) MISC USE AS DIRECTED FOR BLOOD SUGAR MONITORING. CHANGE EVERY 14 DAYS 08/10/21  Yes [provider]  HUMALOG KWIKPEN 100 UNIT/ML KwikPen Inject 10-16 Units into the skin with breakfast, with lunch, and with evening meal. 06/10/21  Yes [provider]  LEVEMIR FLEXTOUCH 100 UNIT/ML Pen Inject 36 Units into the skin at bedtime.  12/25/18  Yes [provider]  Multiple Vitamin (MULTIVITAMIN WITH MINERALS) TABS tablet Take 1 tablet by mouth in the morning.   Yes [provider]    Allergies as of 11/11/2021 - Review Complete 11/11/2021  Allergen Reaction Noted   Penicillins Other (See Comments) 04/07/2016   Gluten meal Diarrhea 10/06/2017   Biaxin [clarithromycin] Tinitus    Prednisone Hives 11/21/2019    Family History  Problem Relation Age of Onset   Other Mother        chronic diarrhea   COPD Mother    Heart disease Mother        died at 97   Arthritis Mother    Cancer Mother    Depression Mother    Diabetes Mother    Hyperlipidemia Mother    Hypertension Mother    Stroke Mother    Arthritis Father    Asthma Father  Birth defects Father    Heart disease Father        aortic valve replaced   Heart disease Maternal Grandmother    Stroke Maternal Grandmother    Heart disease Maternal Grandfather    Stroke Maternal Grandfather    Diabetes Paternal Grandmother    Stroke Paternal Grandfather    Heart disease Paternal Grandfather    Liver disease Neg Hx    Colon cancer Neg Hx     Social History   Socioeconomic History   Marital status: Married    Spouse name: Michael Harmon   Number of children: 0   Years of education: 14   Highest education level: Not on file  Occupational History   Occupation: Customer service manager, [place], travels  Tobacco Use   Smoking status: Never   Smokeless tobacco: Never  Vaping Use   Vaping Use: Never used  Substance and Sexual Activity   Alcohol use: Not Currently    Comment: rare, 1-2 drinks a month, on vacation   Drug use: No   Sexual activity: Not Currently  Other Topics Concern    Not on file  Social History Narrative   Lives at home with wife Michael Harmon   One dog.   Has no children.   Eats all food groups.   Works for [employer] and [employer].       Social Determinants of Health   Financial Resource Strain: Not on file  Food Insecurity: Not on file  Transportation Needs: Not on file  Physical Activity: Not on file  Stress: Not on file  Social Connections: Not on file  Intimate Partner Violence: Not on file    Review of Systems: See HPI, otherwise negative ROS  Physical Exam: BP (!) 111/53    Pulse 68    Temp (!) 97.5 F (36.4 C) (Temporal)    Ht 5' 7"  (1.702 m)    Wt 214 lb 3.2 oz (97.2 kg)    BMI 33.55 kg/m  General:   Alert,   pleasant and cooperative in NAD Eyes:  Sclera clear, no icterus.   Conjunctiva pink. Mouth:  No deformity or lesions. Neck:  Supple; no masses or thyromegaly. No significant cervical adenopathy. Lungs:  Clear throughout to auscultation.   No wheezes, crackles, or rhonchi. No acute distress. Heart:  Regular rate and rhythm; no murmurs, clicks, rubs,  or gallops. Abdomen: Non-distended, normal bowel sounds.  Soft and nontender without appreciable mass or hepatosplenomegaly.  Pulses:  Normal pulses noted. Extremities:  Without clubbing or edema.  Impression/Plan:    Very pleasant 61 year old gentleman with [name] cirrhosis and celiac disease.  History of portal hypertension with history of  gastroesophageal varices status post multiple banding's in the previously.  He has multiple  health challenges ongoing.   From a GI perspective, discussed the vigilance of staying on a gluten-free diet.   he may do best with meal prep at home over the weekends when he is on the road during the week to assure that he is eating gluten-free.  As far as his liver issues are concerned, low platelet count has supported her efforts to provide treatment of esophageal varices locally given the overall limitations of iron we will  center.  At this time, he remains compensated.  He has a close relationship with hepatologist down at [facility].    Recommendations:  I do not any change in your medical regimen  Continue being vigilant regarding gluten-free diet  Please work through all your doctors  appointments.  Take it 1 step at a time.  Since you are being actively followed at [facility], I will just plan to see you back here in 4 months and as needed  Call me if you have any interim problems  Notice: This dictation was prepared with Dragon dictation along with smaller phrase technology. Any transcriptional errors that result from this process are unintentional and may not be corrected upon review.

## 2021-11-13 ENCOUNTER — Telehealth: Payer: Self-pay

## 2021-11-13 NOTE — Telephone Encounter (Signed)
-----   Message from [name], MD sent at 11/11/2021  3:55 PM EST ----- Regarding: FW: Increased PSA Let Matther know given the PSA rise I'm going to get a CT and bonescan. Once we know the results he'll need a NV for Eligard 45 mg. THanks!   ----- Message ----- From: Rennis Harding, RN Sent: 11/11/2021  11:47 AM EST To: Freeman Caldron, PA-C, [name], MD Subject: Increased PSA                                  Good Morning Dr. Junious Silk,   A[place]a your New Year is starting off well!    This patient recently had a consult with Dr. Nada Boozer @ [facility] in [place], [place] on 11/06/2021.  I'm not sure if Dr. Nada Boozer sent you a correspondence so wanted to keep you in the loop.  Dr. Nada Boozer repeated a PSA and per patient it has increased over 3 points.  (PSA on 06/23/2021 8.2).  Dr. Nada Boozer recommendations to obtain additional imaging (CT A/P and Bone Scan) and initiate ADT.    Notes scanned under media from visit on 1/5, I have requested most recent updates from Dr. Nada Boozer and recent PSA to be sent, and will ensure it gets in [place].     Just wanted to keep you updated,  Thank you!   Kathlee Nations

## 2021-11-13 NOTE — Telephone Encounter (Signed)
Eligard 38m NV scheduled for January 23rd pending insurance authorization.  Patient scheduled for bone scan/CT on 11/14/2021

## 2021-11-24 ENCOUNTER — Other Ambulatory Visit: Payer: Self-pay

## 2021-11-24 ENCOUNTER — Ambulatory Visit (INDEPENDENT_AMBULATORY_CARE_PROVIDER_SITE_OTHER): Payer: BC Managed Care – PPO

## 2021-11-24 DIAGNOSIS — C61 Malignant neoplasm of prostate: Secondary | ICD-10-CM | POA: Diagnosis not present

## 2021-11-24 MED ORDER — LEUPROLIDE ACETATE (6 MONTH) 45 MG ~~LOC~~ KIT
45.0000 mg | PACK | Freq: Once | SUBCUTANEOUS | Status: AC
  Administered 2021-11-24: 45 mg via SUBCUTANEOUS

## 2021-11-24 NOTE — Progress Notes (Signed)
Eligard SubQ Injection   Due to Prostate Cancer patient is present today for a Eligard Injection.  Medication: Eligard 6 month Dose: 45 mg  Location: right  Lot: [id number] Exp: [id number]  Patient tolerated well, no complications were noted  Performed by: Estill Bamberg RN

## 2021-11-24 NOTE — Progress Notes (Signed)
Pt received ADT today with Dr. Junious Silk.  RN spoke with patient and he had additional imaging completed on 1/13 @ [place].   RN successfully faxed initiation of ADT to [place].

## 2021-12-01 ENCOUNTER — Telehealth: Payer: Self-pay

## 2021-12-01 NOTE — Telephone Encounter (Signed)
[facility] called office today to report they are sending over results from patient bone scan/CT results.

## 2021-12-02 ENCOUNTER — Other Ambulatory Visit: Payer: Self-pay

## 2021-12-17 ENCOUNTER — Ambulatory Visit: Payer: BC Managed Care – PPO | Admitting: Gastroenterology

## 2021-12-22 ENCOUNTER — Ambulatory Visit: Payer: BC Managed Care – PPO | Admitting: Urology

## 2021-12-22 ENCOUNTER — Encounter: Payer: Self-pay | Admitting: Urology

## 2021-12-22 ENCOUNTER — Other Ambulatory Visit: Payer: Self-pay

## 2021-12-22 VITALS — BP 129/61 | HR 71 | Wt 211.4 lb

## 2021-12-22 DIAGNOSIS — C61 Malignant neoplasm of prostate: Secondary | ICD-10-CM

## 2021-12-22 NOTE — Patient Instructions (Addendum)
Dear Michael Harmon,   Thank you for choosing [facility] to assist in your urologic care for your upcoming surgery. The following information below includes specific dates and details related to surgery:   The Surgical Procedure you are scheduled to have performed is SpaceOAR with fiducial marker placement   Surgery Date:  March 16th, 2023   Physician performing the surgery: Dr. Nicolette Bang   Do not eat or drink after midnight the day before your surgery.    You will need a driver the day of surgery and will not be able to operate heavy machinery for 24 hours after.    Your surgery will be performed at  [facility] [address],  [address]   Enter at the Main Entrance and check in at the [place] desk.     Pre-Admit Testing Info   Pre- Admit appointments are interview with an anesthesiologist or a pre-operative anesthesia nurse. These appointments are typically completed as an in person visit but can take place over the telephone.  You will be contacted to confirm the date and time window.    If you have any questions or concerns, please don't hesitate to call the office at [phone]   Thank you,    Gibson Ramp, RN Clinical Surgery Coordinator [facility]

## 2021-12-22 NOTE — Progress Notes (Signed)
12/22/2021 11:16 AM   Gust Rung Colvard 1961-05-11 [id number]  Referring provider: Lindell Spar, MD [address],  [place] [address]  No chief complaint on file.   HPI:  Follow-up-   1) prostate cancer-patient diagnosed with favorable intermediate risk prostate cancer October 2022 and being treated with ST-ADT (Jan 2023 - Jul 2023) and IMRT with Dr. Joellyn Rued at [facility] health. CT and bone scan were reported as negative Jan 2023 at [facility] with Dr. Nada Boozer due to PSA up to 13.   Biopsy: October 2022 PSA 8.2 (PSA density 0.13) - repeat PSA was 13  T1c Prostate 63 g Gleason 3+4=7, 1 core, 30% (Gleason 4 component 40%) Gleason 3+3=6, 1 core, 10% Staging: Jan 2023 - [facility] - Bone scan and CT scan - negative    Has NASH and cirrhosis - thrombocytopenia (platelets 52 k) and anemia.  His platelets were 43,000 prior to the biopsy and he had to undergo platelet transfusion prior to biopsy under the care of PA Tarri Abernethy with oncology - her excellent care was greatly appreciated. His MELD score is 7 - per pt. He gets a c-scope once a year for banding.   His dad had PCa.        2) BPH, urgency - AUASS =12. Took tamsulosin in the past. He has urgency and UUI. He recalls a prior PSA of "3-4" and took something to lower it. No constipation. No NG risk.  PVR 86 ml. UA clear.      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    Patient seen today for the above specifically to plan SpaceO[employer] and gold seeds.    PMH: Past Medical History:  Diagnosis Date   Allergy    nose and sinus problems   Anemia    Asthma    Celiac disease    Cirrhosis, cryptogenic (HCC)    completed Hep A and B vaccines   DM (diabetes mellitus) (HCC)    GERD (gastroesophageal reflux disease)     Hyperlipidemia    diet controlled now with 100 pound weight loss   Hypertension    Iron deficiency anemia due to chronic blood loss 08/13/2021   Malignant neoplasm of prostate ([place]) 10/03/2021   Narrow angle glaucoma suspect of both eyes    Renal cyst 09/03/2017   right   Situational depression 07/06/2017   Splenomegaly    Thrombocytopenia ([place])    hematology, ?related to chol med (tricor) and glimeripide, just being monitored, above 100,000    Surgical History: Past Surgical History:  Procedure Laterality Date   BIOPSY  07/09/2016   Procedure: BIOPSY;  Surgeon: [name], MD;  [facility];  Service: Endoscopy;;  duodenal, gastric, esophaageal   BIOPSY  07/31/2021   Procedure: BIOPSY;  Surgeon: [name], MD;  [facility];  Service: Endoscopy;;   CLOSED MANIPULATION SHOULDER     COLONOSCOPY  11/2015   Dr. Posey Pronto: cecal bx neg for microscopic colitis. ascending colon polyp (adenomatous)   COLONOSCOPY WITH PROPOFOL N/A 07/31/2021   Procedure: COLONOSCOPY WITH PROPOFOL;  Surgeon: [name], MD;  [facility];  Service: Endoscopy;  Laterality: N/A;  7:30am   ESOPHAGEAL BANDING N/A 08/04/2017   Procedure: ESOPHAGEAL BANDING;  Surgeon: [name],  [name], MD;  [facility];  Service: Endoscopy;  Laterality: N/A;  esophageal varices banding   ESOPHAGEAL BANDING N/A 10/13/2017   Procedure: ESOPHAGEAL BANDING;  Surgeon: [name], MD;  [facility];  Service: Endoscopy;  Laterality: N/A;   ESOPHAGEAL BANDING N/A 04/09/2020   Procedure: ESOPHAGEAL BANDING;  Surgeon: [name], MD;  [facility];  Service: Endoscopy;  Laterality: N/A;   ESOPHAGOGASTRODUODENOSCOPY N/A 07/09/2016   Procedure: ESOPHAGOGASTRODUODENOSCOPY (EGD);  Surgeon: [name], MD;  [facility];  Service: Endoscopy;  Laterality: N/A;  730   ESOPHAGOGASTRODUODENOSCOPY N/A 08/04/2017   Procedure: ESOPHAGOGASTRODUODENOSCOPY (EGD);   Surgeon: [name], MD;  [facility];  Service: Endoscopy;  Laterality: N/A;  7:30am   ESOPHAGOGASTRODUODENOSCOPY N/A 10/13/2017   Dr. [name]: grade 2 esophageal varices status post banding, portal hypertensive gastropathy   ESOPHAGOGASTRODUODENOSCOPY  11/23/2019   [name]: Esophageal varices, 4 columns of grade 2-3 status post banding.  Varices more prominent than seen in December 2018.  Portal gastropathy.  Normal-appearing small bowel.   ESOPHAGOGASTRODUODENOSCOPY (EGD) WITH PROPOFOL N/A 04/09/2020   Dr. [name]: 4 columns of grade 2/grade 3 esophageal varices somewhat recalcitrant.  Status post esophageal band ligation today.  Portal gastropathy.  Normal-appearing small bowel.   ESOPHAGOGASTRODUODENOSCOPY (EGD) WITH PROPOFOL N/A 07/31/2021   Procedure: ESOPHAGOGASTRODUODENOSCOPY (EGD) WITH PROPOFOL;  Surgeon: [name], MD;  [facility];  Service: Endoscopy;  Laterality: N/A;    Home Medications:  Allergies as of 12/22/2021       Reactions   Penicillins Other (See Comments)   As a baby, unknown reaction Did it involve swelling of the face/tongue/throat, SOB, or low BP? Unknown Did it involve sudden or severe rash/hives, skin peeling, or any reaction on the inside of your mouth or nose? Unknown Did you need to seek medical attention at a hospital or doctor's office? Unknown When did it last happen?      infant allergy If all above answers are NO, may proceed with cephalosporin use. .   Gluten Meal Diarrhea   Biaxin [clarithromycin] Tinitus   Ears ringing, loss sense of smell   Prednisone Hives   Made blood sugar high        Medication List        Accurate as of December 22, 2021 11:16 AM. If you have any questions, ask your nurse or doctor.          acetaminophen 325 MG tablet Commonly known as: TYLENOL Take 650 mg by mouth every 6 (six) hours as needed (for pain.).   atorvastatin 20 MG tablet Commonly known as: LIPITOR Take 1 tablet (20  mg total) by mouth daily. What changed: when to take this   BD Pen Needle Nano 2nd Gen 32G X 4 MM Misc Generic drug: Insulin Pen Needle USE TO INJECT INSULIN 4 TIMES DAILY E11.65   benazepril-hydrochlorthiazide 10-12.5 MG tablet Commonly known as: LOTENSIN HCT Take 1 tablet by mouth in the morning.   FreeStyle Libre 2 Reader Kerrin Mo USE AS DIRECTED TO TEST GLUCOSE LEVELS AT LEAST 4 TIMES DAILY E11.9   FreeStyle Libre 2 Sensor Misc USE AS DIRECTED FOR BLOOD SUGAR MONITORING. CHANGE EVERY 14 DAYS   HumaLOG KwikPen 100 UNIT/ML KwikPen Generic drug: insulin lispro Inject 10-16 Units into the skin with breakfast, with lunch, and with evening meal.   Levemir FlexTouch 100 UNIT/ML FlexPen Generic drug: insulin detemir Inject 36 Units into the skin at bedtime.  multivitamin with minerals Tabs tablet Take 1 tablet by mouth in the morning.   ROLAIDS PO Take 1 tablet by mouth 4 (four) times daily as needed (heartburn).   Vitamin D3 125 MCG (5000 UT) Tabs Take 5,000 Units by mouth in the morning.   Cholecalciferol 25 MCG (1000 UT) tablet 5 tablets (5,000 Units total) once daily        Allergies:  Allergies  Allergen Reactions   Penicillins Other (See Comments)    As a baby, unknown reaction Did it involve swelling of the face/tongue/throat, SOB, or low BP? Unknown Did it involve sudden or severe rash/hives, skin peeling, or any reaction on the inside of your mouth or nose? Unknown Did you need to seek medical attention at a hospital or doctor's office? Unknown When did it last happen?      infant allergy If all above answers are NO, may proceed with cephalosporin use. .    Gluten Meal Diarrhea   Biaxin [Clarithromycin] Tinitus    Ears ringing, loss sense of smell   Prednisone Hives    Made blood sugar high    Family History: Family History  Problem Relation Age of Onset   Other Mother        chronic diarrhea   COPD Mother    Heart disease Mother        died at  27   Arthritis Mother    Cancer Mother    Depression Mother    Diabetes Mother    Hyperlipidemia Mother    Hypertension Mother    Stroke Mother    Arthritis Father    Asthma Father    Birth defects Father    Heart disease Father        aortic valve replaced   Heart disease Maternal Grandmother    Stroke Maternal Grandmother    Heart disease Maternal Grandfather    Stroke Maternal Grandfather    Diabetes Paternal Grandmother    Stroke Paternal Grandfather    Heart disease Paternal Grandfather    Liver disease Neg Hx    Colon cancer Neg Hx     Social History:  reports that he has never smoked. He has never used smokeless tobacco. He reports that he does not currently use alcohol. He reports that he does not use drugs.   Physical Exam: BP 129/61    Pulse 71    Wt 211 lb 6.4 oz (95.9 kg)    BMI 33.11 kg/m   Constitutional:  Alert and oriented, No acute distress. HEENT: [place] AT, moist mucus membranes.  Trachea midline, no masses. Cardiovascular: No clubbing, cyanosis, or edema. Respiratory: Normal respiratory effort, no increased work of breathing. GI: Abdomen is soft, nontender, nondistended, no abdominal masses GU: No CVA tenderness Skin: No rashes, bruises or suspicious lesions. Neurologic: Grossly intact, no focal deficits, moving all 4 extremities. Psychiatric: Normal mood and affect.  Laboratory Data: Lab Results  Component Value Date   WBC 3.1 (L) 10/06/2021   HGB 10.6 (L) 10/06/2021   HCT 31.7 (L) 10/06/2021   MCV 99.1 10/06/2021   PLT 41 (L) 10/06/2021    Lab Results  Component Value Date   CREATININE 1.04 08/13/2021    No results found for: PSA  No results found for: TESTOSTERONE  Lab Results  Component Value Date   HGBA1C 5.4 09/02/2021    Urinalysis    Component Value Date/Time   COLORURINE YELLOW 06/07/2020 2150   APPEARANCEUR Clear 05/19/2021 1151   LABSPEC 1.012 06/07/2020  2150   PHURINE 5.0 06/07/2020 2150   GLUCOSEU Negative 05/19/2021  1151   HGBUR NEGATIVE 06/07/2020 2150   BILIRUBINUR Negative 05/19/2021 [place] 06/07/2020 2150   PROTEINUR Negative 05/19/2021 1151   PROTEINUR NEGATIVE 06/07/2020 2150   NITRITE Negative 05/19/2021 1151   NITRITE NEGATIVE 06/07/2020 2150   LEUKOCYTESUR Negative 05/19/2021 [place] 06/07/2020 2150    Lab Results  Component Value Date   LABMICR See below: 05/19/2021   WBCUA None seen 05/19/2021   LABEPIT None seen 05/19/2021   BACTERIA None seen 05/19/2021    Pertinent Imaging: Ct and bone scan reports -    Assessment & Plan:    Prostate cancer - negative staging with Dr. Nada Boozer.  I drew patient a picture of the anatomy.  We went over the nature risk benefits and alternatives to SpaceOAR biodegradable gel and gold seeds.  We discussed risk of bladder, urethral or rectal injury among others.  I will also coordinate with PA [facility] regarding a check of his platelets and possible platelet transfusion the morning of his procedure. See back in about 3 months.   No follow-ups on file.  [name], MD  [facility]  [address] [place], [place] [address] [phone]

## 2021-12-24 ENCOUNTER — Other Ambulatory Visit (HOSPITAL_COMMUNITY): Payer: Self-pay | Admitting: Physician Assistant

## 2021-12-24 DIAGNOSIS — D696 Thrombocytopenia, unspecified: Secondary | ICD-10-CM

## 2021-12-25 ENCOUNTER — Other Ambulatory Visit: Payer: Self-pay

## 2022-01-09 NOTE — Patient Instructions (Signed)
Your procedure is scheduled on: 01/15/2022  Report to [facility] at   9:30  AM unless oncology tells you earlier for platelet infusion prior to procedure  Call this number if you have problems the morning of surgery: [phone]   Remember:   Do not Eat or Drink after midnight         No Smoking the morning of surgery  :  Take these medicines the morning of surgery with A SIP OF WATER: none  No diabetic medication am of procedure.  Take only 1/2 dose of Levemir 18 units the night before procedure.      Do not wear jewelry, make-up or nail polish.  Do not wear lotions, powders, or perfumes. You may wear deodorant.  Do not shave 48 hours prior to surgery. Men may shave face and neck.  Do not bring valuables to the hospital.  Contacts, dentures or bridgework may not be worn into surgery.  Leave suitcase in the car. After surgery it may be brought to your room.  For patients admitted to the hospital, checkout time is 11:00 AM the day of discharge.   Patients discharged the day of surgery will not be allowed to drive home.    Special Instructions: Shower using CHG night before surgery and shower the day of surgery use CHG.  Use special wash - you have one bottle of CHG for all showers.  You should use approximately 1/2 of the bottle for each shower.  How to Use Chlorhexidine for Bathing Chlorhexidine gluconate (CHG) is a germ-killing (antiseptic) solution that is used to clean the skin. It can get rid of the bacteria that normally live on the skin and can keep them away for about 24 hours. To clean your skin with CHG, you may be given: A CHG solution to use in the shower or as part of a sponge bath. A prepackaged cloth that contains CHG. Cleaning your skin with CHG may help lower the risk for infection: While you are staying in the intensive care unit of the hospital. If you have a vascular access, such as a central line, to provide short-term or long-term access to your  veins. If you have a catheter to drain urine from your bladder. If you are on a ventilator. A ventilator is a machine that helps you breathe by moving air in and out of your lungs. After surgery. What are the risks? Risks of using CHG include: A skin reaction. Hearing loss, if CHG gets in your ears and you have a perforated eardrum. Eye injury, if CHG gets in your eyes and is not rinsed out. The CHG product catching fire. Make sure that you avoid smoking and flames after applying CHG to your skin. Do not use CHG: If you have a chlorhexidine allergy or have previously reacted to chlorhexidine. On babies younger than 59 months of age. How to use CHG solution Use CHG only as told by your health care provider, and follow the instructions on the label. Use the full amount of CHG as directed. Usually, this is one bottle. During a shower Follow these steps when using CHG solution during a shower (unless your health care provider gives you different instructions): Start the shower. Use your normal soap and shampoo to wash your face and hair. Turn off the shower or move out of the shower stream. Pour the CHG onto a clean washcloth. Do not use any type of brush or rough-edged sponge. Starting at your neck, lather  your body down to your toes. Make sure you follow these instructions: If you will be having surgery, pay special attention to the part of your body where you will be having surgery. Scrub this area for at least 1 minute. Do not use CHG on your head or face. If the solution gets into your ears or eyes, rinse them well with water. Avoid your genital area. Avoid any areas of skin that have broken skin, cuts, or scrapes. Scrub your back and under your arms. Make sure to wash skin folds. Let the lather sit on your skin for 1-2 minutes or as long as told by your health care provider. Thoroughly rinse your entire body in the shower. Make sure that all body creases and crevices are rinsed  well. Dry off with a clean towel. Do not put any substances on your body afterward--such as powder, lotion, or perfume--unless you are told to do so by your health care provider. Only use lotions that are recommended by the manufacturer. Put on clean clothes or pajamas. If it is the night before your surgery, sleep in clean sheets.  During a sponge bath Follow these steps when using CHG solution during a sponge bath (unless your health care provider gives you different instructions): Use your normal soap and shampoo to wash your face and hair. Pour the CHG onto a clean washcloth. Starting at your neck, lather your body down to your toes. Make sure you follow these instructions: If you will be having surgery, pay special attention to the part of your body where you will be having surgery. Scrub this area for at least 1 minute. Do not use CHG on your head or face. If the solution gets into your ears or eyes, rinse them well with water. Avoid your genital area. Avoid any areas of skin that have broken skin, cuts, or scrapes. Scrub your back and under your arms. Make sure to wash skin folds. Let the lather sit on your skin for 1-2 minutes or as long as told by your health care provider. Using a different clean, wet washcloth, thoroughly rinse your entire body. Make sure that all body creases and crevices are rinsed well. Dry off with a clean towel. Do not put any substances on your body afterward--such as powder, lotion, or perfume--unless you are told to do so by your health care provider. Only use lotions that are recommended by the manufacturer. Put on clean clothes or pajamas. If it is the night before your surgery, sleep in clean sheets. How to use CHG prepackaged cloths Only use CHG cloths as told by your health care provider, and follow the instructions on the label. Use the CHG cloth on clean, dry skin. Do not use the CHG cloth on your head or face unless your health care provider tells  you to. When washing with the CHG cloth: Avoid your genital area. Avoid any areas of skin that have broken skin, cuts, or scrapes. Before surgery Follow these steps when using a CHG cloth to clean before surgery (unless your health care provider gives you different instructions): Using the CHG cloth, vigorously scrub the part of your body where you will be having surgery. Scrub using a back-and-forth motion for 3 minutes. The area on your body should be completely wet with CHG when you are done scrubbing. Do not rinse. Discard the cloth and let the area air-dry. Do not put any substances on the area afterward, such as powder, lotion, or perfume. Put on clean  clothes or pajamas. If it is the night before your surgery, sleep in clean sheets.  For general bathing Follow these steps when using CHG cloths for general bathing (unless your health care provider gives you different instructions). Use a separate CHG cloth for each area of your body. Make sure you wash between any folds of skin and between your fingers and toes. Wash your body in the following order, switching to a new cloth after each step: The front of your neck, shoulders, and chest. Both of your arms, under your arms, and your hands. Your stomach and groin area, avoiding the genitals. Your right leg and foot. Your left leg and foot. The back of your neck, your back, and your buttocks. Do not rinse. Discard the cloth and let the area air-dry. Do not put any substances on your body afterward--such as powder, lotion, or perfume--unless you are told to do so by your health care provider. Only use lotions that are recommended by the manufacturer. Put on clean clothes or pajamas. Contact a health care provider if: Your skin gets irritated after scrubbing. You have questions about using your solution or cloth. You swallow any chlorhexidine. Call your local poison control center (1-3478641239 in the [place] Get help right away if: Your  eyes itch badly, or they become very red or swollen. Your skin itches badly and is red or swollen. Your hearing changes. You have trouble seeing. You have swelling or tingling in your mouth or throat. You have trouble breathing. These symptoms may represent a serious problem that is an emergency. Do not wait to see if the symptoms will go away. Get medical help right away. Call your local emergency services (911 in the [place] Do not drive yourself to the hospital. Summary Chlorhexidine gluconate (CHG) is a germ-killing (antiseptic) solution that is used to clean the skin. Cleaning your skin with CHG may help to lower your risk for infection. You may be given CHG to use for bathing. It may be in a bottle or in a prepackaged cloth to use on your skin. Carefully follow your health care provider's instructions and the instructions on the product label. Do not use CHG if you have a chlorhexidine allergy. Contact your health care provider if your skin gets irritated after scrubbing. This information is not intended to replace advice given to you by your health care provider. Make sure you discuss any questions you have with your health care provider. Document Revised: 12/30/2020 Document Reviewed: 12/30/2020 Elsevier Patient Education  2022 [place]. Transrectal Ultrasound-Guided Prostate Gold Seed Placement, Care After The following information offers guidance on how to care for yourself after your procedure. Your health care provider may also give you more specific instructions. If you have problems or questions, contact your health care provider. What can I expect after the procedure? After the procedure, it is common to have: Light bleeding from the rectum. Bruising or tenderness in the area behind the scrotum (perineum), if the needle was put into your prostate through this area. Small amounts of blood in your urine. This should only last for a few days. Light brown or red semen. This may  last for a couple of weeks. Follow these instructions at home: Medicines  Take over-the-counter and prescription medicines only as told by your health care provider. If you were prescribed an antibiotic, take it as told by your health care provider. Do not stop taking the antibiotic early, even if you start to feel better. Ask your health care  provider if the medicine prescribed to you requires you to avoid driving or using machinery. Eating and drinking Follow instructions from your health care provider about eating or drinking restrictions. Drink enough fluid to keep your urine pale yellow. Managing pain and swelling If directed, put ice on the affected area. To do this: Put ice in a plastic bag. Place a towel between your skin and the bag. Leave the ice on for 20 minutes, 2-3 times a day. Be sure to remove the ice if your skin turns bright red. If you cannot feel pain, heat, or cold, you have a greater risk of damage to the area. Try not to sit directly on the area behind the scrotum. A soft cushion can help with discomfort. Activity If you were given a sedative during the procedure, it can affect you for several hours. Do not drive or operate machinery until your health care provider says that it is safe. Return to your normal activities as told by your health care provider. Ask your health care provider what activities are safe for you. Follow instructions from your health care provider about when it is safe for you to engage in sexual activity. General instructions Plan to have a responsible adult care for you for the time you are told after you leave the hospital or clinic. Do not take baths, swim, or use a hot tub until your health care provider approves. Ask your health care provider if you may take showers. You may only be allowed to take sponge baths. Keep all follow-up visits. Contact a health care provider if: You have a fever or chills. You have more blood in your urine. You  have blood in your urine for more than 2-3 days after the procedure. You have trouble passing urine or having a bowel movement. You have pain or burning when urinating. You have nausea or you vomit. Get help right away if: You have severe pain that does not get better with medicine. Your urine is bright red. You cannot urinate. You have rectal bleeding that gets worse. You have shortness of breath. Summary After the procedure, you may have blood in your urine and light bleeding from the rectum. Return to your normal activities as told by your health care provider. Ask your health care provider what activities are safe for you. Take over-the-counter and prescription medicines only as told by your health care provider. Contact your health care provider right away if your urine is bright red or you cannot pass urine. This information is not intended to replace advice given to you by your health care provider. Make sure you discuss any questions you have with your health care provider. Document Revised: 01/15/2021 Document Reviewed: 01/15/2021 Elsevier Patient Education  [place] Anesthesia, Adult, Care After This sheet gives you information about how to care for yourself after your procedure. Your health care provider may also give you more specific instructions. If you have problems or questions, contact your health care provider. What can I expect after the procedure? After the procedure, the following side effects are common: Pain or discomfort at the IV site. Nausea. Vomiting. Sore throat. Trouble concentrating. Feeling cold or chills. Feeling weak or tired. Sleepiness and fatigue. Soreness and body aches. These side effects can affect parts of the body that were not involved in surgery. Follow these instructions at home: For the time period you were told by your health care provider:  Rest. Do not participate in activities where you could fall  or become  injured. Do not drive or use machinery. Do not drink alcohol. Do not take sleeping pills or medicines that cause drowsiness. Do not make important decisions or sign legal documents. Do not take care of children on your own. Eating and drinking Follow any instructions from your health care provider about eating or drinking restrictions. When you feel hungry, start by eating small amounts of foods that are soft and easy to digest (bland), such as toast. Gradually return to your regular diet. Drink enough fluid to keep your urine pale yellow. If you vomit, rehydrate by drinking water, juice, or clear broth. General instructions If you have sleep apnea, surgery and certain medicines can increase your risk for breathing problems. Follow instructions from your health care provider about wearing your sleep device: Anytime you are sleeping, including during daytime naps. While taking prescription pain medicines, sleeping medicines, or medicines that make you drowsy. Have a responsible adult stay with you for the time you are told. It is important to have someone help care for you until you are awake and alert. Return to your normal activities as told by your health care provider. Ask your health care provider what activities are safe for you. Take over-the-counter and prescription medicines only as told by your health care provider. If you smoke, do not smoke without supervision. Keep all follow-up visits as told by your health care provider. This is important. Contact a health care provider if: You have nausea or vomiting that does not get better with medicine. You cannot eat or drink without vomiting. You have pain that does not get better with medicine. You are unable to pass urine. You develop a skin rash. You have a fever. You have redness around your IV site that gets worse. Get help right away if: You have difficulty breathing. You have chest pain. You have blood in your urine or stool,  or you vomit blood. Summary After the procedure, it is common to have a sore throat or nausea. It is also common to feel tired. Have a responsible adult stay with you for the time you are told. It is important to have someone help care for you until you are awake and alert. When you feel hungry, start by eating small amounts of foods that are soft and easy to digest (bland), such as toast. Gradually return to your regular diet. Drink enough fluid to keep your urine pale yellow. Return to your normal activities as told by your health care provider. Ask your health care provider what activities are safe for you. This information is not intended to replace advice given to you by your health care provider. Make sure you discuss any questions you have with your health care provider. Document Revised: 07/04/2020 Document Reviewed: 02/01/2020 Elsevier Patient Education  2022 [employer].

## 2022-01-12 NOTE — Patient Instructions (Addendum)
Michael Harmon  01/12/2022     @PREFPERIOPPHARMACY @   Your procedure is scheduled on 01/15/2022.    Report to [facility] at  0930 A.M.   Call this number if you have problems the morning of surgery:  [phone]   Remember:  Do not eat or drink after midnight.    Take 36 units of levemir the night before your procedure.   DO NOT take any medications for diabetes the morning of your procedure.          Take these medicines the morning of surgery with A SIP OF WATER   None    Do not wear jewelry, make-up or nail polish.  Do not wear lotions, powders, or perfumes, or deodorant.  Do not shave 48 hours prior to surgery.  Men may shave face and neck.  Do not bring valuables to the hospital.  [facility] is not responsible for any belongings or valuables.  Contacts, dentures or bridgework may not be worn into surgery.  Leave your suitcase in the car.  After surgery it may be brought to your room.  For patients admitted to the hospital, discharge time will be determined by your treatment team.  Patients discharged the day of surgery will not be allowed to drive home and must ave someone with them for 24 hours.    Special instructions:  DO NOT smoke tobacco or vape for 24 hours before your procedure.  Please read over the following fact sheets that you were given. Coughing and Deep Breathing, Surgical Site Infection Prevention, Anesthesia Post-op Instructions, and Care and Recovery After Surgery       Transrectal Ultrasound-Guided Prostate Gold Seed Placement, Care After The following information offers guidance on how to care for yourself after your procedure. Your health care provider may also give you more specific instructions. If you have problems or questions, contact your health care provider. What can I expect after the procedure? After the procedure, it is common to have: Light bleeding from the rectum. Bruising or tenderness in the area behind  the scrotum (perineum), if the needle was put into your prostate through this area. Small amounts of blood in your urine. This should only last for a few days. Light brown or red semen. This may last for a couple of weeks. Follow these instructions at home: Medicines  Take over-the-counter and prescription medicines only as told by your health care provider. If you were prescribed an antibiotic, take it as told by your health care provider. Do not stop taking the antibiotic early, even if you start to feel better. Ask your health care provider if the medicine prescribed to you requires you to avoid driving or using machinery. Eating and drinking Follow instructions from your health care provider about eating or drinking restrictions. Drink enough fluid to keep your urine pale yellow. Managing pain and swelling If directed, put ice on the affected area. To do this: Put ice in a plastic bag. Place a towel between your skin and the bag. Leave the ice on for 20 minutes, 2-3 times a day. Be sure to remove the ice if your skin turns bright red. If you cannot feel pain, heat, or cold, you have a greater risk of damage to the area. Try not to sit directly on the area behind the scrotum. A soft cushion can help with discomfort. Activity If you were given a sedative during the procedure, it can affect you for  several hours. Do not drive or operate machinery until your health care provider says that it is safe. Return to your normal activities as told by your health care provider. Ask your health care provider what activities are safe for you. Follow instructions from your health care provider about when it is safe for you to engage in sexual activity. General instructions Plan to have a responsible adult care for you for the time you are told after you leave the hospital or clinic. Do not take baths, swim, or use a hot tub until your health care provider approves. Ask your health care provider if you  may take showers. You may only be allowed to take sponge baths. Keep all follow-up visits. Contact a health care provider if: You have a fever or chills. You have more blood in your urine. You have blood in your urine for more than 2-3 days after the procedure. You have trouble passing urine or having a bowel movement. You have pain or burning when urinating. You have nausea or you vomit. Get help right away if: You have severe pain that does not get better with medicine. Your urine is bright red. You cannot urinate. You have rectal bleeding that gets worse. You have shortness of breath. Summary After the procedure, you may have blood in your urine and light bleeding from the rectum. Return to your normal activities as told by your health care provider. Ask your health care provider what activities are safe for you. Take over-the-counter and prescription medicines only as told by your health care provider. Contact your health care provider right away if your urine is bright red or you cannot pass urine. This information is not intended to replace advice given to you by your health care provider. Make sure you discuss any questions you have with your health care provider. Document Revised: 01/15/2021 Document Reviewed: 01/15/2021 Elsevier Patient Education  [place] Anesthesia, Adult, Care After This sheet gives you information about how to care for yourself after your procedure. Your health care provider may also give you more specific instructions. If you have problems or questions, contact your health care provider. What can I expect after the procedure? After the procedure, the following side effects are common: Pain or discomfort at the IV site. Nausea. Vomiting. Sore throat. Trouble concentrating. Feeling cold or chills. Feeling weak or tired. Sleepiness and fatigue. Soreness and body aches. These side effects can affect parts of the body that were not  involved in surgery. Follow these instructions at home: For the time period you were told by your health care provider:  Rest. Do not participate in activities where you could fall or become injured. Do not drive or use machinery. Do not drink alcohol. Do not take sleeping pills or medicines that cause drowsiness. Do not make important decisions or sign legal documents. Do not take care of children on your own. Eating and drinking Follow any instructions from your health care provider about eating or drinking restrictions. When you feel hungry, start by eating small amounts of foods that are soft and easy to digest (bland), such as toast. Gradually return to your regular diet. Drink enough fluid to keep your urine pale yellow. If you vomit, rehydrate by drinking water, juice, or clear broth. General instructions If you have sleep apnea, surgery and certain medicines can increase your risk for breathing problems. Follow instructions from your health care provider about wearing your sleep device: Anytime you are sleeping, including during daytime  naps. While taking prescription pain medicines, sleeping medicines, or medicines that make you drowsy. Have a responsible adult stay with you for the time you are told. It is important to have someone help care for you until you are awake and alert. Return to your normal activities as told by your health care provider. Ask your health care provider what activities are safe for you. Take over-the-counter and prescription medicines only as told by your health care provider. If you smoke, do not smoke without supervision. Keep all follow-up visits as told by your health care provider. This is important. Contact a health care provider if: You have nausea or vomiting that does not get better with medicine. You cannot eat or drink without vomiting. You have pain that does not get better with medicine. You are unable to pass urine. You develop a skin  rash. You have a fever. You have redness around your IV site that gets worse. Get help right away if: You have difficulty breathing. You have chest pain. You have blood in your urine or stool, or you vomit blood. Summary After the procedure, it is common to have a sore throat or nausea. It is also common to feel tired. Have a responsible adult stay with you for the time you are told. It is important to have someone help care for you until you are awake and alert. When you feel hungry, start by eating small amounts of foods that are soft and easy to digest (bland), such as toast. Gradually return to your regular diet. Drink enough fluid to keep your urine pale yellow. Return to your normal activities as told by your health care provider. Ask your health care provider what activities are safe for you. This information is not intended to replace advice given to you by your health care provider. Make sure you discuss any questions you have with your health care provider. Document Revised: 07/04/2020 Document Reviewed: 02/01/2020 Elsevier Patient Education  2022 [employer]. How to Use Chlorhexidine for Bathing Chlorhexidine gluconate (CHG) is a germ-killing (antiseptic) solution that is used to clean the skin. It can get rid of the bacteria that normally live on the skin and can keep them away for about 24 hours. To clean your skin with CHG, you may be given: A CHG solution to use in the shower or as part of a sponge bath. A prepackaged cloth that contains CHG. Cleaning your skin with CHG may help lower the risk for infection: While you are staying in the intensive care unit of the hospital. If you have a vascular access, such as a central line, to provide short-term or long-term access to your veins. If you have a catheter to drain urine from your bladder. If you are on a ventilator. A ventilator is a machine that helps you breathe by moving air in and out of your lungs. After surgery. What  are the risks? Risks of using CHG include: A skin reaction. Hearing loss, if CHG gets in your ears and you have a perforated eardrum. Eye injury, if CHG gets in your eyes and is not rinsed out. The CHG product catching fire. Make sure that you avoid smoking and flames after applying CHG to your skin. Do not use CHG: If you have a chlorhexidine allergy or have previously reacted to chlorhexidine. On babies younger than 73 months of age. How to use CHG solution Use CHG only as told by your health care provider, and follow the instructions on the label. Use  the full amount of CHG as directed. Usually, this is one bottle. During a shower Follow these steps when using CHG solution during a shower (unless your health care provider gives you different instructions): Start the shower. Use your normal soap and shampoo to wash your face and hair. Turn off the shower or move out of the shower stream. Pour the CHG onto a clean washcloth. Do not use any type of brush or rough-edged sponge. Starting at your neck, lather your body down to your toes. Make sure you follow these instructions: If you will be having surgery, pay special attention to the part of your body where you will be having surgery. Scrub this area for at least 1 minute. Do not use CHG on your head or face. If the solution gets into your ears or eyes, rinse them well with water. Avoid your genital area. Avoid any areas of skin that have broken skin, cuts, or scrapes. Scrub your back and under your arms. Make sure to wash skin folds. Let the lather sit on your skin for 1-2 minutes or as long as told by your health care provider. Thoroughly rinse your entire body in the shower. Make sure that all body creases and crevices are rinsed well. Dry off with a clean towel. Do not put any substances on your body afterward--such as powder, lotion, or perfume--unless you are told to do so by your health care provider. Only use lotions that are  recommended by the manufacturer. Put on clean clothes or pajamas. If it is the night before your surgery, sleep in clean sheets.  During a sponge bath Follow these steps when using CHG solution during a sponge bath (unless your health care provider gives you different instructions): Use your normal soap and shampoo to wash your face and hair. Pour the CHG onto a clean washcloth. Starting at your neck, lather your body down to your toes. Make sure you follow these instructions: If you will be having surgery, pay special attention to the part of your body where you will be having surgery. Scrub this area for at least 1 minute. Do not use CHG on your head or face. If the solution gets into your ears or eyes, rinse them well with water. Avoid your genital area. Avoid any areas of skin that have broken skin, cuts, or scrapes. Scrub your back and under your arms. Make sure to wash skin folds. Let the lather sit on your skin for 1-2 minutes or as long as told by your health care provider. Using a different clean, wet washcloth, thoroughly rinse your entire body. Make sure that all body creases and crevices are rinsed well. Dry off with a clean towel. Do not put any substances on your body afterward--such as powder, lotion, or perfume--unless you are told to do so by your health care provider. Only use lotions that are recommended by the manufacturer. Put on clean clothes or pajamas. If it is the night before your surgery, sleep in clean sheets. How to use CHG prepackaged cloths Only use CHG cloths as told by your health care provider, and follow the instructions on the label. Use the CHG cloth on clean, dry skin. Do not use the CHG cloth on your head or face unless your health care provider tells you to. When washing with the CHG cloth: Avoid your genital area. Avoid any areas of skin that have broken skin, cuts, or scrapes. Before surgery Follow these steps when using a CHG cloth to clean  before  surgery (unless your health care provider gives you different instructions): Using the CHG cloth, vigorously scrub the part of your body where you will be having surgery. Scrub using a back-and-forth motion for 3 minutes. The area on your body should be completely wet with CHG when you are done scrubbing. Do not rinse. Discard the cloth and let the area air-dry. Do not put any substances on the area afterward, such as powder, lotion, or perfume. Put on clean clothes or pajamas. If it is the night before your surgery, sleep in clean sheets.  For general bathing Follow these steps when using CHG cloths for general bathing (unless your health care provider gives you different instructions). Use a separate CHG cloth for each area of your body. Make sure you wash between any folds of skin and between your fingers and toes. Wash your body in the following order, switching to a new cloth after each step: The front of your neck, shoulders, and chest. Both of your arms, under your arms, and your hands. Your stomach and groin area, avoiding the genitals. Your right leg and foot. Your left leg and foot. The back of your neck, your back, and your buttocks. Do not rinse. Discard the cloth and let the area air-dry. Do not put any substances on your body afterward--such as powder, lotion, or perfume--unless you are told to do so by your health care provider. Only use lotions that are recommended by the manufacturer. Put on clean clothes or pajamas. Contact a health care provider if: Your skin gets irritated after scrubbing. You have questions about using your solution or cloth. You swallow any chlorhexidine. Call your local poison control center (1-(807)258-9695 in the [place] Get help right away if: Your eyes itch badly, or they become very red or swollen. Your skin itches badly and is red or swollen. Your hearing changes. You have trouble seeing. You have swelling or tingling in your mouth or throat. You  have trouble breathing. These symptoms may represent a serious problem that is an emergency. Do not wait to see if the symptoms will go away. Get medical help right away. Call your local emergency services (911 in the [place] Do not drive yourself to the hospital. Summary Chlorhexidine gluconate (CHG) is a germ-killing (antiseptic) solution that is used to clean the skin. Cleaning your skin with CHG may help to lower your risk for infection. You may be given CHG to use for bathing. It may be in a bottle or in a prepackaged cloth to use on your skin. Carefully follow your health care provider's instructions and the instructions on the product label. Do not use CHG if you have a chlorhexidine allergy. Contact your health care provider if your skin gets irritated after scrubbing. This information is not intended to replace advice given to you by your health care provider. Make sure you discuss any questions you have with your health care provider. Document Revised: 12/30/2020 Document Reviewed: 12/30/2020 Elsevier Patient Education  2022 [employer].

## 2022-01-13 ENCOUNTER — Encounter (HOSPITAL_COMMUNITY)
Admission: RE | Admit: 2022-01-13 | Discharge: 2022-01-13 | Disposition: A | Discharge status: Home / Self Care | Payer: BC Managed Care – PPO | Source: Ambulatory Visit | Attending: Urology | Admitting: Urology

## 2022-01-13 NOTE — Telephone Encounter (Signed)
Please see response by patient ?

## 2022-01-14 ENCOUNTER — Inpatient Hospital Stay (HOSPITAL_COMMUNITY): Payer: BC Managed Care – PPO | Attending: Hematology

## 2022-01-14 ENCOUNTER — Other Ambulatory Visit (HOSPITAL_COMMUNITY): Payer: Self-pay

## 2022-01-14 DIAGNOSIS — E119 Type 2 diabetes mellitus without complications: Secondary | ICD-10-CM | POA: Insufficient documentation

## 2022-01-14 DIAGNOSIS — K7460 Unspecified cirrhosis of liver: Secondary | ICD-10-CM | POA: Diagnosis not present

## 2022-01-14 DIAGNOSIS — C61 Malignant neoplasm of prostate: Secondary | ICD-10-CM | POA: Diagnosis not present

## 2022-01-14 DIAGNOSIS — Z809 Family history of malignant neoplasm, unspecified: Secondary | ICD-10-CM | POA: Insufficient documentation

## 2022-01-14 DIAGNOSIS — I10 Essential (primary) hypertension: Secondary | ICD-10-CM | POA: Diagnosis not present

## 2022-01-14 DIAGNOSIS — D72819 Decreased white blood cell count, unspecified: Secondary | ICD-10-CM | POA: Diagnosis not present

## 2022-01-14 DIAGNOSIS — D61818 Other pancytopenia: Secondary | ICD-10-CM | POA: Diagnosis present

## 2022-01-14 DIAGNOSIS — K766 Portal hypertension: Secondary | ICD-10-CM | POA: Diagnosis not present

## 2022-01-14 DIAGNOSIS — Z9081 Acquired absence of spleen: Secondary | ICD-10-CM | POA: Diagnosis not present

## 2022-01-14 DIAGNOSIS — D509 Iron deficiency anemia, unspecified: Secondary | ICD-10-CM | POA: Diagnosis not present

## 2022-01-14 DIAGNOSIS — D696 Thrombocytopenia, unspecified: Secondary | ICD-10-CM

## 2022-01-14 DIAGNOSIS — R634 Abnormal weight loss: Secondary | ICD-10-CM | POA: Diagnosis not present

## 2022-01-14 DIAGNOSIS — D500 Iron deficiency anemia secondary to blood loss (chronic): Secondary | ICD-10-CM

## 2022-01-14 DIAGNOSIS — I8501 Esophageal varices with bleeding: Secondary | ICD-10-CM | POA: Diagnosis not present

## 2022-01-14 DIAGNOSIS — D649 Anemia, unspecified: Secondary | ICD-10-CM

## 2022-01-14 DIAGNOSIS — D294 Benign neoplasm of scrotum: Secondary | ICD-10-CM | POA: Insufficient documentation

## 2022-01-14 DIAGNOSIS — K900 Celiac disease: Secondary | ICD-10-CM | POA: Insufficient documentation

## 2022-01-14 DIAGNOSIS — D708 Other neutropenia: Secondary | ICD-10-CM

## 2022-01-14 DIAGNOSIS — D6959 Other secondary thrombocytopenia: Secondary | ICD-10-CM | POA: Insufficient documentation

## 2022-01-14 LAB — CBC WITH DIFFERENTIAL/PLATELET
Abs Immature Granulocytes: 0.01 10*3/uL (ref 0.00–0.07)
Basophils Absolute: 0 10*3/uL (ref 0.0–0.1)
Basophils Relative: 0 %
Eosinophils Absolute: 0.2 10*3/uL (ref 0.0–0.5)
Eosinophils Relative: 8 %
HCT: 29.1 % — ABNORMAL LOW (ref 39.0–52.0)
Hemoglobin: 9.4 g/dL — ABNORMAL LOW (ref 13.0–17.0)
Immature Granulocytes: 0 %
Lymphocytes Relative: 32 %
Lymphs Abs: 0.9 10*3/uL (ref 0.7–4.0)
MCH: 32.2 pg (ref 26.0–34.0)
MCHC: 32.3 g/dL (ref 30.0–36.0)
MCV: 99.7 fL (ref 80.0–100.0)
Monocytes Absolute: 0.2 10*3/uL (ref 0.1–1.0)
Monocytes Relative: 8 %
Neutro Abs: 1.4 10*3/uL — ABNORMAL LOW (ref 1.7–7.7)
Neutrophils Relative %: 52 %
Platelets: 35 10*3/uL — ABNORMAL LOW (ref 150–400)
RBC: 2.92 MIL/uL — ABNORMAL LOW (ref 4.22–5.81)
RDW: 13.2 % (ref 11.5–15.5)
WBC: 2.7 10*3/uL — ABNORMAL LOW (ref 4.0–10.5)
nRBC: 0 % (ref 0.0–0.2)

## 2022-01-14 LAB — IRON AND TIBC
Iron: 86 ug/dL (ref 45–182)
Saturation Ratios: 38 % (ref 17.9–39.5)
TIBC: 227 ug/dL — ABNORMAL LOW (ref 250–450)
UIBC: 141 ug/dL

## 2022-01-14 LAB — SAMPLE TO BLOOD BANK

## 2022-01-14 LAB — FERRITIN: Ferritin: 192 ng/mL (ref 24–336)

## 2022-01-14 LAB — VITAMIN B12: Vitamin B-12: 612 pg/mL (ref 180–914)

## 2022-01-14 LAB — FOLATE: Folate: 35.6 ng/mL (ref >5.9)

## 2022-01-14 NOTE — Progress Notes (Unsigned)
Platelets are 35 today. Per PA will give one unit of platelets tomorrow prior to surgery.  ?

## 2022-01-15 ENCOUNTER — Ambulatory Visit (HOSPITAL_COMMUNITY): Payer: BC Managed Care – PPO | Admitting: Anesthesiology

## 2022-01-15 ENCOUNTER — Other Ambulatory Visit (HOSPITAL_COMMUNITY): Payer: Self-pay | Admitting: Physician Assistant

## 2022-01-15 ENCOUNTER — Inpatient Hospital Stay (HOSPITAL_COMMUNITY): Payer: BC Managed Care – PPO

## 2022-01-15 ENCOUNTER — Ambulatory Visit (HOSPITAL_COMMUNITY)
Admission: RE | Admit: 2022-01-15 | Discharge: 2022-01-15 | Disposition: A | Discharge status: Home / Self Care | Payer: BC Managed Care – PPO | Attending: Urology | Admitting: Urology

## 2022-01-15 ENCOUNTER — Encounter (HOSPITAL_COMMUNITY): Payer: Self-pay | Admitting: Urology

## 2022-01-15 ENCOUNTER — Ambulatory Visit (HOSPITAL_COMMUNITY): Payer: BC Managed Care – PPO

## 2022-01-15 ENCOUNTER — Encounter (HOSPITAL_COMMUNITY)
Admission: RE | Disposition: A | Discharge status: Home / Self Care | Payer: Self-pay | Source: Home / Self Care | Attending: Urology

## 2022-01-15 ENCOUNTER — Other Ambulatory Visit: Payer: Self-pay

## 2022-01-15 DIAGNOSIS — D294 Benign neoplasm of scrotum: Secondary | ICD-10-CM | POA: Diagnosis not present

## 2022-01-15 DIAGNOSIS — C61 Malignant neoplasm of prostate: Secondary | ICD-10-CM

## 2022-01-15 DIAGNOSIS — K219 Gastro-esophageal reflux disease without esophagitis: Secondary | ICD-10-CM | POA: Diagnosis not present

## 2022-01-15 DIAGNOSIS — D696 Thrombocytopenia, unspecified: Secondary | ICD-10-CM | POA: Insufficient documentation

## 2022-01-15 DIAGNOSIS — D649 Anemia, unspecified: Secondary | ICD-10-CM | POA: Insufficient documentation

## 2022-01-15 DIAGNOSIS — K7460 Unspecified cirrhosis of liver: Secondary | ICD-10-CM | POA: Diagnosis not present

## 2022-01-15 DIAGNOSIS — D61818 Other pancytopenia: Secondary | ICD-10-CM | POA: Diagnosis not present

## 2022-01-15 DIAGNOSIS — E119 Type 2 diabetes mellitus without complications: Secondary | ICD-10-CM | POA: Insufficient documentation

## 2022-01-15 DIAGNOSIS — I10 Essential (primary) hypertension: Secondary | ICD-10-CM | POA: Insufficient documentation

## 2022-01-15 DIAGNOSIS — K7581 Nonalcoholic steatohepatitis (NASH): Secondary | ICD-10-CM | POA: Insufficient documentation

## 2022-01-15 HISTORY — PX: GOLD SEED IMPLANT: SHX6343

## 2022-01-15 HISTORY — PX: SPACE OAR INSTILLATION: SHX6769

## 2022-01-15 LAB — HOMOCYSTEINE: Homocysteine: 18.7 umol/L — ABNORMAL HIGH (ref 0.0–17.2)

## 2022-01-15 LAB — GLUCOSE, CAPILLARY: Glucose-Capillary: 89 mg/dL (ref 70–99)

## 2022-01-15 SURGERY — INSERTION, GOLD SEEDS
Anesthesia: General | Site: Rectum

## 2022-01-15 MED ORDER — SODIUM CHLORIDE 0.9% IV SOLUTION
250.0000 mL | Freq: Once | INTRAVENOUS | Status: AC
  Administered 2022-01-15: 250 mL via INTRAVENOUS

## 2022-01-15 MED ORDER — LACTATED RINGERS IV SOLN: INTRAVENOUS | Status: DC

## 2022-01-15 MED ORDER — LIDOCAINE HCL (PF) 2 % IJ SOLN
INTRAMUSCULAR | Status: AC
  Filled 2022-01-15: qty 5

## 2022-01-15 MED ORDER — ONDANSETRON HCL 4 MG/2ML IJ SOLN
INTRAMUSCULAR | Status: AC
  Filled 2022-01-15: qty 2

## 2022-01-15 MED ORDER — FENTANYL CITRATE PF 50 MCG/ML IJ SOSY: 25.0000 ug | PREFILLED_SYRINGE | INTRAMUSCULAR | Status: DC | PRN

## 2022-01-15 MED ORDER — LIDOCAINE HCL (CARDIAC) PF 100 MG/5ML IV SOSY
PREFILLED_SYRINGE | INTRAVENOUS | Status: DC | PRN
  Administered 2022-01-15: 40 mg via INTRAVENOUS

## 2022-01-15 MED ORDER — FENTANYL CITRATE (PF) 250 MCG/5ML IJ SOLN
INTRAMUSCULAR | Status: DC | PRN
  Administered 2022-01-15 (×4): 25 ug via INTRAVENOUS

## 2022-01-15 MED ORDER — ONDANSETRON HCL 4 MG/2ML IJ SOLN: 4.0000 mg | Freq: Once | INTRAMUSCULAR | Status: DC | PRN

## 2022-01-15 MED ORDER — CHLORHEXIDINE GLUCONATE 0.12 % MT SOLN
15.0000 mL | Freq: Once | OROMUCOSAL | Status: AC
  Administered 2022-01-15: 15 mL via OROMUCOSAL

## 2022-01-15 MED ORDER — CEFAZOLIN SODIUM-DEXTROSE 2-4 GM/100ML-% IV SOLN
2.0000 g | INTRAVENOUS | Status: AC
  Administered 2022-01-15: 2 g via INTRAVENOUS

## 2022-01-15 MED ORDER — ORAL CARE MOUTH RINSE: 15.0000 mL | Freq: Once | OROMUCOSAL | Status: AC

## 2022-01-15 MED ORDER — 0.9 % SODIUM CHLORIDE (POUR BTL) OPTIME
TOPICAL | Status: DC | PRN
  Administered 2022-01-15: 1000 mL

## 2022-01-15 MED ORDER — DIPHENHYDRAMINE HCL 25 MG PO CAPS
25.0000 mg | ORAL_CAPSULE | Freq: Once | ORAL | Status: AC
  Administered 2022-01-15: 25 mg via ORAL
  Filled 2022-01-15: qty 1

## 2022-01-15 MED ORDER — ACETAMINOPHEN 325 MG PO TABS
650.0000 mg | ORAL_TABLET | Freq: Once | ORAL | Status: AC
  Administered 2022-01-15: 650 mg via ORAL
  Filled 2022-01-15: qty 2

## 2022-01-15 MED ORDER — FENTANYL CITRATE (PF) 100 MCG/2ML IJ SOLN
INTRAMUSCULAR | Status: AC
  Filled 2022-01-15: qty 2

## 2022-01-15 MED ORDER — STERILE WATER FOR IRRIGATION IR SOLN: Status: DC | PRN

## 2022-01-15 MED ORDER — ONDANSETRON HCL 4 MG/2ML IJ SOLN
INTRAMUSCULAR | Status: DC | PRN
  Administered 2022-01-15: 4 mg via INTRAVENOUS

## 2022-01-15 MED ORDER — TRAMADOL HCL 50 MG PO TABS: 50.0000 mg | ORAL_TABLET | Freq: Four times a day (QID) | ORAL | 0 refills | Status: DC | PRN

## 2022-01-15 MED ORDER — PROPOFOL 10 MG/ML IV BOLUS
INTRAVENOUS | Status: DC | PRN
Start: 2022-01-15 — End: 2022-01-15
  Administered 2022-01-15: 160 mg via INTRAVENOUS

## 2022-01-15 MED ORDER — CEFAZOLIN SODIUM-DEXTROSE 2-4 GM/100ML-% IV SOLN
INTRAVENOUS | Status: AC
  Filled 2022-01-15: qty 100

## 2022-01-15 MED ORDER — EPHEDRINE 5 MG/ML INJ
INTRAVENOUS | Status: AC
  Filled 2022-01-15: qty 5

## 2022-01-15 SURGICAL SUPPLY — 18 items
COVER BACK TABLE 60X90IN (DRAPES) ×3 IMPLANT
DRAPE LEGGINS SURG 28X43 STRL (DRAPES) ×3 IMPLANT
DRAPE U-SHAPE 47X51 STRL (DRAPES) ×1 IMPLANT
DRSG TEGADERM 8X12 (GAUZE/BANDAGES/DRESSINGS) ×3 IMPLANT
GAUZE SPONGE 4X4 12PLY STRL (GAUZE/BANDAGES/DRESSINGS) ×3 IMPLANT
GLOVE SRG 8 PF TXTR STRL LF DI (GLOVE) ×2 IMPLANT
GLOVE SURG UNDER POLY LF SZ7 (GLOVE) ×3 IMPLANT
GLOVE SURG UNDER POLY LF SZ8 (GLOVE) ×3
GOWN STRL REUS W/TWL XL LVL3 (GOWN DISPOSABLE) ×3 IMPLANT
IMPL SPACEOAR SYSTEM 10ML (Spacer) ×2 IMPLANT
IMPLANT SPACEOAR SYSTEM 10ML (Spacer) ×3 IMPLANT
KIT TURNOVER CYSTO (KITS) ×3 IMPLANT
MARKER GOLD PRELOAD 1.2X3 (Urological Implant) ×2 IMPLANT
SEED GOLD PRELOAD 1.2X3 (Urological Implant) ×9 IMPLANT
SURGILUBE 2OZ TUBE FLIPTOP (MISCELLANEOUS) ×3 IMPLANT
TOWEL NATURAL 4PK STERILE (DISPOSABLE) ×3 IMPLANT
UNDERPAD 30X36 HEAVY ABSORB (UNDERPADS AND DIAPERS) ×3 IMPLANT
WATER STERILE IRR 500ML POUR (IV SOLUTION) ×3 IMPLANT

## 2022-01-15 NOTE — Anesthesia Postprocedure Evaluation (Signed)
Anesthesia Post Note ? ?Patient: Michael Harmon ? ?Procedure(s) Performed: GOLD SEED IMPLANT (Prostate) ? ?Patient location during evaluation: Phase II ?Anesthesia Type: General ?Level of consciousness: awake ?Pain management: pain level controlled ?Vital Signs Assessment: post-procedure vital signs reviewed and stable ?Respiratory status: spontaneous breathing and respiratory function stable ?Cardiovascular status: blood pressure returned to baseline and stable ?Postop Assessment: no headache and no apparent nausea or vomiting ?Anesthetic complications: no ?Comments: Late entry ? ? ?No notable events documented. ? ? ?Last Vitals:  ?Vitals:  ? 01/15/22 1345 01/15/22 1400  ?BP: 119/60 111/61  ?Pulse: 72 63  ?Resp: 10 (!) 9  ?Temp:    ?SpO2: 100% 100%  ?  ?Last Pain:  ?Vitals:  ? 01/15/22 1400  ?PainSc: 0-No pain  ? ? ?  ?  ?  ?  ?  ?  ? ?Louann Sjogren ? ? ? ? ?

## 2022-01-15 NOTE — Anesthesia Procedure Notes (Addendum)
Procedure Name: LMA Insertion ?Date/Time: 01/15/2022 12:50 PM ?Performed by: Karna Dupes, CRNA ?Pre-anesthesia Checklist: Patient identified, Emergency Drugs available, Suction available and Patient being monitored ?Patient Re-evaluated:Patient Re-evaluated prior to induction ?Oxygen Delivery Method: Circle system utilized ?Preoxygenation: Pre-oxygenation with 100% oxygen ?Induction Type: IV induction ?LMA: LMA inserted ?LMA Size: 3.0 ?Number of attempts: 1 ?Placement Confirmation: breath sounds checked- equal and bilateral and positive ETCO2 ?Tube secured with: Tape ?Dental Injury: Teeth and Oropharynx as per pre-operative assessment  ? ? ? ? ?

## 2022-01-15 NOTE — Progress Notes (Signed)
Patient presents today for one unit of platelets.  Patient is in satisfactory condition with no new complaints voiced.  Vital signs are stable.  Labs were done yesterday.  Patient's hemoglobin yesterday was 9.4 and platelets were 35.  Patient is scheduled for a procedure and will need platelets prior.  We will proceed with platelets per MD orders.  ? ?Patient tolerated platelets well with no complaints voiced.  IV site remains clean, dry, and intact.  Patient left ambulatory in stable condition.  Vital signs stable at discharge.  Follow up as scheduled.    ?

## 2022-01-15 NOTE — Op Note (Signed)
PRE-OPERATIVE DIAGNOSIS:  Adenocarcinoma of the prostate ? ?POST-OPERATIVE DIAGNOSIS:  Same ? ?PROCEDURE: 1. Prostate Ultrasound ?2. Placement of fiducial marks ?3. Placement of SpaceOAR ? ?SURGEON:  Surgeon(s): ?Nicolette Bang, MD ? ?ANESTHESIA:  General ? ?EBL:  Minimal ? ?DRAINS: none ? ?Findings: 43.65 cc prostate gland on [place] ? ?INDICATION: Michael Harmon is a 61 year old with a history of T1c prostate cancer who is scheduled to undergo IMRT. He wishes to have fiducial markers and SpaceOAR placed prior to IMRT to decrease rectal toxicity. ? ?Description of procedure: After informed consent the patient was brought to the major OR, placed on the table and administered general anesthesia. He was then moved to the modified lithotomy position with his perineum perpendicular to the floor. His perineum and genitalia were then sterilely prepped. An official timeout was then performed. The transrectal ultrasound probe was placed in the rectum and affixed to the stand. He was then sterilely draped. ? ?A transrectal ultrasound of the prostate was performed.  Lidocaine  was not  instilled using ultrasound guidance into the junction of each seminal vesicle of the prostate. ? ?3 Gold markers were placed into the prostate using the standard template and ultrasound guidance.  Accurate placement of the markers was confirmed. ? ?We then proceeded to mix the SpaceOAR using the kit supplied from the manufacturer. Once this was complete we placed a sinal needle into the perirectal fat between the rectum and the prostate. Once this was accomplished we injected 2cc of normal saline to hydrodissect the plain. We then instilled the the SpaceOAR through the spinal needle and noted good distribution in the perirectal fat.  ? ?The patient was awakened and taken to recovery room in stable and satisfactory condition. He tolerated procedure well and there were no intraoperative complications. ? ?CONDITION: Stable, extubated, transferred to  PACU ? ?PLAN: The patient is to be discharged home and he will start IMRT in the next 2-3 weeks ? ?

## 2022-01-15 NOTE — Patient Instructions (Signed)
[facility]  Discharge Instructions: Thank you for choosing [facility] to provide your oncology and hematology care.  If you have a lab appointment with the Cancer Center, please come in thru the Main Entrance and check in at the main information desk.  Wear comfortable clothing and clothing appropriate for easy access to any Portacath or PICC line.   We strive to give you quality time with your provider. You may need to reschedule your appointment if you arrive late (15 or more minutes).  Arriving late affects you and other patients whose appointments are after yours.  Also, if you miss three or more appointments without notifying the office, you may be dismissed from the clinic at the provider's discretion.      For prescription refill requests, have your pharmacy contact our office and allow 72 hours for refills to be completed.        To help prevent nausea and vomiting after your treatment, we encourage you to take your nausea medication as directed.  BELOW ARE SYMPTOMS THAT SHOULD BE REPORTED IMMEDIATELY: *FEVER GREATER THAN 100.4 F (38 C) OR HIGHER *CHILLS OR SWEATING *NAUSEA AND VOMITING THAT IS NOT CONTROLLED WITH YOUR NAUSEA MEDICATION *UNUSUAL SHORTNESS OF BREATH *UNUSUAL BRUISING OR BLEEDING *URINARY PROBLEMS (pain or burning when urinating, or frequent urination) *BOWEL PROBLEMS (unusual diarrhea, constipation, pain near the anus) TENDERNESS IN MOUTH AND THROAT WITH OR WITHOUT PRESENCE OF ULCERS (sore throat, sores in mouth, or a toothache) UNUSUAL RASH, SWELLING OR PAIN  UNUSUAL VAGINAL DISCHARGE OR ITCHING   Items with * indicate a potential emergency and should be followed up as soon as possible or go to the Emergency Department if any problems should occur.  Please show the CHEMOTHERAPY ALERT CARD or IMMUNOTHERAPY ALERT CARD at check-in to the Emergency Department and triage nurse.  Should you have questions after your visit or need to cancel  or reschedule your appointment, please contact [facility] [phone]  and follow the prompts.  Office hours are 8:00 a.m. to 4:30 p.m. Monday - Friday. Please note that voicemails left after 4:00 p.m. may not be returned until the following business day.  We are closed weekends and major holidays. You have access to a nurse at all times for urgent questions. Please call the main number to the clinic [phone] and follow the prompts.  For any non-urgent questions, you may also contact your provider using MyChart. We now offer e-Visits for anyone 18 and older to request care online for non-urgent symptoms. For details visit [email or web address].   Also download the MyChart app! Go to the app store, search "MyChart", open the app, select [facility], and log in with your MyChart username and password.  Due to Covid, a mask is required upon entering the hospital/clinic. If you do not have a mask, one will be given to you upon arrival. For doctor visits, patients may have 1 support person aged 18 or older with them. For treatment visits, patients cannot have anyone with them due to current Covid guidelines and our immunocompromised population.  

## 2022-01-15 NOTE — Anesthesia Preprocedure Evaluation (Signed)
Anesthesia Evaluation  ?Patient identified by MRN, date of birth, ID band ?Patient awake ? ? ? ?Reviewed: ?Allergy & Precautions, H&P , NPO status , Patient's Chart, lab work & pertinent test results, reviewed documented beta blocker date and time  ? ?Airway ?Mallampati: II ? ?TM Distance: >3 FB ?Neck ROM: full ? ? ? Dental ?no notable dental hx. ? ?  ?Pulmonary ?asthma ,  ?  ?Pulmonary exam normal ?breath sounds clear to auscultation ? ? ? ? ? ? Cardiovascular ?Exercise Tolerance: Good ?hypertension, negative cardio ROS ? ? ?Rhythm:regular Rate:Normal ? ? ?  ?Neuro/Psych ?PSYCHIATRIC DISORDERS Depression negative neurological ROS ?   ? GI/Hepatic ?GERD  Medicated,(+) Cirrhosis  ?  ?  ? ,   ?Endo/Other  ?negative endocrine ROSdiabetes ? Renal/GU ?negative Renal ROS  ?negative genitourinary ?  ?Musculoskeletal ? ? Abdominal ?  ?Peds ? Hematology ? ?(+) Blood dyscrasia, anemia ,   ?Anesthesia Other Findings ? ? Reproductive/Obstetrics ?negative OB ROS ? ?  ? ? ? ? ? ? ? ? ? ? ? ? ? ?  ?  ? ? ? ? ? ? ? ? ?Anesthesia Physical ?Anesthesia Plan ? ?ASA: 2 ? ?Anesthesia Plan: General and General LMA  ? ?Post-op Pain Management:   ? ?Induction:  ? ?PONV Risk Score and Plan: Ondansetron ? ?Airway Management Planned:  ? ?Additional Equipment:  ? ?Intra-op Plan:  ? ?Post-operative Plan:  ? ?Informed Consent: I have reviewed the patients History and Physical, chart, labs and discussed the procedure including the risks, benefits and alternatives for the proposed anesthesia with the patient or authorized representative who has indicated his/her understanding and acceptance.  ? ? ? ?Dental Advisory Given ? ?Plan Discussed with: CRNA ? ?Anesthesia Plan Comments:   ? ? ? ? ? ? ?Anesthesia Quick Evaluation ? ?

## 2022-01-15 NOTE — Interval H&P Note (Signed)
History and Physical Interval Note: ? ?01/15/2022 ?11:35 AM ? ?Michael Harmon  has presented today for surgery, with the diagnosis of prostate cancer.  The various methods of treatment have been discussed with the patient and family. After consideration of risks, benefits and other options for treatment, the patient has consented to  Procedure(s) with comments: ?GOLD SEED IMPLANT (N/A) - can not be done earlier, pt is getting platelets prior to surgery as a surgical intervention.  The patient's history has been reviewed, patient examined, no change in status, stable for surgery.  I have reviewed the patient's chart and labs.  Questions were answered to the patient's satisfaction.   ? ? ?Nicolette Bang ? ? ?

## 2022-01-15 NOTE — Transfer of Care (Signed)
Immediate Anesthesia Transfer of Care Note ? ?Patient: Michael Harmon ? ?Procedure(s) Performed: GOLD SEED IMPLANT (Prostate) ? ?Patient Location: PACU ? ?Anesthesia Type:General ? ?Level of Consciousness: awake, alert  and oriented ? ?Airway & Oxygen Therapy: Patient Spontanous Breathing and Patient connected to nasal cannula oxygen ? ?Post-op Assessment: Report given to RN and Post -op Vital signs reviewed and stable ? ?Post vital signs: Reviewed and stable ? ?Last Vitals:  ?Vitals Value Taken Time  ?BP 128/66 01/15/22 1325  ?Temp    ?Pulse 81 01/15/22 1327  ?Resp 9 01/15/22 1327  ?SpO2 100 % 01/15/22 1327  ?Vitals shown include unvalidated device data. ? ?Last Pain: There were no vitals filed for this visit.   ? ?Patients Stated Pain Goal: 7 (01/15/22 1147) ? ?Complications: No notable events documented. ?

## 2022-01-16 LAB — PREPARE PLATELET PHERESIS: Unit division: 0

## 2022-01-16 LAB — BPAM PLATELET PHERESIS
Blood Product Expiration Date: 202303172359
ISSUE DATE / TIME: 202303161007
Unit Type and Rh: 7300

## 2022-01-17 LAB — METHYLMALONIC ACID, SERUM: Methylmalonic Acid, Quantitative: 176 nmol/L (ref 0–378)

## 2022-01-19 ENCOUNTER — Other Ambulatory Visit (HOSPITAL_COMMUNITY): Payer: BC Managed Care – PPO

## 2022-01-19 ENCOUNTER — Encounter (HOSPITAL_COMMUNITY): Payer: Self-pay | Admitting: Urology

## 2022-01-24 NOTE — Progress Notes (Signed)
Start ? ?[facility] ?[address] ?[place], [place] [address] ? ? ?CLINIC:  ?Medical Oncology/Hematology ? ?PCP:  ?Lindell Spar, MD ?[address] ?[place] [address] ?[phone] ? ? ?REASON FOR VISIT:  ?Follow-up for cirrhosis related pancytopenia ?  ?PRIOR THERAPY: Platelet transfusion x2 (prior to prostate biopsy and prostate SpaceOAR placement) ?  ?CURRENT THERAPY: Intermittent iron infusions (last on 09/03/2021) ? ?INTERVAL HISTORY:  ?Michael Harmon 61 y.o. male returns for routine follow-up of his cirrhosis related pancytopenia.  He was last seen by Tarri Abernethy PA-C on 10/13/2021 ? ?At today's visit, he reports feeling somewhat poorly in light of everything going on with his health.  No recent hospitalizations, surgeries, or changes in baseline health status. ? ?He reports significant fatigue as well as episodes of lightheadedness. ?He denies any pica, restless legs, headaches, or syncope. ?He has not had any chest pain or dyspnea on exertion. ?He reports easy bruising and occasional petechiae of bilateral legs. ?He reports that his gums bleed when he brushes his teeth and he has scant dried blood/epistaxis when he wakes in the morning.  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  He denies any hematuria, hematochezia, or melena. ?He has not noticed any new lumps or bumps. ?He denies any fevers, chills, or night sweats. ?He has lost about 15 pounds in the past 3 months, which he attributes to poor appetite.  He feels frustrated with his diagnosis of celiac disease reports that he "cannot eat out," and is too overwhelmed and tired all the time to cook at home.  Therefore, he "does not care to eat that much." ? ?He has 40% energy and little to no appetite. He endorses that he is maintaining a stable weight. ? ? ?REVIEW OF SYSTEMS:  ?Review of Systems  ?Constitutional:  Positive for fatigue. Negative for appetite change,  chills, diaphoresis, fever and unexpected weight change.  ?HENT:   Positive for nosebleeds (Minor). Negative for lump/mass.   ?Eyes:  Negative for eye problems.  ?Respiratory:  Positive for cough. Negative for hemoptysis and shortness of breath.   ?Cardiovascular:  Negative for chest pain, leg swelling and palpitations.  ?Gastrointestinal:  Positive for diarrhea. Negative for abdominal pain, blood in stool, constipation, nausea and vomiting.  ?Genitourinary:  Positive for frequency. Negative for hematuria.   ?Skin: Negative.   ?Neurological:  Positive for dizziness. Negative for headaches and light-headedness.  ?Hematological:  Bruises/bleeds easily.  ?Psychiatric/Behavioral:  Positive for depression and sleep disturbance. The patient is nervous/anxious.    ? ? ?PAST MEDICAL/SURGICAL HISTORY:  ?Past Medical History:  ?Diagnosis Date  ? Allergy   ? nose and sinus problems  ? Anemia   ? Asthma   ? Celiac disease   ? 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 completed Hep A and B vaccines  ? DM (diabetes mellitus) ([place])   ? GERD (gastroesophageal reflux disease)   ? Hyperlipidemia   ? diet controlled now with 100 pound weight loss  ? Hypertension   ? Iron deficiency anemia due to chronic blood loss 08/13/2021  ? Malignant neoplasm of prostate ([place]) 10/03/2021  ? Narrow angle glaucoma suspect of both eyes   ? Renal cyst 09/03/2017  ? right  ? Situational depression 07/06/2017  ? Splenomegaly   ? Thrombocytopenia ([place])   ? hematology, ?related to chol med (tricor) and glimeripide, just being monitored, above 100,000  ? ?Past Surgical History:  ?Procedure Laterality Date  ?  BIOPSY  07/09/2016  ? Procedure: BIOPSY;  Surgeon: [name], MD;  [facility];  Service: Endoscopy;;  duodenal, gastric, esophaageal  ? BIOPSY  07/31/2021  ? Procedure: BIOPSY;  Surgeon: [name], MD;  [facility];  Service: Endoscopy;;  ? CLOSED MANIPULATION SHOULDER    ? COLONOSCOPY  11/2015  ? Dr. Posey Pronto: cecal bx neg for  microscopic colitis. ascending colon polyp (adenomatous)  ? COLONOSCOPY WITH PROPOFOL N/A 07/31/2021  ? Procedure: COLONOSCOPY WITH PROPOFOL;  Surgeon: [name], MD;  [facility];  Service: Endoscopy;  Laterality: N/A;  7:30am  ? ESOPHAGEAL BANDING N/A 08/04/2017  ? Procedure: ESOPHAGEAL BANDING;  Surgeon: [name], MD;  [facility];  Service: Endoscopy;  Laterality: N/A;  esophageal varices banding  ? ESOPHAGEAL BANDING N/A 10/13/2017  ? Procedure: ESOPHAGEAL BANDING;  Surgeon: [name], MD;  [facility];  Service: Endoscopy;  Laterality: N/A;  ? ESOPHAGEAL BANDING N/A 04/09/2020  ? Procedure: ESOPHAGEAL BANDING;  Surgeon: [name], MD;  [facility];  Service: Endoscopy;  Laterality: N/A;  ? ESOPHAGOGASTRODUODENOSCOPY N/A 07/09/2016  ? Procedure: ESOPHAGOGASTRODUODENOSCOPY (EGD);  Surgeon: [name], MD;  [facility];  Service: Endoscopy;  Laterality: N/A;  730  ? ESOPHAGOGASTRODUODENOSCOPY N/A 08/04/2017  ? Procedure: ESOPHAGOGASTRODUODENOSCOPY (EGD);  Surgeon: [name], MD;  [facility];  Service: Endoscopy;  Laterality: N/A;  7:30am  ? ESOPHAGOGASTRODUODENOSCOPY N/A 10/13/2017  ? Dr. [name]: grade 2 esophageal varices status post banding, portal hypertensive gastropathy  ? ESOPHAGOGASTRODUODENOSCOPY  11/23/2019  ? [name]: Esophageal varices, 4 columns of grade 2-3 status post banding.  Varices more prominent than seen in December 2018.  Portal gastropathy.  Normal-appearing small bowel.  ? ESOPHAGOGASTRODUODENOSCOPY (EGD) WITH PROPOFOL N/A 04/09/2020  ? Dr. [name]: 4 columns of grade 2/grade 3 esophageal varices somewhat recalcitrant.  Status post esophageal band ligation today.  Portal gastropathy.  Normal-appearing small bowel.  ? ESOPHAGOGASTRODUODENOSCOPY (EGD) WITH PROPOFOL N/A 07/31/2021  ? Procedure: ESOPHAGOGASTRODUODENOSCOPY (EGD) WITH PROPOFOL;  Surgeon: [name], MD;  [facility];   Service: Endoscopy;  Laterality: N/A;  ? GOLD SEED IMPLANT N/A 01/15/2022  ? Procedure: GOLD SEED IMPLANT;  Surgeon: Cleon Gustin, MD;  Location: [facility];  Service: Urology;  Laterality: N/A;  ? SPACE OAR INSTILLATION N/A 01/15/2022  ? Procedure: SPACE OAR INSTILLATION;  Surgeon: Cleon Gustin, MD;  Location: [facility];  Service: Urology;  Laterality: N/A;  ? ? ? ?SOCIAL HISTORY:  ?Social History  ? ?Socioeconomic History  ? Marital status: Married  ?  Spouse name: Michael Harmon  ? Number of children: 0  ? Years of education: 27  ? Highest education level: Not on file  ?Occupational History  ? Occupation: Customer service manager, [employer], travels  ?Tobacco Use  ? Smoking status: Never  ? Smokeless tobacco: Never  ?Vaping Use  ? Vaping Use: Never used  ?Substance and Sexual Activity  ? Alcohol use: Not Currently  ?  Comment: rare, 1-2 drinks a month, on vacation  ? Drug use: No  ? Sexual activity: Not Currently  ?Other Topics Concern  ? Not on file  ?Social History Narrative  ? Lives at home with wife Michael Harmon  ? One dog.  ? Has no children.  ? Eats all food groups.  ? Works for [employer] and [employer].   ?   ? ?Social Determinants of Health  ? ?  Financial Resource Strain: Not on file  ?Food Insecurity: Not on file  ?Transportation Needs: Not on file  ?Physical Activity: Not on file  ?Stress: Not on file  ?Social Connections: Not on file  ?Intimate Partner Violence: Not on file  ? ? ?FAMILY HISTORY:  ?Family History  ?Problem Relation Age of Onset  ? Other Mother   ?     chronic diarrhea  ? COPD Mother   ? Heart disease Mother   ?     died at 88  ? Arthritis Mother   ? Cancer Mother   ? Depression Mother   ? Diabetes Mother   ? Hyperlipidemia Mother   ? Hypertension Mother   ? Stroke Mother   ? Arthritis Father   ? Asthma Father   ? Birth defects Father   ? Heart disease Father   ?     aortic valve replaced  ? Heart disease Maternal Grandmother   ? Stroke Maternal Grandmother   ? Heart  disease Maternal Grandfather   ? Stroke Maternal Grandfather   ? Diabetes Paternal Grandmother   ? Stroke Paternal Grandfather   ? Heart disease Paternal Grandfather   ? Liver disease Neg Hx   ? Colon cancer Neg Hx   ? ? ?

## 2022-01-26 ENCOUNTER — Encounter: Payer: BC Managed Care – PPO | Admitting: Urology

## 2022-01-26 ENCOUNTER — Inpatient Hospital Stay (HOSPITAL_BASED_OUTPATIENT_CLINIC_OR_DEPARTMENT_OTHER): Payer: BC Managed Care – PPO | Admitting: Physician Assistant

## 2022-01-26 ENCOUNTER — Other Ambulatory Visit: Payer: Self-pay

## 2022-01-26 ENCOUNTER — Encounter: Payer: Self-pay | Admitting: Urology

## 2022-01-26 ENCOUNTER — Ambulatory Visit (INDEPENDENT_AMBULATORY_CARE_PROVIDER_SITE_OTHER): Payer: BC Managed Care – PPO | Admitting: Urology

## 2022-01-26 VITALS — BP 121/52 | HR 68 | Temp 97.2°F | Resp 18 | Ht 67.0 in | Wt 201.3 lb

## 2022-01-26 VITALS — BP 139/52 | HR 77

## 2022-01-26 DIAGNOSIS — D500 Iron deficiency anemia secondary to blood loss (chronic): Secondary | ICD-10-CM | POA: Diagnosis not present

## 2022-01-26 DIAGNOSIS — D61818 Other pancytopenia: Secondary | ICD-10-CM | POA: Diagnosis not present

## 2022-01-26 DIAGNOSIS — D696 Thrombocytopenia, unspecified: Secondary | ICD-10-CM | POA: Diagnosis not present

## 2022-01-26 DIAGNOSIS — D708 Other neutropenia: Secondary | ICD-10-CM | POA: Diagnosis not present

## 2022-01-26 DIAGNOSIS — C61 Malignant neoplasm of prostate: Secondary | ICD-10-CM

## 2022-01-26 DIAGNOSIS — D649 Anemia, unspecified: Secondary | ICD-10-CM

## 2022-01-26 LAB — URINALYSIS, ROUTINE W REFLEX MICROSCOPIC
Bilirubin, UA: NEGATIVE
Ketones, UA: NEGATIVE
Leukocytes,UA: NEGATIVE
Nitrite, UA: NEGATIVE
Specific Gravity, UA: 1.02 (ref 1.005–1.030)
Urobilinogen, Ur: 0.2 mg/dL (ref 0.2–1.0)
pH, UA: 6.5 (ref 5.0–7.5)

## 2022-01-26 LAB — MICROSCOPIC EXAMINATION
Epithelial Cells (non renal): >10 /hpf — AB (ref 0–10)
Renal Epithel, UA: NONE SEEN /hpf

## 2022-01-26 NOTE — Progress Notes (Signed)
? ?01/26/2022 ?11:58 AM  ? ?Michael Harmon ?[birth year] ?[id number] ? ?Referring provider: Lindell Spar, MD ?[address] ?[place],  [place] [address] ? ?Followup after fiducial marker placement ? ? ?HPI: ?Michael Harmon is a 60yo here for followup for fiducial marker placement and SPaceOAR. He has bleeding from his perineum which lasted several hours then stopped. No issues urinating. He had his CT simulation last week and is scheduled to see Dr. Nada Boozer next week to start radiation. He denies nay LUTS.  ? ? ?PMH: ?Past Medical History:  ?Diagnosis Date  ? Allergy   ? nose and sinus problems  ? Anemia   ? Asthma   ? Celiac disease   ? 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 completed Hep A and B vaccines  ? DM (diabetes mellitus) ([place])   ? GERD (gastroesophageal reflux disease)   ? Hyperlipidemia   ? diet controlled now with 100 pound weight loss  ? Hypertension   ? Iron deficiency anemia due to chronic blood loss 08/13/2021  ? Malignant neoplasm of prostate ([place]) 10/03/2021  ? Narrow angle glaucoma suspect of both eyes   ? Renal cyst 09/03/2017  ? right  ? Situational depression 07/06/2017  ? Splenomegaly   ? Thrombocytopenia ([place])   ? hematology, ?related to chol med (tricor) and glimeripide, just being monitored, above 100,000  ? ? ?Surgical History: ?Past Surgical History:  ?Procedure Laterality Date  ? BIOPSY  07/09/2016  ? Procedure: BIOPSY;  Surgeon: [name], MD;  [facility];  Service: Endoscopy;;  duodenal, gastric, esophaageal  ? BIOPSY  07/31/2021  ? Procedure: BIOPSY;  Surgeon: [name], MD;  [facility];  Service: Endoscopy;;  ? CLOSED MANIPULATION SHOULDER    ? COLONOSCOPY  11/2015  ? Dr. Posey Pronto: cecal bx neg for microscopic colitis. ascending colon polyp (adenomatous)  ? COLONOSCOPY WITH PROPOFOL N/A 07/31/2021  ? Procedure: COLONOSCOPY WITH PROPOFOL;  Surgeon: [name], MD;  [facility];  Service: Endoscopy;  Laterality: N/A;  7:30am  ? ESOPHAGEAL BANDING N/A  08/04/2017  ? Procedure: ESOPHAGEAL BANDING;  Surgeon: [name], MD;  [facility];  Service: Endoscopy;  Laterality: N/A;  esophageal varices banding  ? ESOPHAGEAL BANDING N/A 10/13/2017  ? Procedure: ESOPHAGEAL BANDING;  Surgeon: [name], MD;  [facility];  Service: Endoscopy;  Laterality: N/A;  ? ESOPHAGEAL BANDING N/A 04/09/2020  ? Procedure: ESOPHAGEAL BANDING;  Surgeon: [name], MD;  [facility];  Service: Endoscopy;  Laterality: N/A;  ? ESOPHAGOGASTRODUODENOSCOPY N/A 07/09/2016  ? Procedure: ESOPHAGOGASTRODUODENOSCOPY (EGD);  Surgeon: [name], MD;  [facility];  Service: Endoscopy;  Laterality: N/A;  730  ? ESOPHAGOGASTRODUODENOSCOPY N/A 08/04/2017  ? Procedure: ESOPHAGOGASTRODUODENOSCOPY (EGD);  Surgeon: [name], MD;  [facility];  Service: Endoscopy;  Laterality: N/A;  7:30am  ? ESOPHAGOGASTRODUODENOSCOPY N/A 10/13/2017  ? Dr. [name]: grade 2 esophageal varices status post banding, portal hypertensive gastropathy  ? ESOPHAGOGASTRODUODENOSCOPY  11/23/2019  ? [name]: Esophageal varices, 4 columns of grade 2-3 status post banding.  Varices more prominent than seen in December 2018.  Portal gastropathy.  Normal-appearing small bowel.  ? ESOPHAGOGASTRODUODENOSCOPY (EGD) WITH PROPOFOL N/A 04/09/2020  ? Dr. [name]: 4 columns of grade 2/grade 3 esophageal varices somewhat recalcitrant.  Status post esophageal band ligation today.  Portal gastropathy.  Normal-appearing small bowel.  ? ESOPHAGOGASTRODUODENOSCOPY (EGD) WITH PROPOFOL N/A 07/31/2021  ? Procedure: ESOPHAGOGASTRODUODENOSCOPY (  EGD) WITH PROPOFOL;  Surgeon: [name], MD;  [facility];  Service: Endoscopy;  Laterality: N/A;  ? GOLD SEED IMPLANT N/A 01/15/2022  ? Procedure: GOLD SEED IMPLANT;  Surgeon: Cleon Gustin, MD;  [facility];  Service: Urology;  Laterality: N/A;  ? SPACE OAR INSTILLATION N/A 01/15/2022  ? Procedure: SPACE OAR INSTILLATION;   Surgeon: Cleon Gustin, MD;  Location: [facility];  Service: Urology;  Laterality: N/A;  ? ? ?Home Medications:  ?Allergies as of 01/26/2022   ? ?   Reactions  ? Penicillins Other (See Comments)  ? As a baby, unknown reaction ?Did it involve swelling of the face/tongue/throat, SOB, or low BP? Unknown ?Did it involve sudden or severe rash/hives, skin peeling, or any reaction on the inside of your mouth or nose? Unknown ?Did you need to seek medical attention at a hospital or doctor's office? Unknown ?When did it last happen?      infant allergy ?If all above answers are ?NO?, may proceed with cephalosporin use. ?.  ? Gluten Meal Diarrhea  ? Biaxin [clarithromycin] Tinitus  ? Ears ringing, loss sense of smell  ? Prednisone Hives  ? Made blood sugar high  ? ?  ? ?  ?Medication List  ?  ? ?  ? Accurate as of January 26, 2022 11:58 AM. If you have any questions, ask your nurse or doctor.  ?  ?  ? ?  ? ?acetaminophen 325 MG tablet ?Commonly known as: TYLENOL ?Take 650 mg by mouth every 6 (six) hours as needed for moderate pain. ?  ?atorvastatin 20 MG tablet ?Commonly known as: LIPITOR ?Take 1 tablet (20 mg total) by mouth daily. ?What changed: when to take this ?  ?BD Pen Needle Nano 2nd Gen 32G X 4 MM Misc ?Generic drug: Insulin Pen Needle ?USE TO INJECT INSULIN 4 TIMES DAILY E11.65 ?  ?benazepril-hydrochlorthiazide 10-12.5 MG tablet ?Commonly known as: LOTENSIN HCT ?Take 1 tablet by mouth in the morning. ?  ?FreeStyle [place] 2 Reader Kerrin Mo ?USE AS DIRECTED TO TEST GLUCOSE LEVELS AT LEAST 4 TIMES DAILY E11.9 ?  ?FreeStyle Libre 2 Sensor Misc ?USE AS DIRECTED FOR BLOOD SUGAR MONITORING. CHANGE EVERY 14 DAYS ?  ?HumaLOG KwikPen 100 UNIT/ML KwikPen ?Generic drug: insulin lispro ?Inject 10-16 Units into the skin with breakfast, with lunch, and with evening meal. ?  ?Levemir FlexTouch 100 UNIT/ML FlexPen ?Generic drug: insulin detemir ?Inject 36 Units into the skin at bedtime. ?  ?multivitamin with minerals Tabs tablet ?Take 1  tablet by mouth in the morning. ?  ?traMADol 50 MG tablet ?Commonly known as: Ultram ?Take 1 tablet (50 mg total) by mouth every 6 (six) hours as needed. ?  ?Vitamin D3 125 MCG (5000 UT) Tabs ?Take 5,000 Units by mouth in the morning. ?  ? ?  ? ? ?Allergies:  ?Allergies  ?Allergen Reactions  ? Penicillins Other (See Comments)  ?  As a baby, unknown reaction ?Did it involve swelling of the face/tongue/throat, SOB, or low BP? Unknown ?Did it involve sudden or severe rash/hives, skin peeling, or any reaction on the inside of your mouth or nose? Unknown ?Did you need to seek medical attention at a hospital or doctor's office? Unknown ?When did it last happen?      infant allergy ?If all above answers are ?NO?, may proceed with cephalosporin use. ?. ?  ? Gluten Meal Diarrhea  ? Biaxin [Clarithromycin] Tinitus  ?  Ears ringing, loss sense of smell  ?  Prednisone Hives  ?  Made blood sugar high  ? ? ?Family History: ?Family History  ?Problem Relation Age of Onset  ? Other Mother   ?     chronic diarrhea  ? COPD Mother   ? Heart disease Mother   ?     died at 66  ? Arthritis Mother   ? Cancer Mother   ? Depression Mother   ? Diabetes Mother   ? Hyperlipidemia Mother   ? Hypertension Mother   ? Stroke Mother   ? Arthritis Father   ? Asthma Father   ? Birth defects Father   ? Heart disease Father   ?     aortic valve replaced  ? Heart disease Maternal Grandmother   ? Stroke Maternal Grandmother   ? Heart disease Maternal Grandfather   ? Stroke Maternal Grandfather   ? Diabetes Paternal Grandmother   ? Stroke Paternal Grandfather   ? Heart disease Paternal Grandfather   ? Liver disease Neg Hx   ? Colon cancer Neg Hx   ? ? ?Social History:  reports that he has never smoked. He has never used smokeless tobacco. He reports that he does not currently use alcohol. He reports that he does not use drugs. ? ?ROS: ?All other review of systems were reviewed and are negative except what is noted above in HPI ? ?Physical Exam: ?BP (!)  139/52   Pulse 77   ?Constitutional:  Alert and oriented, No acute distress. ?HEENT: [place] AT, moist mucus membranes.  Trachea midline, no masses. ?Cardiovascular: No clubbing, cyanosis, or edema. ?Respiratory: Nor

## 2022-01-26 NOTE — Patient Instructions (Signed)
Prostate Cancer °The prostate is a small gland that helps make semen. It is located below a man's bladder, in front of the rectum. Prostate cancer is when abnormal cells grow in this gland. °What are the causes? °The cause of this condition is not known. °What increases the risk? °Being age 62 or older. °Having a family history of prostate cancer. °Having a family history of cancer of the breasts or ovaries. °Having genes that are passed from parent to child (inherited). °Having Lynch syndrome. °African American men and men of African descent are diagnosed with prostate cancer at higher rates than other men. °What are the signs or symptoms? °Problems peeing (urinating). This may include: °A stream that is weak, or pee that stops and starts. °Trouble starting or stopping your pee. °Trouble emptying all of your pee. °Needing to pee more often, especially at night. °Blood in your pee or semen. °Pain in the: °Lower back. °Lower belly (abdomen). °Hips. °Trouble getting an erection. °Weakness or numbness in the legs or feet. °How is this treated? °Treatment for this condition depends on: °How much the cancer has spread. °Your age. °The kind of treatment you want. °Your health. °Treatments include: °Being watched. This is called observation. You will be tested from time to time, but you will not get treated. Tests are to make sure that the cancer is not growing. °Surgery. This may be done to: °Take out (remove) the prostate. °Freeze and kill cancer cells. °Radiation. This uses a strong beam of energy to kill cancer cells. °Chemotherapy. This uses medicines that stop cancer cells from increasing. This kills cancer cells and healthy cells. °Targeted therapy. This kills cancer cells only. Healthy cells are not affected. °Hormone treatment. This stops the body from making hormones that help the cancer cells grow. °Follow these instructions at home: °Lifestyle °Do not smoke or use any products that contain nicotine or tobacco.  If you need help quitting, ask your doctor. °Eat a healthy diet. °Treatment may affect your ability to have sex. If you have a partner, touch, hold, hug, and caress your partner to have intimate moments. °Get plenty of sleep. °Ask your doctor for help to find a support group for men with prostate cancer. °General instructions °Take over-the-counter and prescription medicines only as told by your doctor. °If you have to go to the hospital, let your cancer doctor (oncologist) know. °Keep all follow-up visits. °Where to find more information °American Cancer Society: www.cancer.org °American Society of Clinical Oncology: www.cancer.net °National Cancer Institute: www.cancer.gov °Contact a doctor if: °You have new or more trouble peeing. °You have new or more blood in your pee. °You have new or more pain in your hips, back, or chest. °Get help right away if: °You have weakness in your legs. °You lose feeling in your legs. °You cannot control your pee or your poop (stool). °You have chills or a fever. °Summary °The prostate is a male gland that helps make semen. °Prostate cancer is when abnormal cells grow in this gland. °Treatment includes doing surgery, using medicines, using strong beams of energy, or watching without treatment. °Ask your doctor for help to find a support group for men with prostate cancer. °Contact a doctor if you have problems peeing or have any new pain that you did not have before. °This information is not intended to replace advice given to you by your health care provider. Make sure you discuss any questions you have with your health care provider. °Document Revised: 01/15/2021 Document Reviewed: 01/15/2021 °Elsevier   Patient Education © 2022 Elsevier Inc. ° °

## 2022-01-26 NOTE — Patient Instructions (Signed)
[place] at [facility] ?Discharge Instructions ? ?You were seen today by Tarri Abernethy PA-C for your low platelets and your anemia, which is most likely due to your cirrhosis.   ? ?LOW PLATELETS: We will schedule you for a bone marrow biopsy to make sure there is nothing else going on causing your worsening low platelets.  You will not need a platelet transfusion prior to this procedure, since it can be safely performed as long as your platelets are higher than 20,000. ? ?ANEMIA: Your iron levels are good, but some of your folic acid labs are slightly abnormal.  Recommend that you start taking folic acid 700 mcg daily (available over-the-counter). ? ?WEIGHT LOSS: This is likely secondary to poor appetite and malnutrition.  We will have our nutritionist call you for a phone consultation. ? ?FOLLOW-UP APPOINTMENT: Same-day labs and office visit in 4 to 6 weeks, after bone marrow biopsy to discuss results. ? ? ?Thank you for choosing [place] at [facility] to provide your oncology and hematology care.  To afford each patient quality time with our provider, please arrive at least 15 minutes before your scheduled appointment time.  ? ?If you have a lab appointment with the [facility] please come in thru the Main Entrance and check in at the main information desk. ? ?You need to re-schedule your appointment should you arrive 10 or more minutes late.  We strive to give you quality time with our providers, and arriving late affects you and other patients whose appointments are after yours.  Also, if you no show three or more times for appointments you may be dismissed from the clinic at the providers discretion.     ?Again, thank you for choosing [facility].  Our hope is that these requests will decrease the amount of time that you wait before being seen by our physicians.       ?_____________________________________________________________ ? ?Should  you have questions after your visit to [facility], please contact our office at [phone] and follow the prompts.  Our office hours are 8:00 a.m. and 4:30 p.m. Monday - Friday.  Please note that voicemails left after 4:00 p.m. may not be returned until the following business day.  We are closed weekends and major holidays.  You do have access to a nurse 24-7, just call the main number to the clinic [phone] and do not press any options, hold on the line and a nurse will answer the phone.   ? ?For prescription refill requests, have your pharmacy contact our office and allow 72 hours.   ? ?Due to Covid, you will need to wear a mask upon entering the hospital. If you do not have a mask, a mask will be given to you at the Main Entrance upon arrival. For doctor visits, patients may have 1 support person age 8 or older with them. For treatment visits, patients can not have anyone with them due to social distancing guidelines and our immunocompromised population.  ? ? ? ?

## 2022-02-02 ENCOUNTER — Encounter (HOSPITAL_COMMUNITY): Payer: BC Managed Care – PPO | Admitting: Dietician

## 2022-02-02 ENCOUNTER — Inpatient Hospital Stay (HOSPITAL_COMMUNITY): Payer: BC Managed Care – PPO | Attending: Hematology | Admitting: Dietician

## 2022-02-02 ENCOUNTER — Telehealth (HOSPITAL_COMMUNITY): Payer: Self-pay | Admitting: Dietician

## 2022-02-02 DIAGNOSIS — D61818 Other pancytopenia: Secondary | ICD-10-CM | POA: Insufficient documentation

## 2022-02-02 NOTE — Telephone Encounter (Signed)
Nutrition Assessment ? ? ?Reason for Assessment: weight loss, poor appetite  ? ? ?ASSESSMENT: 61 year old male with cirrhosis related pancytopenia s/p platelet transfusion x 2. Patient is receiving intermittent iron infusions (last on 09/03/21). Patient with newly diagnosed prostate cancer and is s/p gold seed implant/spaceOAR on 3/16. He is pending start of radiation with Dr. Nada Boozer.  ? ?Past medical history includes celiac disease, cryptogenic cirrhosis, DM, GERD, HLD, HTN, IDA d/t chronic blood loss, splenomegaly ? ?Spoke with patient via telephone. He reports poor appetite, but is forcing himself to eat. Patient reports diagnosed with celiac's ~5 years ago. He has become "more sensitive" and unable to eat out. Patient reports significant bouts of diarrhea and becoming "deathly ill" despite ordering gluten free foods (baked potatoes, salads) when secondary to cross contamination. He has been eating all meals at home. Patient usually will have bowl of cereal with lactate milk or piece of buttered toast for breakfast. He often skips lunch meal but will eat bag of Cheetos. Patient eats a variety of foods for supper (GF pizza, [place] sandwich/soup, grilled chicken, white fish) Last night he had steak, spinach, and baked potato with butter/sour cream.  ? ?Nutrition Focused Physical Exam: unable to complete ? ? ?Medications: tramadol, D3, MVI, Levemir ? ? ?Labs: reviewed  ? ? ?Anthropometrics: Weights have decreased 4.7% (10 lbs) in the last month. Patient weighed 211 lb 6.4 oz on 12/22/21 ? ?Height: 5'7" ?Weight: 201 lb 4.5 oz (3/27) ?UBW: 219-221 (Oct 2022) ?BMI: 31.52 ? ? ? ?NUTRITION DIAGNOSIS: Unintentional weight loss related to chronic illness (celiac's disease) as evidenced by reported decreased appetite, diarrhea, 8.6% decrease from usual weight in the last 5 months; insignificant for time frame   ? ? ? ?INTERVENTION:  ?Discussed importance of adequate calories and protein to maintain strength/weights ?Encouraged  packing a lunch to take with him during the week ?Discussed strategies for adding calories and protein to foods ?Will leave samples of [place] as well as handouts at registration desk for pick up on 4/11 - pt aware ? ? ?MONITORING, EVALUATION, GOAL: Patient will tolerate adequate calories and protein to minimize weight loss  ? ? ?Next Visit: via telephone ~4 weeks ? ? ? ? ? ? ?

## 2022-02-03 ENCOUNTER — Encounter: Payer: BC Managed Care – PPO | Admitting: Dietician

## 2022-02-03 ENCOUNTER — Ambulatory Visit (HOSPITAL_COMMUNITY): Payer: BC Managed Care – PPO

## 2022-02-05 ENCOUNTER — Ambulatory Visit (HOSPITAL_COMMUNITY): Payer: BC Managed Care – PPO

## 2022-02-09 ENCOUNTER — Other Ambulatory Visit: Payer: Self-pay | Admitting: Radiology

## 2022-02-09 ENCOUNTER — Ambulatory Visit (HOSPITAL_COMMUNITY): Payer: BC Managed Care – PPO

## 2022-02-10 ENCOUNTER — Ambulatory Visit (HOSPITAL_COMMUNITY)
Admission: RE | Admit: 2022-02-10 | Discharge: 2022-02-10 | Disposition: A | Discharge status: Home / Self Care | Payer: BC Managed Care – PPO | Source: Ambulatory Visit | Attending: Physician Assistant | Admitting: Physician Assistant

## 2022-02-10 ENCOUNTER — Encounter (HOSPITAL_COMMUNITY): Payer: Self-pay

## 2022-02-10 ENCOUNTER — Other Ambulatory Visit: Payer: Self-pay

## 2022-02-10 DIAGNOSIS — D509 Iron deficiency anemia, unspecified: Secondary | ICD-10-CM | POA: Diagnosis not present

## 2022-02-10 DIAGNOSIS — E119 Type 2 diabetes mellitus without complications: Secondary | ICD-10-CM | POA: Diagnosis not present

## 2022-02-10 DIAGNOSIS — K7469 Other cirrhosis of liver: Secondary | ICD-10-CM | POA: Diagnosis not present

## 2022-02-10 DIAGNOSIS — I10 Essential (primary) hypertension: Secondary | ICD-10-CM | POA: Diagnosis not present

## 2022-02-10 DIAGNOSIS — J45909 Unspecified asthma, uncomplicated: Secondary | ICD-10-CM | POA: Insufficient documentation

## 2022-02-10 DIAGNOSIS — K219 Gastro-esophageal reflux disease without esophagitis: Secondary | ICD-10-CM | POA: Diagnosis not present

## 2022-02-10 DIAGNOSIS — Z8546 Personal history of malignant neoplasm of prostate: Secondary | ICD-10-CM | POA: Diagnosis not present

## 2022-02-10 DIAGNOSIS — D709 Neutropenia, unspecified: Secondary | ICD-10-CM | POA: Insufficient documentation

## 2022-02-10 DIAGNOSIS — K900 Celiac disease: Secondary | ICD-10-CM | POA: Diagnosis not present

## 2022-02-10 DIAGNOSIS — E785 Hyperlipidemia, unspecified: Secondary | ICD-10-CM | POA: Insufficient documentation

## 2022-02-10 DIAGNOSIS — R161 Splenomegaly, not elsewhere classified: Secondary | ICD-10-CM | POA: Diagnosis not present

## 2022-02-10 DIAGNOSIS — D61818 Other pancytopenia: Secondary | ICD-10-CM | POA: Insufficient documentation

## 2022-02-10 DIAGNOSIS — K7460 Unspecified cirrhosis of liver: Secondary | ICD-10-CM | POA: Diagnosis present

## 2022-02-10 DIAGNOSIS — D696 Thrombocytopenia, unspecified: Secondary | ICD-10-CM

## 2022-02-10 LAB — CBC WITH DIFFERENTIAL/PLATELET
Abs Immature Granulocytes: 0.01 10*3/uL (ref 0.00–0.07)
Basophils Absolute: 0 10*3/uL (ref 0.0–0.1)
Basophils Relative: 1 %
Eosinophils Absolute: 0.3 10*3/uL (ref 0.0–0.5)
Eosinophils Relative: 10 %
HCT: 28.2 % — ABNORMAL LOW (ref 39.0–52.0)
Hemoglobin: 9.5 g/dL — ABNORMAL LOW (ref 13.0–17.0)
Immature Granulocytes: 0 %
Lymphocytes Relative: 34 %
Lymphs Abs: 0.8 10*3/uL (ref 0.7–4.0)
MCH: 33.7 pg (ref 26.0–34.0)
MCHC: 33.7 g/dL (ref 30.0–36.0)
MCV: 100 fL (ref 80.0–100.0)
Monocytes Absolute: 0.3 10*3/uL (ref 0.1–1.0)
Monocytes Relative: 12 %
Neutro Abs: 1 10*3/uL — ABNORMAL LOW (ref 1.7–7.7)
Neutrophils Relative %: 43 %
Platelets: 44 10*3/uL — ABNORMAL LOW (ref 150–400)
RBC: 2.82 MIL/uL — ABNORMAL LOW (ref 4.22–5.81)
RDW: 13.8 % (ref 11.5–15.5)
WBC: 2.4 10*3/uL — ABNORMAL LOW (ref 4.0–10.5)
nRBC: 0 % (ref 0.0–0.2)

## 2022-02-10 LAB — GLUCOSE, CAPILLARY: Glucose-Capillary: 101 mg/dL — ABNORMAL HIGH (ref 70–99)

## 2022-02-10 MED ORDER — FENTANYL CITRATE (PF) 100 MCG/2ML IJ SOLN
INTRAMUSCULAR | Status: AC
  Filled 2022-02-10: qty 2

## 2022-02-10 MED ORDER — MIDAZOLAM HCL 2 MG/2ML IJ SOLN
INTRAMUSCULAR | Status: AC
  Filled 2022-02-10: qty 4

## 2022-02-10 MED ORDER — FENTANYL CITRATE (PF) 100 MCG/2ML IJ SOLN
INTRAMUSCULAR | Status: AC | PRN
  Administered 2022-02-10 (×2): 50 ug via INTRAVENOUS

## 2022-02-10 MED ORDER — SODIUM CHLORIDE 0.9 % IV SOLN: INTRAVENOUS | Status: DC

## 2022-02-10 MED ORDER — MIDAZOLAM HCL 2 MG/2ML IJ SOLN
INTRAMUSCULAR | Status: AC | PRN
Start: 2022-02-10 — End: 2022-02-10
  Administered 2022-02-10: 2 mg via INTRAVENOUS
  Administered 2022-02-10 (×2): 1 mg via INTRAVENOUS

## 2022-02-10 NOTE — Procedures (Signed)
Interventional Radiology Procedure:   Indications: Thrombocytopenia  Procedure: CT guided bone marrow biopsy  Findings: 2 aspirates and 1 core from right ilium  Complications: None     EBL: Minimal, less than 10 ml  Plan: Discharge to home in one hour.   Ibrahim Mcpheeters R. Lilyannah Zuelke, MD  Pager: [phone]   

## 2022-02-10 NOTE — Consult Note (Signed)
? ?Chief Complaint: ?Patient was seen in consultation today for CT-guided bone marrow biopsy ? ?Referring Physician(s): ?Pennington,Rebekah M ? ?Supervising Physician: Markus Daft ? ?Patient Status: [place] ? ?History of Present Illness: ?Michael Harmon is a 61 y.o. male with past medical history significant for asthma, celiac disease, cryptogenic cirrhosis, diabetes, GERD, hyperlipidemia, hypertension, iron deficiency anemia, depression, cirrhosis, splenomegaly, and persistent thrombocytopenia of uncertain etiology.  He is also neutropenic.  He presents today for CT-guided bone marrow biopsy for further evaluation. ? ?Past Medical History:  ?Diagnosis Date  ? Allergy   ? nose and sinus problems  ? Anemia   ? Asthma   ? Celiac disease   ? Cirrhosis, cryptogenic ([place])   ? completed Hep A and B vaccines  ? DM (diabetes mellitus) ([place])   ? GERD (gastroesophageal reflux disease)   ? Hyperlipidemia   ? diet controlled now with 100 pound weight loss  ? Hypertension   ? Iron deficiency anemia due to chronic blood loss 08/13/2021  ? Malignant neoplasm of prostate ([place]) 10/03/2021  ? Narrow angle glaucoma suspect of both eyes   ? Renal cyst 09/03/2017  ? right  ? Situational depression 07/06/2017  ? Splenomegaly   ? Thrombocytopenia ([place])   ? hematology, ?related to chol med (tricor) and glimeripide, just being monitored, above 100,000  ? ? ?Past Surgical History:  ?Procedure Laterality Date  ? BIOPSY  07/09/2016  ? Procedure: BIOPSY;  Surgeon: [name], MD;  [facility];  Service: Endoscopy;;  duodenal, gastric, esophaageal  ? BIOPSY  07/31/2021  ? Procedure: BIOPSY;  Surgeon: [name], MD;  [facility];  Service: Endoscopy;;  ? CLOSED MANIPULATION SHOULDER    ? COLONOSCOPY  11/2015  ? Dr. Posey Pronto: cecal bx neg for microscopic colitis. ascending colon polyp (adenomatous)  ? COLONOSCOPY WITH PROPOFOL N/A 07/31/2021  ? Procedure: COLONOSCOPY WITH PROPOFOL;  Surgeon: [name], MD;   [facility];  Service: Endoscopy;  Laterality: N/A;  7:30am  ? ESOPHAGEAL BANDING N/A 08/04/2017  ? Procedure: ESOPHAGEAL BANDING;  Surgeon: [name], MD;  [facility];  Service: Endoscopy;  Laterality: N/A;  esophageal varices banding  ? ESOPHAGEAL BANDING N/A 10/13/2017  ? Procedure: ESOPHAGEAL BANDING;  Surgeon: [name], MD;  [facility];  Service: Endoscopy;  Laterality: N/A;  ? ESOPHAGEAL BANDING N/A 04/09/2020  ? Procedure: ESOPHAGEAL BANDING;  Surgeon: [name], MD;  [facility];  Service: Endoscopy;  Laterality: N/A;  ? ESOPHAGOGASTRODUODENOSCOPY N/A 07/09/2016  ? Procedure: ESOPHAGOGASTRODUODENOSCOPY (EGD);  Surgeon: [name], MD;  [facility];  Service: Endoscopy;  Laterality: N/A;  730  ? ESOPHAGOGASTRODUODENOSCOPY N/A 08/04/2017  ? Procedure: ESOPHAGOGASTRODUODENOSCOPY (EGD);  Surgeon: [name], MD;  [facility];  Service: Endoscopy;  Laterality: N/A;  7:30am  ? ESOPHAGOGASTRODUODENOSCOPY N/A 10/13/2017  ? Dr. [name]: grade 2 esophageal varices status post banding, portal hypertensive gastropathy  ? ESOPHAGOGASTRODUODENOSCOPY  11/23/2019  ? [name]: Esophageal varices, 4 columns of grade 2-3 status post banding.  Varices more prominent than seen in December 2018.  Portal gastropathy.  Normal-appearing small bowel.  ? ESOPHAGOGASTRODUODENOSCOPY (EGD) WITH PROPOFOL N/A 04/09/2020  ? Dr. [name]: 4 columns of grade 2/grade 3 esophageal varices somewhat recalcitrant.  Status post esophageal band ligation today.  Portal gastropathy.  Normal-appearing small bowel.  ? ESOPHAGOGASTRODUODENOSCOPY (EGD) WITH PROPOFOL N/A 07/31/2021  ? Procedure: ESOPHAGOGASTRODUODENOSCOPY (EGD) WITH PROPOFOL;  Surgeon:  [name], [name], MD;  [facility];  Service: Endoscopy;  Laterality: N/A;  ? GOLD SEED IMPLANT N/A 01/15/2022  ? Procedure: GOLD SEED IMPLANT;  Surgeon: Cleon Gustin, MD;  Location: [facility];  Service:  Urology;  Laterality: N/A;  ? SPACE OAR INSTILLATION N/A 01/15/2022  ? Procedure: SPACE OAR INSTILLATION;  Surgeon: Cleon Gustin, MD;  Location: [facility];  Service: Urology;  Laterality: N/A;  ? ? ?Allergies: ?Penicillins, Gluten meal, Biaxin [clarithromycin], and Prednisone ? ?Medications: ?Prior to Admission medications   ?Medication Sig Start Date End Date Taking? Authorizing Provider  ?acetaminophen (TYLENOL) 325 MG tablet Take 650 mg by mouth every 6 (six) hours as needed for moderate pain.   Yes [provider]  ?atorvastatin (LIPITOR) 20 MG tablet Take 1 tablet (20 mg total) by mouth daily. ?Patient taking differently: Take 20 mg by mouth in the morning. 07/21/18  Yes Hagler, Apolonio Schneiders, MD  ?BD PEN NEEDLE NANO 2ND GEN 32G X 4 MM MISC USE TO INJECT INSULIN 4 TIMES DAILY E11.65 08/06/21  Yes [provider]  ?benazepril-hydrochlorthiazide (LOTENSIN HCT) 10-12.5 MG tablet Take 1 tablet by mouth in the morning.   Yes [provider]  ?Cholecalciferol (VITAMIN D3) 125 MCG (5000 UT) TABS Take 5,000 Units by mouth in the morning.   Yes [provider]  ?Continuous Blood Gluc Receiver (FREESTYLE LIBRE 2 READER) DEVI USE AS DIRECTED TO TEST GLUCOSE LEVELS AT LEAST 4 TIMES DAILY E11.9 09/29/21  Yes [provider]  ?Continuous Blood Gluc Sensor (FREESTYLE LIBRE 2 SENSOR) MISC USE AS DIRECTED FOR BLOOD SUGAR MONITORING. CHANGE EVERY 14 DAYS 08/10/21  Yes [provider]  ?Cleda Clarks 100 UNIT/ML KwikPen Inject 10-16 Units into the skin with breakfast, with lunch, and with evening meal. 06/10/21  Yes [provider]  ?LEVEMIR FLEXTOUCH 100 UNIT/ML Pen Inject 36 Units into the skin at bedtime.  12/25/18  Yes [provider]  ?Multiple Vitamin (MULTIVITAMIN WITH MINERALS) TABS tablet Take 1 tablet by mouth in the morning.   Yes [provider]  ?traMADol (ULTRAM) 50 MG tablet Take 1 tablet (50 mg total) by mouth every 6 (six) hours as needed.  01/15/22 01/15/23 Yes McKenzie, Candee Furbish, MD  ?  ? ?Family History  ?Problem Relation Age of Onset  ? Other Mother   ?     chronic diarrhea  ? COPD Mother   ? Heart disease Mother   ?     died at 52  ? Arthritis Mother   ? Cancer Mother   ? Depression Mother   ? Diabetes Mother   ? Hyperlipidemia Mother   ? Hypertension Mother   ? Stroke Mother   ? Arthritis Father   ? Asthma Father   ? Birth defects Father   ? Heart disease Father   ?     aortic valve replaced  ? Heart disease Maternal Grandmother   ? Stroke Maternal Grandmother   ? Heart disease Maternal Grandfather   ? Stroke Maternal Grandfather   ? Diabetes Paternal Grandmother   ? Stroke Paternal Grandfather   ? Heart disease Paternal Grandfather   ? Liver disease Neg Hx   ? Colon cancer Neg Hx   ? ? ?Social History  ? ?Socioeconomic History  ? Marital status: Married  ?  Spouse name: Lenna Sciara  ? Number of children: 0  ? Years of education: 60  ? Highest education level: Not on file  ?Occupational History  ? Occupation: Visual merchandiser  technician, [place], travels  ?Tobacco Use  ? Smoking status: Never  ? Smokeless tobacco: Never  ?Vaping Use  ? Vaping Use: Never used  ?Substance and Sexual Activity  ? Alcohol use: Not Currently  ?  Comment: rare, 1-2 drinks a month, on vacation  ? Drug use: No  ? Sexual activity: Not Currently  ?Other Topics Concern  ? Not on file  ?Social History Narrative  ? Lives at home with wife Lenna Sciara  ? One dog.  ? Has no children.  ? Eats all food groups.  ? Works for [employer] and [employer].   ?   ? ?Social Determinants of Health  ? ?Financial Resource Strain: Not on file  ?Food Insecurity: Not on file  ?Transportation Needs: Not on file  ?Physical Activity: Not on file  ?Stress: Not on file  ?Social Connections: Not on file  ? ? ? ? ?Review of Systems denies fever, headache, chest pain, dyspnea, abdominal/back pain, nausea, vomiting or bleeding; he is recovering from bronchitis and has occasional cough.  He  also bruises easily. ? ?Vital Signs: ?BP (!) 127/52 (BP Location: Right Arm)   Pulse 73   Temp 97.9 ?F (36.6 ?C) (Oral)   Resp 15   Ht 5' 7"  (1.702 m)   Wt 201 lb 4.5 oz (91.3 kg)   SpO2 100%   BMI 31.52 kg/m?  ?

## 2022-02-10 NOTE — Discharge Instructions (Signed)

## 2022-02-11 ENCOUNTER — Encounter (HOSPITAL_COMMUNITY): Payer: Self-pay | Admitting: *Deleted

## 2022-02-11 NOTE — Telephone Encounter (Signed)
Was able to speak to patient regarding ascites findings.  Verbalized understanding.  Results routed to Dr. Angela Adam at [facility] for review. ?

## 2022-02-12 LAB — SURGICAL PATHOLOGY

## 2022-02-13 DIAGNOSIS — D708 Other neutropenia: Secondary | ICD-10-CM | POA: Insufficient documentation

## 2022-02-16 ENCOUNTER — Telehealth: Payer: Self-pay | Admitting: Internal Medicine

## 2022-02-16 NOTE — Telephone Encounter (Signed)
Patient already had EGD at [facility] 12/26/21. FYI to Dr. [name] ?

## 2022-02-16 NOTE — Telephone Encounter (Signed)
ON RECALL TO HAVE EGD AT [facility]  ?

## 2022-02-19 ENCOUNTER — Encounter (HOSPITAL_COMMUNITY): Payer: Self-pay | Admitting: Hematology

## 2022-02-24 ENCOUNTER — Inpatient Hospital Stay (HOSPITAL_BASED_OUTPATIENT_CLINIC_OR_DEPARTMENT_OTHER): Payer: BC Managed Care – PPO | Admitting: Hematology

## 2022-02-24 ENCOUNTER — Inpatient Hospital Stay (HOSPITAL_COMMUNITY): Payer: BC Managed Care – PPO

## 2022-02-24 VITALS — BP 114/54 | HR 65 | Temp 97.7°F | Resp 16 | Ht 67.0 in | Wt 208.0 lb

## 2022-02-24 DIAGNOSIS — D649 Anemia, unspecified: Secondary | ICD-10-CM

## 2022-02-24 DIAGNOSIS — D61818 Other pancytopenia: Secondary | ICD-10-CM | POA: Diagnosis present

## 2022-02-24 DIAGNOSIS — D696 Thrombocytopenia, unspecified: Secondary | ICD-10-CM | POA: Diagnosis not present

## 2022-02-24 NOTE — Progress Notes (Signed)
? ?[facility] ?[address] ?[place], [place] [address] ? ? ?CLINIC:  ?Medical Oncology/Hematology ? ?PCP:  ?Lindell Spar, MD ?[address] / [place] [address]  ?[phone] ? ?REASON FOR VISIT:  ?Follow-up for cirrhosis related pancytopenia ? ?PRIOR THERAPY: Platelet transfusion x2 (prior to prostate biopsy and prostate SpaceOAR placement ? ?CURRENT THERAPY: Intermittent iron infusions (last on 09/03/2021) ? ?INTERVAL HISTORY:  ?Mr. Michael Harmon, a 61 y.o. male, returns for routine follow-up for his cirrhosis related pancytopenia. Doyal was last seen on 01/26/2022. ? ?Today he reports feeling good. He denies hot flashes. He reports feeling cold frequently since starting Lupron. He denies hematochezia, black stools, and vomiting blood. He reports occasional crusted blood in his nose at night. He reports diarrhea and inflamed hemorrhoids.   ? ?REVIEW OF SYSTEMS:  ?Review of Systems  ?Constitutional:  Negative for appetite change and fatigue.  ?Gastrointestinal:  Positive for diarrhea. Negative for blood in stool.  ?Endocrine: Negative for hot flashes.  ?Genitourinary:  Positive for frequency.   ?Psychiatric/Behavioral:  Positive for sleep disturbance and suicidal ideas.   ?All other systems reviewed and are negative. ? ?PAST MEDICAL/SURGICAL HISTORY:  ?Past Medical History:  ?Diagnosis Date  ? Allergy   ? nose and sinus problems  ? Anemia   ? Asthma   ? Celiac disease   ? 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 completed Hep A and B vaccines  ? DM (diabetes mellitus) ([place])   ? GERD (gastroesophageal reflux disease)   ? Hyperlipidemia   ? diet controlled now with 100 pound weight loss  ? Hypertension   ? Iron deficiency anemia due to chronic blood loss 08/13/2021  ? Malignant neoplasm of prostate ([place]) 10/03/2021  ? Narrow angle glaucoma suspect of both eyes   ? Renal cyst 09/03/2017  ? right  ? Situational depression 07/06/2017  ? Splenomegaly   ? Thrombocytopenia ([place])   ? 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 ?Past Surgical History:  ?Procedure Laterality Date  ? BIOPSY  07/09/2016  ? Procedure: BIOPSY;  Surgeon: [name], MD;  [facility];  Service: Endoscopy;;  duodenal, gastric, esophaageal  ? BIOPSY  07/31/2021  ? Procedure: BIOPSY;  Surgeon: [name], MD;  [facility];  Service: Endoscopy;;  ? CLOSED MANIPULATION SHOULDER    ? COLONOSCOPY  11/2015  ? Dr. Posey Pronto: cecal bx neg for microscopic colitis. ascending colon polyp (adenomatous)  ? COLONOSCOPY WITH PROPOFOL N/A 07/31/2021  ? Procedure: COLONOSCOPY WITH PROPOFOL;  Surgeon: [name], MD;  [facility];  Service: Endoscopy;  Laterality: N/A;  7:30am  ? ESOPHAGEAL BANDING N/A 08/04/2017  ? Procedure: ESOPHAGEAL BANDING;  Surgeon: [name], MD;  [facility];  Service: Endoscopy;  Laterality: N/A;  esophageal varices banding  ? ESOPHAGEAL BANDING N/A 10/13/2017  ? Procedure: ESOPHAGEAL BANDING;  Surgeon: [name], MD;  [facility];  Service: Endoscopy;  Laterality: N/A;  ? ESOPHAGEAL BANDING N/A 04/09/2020  ? Procedure: ESOPHAGEAL BANDING;  Surgeon: [name], MD;  [facility];  Service: Endoscopy;  Laterality: N/A;  ? ESOPHAGOGASTRODUODENOSCOPY N/A 07/09/2016  ? Procedure: ESOPHAGOGASTRODUODENOSCOPY (EGD);  Surgeon: [name], MD;  [facility];  Service: Endoscopy;  Laterality: N/A;  730  ? ESOPHAGOGASTRODUODENOSCOPY N/A 08/04/2017  ? Procedure: ESOPHAGOGASTRODUODENOSCOPY (EGD);  Surgeon: [name], MD;  [facility];  Service: Endoscopy;  Laterality: N/A;  7:30am  ? ESOPHAGOGASTRODUODENOSCOPY N/A 10/13/2017  ? Dr. [name]: grade 2 esophageal varices status post banding, portal hypertensive gastropathy  ? ESOPHAGOGASTRODUODENOSCOPY  11/23/2019  ? [name]: Esophageal varices, 4 columns of grade 2-3 status post banding.  Varices more prominent than seen in December  2018.  Portal gastropathy.  Normal-appearing small bowel.  ? ESOPHAGOGASTRODUODENOSCOPY (EGD) WITH PROPOFOL N/A 04/09/2020  ? Dr. [name]: 4 columns of grade 2/grade 3 esophageal varices somewhat recalcitrant.  Status post esophageal band ligation today.  Portal gastropathy.  Normal-appearing small bowel.  ? ESOPHAGOGASTRODUODENOSCOPY (EGD) WITH PROPOFOL N/A 07/31/2021  ? Procedure: ESOPHAGOGASTRODUODENOSCOPY (EGD) WITH PROPOFOL;  Surgeon: [name], MD;  [facility];  Service: Endoscopy;  Laterality: N/A;  ? GOLD SEED IMPLANT N/A 01/15/2022  ? Procedure: GOLD SEED IMPLANT;  Surgeon: Cleon Gustin, MD;  Location: [facility];  Service: Urology;  Laterality: N/A;  ? SPACE OAR INSTILLATION N/A 01/15/2022  ? Procedure: SPACE OAR INSTILLATION;  Surgeon: Cleon Gustin, MD;  Location: [facility];  Service: Urology;  Laterality: N/A;  ? ? ?SOCIAL HISTORY:  ?Social History  ? ?Socioeconomic History  ? Marital status: Married  ?  Spouse name: Lenna Sciara  ? Number of children: 0  ? Years of education: 35  ? Highest education level: Not on file  ?Occupational History  ? Occupation: Customer service manager, [place], travels  ?Tobacco Use  ? Smoking status: Never  ? Smokeless tobacco: Never  ?Vaping Use  ? Vaping Use: Never used  ?Substance and Sexual Activity  ? Alcohol use: Not Currently  ?  Comment: rare, 1-2 drinks a month, on vacation  ? Drug use: No  ? Sexual activity: Not Currently  ?Other Topics Concern  ? Not on file  ?Social History Narrative  ? Lives at home with wife Lenna Sciara  ? One dog.  ? Has no children.  ? Eats all food groups.  ? Works for [employer] and [employer].   ?   ? ?Social Determinants of Health  ? ?Financial Resource Strain: Not on file  ?Food Insecurity: Not on file  ?Transportation Needs: Not on file  ?Physical Activity: Not on file  ?Stress: Not on file  ?Social Connections: Not on file  ?Intimate Partner Violence: Not on file  ? ? ?FAMILY HISTORY:  ?Family History   ?Problem Relation Age of Onset  ? Other Mother   ?     chronic diarrhea  ? COPD Mother   ? Heart disease Mother   ?     died at 66  ? Arthritis Mother   ? Cancer Mother   ? Depression Mother   ? Diabetes Mother   ? Hyperlipidemia Mother   ? Hypertension Mother   ? Stroke Mother   ? Arthritis Father   ? Asthma Father   ? Birth defects Father   ? Heart disease Father   ?     aortic valve replaced  ? Heart disease Maternal Grandmother   ? Stroke Maternal Grandmother   ? Heart disease Maternal Grandfather   ? Stroke Maternal Grandfather   ? Diabetes Paternal Grandmother   ? Stroke Paternal Grandfather   ? Heart disease Paternal Grandfather   ? Liver disease Neg Hx   ? Colon cancer Neg Hx   ? ? ?CURRENT MEDICATIONS:  ?Current Outpatient Medications  ?Medication Sig Dispense Refill  ? acetaminophen (TYLENOL) 325 MG tablet Take 650 mg by mouth every 6 (six)  hours as needed for moderate pain.    ? atorvastatin (LIPITOR) 20 MG tablet Take 1 tablet (20 mg total) by mouth daily. (Patient taking differently: Take 20 mg by mouth in the morning.) 90 tablet 1  ? BD PEN NEEDLE NANO 2ND GEN 32G X 4 MM MISC USE TO INJECT INSULIN 4 TIMES DAILY E11.65    ? benazepril-hydrochlorthiazide (LOTENSIN HCT) 10-12.5 MG tablet Take 1 tablet by mouth in the morning.    ? Cholecalciferol (VITAMIN D3) 125 MCG (5000 UT) TABS Take 5,000 Units by mouth in the morning.    ? Continuous Blood Gluc Receiver (FREESTYLE LIBRE 2 READER) DEVI USE AS DIRECTED TO TEST GLUCOSE LEVELS AT LEAST 4 TIMES DAILY E11.9    ? Continuous Blood Gluc Sensor (FREESTYLE LIBRE 2 SENSOR) MISC USE AS DIRECTED FOR BLOOD SUGAR MONITORING. CHANGE EVERY 14 DAYS    ? HUMALOG KWIKPEN 100 UNIT/ML KwikPen Inject 10-16 Units into the skin with breakfast, with lunch, and with evening meal.    ? LEVEMIR FLEXTOUCH 100 UNIT/ML Pen Inject 36 Units into the skin at bedtime.     ? Multiple Vitamin (MULTIVITAMIN WITH MINERALS) TABS tablet Take 1 tablet by mouth in the morning.    ? Tamsulosin  HCl (FLOMAX PO) Take by mouth.    ? traMADol (ULTRAM) 50 MG tablet Take 1 tablet (50 mg total) by mouth every 6 (six) hours as needed. 15 tablet 0  ? ?No current facility-administered medications for this

## 2022-02-24 NOTE — Patient Instructions (Signed)
[place] at [facility] ?Discharge Instructions ? ? ?You were seen and examined today by Dr. Delton Coombes. ? ?He reviewed the results of your bone marrow biopsy and lab work.  ? ?We will check back with you in 2 months with repeat blood work.  ? ? ? ?Thank you for choosing [place] at [facility] to provide your oncology and hematology care.  To afford each patient quality time with our provider, please arrive at least 15 minutes before your scheduled appointment time.  ? ?If you have a lab appointment with the [facility] please come in thru the Main Entrance and check in at the main information desk. ? ?You need to re-schedule your appointment should you arrive 10 or more minutes late.  We strive to give you quality time with our providers, and arriving late affects you and other patients whose appointments are after yours.  Also, if you no show three or more times for appointments you may be dismissed from the clinic at the providers discretion.     ?Again, thank you for choosing [facility].  Our hope is that these requests will decrease the amount of time that you wait before being seen by our physicians.       ?_____________________________________________________________ ? ?Should you have questions after your visit to [facility], please contact our office at [phone] and follow the prompts.  Our office hours are 8:00 a.m. and 4:30 p.m. Monday - Friday.  Please note that voicemails left after 4:00 p.m. may not be returned until the following business day.  We are closed weekends and major holidays.  You do have access to a nurse 24-7, just call the main number to the clinic [phone] and do not press any options, hold on the line and a nurse will answer the phone.   ? ?For prescription refill requests, have your pharmacy contact our office and allow 72 hours.   ? ?Due to Covid, you will need to wear a mask upon entering the  hospital. If you do not have a mask, a mask will be given to you at the Main Entrance upon arrival. For doctor visits, patients may have 1 support person age 62 or older with them. For treatment visits, patients can not have anyone with them due to social distancing guidelines and our immunocompromised population.  ? ?   ?

## 2022-02-25 ENCOUNTER — Other Ambulatory Visit (HOSPITAL_COMMUNITY): Payer: Self-pay | Admitting: Physician Assistant

## 2022-02-25 DIAGNOSIS — E7211 Homocystinuria: Secondary | ICD-10-CM

## 2022-03-02 ENCOUNTER — Encounter: Payer: Self-pay | Admitting: Internal Medicine

## 2022-03-02 ENCOUNTER — Ambulatory Visit (INDEPENDENT_AMBULATORY_CARE_PROVIDER_SITE_OTHER): Payer: BC Managed Care – PPO | Admitting: Internal Medicine

## 2022-03-02 VITALS — BP 138/56 | HR 80 | Resp 18 | Ht 67.0 in | Wt 211.6 lb

## 2022-03-02 DIAGNOSIS — E1169 Type 2 diabetes mellitus with other specified complication: Secondary | ICD-10-CM | POA: Diagnosis not present

## 2022-03-02 DIAGNOSIS — I8500 Esophageal varices without bleeding: Secondary | ICD-10-CM | POA: Diagnosis not present

## 2022-03-02 DIAGNOSIS — Z794 Long term (current) use of insulin: Secondary | ICD-10-CM | POA: Diagnosis not present

## 2022-03-02 DIAGNOSIS — I10 Essential (primary) hypertension: Secondary | ICD-10-CM | POA: Diagnosis not present

## 2022-03-02 DIAGNOSIS — R011 Cardiac murmur, unspecified: Secondary | ICD-10-CM

## 2022-03-02 DIAGNOSIS — K219 Gastro-esophageal reflux disease without esophagitis: Secondary | ICD-10-CM | POA: Insufficient documentation

## 2022-03-02 DIAGNOSIS — K649 Unspecified hemorrhoids: Secondary | ICD-10-CM

## 2022-03-02 DIAGNOSIS — E162 Hypoglycemia, unspecified: Secondary | ICD-10-CM | POA: Diagnosis not present

## 2022-03-02 DIAGNOSIS — M7989 Other specified soft tissue disorders: Secondary | ICD-10-CM

## 2022-03-02 DIAGNOSIS — C61 Malignant neoplasm of prostate: Secondary | ICD-10-CM

## 2022-03-02 DIAGNOSIS — E669 Obesity, unspecified: Secondary | ICD-10-CM | POA: Insufficient documentation

## 2022-03-02 DIAGNOSIS — R600 Localized edema: Secondary | ICD-10-CM | POA: Insufficient documentation

## 2022-03-02 DIAGNOSIS — D696 Thrombocytopenia, unspecified: Secondary | ICD-10-CM

## 2022-03-02 DIAGNOSIS — Z862 Personal history of diseases of the blood and blood-forming organs and certain disorders involving the immune mechanism: Secondary | ICD-10-CM | POA: Insufficient documentation

## 2022-03-02 HISTORY — DX: Cardiac murmur, unspecified: R01.1

## 2022-03-02 LAB — POCT GLYCOSYLATED HEMOGLOBIN (HGB A1C)
HbA1c, POC (controlled diabetic range): 5.2 % (ref 0.0–7.0)
Hemoglobin A1C: 5.2 % (ref 4.0–5.6)

## 2022-03-02 MED ORDER — GVOKE HYPOPEN 2-PACK 1 MG/0.2ML ~~LOC~~ SOAJ: 1.0000 mg | SUBCUTANEOUS | 5 refills | Status: DC | PRN

## 2022-03-02 MED ORDER — FUROSEMIDE 20 MG PO TABS: 20.0000 mg | ORAL_TABLET | Freq: Every day | ORAL | 3 refills | Status: DC

## 2022-03-02 MED ORDER — HYDROCORTISONE ACETATE 25 MG RE SUPP: 25.0000 mg | Freq: Two times a day (BID) | RECTAL | 2 refills | Status: DC | PRN

## 2022-03-02 NOTE — Assessment & Plan Note (Addendum)
With HTN, liver cirrhosis and HLD ? ?Lab Results  ?Component Value Date  ? HGBA1C 5.2 03/02/2022  ? HGBA1C 5.2 03/02/2022  ? ?On Levemir 32 U qHS and Humalog ISS, followed by Endocrinology in [place], [place], advised to continue to follow up due to labile glycemic profile ?Prescribed Gvoke for hypoglycemia ?Advised to follow diabetic diet ?On statin and ACEi ?F/u CMP and lipid panel ?Diabetic eye exam: Advised to follow up with Ophthalmology for diabetic eye exam ?

## 2022-03-02 NOTE — Progress Notes (Signed)
Established Patient Office Visit  Subjective:  Patient ID: Michael Harmon, male    DOB: 12-24-1960  Age: 61 y.o. MRN: [id number]  CC:  Chief Complaint  Patient presents with   Follow-up    6 month follow up pt has right leg swelling for about 2 months would like to see if you would take over endocrinology medications    Michael Harmon is a 61 y.o. male with past medical history of HTN, hepatic cirrhosis, portal hypertension, esophageal varices, celiac disease, type II DM, HLD, thrombocytopenia and IDA who presents for f/u of his chronic medical conditions.  He has started radiotherapy for prostate cancer.  He is followed by radiation oncology, oncology and urology currently.  He denies any hematuria, dysuria or urinary hesitancy or resistance currently.  Type 2 DM: He takes Levemir 32 units nightly and Humalog ISS, and follows up with Endocrinology.  He has been trying to cut down Levemir dose to avoid hypoglycemia.  His HbA1c was 5.2 today. He did not tolerate metformin in the past, had diarrhea with that.  He attributes his celiac disease symptom onset to metformin.  Denies any polyuria or polydipsia currently.  He takes Lipitor for HLD.  He is also on ACEi for renal protection.  HTN: BP is well-controlled. Takes medications regularly. Patient denies headache, dizziness, chest pain, dyspnea or palpitations.   Celiac disease, hepatic cirrhosis: He has history of chronic diarrhea despite following gluten-free diet.  He is followed by [facility] and [facility].  He has history of esophageal varices as well.  He used to get surveillance EGD locally, but has to get it at [facility] now due to his thrombocytopenia.  Last EGD showed esophageal varices, for which he needs repeat surveillance EGD. He denies any melena or hematochezia currently. He has chronic leg swelling in the setting of hypoalbuminemia.  He complains of rectal pain when he has diarrhea.  He has history of hemorrhoids  and used to have suppository for as needed use for hemorrhoids.  He currently denies any rectal bleeding.   Past Medical History:  Diagnosis Date   Allergy    nose and sinus problems   Anemia    Asthma    Celiac disease    Cirrhosis, cryptogenic (HCC)    completed Hep A and B vaccines   DM (diabetes mellitus) (HCC)    GERD (gastroesophageal reflux disease)    Hyperlipidemia    diet controlled now with 100 pound weight loss   Hypertension    Iron deficiency anemia due to chronic blood loss 08/13/2021   Malignant neoplasm of prostate (HCC) 10/03/2021   Murmur, heart 03/02/2022   Narrow angle glaucoma suspect of both eyes    Renal cyst 09/03/2017   right   Situational depression 07/06/2017   Splenomegaly    Thrombocytopenia (HCC)    hematology, ?related to chol med (tricor) and glimeripide, just being monitored, above 100,000    Past Surgical History:  Procedure Laterality Date   BIOPSY  07/09/2016   Procedure: BIOPSY;  Surgeon: Corbin Ade, MD;  [facility];  Service: Endoscopy;;  duodenal, gastric, esophaageal   BIOPSY  07/31/2021   Procedure: BIOPSY;  Surgeon: Corbin Ade, MD;  [facility];  Service: Endoscopy;;   CLOSED MANIPULATION SHOULDER     COLONOSCOPY  11/2015   Dr. Allena Katz: cecal bx neg for microscopic colitis. ascending colon polyp (adenomatous)   COLONOSCOPY WITH PROPOFOL N/A 07/31/2021   Procedure:  COLONOSCOPY WITH PROPOFOL;  Surgeon: Corbin Ade, MD;  [facility];  Service: Endoscopy;  Laterality: N/A;  7:30am   ESOPHAGEAL BANDING N/A 08/04/2017   Procedure: ESOPHAGEAL BANDING;  Surgeon: Corbin Ade, MD;  [facility];  Service: Endoscopy;  Laterality: N/A;  esophageal varices banding   ESOPHAGEAL BANDING N/A 10/13/2017   Procedure: ESOPHAGEAL BANDING;  Surgeon: Corbin Ade, MD;  [facility];  Service: Endoscopy;  Laterality: N/A;   ESOPHAGEAL BANDING N/A 04/09/2020   Procedure: ESOPHAGEAL BANDING;   Surgeon: Corbin Ade, MD;  [facility];  Service: Endoscopy;  Laterality: N/A;   ESOPHAGOGASTRODUODENOSCOPY N/A 07/09/2016   Procedure: ESOPHAGOGASTRODUODENOSCOPY (EGD);  Surgeon: Corbin Ade, MD;  [facility];  Service: Endoscopy;  Laterality: N/A;  730   ESOPHAGOGASTRODUODENOSCOPY N/A 08/04/2017   Procedure: ESOPHAGOGASTRODUODENOSCOPY (EGD);  Surgeon: Corbin Ade, MD;  [facility];  Service: Endoscopy;  Laterality: N/A;  7:30am   ESOPHAGOGASTRODUODENOSCOPY N/A 10/13/2017   Dr. Jena Gauss: grade 2 esophageal varices status post banding, portal hypertensive gastropathy   ESOPHAGOGASTRODUODENOSCOPY  11/23/2019   [name]: Esophageal varices, 4 columns of grade 2-3 status post banding.  Varices more prominent than seen in December 2018.  Portal gastropathy.  Normal-appearing small bowel.   ESOPHAGOGASTRODUODENOSCOPY (EGD) WITH PROPOFOL N/A 04/09/2020   Dr. Jena Gauss: 4 columns of grade 2/grade 3 esophageal varices somewhat recalcitrant.  Status post esophageal band ligation today.  Portal gastropathy.  Normal-appearing small bowel.   ESOPHAGOGASTRODUODENOSCOPY (EGD) WITH PROPOFOL N/A 07/31/2021   Procedure: ESOPHAGOGASTRODUODENOSCOPY (EGD) WITH PROPOFOL;  Surgeon: Corbin Ade, MD;  [facility];  Service: Endoscopy;  Laterality: N/A;   GOLD SEED IMPLANT N/A 01/15/2022   Procedure: GOLD SEED IMPLANT;  Surgeon: Malen Gauze, MD;  Location: [facility];  Service: Urology;  Laterality: N/A;   SPACE OAR INSTILLATION N/A 01/15/2022   Procedure: SPACE OAR INSTILLATION;  Surgeon: Malen Gauze, MD;  Location: [facility];  Service: Urology;  Laterality: N/A;    Family History  Problem Relation Age of Onset   Other Mother        chronic diarrhea   COPD Mother    Heart disease Mother        died at 73   Arthritis Mother    Cancer Mother    Depression Mother    Diabetes Mother    Hyperlipidemia Mother    Hypertension Mother    Stroke Mother     Arthritis Father    Asthma Father    Birth defects Father    Heart disease Father        aortic valve replaced   Heart disease Maternal Grandmother    Stroke Maternal Grandmother    Heart disease Maternal Grandfather    Stroke Maternal Grandfather    Diabetes Paternal Grandmother    Stroke Paternal Grandfather    Heart disease Paternal Grandfather    Liver disease Neg Hx    Colon cancer Neg Hx     Social History   Socioeconomic History   Marital status: Married    Spouse name: Melissa   Number of children: 0   Years of education: 14   Highest education level: Not on file  Occupational History   Occupation: Lexicographer, 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   Has no children.   Eats all food groups.   Works for [employer]       Social Determinants of Health   Financial Resource Strain: Not on file  Food Insecurity: Not on file  Transportation Needs: Not on file  Physical Activity: Not on file  Stress: Not on file  Social Connections: Not on file  Intimate Partner Violence: Not on file    Outpatient Medications Prior to Visit  Medication Sig Dispense Refill   acetaminophen (TYLENOL) 325 MG tablet Take 650 mg by mouth every 6 (six) hours as needed for moderate pain.     atorvastatin (LIPITOR) 20 MG tablet Take 1 tablet (20 mg total) by mouth daily. (Patient taking differently: Take 20 mg by mouth in the morning.) 90 tablet 1   BD PEN NEEDLE NANO 2ND GEN 32G X 4 MM MISC USE TO INJECT INSULIN 4 TIMES DAILY E11.65     benazepril-hydrochlorthiazide (LOTENSIN HCT) 10-12.5  MG tablet Take 1 tablet by mouth in the morning.     Cholecalciferol (VITAMIN D3) 125 MCG (5000 UT) TABS Take 5,000 Units by mouth in the morning.     Continuous Blood Gluc Receiver (FREESTYLE LIBRE 2 READER) DEVI USE AS DIRECTED TO TEST GLUCOSE LEVELS AT LEAST 4 TIMES DAILY E11.9     Continuous Blood Gluc Sensor (FREESTYLE LIBRE 2 SENSOR) MISC USE AS DIRECTED FOR BLOOD SUGAR MONITORING. CHANGE EVERY 14 DAYS     HUMALOG KWIKPEN 100 UNIT/ML KwikPen Inject 10-16 Units into the skin with breakfast, with lunch, and with evening meal.     LEVEMIR FLEXTOUCH 100 UNIT/ML Pen Inject 32 Units into the skin at bedtime.     Multiple Vitamin (MULTIVITAMIN WITH MINERALS) TABS tablet Take 1 tablet by mouth in the morning.     Tamsulosin HCl (FLOMAX PO) Take by mouth.     traMADol (ULTRAM) 50 MG tablet Take 1 tablet (50 mg total) by mouth every 6 (six) hours as needed. 15 tablet 0   No facility-administered medications prior to visit.    Allergies  Allergen Reactions   Penicillins Other (See Comments)    As a baby, unknown reaction Did it involve swelling of the face/tongue/throat, SOB, or low BP? Unknown Did it involve sudden or severe rash/hives, skin peeling, or any reaction on the inside of your mouth or nose? Unknown Did you need to seek medical attention at a hospital or doctor's office? Unknown When did it last happen?      infant allergy If all above answers are "NO", may proceed with cephalosporin use. .    Gluten Meal Diarrhea   Biaxin [Clarithromycin] Tinitus    Ears ringing, loss sense of smell   Prednisone Hives    Made blood sugar high    ROS Review of Systems  Constitutional:  Positive for fatigue. Negative for chills and fever.  HENT:  Negative for congestion and sore throat.   Eyes:  Negative for pain and discharge.  Respiratory:  Negative for cough and shortness of breath.   Cardiovascular:  Positive for leg swelling. Negative for chest pain and palpitations.   Gastrointestinal:  Positive for diarrhea. Negative for nausea and vomiting.  Endocrine: Negative for polydipsia and polyuria.  Genitourinary:  Negative for dysuria and hematuria.  Musculoskeletal:  Negative for neck pain and neck stiffness.  Skin:  Negative for rash.  Neurological:  Negative for dizziness, weakness, numbness and headaches.  Psychiatric/Behavioral:  Positive for dysphoric mood and sleep disturbance. Negative for agitation and behavioral problems.      Objective:    Physical Exam Vitals reviewed.  Constitutional:      General: He is not in acute distress.    Appearance: He is not diaphoretic.  HENT:     Head: Normocephalic and atraumatic.     Nose: Nose normal.     Mouth/Throat:     Mouth: Mucous membranes are moist.  Eyes:     General: No scleral icterus.    Extraocular Movements: Extraocular movements intact.  Cardiovascular:     Rate and Rhythm: Normal rate and regular rhythm.     Pulses: Normal pulses.     Heart sounds: Normal heart sounds. No murmur heard. Pulmonary:     Breath sounds: Normal breath sounds. No wheezing or rales.  Abdominal:     Palpations: Abdomen is soft.     Tenderness: There is no abdominal tenderness.  Musculoskeletal:     Cervical back: Neck supple. No tenderness.     Right lower leg: Edema (2+) present.     Left lower leg: Edema (2+) present.  Skin:    General: Skin is warm.     Findings: No rash.  Neurological:     General: No focal deficit present.     Mental Status: He is alert and oriented to person, place, and time.     Sensory: No sensory deficit.     Motor: No weakness.  Psychiatric:        Mood and Affect: Mood normal.        Behavior: Behavior normal.    BP (!) 138/56 (BP Location: Right Arm, Patient Position: Sitting, Cuff Size: Normal)   Pulse 80   Resp 18   Ht 5\' 7"  (1.702 m)   Wt 211 lb 9.6 oz (96 kg)   SpO2 98%   BMI 33.14 kg/m  Wt Readings from Last 3 Encounters:  03/02/22 211 lb 9.6 oz (96 kg)   02/24/22 208 lb (94.3 kg)  02/10/22 201 lb 4.5 oz (91.3 kg)    No results found for: TSH Lab Results  Component Value Date   WBC 2.4 (L) 02/10/2022   HGB 9.5 (L) 02/10/2022   HCT 28.2 (L) 02/10/2022   MCV 100.0 02/10/2022   PLT 44 (L) 02/10/2022   Lab Results  Component Value Date   NA 136 08/13/2021   K 4.8 08/13/2021   CO2 21 (L) 08/13/2021   GLUCOSE 138 (H) 08/13/2021   BUN 15 08/13/2021   CREATININE 1.04 08/13/2021   BILITOT 1.4 (H) 08/13/2021   ALKPHOS 130 (H) 08/13/2021   AST 50 (H) 08/13/2021   ALT 40 08/13/2021   PROT 6.7 08/13/2021   ALBUMIN 3.4 (L) 08/13/2021   CALCIUM 8.8 (L) 08/13/2021   ANIONGAP 5 08/13/2021   Lab Results  Component Value Date   CHOL 108 09/02/2021   Lab Results  Component Value Date   HDL 53 09/02/2021   Lab Results  Component Value Date   LDLCALC 38 09/02/2021   Lab Results  Component Value Date   TRIG 88 09/02/2021   Lab Results  Component Value Date   CHOLHDL 2.0 09/02/2021   Lab Results  Component Value  Date   HGBA1C 5.2 03/02/2022   HGBA1C 5.2 03/02/2022      Assessment & Plan:   Problem List Items Addressed This Visit       Cardiovascular and Mediastinum   Essential hypertension    BP Readings from Last 1 Encounters:  03/02/22 (!) 138/56  Well-controlled with Benazepril-HCTZ 10-12.5 mg QD Counseled for compliance with the medications Advised DASH diet and moderate exercise/walking, at least 150 mins/week      Relevant Medications   furosemide (LASIX) 20 MG tablet   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)    Noted on last EGD at [facility] Followed by GI Gets surveillance EGD at [facility]       Relevant Medications   furosemide (LASIX) 20 MG tablet   Hemorrhoids    Has rectal pain when he has diarrhea Anusol suppository as needed       Relevant Medications   furosemide (LASIX) 20 MG tablet   hydrocortisone (ANUSOL-HC) 25 MG suppository     Endocrine   Type 2 diabetes mellitus  with other specified complication (HCC) - Primary    With HTN, liver cirrhosis and HLD  Lab Results  Component Value Date   HGBA1C 5.2 03/02/2022   HGBA1C 5.2 03/02/2022   On Levemir 32 U qHS and Humalog ISS, followed by Endocrinology in [place], [place] - DR Truitt, advised to continue to follow up due to labile glycemic profile Prescribed Gvoke for hypoglycemia Advised to follow diabetic diet On statin and ACEi F/u CMP and lipid panel Diabetic eye exam: Advised to follow up with Ophthalmology for diabetic eye exam      Relevant Medications   Glucagon (GVOKE HYPOPEN 2-PACK) 1 MG/0.2ML SOAJ   Other Relevant Orders   Microalbumin / creatinine urine ratio (Completed)   POCT glycosylated hemoglobin (Hb A1C) (Completed)     Genitourinary   Malignant neoplasm of prostate (HCC)    Started radiotherapy for prostate cancer.  He is followed by radiation oncology, oncology and urology currently.  He denies any hematuria, dysuria or urinary hesitancy or resistance currently.        Hematopoietic and Hemostatic   Thrombocytopenia (HCC)    Chronic, likely in the setting of liver cirrhosis Followed by Hematology Gets platelet transfusions before procedures       Other Visit Diagnoses     Hypoglycemia       Relevant Medications   Glucagon (GVOKE HYPOPEN 2-PACK) 1 MG/0.2ML SOAJ   Leg swelling       Relevant Medications   furosemide (LASIX) 20 MG tablet       Meds ordered this encounter  Medications   Glucagon (GVOKE HYPOPEN 2-PACK) 1 MG/0.2ML SOAJ    Sig: Inject 1 mg into the skin as needed (Blood glucose < 53).    Dispense:  0.4 mL    Refill:  5    Please dispense generic glucagon if Gvoke is not covered.   furosemide (LASIX) 20 MG tablet    Sig: Take 1 tablet (20 mg total) by mouth daily.    Dispense:  30 tablet    Refill:  3   hydrocortisone (ANUSOL-HC) 25 MG suppository    Sig: Place 1 suppository (25 mg total) rectally 2 (two) times daily as needed for hemorrhoids or  anal itching.    Dispense:  12 suppository    Refill:  2    Follow-up: Return in about 6 months (around 09/02/2022) for Annual physical.    [name], MD

## 2022-03-02 NOTE — Patient Instructions (Addendum)
Please take Lasix for leg swelling. ? ?Please continue taking other medications as prescribed. ? ?Please continue to follow low carb diet and ambulate as tolerated. ?

## 2022-03-04 ENCOUNTER — Encounter: Payer: Self-pay | Admitting: Internal Medicine

## 2022-03-04 LAB — MICROALBUMIN / CREATININE URINE RATIO
Creatinine, Urine: 145 mg/dL
Microalb/Creat Ratio: 12 mg/g creat (ref 0–29)
Microalbumin, Urine: 17 ug/mL

## 2022-03-06 DIAGNOSIS — K649 Unspecified hemorrhoids: Secondary | ICD-10-CM | POA: Insufficient documentation

## 2022-03-06 DIAGNOSIS — M7989 Other specified soft tissue disorders: Secondary | ICD-10-CM | POA: Insufficient documentation

## 2022-03-06 NOTE — Assessment & Plan Note (Signed)
BP Readings from Last 1 Encounters:  ?03/02/22 (!) 138/56  ? ?Well-controlled with Benazepril-HCTZ 10-12.5 mg QD ?Counseled for compliance with the medications ?Advised DASH diet and moderate exercise/walking, at least 150 mins/week ?

## 2022-03-06 NOTE — Assessment & Plan Note (Signed)
Chronic, likely in the setting of liver cirrhosis ?Followed by Hematology ?Gets platelet transfusions before procedures ?

## 2022-03-06 NOTE — Assessment & Plan Note (Signed)
Started radiotherapy for prostate cancer.  He is followed by radiation oncology, oncology and urology currently.  He denies any hematuria, dysuria or urinary hesitancy or resistance currently. ?

## 2022-03-06 NOTE — Assessment & Plan Note (Signed)
Noted on last EGD at [facility] ?Followed by GI ?Gets surveillance EGD at [place] ?

## 2022-03-06 NOTE — Assessment & Plan Note (Signed)
Likely due to hypoalbuminemia in the setting of hepatic cirrhosis ?Started Lasix 20 mg QD for now ?Advised to discuss with GI for Lasix and Spironolactone ?

## 2022-03-06 NOTE — Assessment & Plan Note (Signed)
Has rectal pain when he has diarrhea ?Anusol suppository as needed ?

## 2022-04-01 ENCOUNTER — Ambulatory Visit: Payer: BC Managed Care – PPO | Admitting: Urology

## 2022-04-01 ENCOUNTER — Encounter: Payer: Self-pay | Admitting: Urology

## 2022-04-01 VITALS — BP 107/55 | HR 80

## 2022-04-01 DIAGNOSIS — Z8546 Personal history of malignant neoplasm of prostate: Secondary | ICD-10-CM

## 2022-04-01 DIAGNOSIS — N5089 Other specified disorders of the male genital organs: Secondary | ICD-10-CM | POA: Diagnosis not present

## 2022-04-01 DIAGNOSIS — C61 Malignant neoplasm of prostate: Secondary | ICD-10-CM

## 2022-04-01 NOTE — Patient Instructions (Signed)
Peripheral Edema  Peripheral edema is swelling that is caused by a buildup of fluid. Peripheral edema most often affects the lower legs, ankles, and feet. It can also develop in the arms, hands, and face. The area of the body that has peripheral edema will look swollen. It may also feel heavy or warm. Your clothes may start to feel tight. Pressing on the area may make a temporary dent in your skin (pitting edema). You may not be able to move your swollen arm or leg as much as usual. There are many causes of peripheral edema. It can happen because of a complication of other conditions such as heart failure, kidney disease, or a problem with your circulation. It also can be a side effect of certain medicines or happen because of an infection. It often happens to women during pregnancy. Sometimes, the cause is not known. Follow these instructions at home: Managing pain, stiffness, and swelling  Raise (elevate) your legs while you are sitting or lying down. Move around often to prevent stiffness and to reduce swelling. Do not sit or stand for long periods of time. Do not wear tight clothing. Do not wear garters on your upper legs. Exercise your legs to get your circulation going. This helps to move the fluid back into your blood vessels, and it may help the swelling go down. Wear compression stockings as told by your health care provider. These stockings help to prevent blood clots and reduce swelling in your legs. It is important that these are the correct size. These stockings should be prescribed by your doctor to prevent possible injuries. If elastic bandages or wraps are recommended, use them as told by your health care provider. Medicines Take over-the-counter and prescription medicines only as told by your health care provider. Your health care provider may prescribe medicine to help your body get rid of excess water (diuretic). Take this medicine if you are told to take it. General  instructions Eat a low-salt (low-sodium) diet as told by your health care provider. Sometimes, eating less salt may reduce swelling. Pay attention to any changes in your symptoms. Moisturize your skin daily to help prevent skin from cracking and draining. Keep all follow-up visits. This is important. Contact a health care provider if: You have a fever. You have swelling in only one leg. You have increased swelling, redness, or pain in one or both of your legs. You have drainage or sores at the area where you have edema. Get help right away if: You have edema that starts suddenly or is getting worse, especially if you are pregnant or have a medical condition. You develop shortness of breath, especially when you are lying down. You have pain in your chest or abdomen. You feel weak. You feel like you will faint. These symptoms may be an emergency. Get help right away. Call 911. Do not wait to see if the symptoms will go away. Do not drive yourself to the hospital. Summary Peripheral edema is swelling that is caused by a buildup of fluid. Peripheral edema most often affects the lower legs, ankles, and feet. Move around often to prevent stiffness and to reduce swelling. Do not sit or stand for long periods of time. Pay attention to any changes in your symptoms. Contact a health care provider if you have edema that starts suddenly or is getting worse, especially if you are pregnant or have a medical condition. Get help right away if you develop shortness of breath, especially when lying down.  This information is not intended to replace advice given to you by your health care provider. Make sure you discuss any questions you have with your health care provider. Document Revised: 06/23/2021 Document Reviewed: 06/23/2021 Elsevier Patient Education  [place].

## 2022-04-01 NOTE — Progress Notes (Signed)
04/01/2022 4:37 PM   Michael Harmon 07-28-1961 [id number]  Referring provider: Lindell Spar, MD [address] [place],  [place] [address]  Scrotal swelling   HPI: Michael Harmon is a 61yo here for evaluation of testicular swelling. He has noted increased fluid retention and was started on lasix. His creatinine increased and the lasix was stopped after being on it for 10 days. Last night he noted increased testicular swelling. No recent scrotal trauma. He denies testicular pain. No issues urinating. He notes increased abdominal distention.    PMH: Past Medical History:  Diagnosis Date   Allergy    nose and sinus problems   Anemia    Asthma    Celiac disease    Cirrhosis, cryptogenic (HCC)    completed Hep A and B vaccines   DM (diabetes mellitus) (HCC)    GERD (gastroesophageal reflux disease)    Hyperlipidemia    diet controlled now with 100 pound weight loss   Hypertension    Iron deficiency anemia due to chronic blood loss 08/13/2021   Malignant neoplasm of prostate ([place]) 10/03/2021   Murmur, heart 03/02/2022   Narrow angle glaucoma suspect of both eyes    Renal cyst 09/03/2017   right   Situational depression 07/06/2017   Splenomegaly    Thrombocytopenia ([place])    hematology, ?related to chol med (tricor) and glimeripide, just being monitored, above 100,000    Surgical History: Past Surgical History:  Procedure Laterality Date   BIOPSY  07/09/2016   Procedure: BIOPSY;  Surgeon: [name], MD;  [facility];  Service: Endoscopy;;  duodenal, gastric, esophaageal   BIOPSY  07/31/2021   Procedure: BIOPSY;  Surgeon: [name], MD;  [facility];  Service: Endoscopy;;   CLOSED MANIPULATION SHOULDER     COLONOSCOPY  11/2015   Dr. Posey Pronto: cecal bx neg for microscopic colitis. ascending colon polyp (adenomatous)   COLONOSCOPY WITH PROPOFOL N/A 07/31/2021   Procedure: COLONOSCOPY WITH PROPOFOL;  Surgeon: [name], MD;  [facility];   Service: Endoscopy;  Laterality: N/A;  7:30am   ESOPHAGEAL BANDING N/A 08/04/2017   Procedure: ESOPHAGEAL BANDING;  Surgeon: [name], MD;  [facility];  Service: Endoscopy;  Laterality: N/A;  esophageal varices banding   ESOPHAGEAL BANDING N/A 10/13/2017   Procedure: ESOPHAGEAL BANDING;  Surgeon: [name], MD;  [facility];  Service: Endoscopy;  Laterality: N/A;   ESOPHAGEAL BANDING N/A 04/09/2020   Procedure: ESOPHAGEAL BANDING;  Surgeon: [name], MD;  [facility];  Service: Endoscopy;  Laterality: N/A;   ESOPHAGOGASTRODUODENOSCOPY N/A 07/09/2016   Procedure: ESOPHAGOGASTRODUODENOSCOPY (EGD);  Surgeon: [name], MD;  [facility];  Service: Endoscopy;  Laterality: N/A;  730   ESOPHAGOGASTRODUODENOSCOPY N/A 08/04/2017   Procedure: ESOPHAGOGASTRODUODENOSCOPY (EGD);  Surgeon: [name], MD;  [facility];  Service: Endoscopy;  Laterality: N/A;  7:30am   ESOPHAGOGASTRODUODENOSCOPY N/A 10/13/2017   Dr. [name]: grade 2 esophageal varices status post banding, portal hypertensive gastropathy   ESOPHAGOGASTRODUODENOSCOPY  11/23/2019   [name]: Esophageal varices, 4 columns of grade 2-3 status post banding.  Varices more prominent than seen in December 2018.  Portal gastropathy.  Normal-appearing small bowel.   ESOPHAGOGASTRODUODENOSCOPY (EGD) WITH PROPOFOL N/A 04/09/2020   Dr. [name]: 4 columns of grade 2/grade 3 esophageal varices somewhat recalcitrant.  Status post esophageal band ligation today.  Portal gastropathy.  Normal-appearing small bowel.  ESOPHAGOGASTRODUODENOSCOPY (EGD) WITH PROPOFOL N/A 07/31/2021   Procedure: ESOPHAGOGASTRODUODENOSCOPY (EGD) WITH PROPOFOL;  Surgeon: [name], MD;  [facility];  Service: Endoscopy;  Laterality: N/A;   GOLD SEED IMPLANT N/A 01/15/2022   Procedure: GOLD SEED IMPLANT;  Surgeon: Cleon Gustin, MD;  Location: [facility];  Service: Urology;  Laterality: N/A;    SPACE OAR INSTILLATION N/A 01/15/2022   Procedure: SPACE OAR INSTILLATION;  Surgeon: Cleon Gustin, MD;  Location: [facility];  Service: Urology;  Laterality: N/A;    Home Medications:  Allergies as of 04/01/2022       Reactions   Penicillins Other (See Comments)   As a baby, unknown reaction Did it involve swelling of the face/tongue/throat, SOB, or low BP? Unknown Did it involve sudden or severe rash/hives, skin peeling, or any reaction on the inside of your mouth or nose? Unknown Did you need to seek medical attention at a hospital or doctor's office? Unknown When did it last happen?      infant allergy If all above answers are "NO", may proceed with cephalosporin use. .   Gluten Meal Diarrhea   Biaxin [clarithromycin] Tinitus   Ears ringing, loss sense of smell   Prednisone Hives   Made blood sugar high        Medication List        Accurate as of Apr 01, 2022  4:37 PM. If you have any questions, ask your nurse or doctor.          acetaminophen 325 MG tablet Commonly known as: TYLENOL Take 650 mg by mouth every 6 (six) hours as needed for moderate pain.   atorvastatin 20 MG tablet Commonly known as: LIPITOR Take 1 tablet (20 mg total) by mouth daily. What changed: when to take this   BD Pen Needle Nano 2nd Gen 32G X 4 MM Misc Generic drug: Insulin Pen Needle USE TO INJECT INSULIN 4 TIMES DAILY E11.65   benazepril-hydrochlorthiazide 10-12.5 MG tablet Commonly known as: LOTENSIN HCT Take 1 tablet by mouth in the morning.   FLOMAX PO Take by mouth.   FreeStyle Libre 2 Reader Kerrin Mo USE AS DIRECTED TO TEST GLUCOSE LEVELS AT LEAST 4 TIMES DAILY E11.9   FreeStyle Libre 2 Sensor Misc USE AS DIRECTED FOR BLOOD SUGAR MONITORING. CHANGE EVERY 14 DAYS   furosemide 20 MG tablet Commonly known as: LASIX Take 1 tablet (20 mg total) by mouth daily.   Gvoke HypoPen 2-Pack 1 MG/0.2ML Soaj Generic drug: Glucagon Inject 1 mg into the skin as needed (Blood glucose <  53).   HumaLOG KwikPen 100 UNIT/ML KwikPen Generic drug: insulin lispro Inject 10-16 Units into the skin with breakfast, with lunch, and with evening meal.   hydrocortisone 25 MG suppository Commonly known as: ANUSOL-HC Place 1 suppository (25 mg total) rectally 2 (two) times daily as needed for hemorrhoids or anal itching.   Levemir FlexTouch 100 UNIT/ML FlexPen Generic drug: insulin detemir Inject 32 Units into the skin at bedtime.   multivitamin with minerals Tabs tablet Take 1 tablet by mouth in the morning.   traMADol 50 MG tablet Commonly known as: Ultram Take 1 tablet (50 mg total) by mouth every 6 (six) hours as needed.   Vitamin D3 125 MCG (5000 UT) Tabs Take 5,000 Units by mouth in the morning.        Allergies:  Allergies  Allergen Reactions   Penicillins Other (See Comments)    As a baby, unknown reaction Did it involve  swelling of the face/tongue/throat, SOB, or low BP? Unknown Did it involve sudden or severe rash/hives, skin peeling, or any reaction on the inside of your mouth or nose? Unknown Did you need to seek medical attention at a hospital or doctor's office? Unknown When did it last happen?      infant allergy If all above answers are "NO", may proceed with cephalosporin use. .    Gluten Meal Diarrhea   Biaxin [Clarithromycin] Tinitus    Ears ringing, loss sense of smell   Prednisone Hives    Made blood sugar high    Family History: Family History  Problem Relation Age of Onset   Other Mother        chronic diarrhea   COPD Mother    Heart disease Mother        died at 67   Arthritis Mother    Cancer Mother    Depression Mother    Diabetes Mother    Hyperlipidemia Mother    Hypertension Mother    Stroke Mother    Arthritis Father    Asthma Father    Birth defects Father    Heart disease Father        aortic valve replaced   Heart disease Maternal Grandmother    Stroke Maternal Grandmother    Heart disease Maternal Grandfather     Stroke Maternal Grandfather    Diabetes Paternal Grandmother    Stroke Paternal Grandfather    Heart disease Paternal Grandfather    Liver disease Neg Hx    Colon cancer Neg Hx     Social History:  reports that he has never smoked. He has never used smokeless tobacco. He reports that he does not currently use alcohol. He reports that he does not use drugs.  ROS: All other review of systems were reviewed and are negative except what is noted above in HPI  Physical Exam: BP (!) 107/55   Pulse 80   Constitutional:  Alert and oriented, No acute distress. HEENT: [place] AT, moist mucus membranes.  Trachea midline, no masses. Cardiovascular: No clubbing, cyanosis, or edema. Respiratory: Normal respiratory effort, no increased work of breathing. GI: Abdomen is soft, nontender, nondistended, no abdominal masses GU: No CVA tenderness. Circumcised phallus. No masses/lesions on penis, testis, scrotum. Testicles nontender.  Moderate testicular scrotal wall edema.  Lymph: No cervical or inguinal lymphadenopathy. Skin: No rashes, bruises or suspicious lesions. Neurologic: Grossly intact, no focal deficits, moving all 4 extremities. Psychiatric: Normal mood and affect.  Laboratory Data: Lab Results  Component Value Date   WBC 2.4 (L) 02/10/2022   HGB 9.5 (L) 02/10/2022   HCT 28.2 (L) 02/10/2022   MCV 100.0 02/10/2022   PLT 44 (L) 02/10/2022    Lab Results  Component Value Date   CREATININE 1.04 08/13/2021    No results found for: PSA  No results found for: TESTOSTERONE  Lab Results  Component Value Date   HGBA1C 5.2 03/02/2022   HGBA1C 5.2 03/02/2022    Urinalysis    Component Value Date/Time   COLORURINE YELLOW 06/07/2020 2150   APPEARANCEUR Clear 01/26/2022 1324   LABSPEC 1.012 06/07/2020 2150   PHURINE 5.0 06/07/2020 2150   GLUCOSEU Trace (A) 01/26/2022 1324   HGBUR NEGATIVE 06/07/2020 2150   BILIRUBINUR Negative 01/26/2022 1324   KETONESUR NEGATIVE 06/07/2020 2150    PROTEINUR 3+ (A) 01/26/2022 1324   PROTEINUR NEGATIVE 06/07/2020 2150   NITRITE Negative 01/26/2022 1324   NITRITE NEGATIVE 06/07/2020 2150   LEUKOCYTESUR  Negative 01/26/2022 1324   LEUKOCYTESUR NEGATIVE 06/07/2020 2150    Lab Results  Component Value Date   LABMICR 17.0 03/02/2022   WBCUA 0-5 01/26/2022   LABEPIT >10 (A) 01/26/2022   MUCUS Present 01/26/2022   BACTERIA Few 01/26/2022    Pertinent Imaging:  No results found for this or any previous visit.  No results found for this or any previous visit.  No results found for this or any previous visit.  No results found for this or any previous visit.  No results found for this or any previous visit.  No results found for this or any previous visit.  No results found for this or any previous visit.  No results found for this or any previous visit.   Assessment & Plan:    1. Testicular swelling -The testicular swelling is related to fluid retention. Patient was instructed on scrotal support. He was instructed to call his PCP and GI physicians due to increased anasarca.    No follow-ups on file.  Nicolette Bang, MD  [facility] [place]

## 2022-04-02 ENCOUNTER — Telehealth: Payer: Self-pay

## 2022-04-02 LAB — PSA: Prostate Specific Ag, Serum: 0.1 ng/mL (ref 0.0–4.0)

## 2022-04-02 NOTE — Telephone Encounter (Signed)
Pt came in to the office today to try to be seen. Pt stated that he has gained 12 lbs in a week and that his scrotum is the size of a softball. Pt is a pt with [facility]. I advised pt to either go to [facility] ER or to [facility] ER to be evaluated due to his symptoms. Pt verbalized understanding.

## 2022-04-06 ENCOUNTER — Ambulatory Visit: Payer: BC Managed Care – PPO | Admitting: Urology

## 2022-04-06 ENCOUNTER — Telehealth: Payer: Self-pay

## 2022-04-06 NOTE — Telephone Encounter (Signed)
Pt came in again today stating that he never went to be checked out at the ER for his weight gain or for his scrotum being swollen. Pt is wanting you to look at his notes from [place]. Pt states that the nurse at the liver center told him that he doesn't need to go to the ER for the weight gain/swelling unless he has a fever or trouble breathing so the patient did not go. Pt is still swollen. He states that his scrotum has come down some after recommendations from Dr. Doroteo Bradford the urologist. Pt is wanting to know what he needs to do. Pt is not wanting to go to the ER. Please advise.

## 2022-04-06 NOTE — Telephone Encounter (Signed)
Routing to [employer] PA-C in Dr. Roseanne Kaufman absence.

## 2022-04-07 NOTE — Telephone Encounter (Signed)
Pt was made aware. Pt has an appt next week.

## 2022-04-07 NOTE — Telephone Encounter (Signed)
Lmom for pt to return my call.  

## 2022-04-07 NOTE — Telephone Encounter (Signed)
He needs an in person ov. We have not seen him since 11/2021.

## 2022-04-10 ENCOUNTER — Other Ambulatory Visit: Payer: Self-pay

## 2022-04-10 ENCOUNTER — Encounter (HOSPITAL_COMMUNITY): Payer: Self-pay | Admitting: Emergency Medicine

## 2022-04-10 ENCOUNTER — Emergency Department (HOSPITAL_COMMUNITY)
Admission: EM | Admit: 2022-04-10 | Discharge: 2022-04-11 | Disposition: A | Discharge status: Home / Self Care | Payer: BC Managed Care – PPO | Attending: Emergency Medicine | Admitting: Emergency Medicine

## 2022-04-10 ENCOUNTER — Emergency Department (HOSPITAL_COMMUNITY): Payer: BC Managed Care – PPO

## 2022-04-10 DIAGNOSIS — M7989 Other specified soft tissue disorders: Secondary | ICD-10-CM | POA: Diagnosis not present

## 2022-04-10 DIAGNOSIS — D72829 Elevated white blood cell count, unspecified: Secondary | ICD-10-CM | POA: Diagnosis not present

## 2022-04-10 DIAGNOSIS — R748 Abnormal levels of other serum enzymes: Secondary | ICD-10-CM | POA: Diagnosis not present

## 2022-04-10 DIAGNOSIS — R188 Other ascites: Secondary | ICD-10-CM | POA: Diagnosis not present

## 2022-04-10 DIAGNOSIS — R140 Abdominal distension (gaseous): Secondary | ICD-10-CM | POA: Diagnosis present

## 2022-04-10 LAB — URINALYSIS, ROUTINE W REFLEX MICROSCOPIC
Bilirubin Urine: NEGATIVE
Glucose, UA: NEGATIVE mg/dL
Hgb urine dipstick: NEGATIVE
Ketones, ur: NEGATIVE mg/dL
Leukocytes,Ua: NEGATIVE
Nitrite: NEGATIVE
Protein, ur: NEGATIVE mg/dL
Specific Gravity, Urine: 1.006 (ref 1.005–1.030)
pH: 5 (ref 5.0–8.0)

## 2022-04-10 LAB — COMPREHENSIVE METABOLIC PANEL
ALT: 38 U/L (ref 0–44)
AST: 57 U/L — ABNORMAL HIGH (ref 15–41)
Albumin: 3 g/dL — ABNORMAL LOW (ref 3.5–5.0)
Alkaline Phosphatase: 161 U/L — ABNORMAL HIGH (ref 38–126)
Anion gap: 6 (ref 5–15)
BUN: 32 mg/dL — ABNORMAL HIGH (ref 8–23)
CO2: 19 mmol/L — ABNORMAL LOW (ref 22–32)
Calcium: 8.4 mg/dL — ABNORMAL LOW (ref 8.9–10.3)
Chloride: 110 mmol/L (ref 98–111)
Creatinine, Ser: 1.65 mg/dL — ABNORMAL HIGH (ref 0.61–1.24)
GFR, Estimated: 47 mL/min — ABNORMAL LOW (ref >60)
Glucose, Bld: 131 mg/dL — ABNORMAL HIGH (ref 70–99)
Potassium: 4.1 mmol/L (ref 3.5–5.1)
Sodium: 135 mmol/L (ref 135–145)
Total Bilirubin: 1.5 mg/dL — ABNORMAL HIGH (ref 0.3–1.2)
Total Protein: 6.2 g/dL — ABNORMAL LOW (ref 6.5–8.1)

## 2022-04-10 LAB — PROTIME-INR
INR: 1.2 (ref 0.8–1.2)
Prothrombin Time: 14.7 seconds (ref 11.4–15.2)

## 2022-04-10 LAB — CBC
HCT: 26.5 % — ABNORMAL LOW (ref 39.0–52.0)
Hemoglobin: 9 g/dL — ABNORMAL LOW (ref 13.0–17.0)
MCH: 34.1 pg — ABNORMAL HIGH (ref 26.0–34.0)
MCHC: 34 g/dL (ref 30.0–36.0)
MCV: 100.4 fL — ABNORMAL HIGH (ref 80.0–100.0)
Platelets: 65 10*3/uL — ABNORMAL LOW (ref 150–400)
RBC: 2.64 MIL/uL — ABNORMAL LOW (ref 4.22–5.81)
RDW: 14 % (ref 11.5–15.5)
WBC: 3.4 10*3/uL — ABNORMAL LOW (ref 4.0–10.5)
nRBC: 0 % (ref 0.0–0.2)

## 2022-04-10 LAB — AMMONIA: Ammonia: 82 umol/L — ABNORMAL HIGH (ref 9–35)

## 2022-04-10 LAB — LIPASE, BLOOD: Lipase: 62 U/L — ABNORMAL HIGH (ref 11–51)

## 2022-04-10 MED ORDER — IOHEXOL 300 MG/ML  SOLN
100.0000 mL | Freq: Once | INTRAMUSCULAR | Status: AC | PRN
  Administered 2022-04-11: 100 mL via INTRAVENOUS

## 2022-04-10 NOTE — ED Triage Notes (Signed)
Pt c/o abd swelling and pain x 10 days. Pt states he is not urinating well x 4 days. Pt states he has fatty liver.

## 2022-04-10 NOTE — ED Provider Notes (Signed)
[facility] EMERGENCY DEPARTMENT Provider Note   CSN: [id number] Arrival date & time: 04/10/22  1939     History  Chief Complaint  Patient presents with   Abdominal Pain    Michael Harmon is a 61 y.o. male.  Patient presents ER chief complaint of abdominal distention and intermittent abdominal pain.  Describes sharp aching pain that comes and goes for the past 10 days.  Has a history of liver cirrhosis not a transplant list at [facility], otherwise denies any fevers no cough no vomiting or diarrhea.  Complaining of diffuse swelling of the legs and testicles as well.  He was on Lasix for some time, was taken off by his liver team this past week but now has noticed increased swelling since he has been off of Lasix.       Home Medications Prior to Admission medications   Medication Sig Start Date End Date Taking? Authorizing Provider  acetaminophen (TYLENOL) 325 MG tablet Take 650 mg by mouth every 6 (six) hours as needed for moderate pain.    [provider]  atorvastatin (LIPITOR) 20 MG tablet Take 1 tablet (20 mg total) by mouth daily. Patient taking differently: Take 20 mg by mouth in the morning. 07/21/18   Caren Macadam, MD  BD PEN NEEDLE NANO 2ND GEN 32G X 4 MM MISC USE TO INJECT INSULIN 4 TIMES DAILY E11.65 08/06/21   [provider]  benazepril-hydrochlorthiazide (LOTENSIN HCT) 10-12.5 MG tablet Take 1 tablet by mouth in the morning.    [provider]  Cholecalciferol (VITAMIN D3) 125 MCG (5000 UT) TABS Take 5,000 Units by mouth in the morning.    [provider]  Continuous Blood Gluc Receiver (FREESTYLE LIBRE 2 READER) DEVI USE AS DIRECTED TO TEST GLUCOSE LEVELS AT LEAST 4 TIMES DAILY E11.9 09/29/21   [provider]  Continuous Blood Gluc Sensor (FREESTYLE LIBRE 2 SENSOR) MISC USE AS DIRECTED FOR BLOOD SUGAR MONITORING. CHANGE EVERY 14 DAYS 08/10/21   [provider]  furosemide (LASIX) 20 MG tablet Take 1 tablet (20 mg total)  by mouth daily. 03/02/22   Lindell Spar, MD  Glucagon (GVOKE HYPOPEN 2-PACK) 1 MG/0.2ML SOAJ Inject 1 mg into the skin as needed (Blood glucose < 53). 03/02/22   Lindell Spar, MD  HUMALOG KWIKPEN 100 UNIT/ML KwikPen Inject 10-16 Units into the skin with breakfast, with lunch, and with evening meal. 06/10/21   [provider]  hydrocortisone (ANUSOL-HC) 25 MG suppository Place 1 suppository (25 mg total) rectally 2 (two) times daily as needed for hemorrhoids or anal itching. 03/02/22   Patel, Colin Broach, MD  LEVEMIR FLEXTOUCH 100 UNIT/ML Pen Inject 32 Units into the skin at bedtime. 12/25/18   [provider]  Multiple Vitamin (MULTIVITAMIN WITH MINERALS) TABS tablet Take 1 tablet by mouth in the morning.    [provider]  Tamsulosin HCl (FLOMAX PO) Take by mouth.    [provider]  traMADol (ULTRAM) 50 MG tablet Take 1 tablet (50 mg total) by mouth every 6 (six) hours as needed. 01/15/22 01/15/23  Cleon Gustin, MD      Allergies    Penicillins, Gluten meal, Biaxin [clarithromycin], and Prednisone    Review of Systems   Review of Systems  Constitutional:  Negative for fever.  HENT:  Negative for ear pain and sore throat.   Eyes:  Negative for pain.  Respiratory:  Negative for cough.   Cardiovascular:  Negative for chest pain.  Gastrointestinal:  Positive for abdominal pain.  Genitourinary:  Negative for flank pain.  Musculoskeletal:  Negative for back pain.  Skin:  Negative for color change and rash.  Neurological:  Negative for syncope.  All other systems reviewed and are negative.   Physical Exam Updated Vital Signs BP (!) 121/58   Pulse 88   Temp 97.9 F (36.6 C) (Oral)   Resp 18   Ht 5' 7"  (1.702 m)   Wt 98.4 kg   SpO2 100%   BMI 33.99 kg/m  Physical Exam Constitutional:      Appearance: He is well-developed.  HENT:     Head: Normocephalic.     Nose: Nose normal.  Eyes:     Extraocular Movements: Extraocular movements intact.   Cardiovascular:     Rate and Rhythm: Normal rate.  Pulmonary:     Effort: Pulmonary effort is normal.  Abdominal:     Comments: Diffuse distended abdomen.  No tenderness.  No guarding or rebound noted.  Positive fluid wave.  Musculoskeletal:     Right lower leg: Edema present.     Left lower leg: Edema present.  Skin:    Coloration: Skin is not jaundiced.  Neurological:     Mental Status: He is alert. Mental status is at baseline.     ED Results / Procedures / Treatments   Labs (all labs ordered are listed, but only abnormal results are displayed) Labs Reviewed  LIPASE, BLOOD - Abnormal; Notable for the following components:      Result Value   Lipase 62 (*)    All other components within normal limits  COMPREHENSIVE METABOLIC PANEL - Abnormal; Notable for the following components:   CO2 19 (*)    Glucose, Bld 131 (*)    BUN 32 (*)    Creatinine, Ser 1.65 (*)    Calcium 8.4 (*)    Total Protein 6.2 (*)    Albumin 3.0 (*)    AST 57 (*)    Alkaline Phosphatase 161 (*)    Total Bilirubin 1.5 (*)    GFR, Estimated 47 (*)    All other components within normal limits  CBC - Abnormal; Notable for the following components:   WBC 3.4 (*)    RBC 2.64 (*)    Hemoglobin 9.0 (*)    HCT 26.5 (*)    MCV 100.4 (*)    MCH 34.1 (*)    Platelets 65 (*)    All other components within normal limits  AMMONIA - Abnormal; Notable for the following components:   Ammonia 82 (*)    All other components within normal limits  URINALYSIS, ROUTINE W REFLEX MICROSCOPIC  PROTIME-INR    EKG None  Radiology No results found.  Procedures Procedures    Medications Ordered in ED Medications  iohexol (OMNIPAQUE) 300 MG/ML solution 100 mL (has no administration in time range)    ED Course/ Medical Decision Making/ A&P                           Medical Decision Making Amount and/or Complexity of Data Reviewed Labs: ordered.   Chart review shows outpatient visit Mar 25, 2022 for  testicular swelling.  Data studies were sent white count 3.4 hemoglobin of 9 chemistry creatinine of 1.6 liver enzymes mildly elevated T. bili 1.5.  SBP considered but I doubt it given nontender abdomen and no fevers.  However he does appear to have symptomatic ascites.  Patient has never  had a paracentesis before.  CT abdomen pelvis pending.        Final Clinical Impression(s) / ED Diagnoses Final diagnoses:  Other ascites    Rx / DC Orders ED Discharge Orders     None         Luna Fuse, MD 04/10/22 2359

## 2022-04-10 NOTE — ED Provider Triage Note (Signed)
Emergency Medicine Provider Triage Evaluation Note  Michael Harmon , a 61 y.o. male  was evaluated in triage.  Pt complains of 10 days of abdominal distention.  Is been progressive, denies any confusion or altered mental status.  Patient follows with [facility] hepatology, liver transplant candidate.  He states he started with scrotal swelling, followed by Dr. Alyson Harmon with urology.  Also having lower extremity pitting edema.  Diffuse abdominal pain, denies any nausea or vomiting.  No hematemesis, no dark or tarry stools..  Review of Systems  Per HPI  Physical Exam  BP (!) 148/56 (BP Location: Right Arm)   Pulse 98   Temp 97.9 F (36.6 C) (Oral)   Resp 17   Ht 5' 7"  (1.702 m)   Wt 98.4 kg   SpO2 100%   BMI 33.99 kg/m  Gen:   Awake, no distress   Resp:  Normal effort  MSK:   Moves extremities without difficulty  Other:  Bilateral pitting edema, abdominal distention but abdomen is soft without focal tenderness.  Medical Decision Making  Medically screening exam initiated at 9:59 PM.  Appropriate orders placed.  Michael Harmon was informed that the remainder of the evaluation will be completed by another provider, this initial triage assessment does not replace that evaluation, and the importance of remaining in the ED until their evaluation is complete.     Sherrill Raring, PA-C 04/10/22 2200

## 2022-04-11 MED ORDER — OXYCODONE-ACETAMINOPHEN 5-325 MG PO TABS: 1.0000 | ORAL_TABLET | Freq: Three times a day (TID) | ORAL | 0 refills | Status: DC | PRN

## 2022-04-11 NOTE — ED Provider Notes (Signed)
Patient signed out to me by Dr. Almyra Free.  Patient with known alcoholic cirrhosis, followed at [facility].  He has had ongoing edema secondary to his cirrhosis.  His doctor just stopped his Lasix, however, because his creatinine was elevated.  Since then he has developed a lot more swelling.  He has been experiencing intermittent abdominal pain and discomfort secondary to the distention.  No fevers.  Work-up shows moderate ascites.  Examination does not support diagnosis of SBP.  Discussed with patient possible admission.  I offered either transfer to [facility] or [facility] where he could have empiric antibiotics and receive paracentesis to determine if there is infection (felt to be unlikely) as well as relief of his discomfort secondary to the distention.  He does not think he needs to be admitted at this time.  He has a very good support system and can return if he has any worsening symptoms.  Discussed with him need for return, or preferably, going to [facility] ER if he develops a fever, increasing distention, worsening pain or abdominal tenderness.  We will provide him with analgesia for the weekend.  He tells me that he is allowed to take 3 Tylenol per day, per his liver team.  He was therefore given a dispense pack of Percocet, take up to 3/day over the weekend.  He will contact his liver team Monday morning.   Orpah Greek, MD 04/11/22 [phone]

## 2022-04-11 NOTE — Discharge Instructions (Signed)
Contact your liver specialist at [facility] Monday morning for further instructions.  If you develop fever, worsening pain then you need to return to the ER.  As discussed, it might be better, if possible, if you go to the ER at [facility] if you need further care over the weekend.  Of course you are welcome to come back here if you need to.

## 2022-04-13 ENCOUNTER — Inpatient Hospital Stay (HOSPITAL_COMMUNITY): Payer: BC Managed Care – PPO | Attending: Hematology

## 2022-04-13 DIAGNOSIS — K766 Portal hypertension: Secondary | ICD-10-CM | POA: Insufficient documentation

## 2022-04-13 DIAGNOSIS — R188 Other ascites: Secondary | ICD-10-CM | POA: Diagnosis not present

## 2022-04-13 DIAGNOSIS — R42 Dizziness and giddiness: Secondary | ICD-10-CM | POA: Diagnosis not present

## 2022-04-13 DIAGNOSIS — D72819 Decreased white blood cell count, unspecified: Secondary | ICD-10-CM | POA: Insufficient documentation

## 2022-04-13 DIAGNOSIS — I10 Essential (primary) hypertension: Secondary | ICD-10-CM | POA: Diagnosis not present

## 2022-04-13 DIAGNOSIS — E119 Type 2 diabetes mellitus without complications: Secondary | ICD-10-CM | POA: Diagnosis not present

## 2022-04-13 DIAGNOSIS — D509 Iron deficiency anemia, unspecified: Secondary | ICD-10-CM | POA: Insufficient documentation

## 2022-04-13 DIAGNOSIS — Z809 Family history of malignant neoplasm, unspecified: Secondary | ICD-10-CM | POA: Diagnosis not present

## 2022-04-13 DIAGNOSIS — D61818 Other pancytopenia: Secondary | ICD-10-CM | POA: Insufficient documentation

## 2022-04-13 DIAGNOSIS — C61 Malignant neoplasm of prostate: Secondary | ICD-10-CM | POA: Diagnosis not present

## 2022-04-13 DIAGNOSIS — M7989 Other specified soft tissue disorders: Secondary | ICD-10-CM | POA: Insufficient documentation

## 2022-04-13 DIAGNOSIS — R634 Abnormal weight loss: Secondary | ICD-10-CM | POA: Diagnosis not present

## 2022-04-13 DIAGNOSIS — K7460 Unspecified cirrhosis of liver: Secondary | ICD-10-CM | POA: Diagnosis not present

## 2022-04-13 DIAGNOSIS — D649 Anemia, unspecified: Secondary | ICD-10-CM

## 2022-04-13 DIAGNOSIS — D696 Thrombocytopenia, unspecified: Secondary | ICD-10-CM

## 2022-04-13 DIAGNOSIS — E7211 Homocystinuria: Secondary | ICD-10-CM

## 2022-04-13 DIAGNOSIS — R5383 Other fatigue: Secondary | ICD-10-CM | POA: Diagnosis not present

## 2022-04-13 DIAGNOSIS — R161 Splenomegaly, not elsewhere classified: Secondary | ICD-10-CM | POA: Insufficient documentation

## 2022-04-13 LAB — COMPREHENSIVE METABOLIC PANEL
ALT: 34 U/L (ref 0–44)
AST: 49 U/L — ABNORMAL HIGH (ref 15–41)
Albumin: 2.7 g/dL — ABNORMAL LOW (ref 3.5–5.0)
Alkaline Phosphatase: 159 U/L — ABNORMAL HIGH (ref 38–126)
Anion gap: 4 — ABNORMAL LOW (ref 5–15)
BUN: 39 mg/dL — ABNORMAL HIGH (ref 8–23)
CO2: 19 mmol/L — ABNORMAL LOW (ref 22–32)
Calcium: 7.9 mg/dL — ABNORMAL LOW (ref 8.9–10.3)
Chloride: 112 mmol/L — ABNORMAL HIGH (ref 98–111)
Creatinine, Ser: 1.93 mg/dL — ABNORMAL HIGH (ref 0.61–1.24)
GFR, Estimated: 39 mL/min — ABNORMAL LOW (ref >60)
Glucose, Bld: 151 mg/dL — ABNORMAL HIGH (ref 70–99)
Potassium: 4.5 mmol/L (ref 3.5–5.1)
Sodium: 135 mmol/L (ref 135–145)
Total Bilirubin: 1.3 mg/dL — ABNORMAL HIGH (ref 0.3–1.2)
Total Protein: 5.9 g/dL — ABNORMAL LOW (ref 6.5–8.1)

## 2022-04-13 LAB — CBC WITH DIFFERENTIAL/PLATELET
Abs Immature Granulocytes: 0.01 10*3/uL (ref 0.00–0.07)
Basophils Absolute: 0 10*3/uL (ref 0.0–0.1)
Basophils Relative: 1 %
Eosinophils Absolute: 0.2 10*3/uL (ref 0.0–0.5)
Eosinophils Relative: 5 %
HCT: 24.1 % — ABNORMAL LOW (ref 39.0–52.0)
Hemoglobin: 7.9 g/dL — ABNORMAL LOW (ref 13.0–17.0)
Immature Granulocytes: 0 %
Lymphocytes Relative: 14 %
Lymphs Abs: 0.4 10*3/uL — ABNORMAL LOW (ref 0.7–4.0)
MCH: 33.3 pg (ref 26.0–34.0)
MCHC: 32.8 g/dL (ref 30.0–36.0)
MCV: 101.7 fL — ABNORMAL HIGH (ref 80.0–100.0)
Monocytes Absolute: 0.3 10*3/uL (ref 0.1–1.0)
Monocytes Relative: 12 %
Neutro Abs: 1.9 10*3/uL (ref 1.7–7.7)
Neutrophils Relative %: 68 %
Platelets: 53 10*3/uL — ABNORMAL LOW (ref 150–400)
RBC: 2.37 MIL/uL — ABNORMAL LOW (ref 4.22–5.81)
RDW: 14 % (ref 11.5–15.5)
WBC: 2.8 10*3/uL — ABNORMAL LOW (ref 4.0–10.5)
nRBC: 0 % (ref 0.0–0.2)

## 2022-04-13 LAB — IRON AND TIBC
Iron: 50 ug/dL (ref 45–182)
Saturation Ratios: 23 % (ref 17.9–39.5)
TIBC: 221 ug/dL — ABNORMAL LOW (ref 250–450)
UIBC: 171 ug/dL

## 2022-04-13 LAB — FERRITIN: Ferritin: 199 ng/mL (ref 24–336)

## 2022-04-13 LAB — FOLATE: Folate: >40 ng/mL (ref >5.9)

## 2022-04-13 MED FILL — Oxycodone w/ Acetaminophen Tab 5-325 MG: ORAL | Qty: 6 | Status: AC

## 2022-04-14 LAB — HOMOCYSTEINE: Homocysteine: 26.5 umol/L — ABNORMAL HIGH (ref 0.0–17.2)

## 2022-04-15 ENCOUNTER — Ambulatory Visit: Payer: BC Managed Care – PPO | Admitting: Internal Medicine

## 2022-04-19 NOTE — Progress Notes (Unsigned)
Start  Michael Harmon, [place] [address]   CLINIC:  Medical Oncology/Hematology  PCP:  Lindell Spar, MD [address] [place] [address] [phone]   REASON FOR VISIT:  Follow-up for cirrhosis related pancytopenia   PRIOR THERAPY: Platelet transfusion x2 (prior to prostate biopsy and prostate SpaceOAR placement)   CURRENT THERAPY: Intermittent iron infusions (last on 09/03/2021)  INTERVAL HISTORY:  Michael Harmon 61 y.o. male returns for routine follow-up of his cirrhosis related pancytopenia.  My Michael Harmon) last visit with Michael Harmon was on 01/26/2022.  He had bone marrow biopsy on 02/10/2022.  And he was seen by Dr. Delton Harmon on 02/24/2022 to follow-up on his bone marrow biopsy.  At today's visit, he reports feeling somewhat poorly in light of everything going on with his health.   *** No recent hospitalizations, surgeries, or changes in baseline health status.  He reports significant fatigue as well as episodes of lightheadedness. ***  He denies any pica, restless legs, headaches, or syncope. ***  He has not had any chest pain or dyspnea on exertion. ***  He reports easy bruising and occasional petechiae of bilateral legs. ***  He reports that his gums bleed when he brushes his teeth and he has scant dried blood/epistaxis when he wakes in the morning.  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  He denies any hematuria, hematochezia, or melena. ***  He has not noticed any new lumps or bumps. ***  He denies any fevers, chills, or night sweats. ***  He has lost about 15 pounds in the past 3 months, which he attributes to poor appetite.  ***  He feels frustrated with his diagnosis of celiac disease reports that he "cannot eat out," and is too overwhelmed and tired all the time to cook at home.  Therefore, he "does not care to eat that much." ***   He has  *** % energy  and ***  appetite. He endorses that he is maintaining a stable weight. ***    REVIEW OF SYSTEMS:  ***  Review of Systems  Constitutional:  Positive for fatigue. Negative for appetite change, chills, diaphoresis, fever and unexpected weight change.  HENT:   Positive for nosebleeds (Minor). Negative for lump/mass.   Eyes:  Negative for eye problems.  Respiratory:  Positive for cough. Negative for hemoptysis and shortness of breath.   Cardiovascular:  Negative for chest pain, leg swelling and palpitations.  Gastrointestinal:  Positive for diarrhea. Negative for abdominal pain, blood in stool, constipation, nausea and vomiting.  Genitourinary:  Positive for frequency. Negative for hematuria.   Skin: Negative.   Neurological:  Positive for dizziness. Negative for headaches and light-headedness.  Hematological:  Bruises/bleeds easily.  Psychiatric/Behavioral:  Positive for depression and sleep disturbance. The patient is nervous/anxious.       PAST MEDICAL/SURGICAL HISTORY:  Past Medical History:  Diagnosis Date   Allergy    nose and sinus problems   Anemia    Asthma    Celiac disease    Cirrhosis, cryptogenic (HCC)    completed Hep A and B vaccines   DM (diabetes mellitus) ([facility])    GERD (gastroesophageal reflux disease)    Hyperlipidemia    diet controlled now with 100 pound weight loss   Hypertension    Iron deficiency anemia due to chronic blood loss 08/13/2021   Malignant neoplasm of prostate ([facility]) 10/03/2021   Murmur,  heart 03/02/2022   Narrow angle glaucoma suspect of both eyes    Renal cyst 09/03/2017   right   Situational depression 07/06/2017   Splenomegaly    Thrombocytopenia ([facility])    hematology, ?related to chol med (tricor) and glimeripide, just being monitored, above 100,000   Past Surgical History:  Procedure Laterality Date   BIOPSY  07/09/2016   Procedure: BIOPSY;  Surgeon: [name], MD;  [facility];  Service: Endoscopy;;  duodenal, gastric,  esophaageal   BIOPSY  07/31/2021   Procedure: BIOPSY;  Surgeon: [name], MD;  [facility];  Service: Endoscopy;;   CLOSED MANIPULATION SHOULDER     COLONOSCOPY  11/2015   Dr. Posey Pronto: cecal bx neg for microscopic colitis. ascending colon polyp (adenomatous)   COLONOSCOPY WITH PROPOFOL N/A 07/31/2021   Procedure: COLONOSCOPY WITH PROPOFOL;  Surgeon: [name], MD;  [facility];  Service: Endoscopy;  Laterality: N/A;  7:30am   ESOPHAGEAL BANDING N/A 08/04/2017   Procedure: ESOPHAGEAL BANDING;  Surgeon: [name], MD;  [facility];  Service: Endoscopy;  Laterality: N/A;  esophageal varices banding   ESOPHAGEAL BANDING N/A 10/13/2017   Procedure: ESOPHAGEAL BANDING;  Surgeon: [name], MD;  [facility];  Service: Endoscopy;  Laterality: N/A;   ESOPHAGEAL BANDING N/A 04/09/2020   Procedure: ESOPHAGEAL BANDING;  Surgeon: [name], MD;  [facility];  Service: Endoscopy;  Laterality: N/A;   ESOPHAGOGASTRODUODENOSCOPY N/A 07/09/2016   Procedure: ESOPHAGOGASTRODUODENOSCOPY (EGD);  Surgeon: [name], MD;  [facility];  Service: Endoscopy;  Laterality: N/A;  730   ESOPHAGOGASTRODUODENOSCOPY N/A 08/04/2017   Procedure: ESOPHAGOGASTRODUODENOSCOPY (EGD);  Surgeon: [name], MD;  [facility];  Service: Endoscopy;  Laterality: N/A;  7:30am   ESOPHAGOGASTRODUODENOSCOPY N/A 10/13/2017   Dr. [name]: grade 2 esophageal varices status post banding, portal hypertensive gastropathy   ESOPHAGOGASTRODUODENOSCOPY  11/23/2019   [name]: Esophageal varices, 4 columns of grade 2-3 status post banding.  Varices more prominent than seen in December 2018.  Portal gastropathy.  Normal-appearing small bowel.   ESOPHAGOGASTRODUODENOSCOPY (EGD) WITH PROPOFOL N/A 04/09/2020   Dr. [name]: 4 columns of grade 2/grade 3 esophageal varices somewhat recalcitrant.  Status post esophageal band ligation today.  Portal  gastropathy.  Normal-appearing small bowel.   ESOPHAGOGASTRODUODENOSCOPY (EGD) WITH PROPOFOL N/A 07/31/2021   Procedure: ESOPHAGOGASTRODUODENOSCOPY (EGD) WITH PROPOFOL;  Surgeon: [name], MD;  [facility];  Service: Endoscopy;  Laterality: N/A;   GOLD SEED IMPLANT N/A 01/15/2022   Procedure: GOLD SEED IMPLANT;  Surgeon: Cleon Gustin, MD;  Location: [facility];  Service: Urology;  Laterality: N/A;   SPACE OAR INSTILLATION N/A 01/15/2022   Procedure: SPACE OAR INSTILLATION;  Surgeon: Cleon Gustin, MD;  Location: [facility];  Service: Urology;  Laterality: N/A;     SOCIAL HISTORY:  Social History   Socioeconomic History   Marital status: Married    Spouse name: Melissa   Number of children: 0   Years of education: 14   Highest education level: Not on file  Occupational History   Occupation: Customer service manager, [employer], travels  Tobacco Use   Smoking status: Never   Smokeless tobacco: Never  Vaping Use   Vaping Use: Never used  Substance and Sexual Activity   Alcohol use: Not Currently    Comment: rare, 1-2 drinks a month, on vacation   Drug use: No   Sexual  activity: Not Currently  Other Topics Concern   Not on file  Social History Narrative   Lives at home with wife Melissa   One dog.   Has no children.   Eats all food groups.   Works for [employer] and [employer].       Social Determinants of Health   Financial Resource Strain: Not on file  Food Insecurity: Not on file  Transportation Needs: Not on file  Physical Activity: Not on file  Stress: Not on file  Social Connections: Not on file  Intimate Partner Violence: Not on file    FAMILY HISTORY:  Family History  Problem Relation Age of Onset   Other Mother        chronic diarrhea   COPD Mother    Heart disease Mother        died at 35   Arthritis Mother    Cancer Mother    Depression Mother    Diabetes Mother    Hyperlipidemia Mother    Hypertension  Mother    Stroke Mother    Arthritis Father    Asthma Father    Birth defects Father    Heart disease Father        aortic valve replaced   Heart disease Maternal Grandmother    Stroke Maternal Grandmother    Heart disease Maternal Grandfather    Stroke Maternal Grandfather    Diabetes Paternal Grandmother    Stroke Paternal Grandfather    Heart disease Paternal Grandfather    Liver disease Neg Hx    Colon cancer Neg Hx     CURRENT MEDICATIONS:  Outpatient Encounter Medications as of 04/20/2022  Medication Sig Note   acetaminophen (TYLENOL) 325 MG tablet Take 650 mg by mouth every 6 (six) hours as needed for moderate pain.    atorvastatin (LIPITOR) 20 MG tablet Take 1 tablet (20 mg total) by mouth daily. (Patient taking differently: Take 20 mg by mouth in the morning.)    BD PEN NEEDLE NANO 2ND GEN 32G X 4 MM MISC USE TO INJECT INSULIN 4 TIMES DAILY E11.65    benazepril-hydrochlorthiazide (LOTENSIN HCT) 10-12.5 MG tablet Take 1 tablet by mouth in the morning.    Cholecalciferol (VITAMIN D3) 125 MCG (5000 UT) TABS Take 5,000 Units by mouth in the morning.    Continuous Blood Gluc Receiver (FREESTYLE LIBRE 2 READER) DEVI USE AS DIRECTED TO TEST GLUCOSE LEVELS AT LEAST 4 TIMES DAILY E11.9    Continuous Blood Gluc Sensor (FREESTYLE LIBRE 2 SENSOR) MISC USE AS DIRECTED FOR BLOOD SUGAR MONITORING. CHANGE EVERY 14 DAYS    furosemide (LASIX) 20 MG tablet Take 1 tablet (20 mg total) by mouth daily.    Glucagon (GVOKE HYPOPEN 2-PACK) 1 MG/0.2ML SOAJ Inject 1 mg into the skin as needed (Blood glucose < 53).    HUMALOG KWIKPEN 100 UNIT/ML KwikPen Inject 10-16 Units into the skin with breakfast, with lunch, and with evening meal. 02/10/2022: 12 UNITS   hydrocortisone (ANUSOL-HC) 25 MG suppository Place 1 suppository (25 mg total) rectally 2 (two) times daily as needed for hemorrhoids or anal itching.    LEVEMIR FLEXTOUCH 100 UNIT/ML Pen Inject 32 Units into the skin at bedtime.    Multiple Vitamin  (MULTIVITAMIN WITH MINERALS) TABS tablet Take 1 tablet by mouth in the morning.    oxyCODONE-acetaminophen (PERCOCET/ROXICET) 5-325 MG tablet Take 1 tablet by mouth every 8 (eight) hours as needed for severe pain.    Tamsulosin HCl (FLOMAX PO)  Take by mouth.    traMADol (ULTRAM) 50 MG tablet Take 1 tablet (50 mg total) by mouth every 6 (six) hours as needed.    No facility-administered encounter medications on file as of 04/20/2022.    ALLERGIES:  Allergies  Allergen Reactions   Penicillins Other (See Comments)    As a baby, unknown reaction Did it involve swelling of the face/tongue/throat, SOB, or low BP? Unknown Did it involve sudden or severe rash/hives, skin peeling, or any reaction on the inside of your mouth or nose? Unknown Did you need to seek medical attention at a hospital or doctor's office? Unknown When did it last happen?      infant allergy If all above answers are "NO", may proceed with cephalosporin use. .    Gluten Meal Diarrhea   Biaxin [Clarithromycin] Tinitus    Ears ringing, loss sense of smell   Prednisone Hives    Made blood sugar high     PHYSICAL EXAM:  ECOG PERFORMANCE STATUS: 1 - Symptomatic but completely ambulatory ***   There were no vitals filed for this visit. There were no vitals filed for this visit. Physical Exam Constitutional:      Appearance: Normal appearance. He is obese.  HENT:     Head: Normocephalic and atraumatic.     Mouth/Throat:     Mouth: Mucous membranes are moist.  Eyes:     Extraocular Movements: Extraocular movements intact.     Pupils: Pupils are equal, round, and reactive to light.  Cardiovascular:     Rate and Rhythm: Normal rate and regular rhythm.     Pulses: Normal pulses.     Heart sounds: Normal heart sounds.  Pulmonary:     Effort: Pulmonary effort is normal.     Breath sounds: Normal breath sounds.  Abdominal:     General: Bowel sounds are normal. There is distension.     Palpations: Abdomen is soft.      Tenderness: There is no abdominal tenderness.  Musculoskeletal:        General: No swelling.     Right lower leg: No edema.     Left lower leg: No edema.  Lymphadenopathy:     Cervical: No cervical adenopathy.  Skin:    General: Skin is warm and dry.     Comments: Spider angiomas and telangiectasias on face and neck  Neurological:     General: No focal deficit present.     Mental Status: He is alert and oriented to person, place, and time.  Psychiatric:        Mood and Affect: Mood normal.        Behavior: Behavior normal.      LABORATORY DATA:  I have reviewed the labs as listed.  CBC    Component Value Date/Time   WBC 2.8 (L) 04/13/2022 1033   RBC 2.37 (L) 04/13/2022 1033   HGB 7.9 (L) 04/13/2022 1033   HGB 11.1 (L) 09/02/2021 1655   HCT 24.1 (L) 04/13/2022 1033   HCT 32.9 (L) 09/02/2021 1655   PLT 53 (L) 04/13/2022 1033   PLT 43 (LL) 09/02/2021 1655   MCV 101.7 (H) 04/13/2022 1033   MCV 96 09/02/2021 1655   MCH 33.3 04/13/2022 1033   MCHC 32.8 04/13/2022 1033   RDW 14.0 04/13/2022 1033   RDW 14.0 09/02/2021 1655   LYMPHSABS 0.4 (L) 04/13/2022 1033   LYMPHSABS 1.2 09/02/2021 1655   MONOABS 0.3 04/13/2022 1033   EOSABS 0.2 04/13/2022 1033  EOSABS 0.3 09/02/2021 1655   BASOSABS 0.0 04/13/2022 1033   BASOSABS 0.0 09/02/2021 1655      Latest Ref Rng & Units 04/13/2022   10:33 AM 04/10/2022    8:06 PM 08/13/2021    8:51 AM  CMP  Glucose 70 - 99 mg/dL 151  131  138   BUN 8 - 23 mg/dL 39  32  15   Creatinine 0.61 - 1.24 mg/dL 1.93  1.65  1.04   Sodium 135 - 145 mmol/L 135  135  136   Potassium 3.5 - 5.1 mmol/L 4.5  4.1  4.8   Chloride 98 - 111 mmol/L 112  110  110   CO2 22 - 32 mmol/L 19  19  21    Calcium 8.9 - 10.3 mg/dL 7.9  8.4  8.8   Total Protein 6.5 - 8.1 g/dL 5.9  6.2  6.7   Total Bilirubin 0.3 - 1.2 mg/dL 1.3  1.5  1.4   Alkaline Phos 38 - 126 U/L 159  161  130   AST 15 - 41 U/L 49  57  50   ALT 0 - 44 U/L 34  38  40     DIAGNOSTIC IMAGING:  I  have independently reviewed the relevant imaging and discussed with the patient.  ASSESSMENT & PLAN: 1.  Thrombocytopenia and leukopenia, secondary to cirrhosis and splenomegaly - Platelet count has slowly down trended in the past 5 years.  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  However, no abdominal work-up was done at the time, but given the fact the patient was diagnosed with cirrhosis in 2017, he likely had some element of liver disease and splenomegaly in 2015, which would explain his thrombocytopenia and cast some suspicion as to whether or not he ever had any ITP. - Abdominal ultrasound (09/17/2021): Cirrhotic liver and splenomegaly (15.4 cm in length) - CT scan (11/14/2021 at [facility]) showed enlarged spleen measuring 17 cm - Work-up negative for other causes of thrombocytopenia - normal B12, methylmalonic acid, folate, copper; negative rheumatoid factor/ANA; no abnormal platelets noted on pathology smear review - Received platelet transfusion x1 on 08/18/2021 prior to prostate biopsy.  Blood transfusion x1 on 01/15/2022 prior to prostate SpaceOAR placement - Bone marrow biopsy (02/10/2022) shows hypocellular marrow for age with trilineage lineage hematopoiesis.  No increase in blasts.  No evidence of metastatic carcinoma. - Admits to easy bruising, gum-bleeding, scant epistaxis, and bleeding scrotal angiokeratomas (following with urology).  He describes possible petechial rash that occurs intermittently in his bilateral lower extremities. ***  - He has not had any major bleeding events such as bright red blood per rectum or melena. *** - No B symptoms or frequent infections *** - Most recent CBC (04/13/2022): WBC 2.8/ALC 0.4, platelets 53 - PLAN: Differential diagnosis favors  thrombocytopenia and leukopenia from cirrhosis and splenomegaly, unable to rule out ITP at this time.  Cytopenias will be further complicated by prostate radiation.  *** - No indication for treatment at this time.  We will continue to monitor closely with repeat CBC and RTC in 8 weeks.  *** - Due to questionable history of ITP, could also consider trial of steroids before pursuing splenic artery embolization.  If platelets count is between 30-40,000, would consider trial of pulsed dexamethasone 40 mg x 4  days to see if he gets good response.  (He had a problem with prednisone in the past with elevated blood sugars) - No indication for treatment of thrombocytopenia at this time, but he is approaching the level at which he would benefit from splenic artery embolization. Prior to any splenic intervention, though, would discuss with [facility] hepatologist ***  2.  Normocytic anemia with iron deficiency - Patient denies any major bleeding events, but EGD/colonoscopy on 07/31/2021 showed moderately friable portal colopathy as well as gastric erosions and grade 2 and grade 3 varices - EGD (12/26/2021 via [facility]): Grade 2 esophageal varices, banded x3; old blood noted in stomach; mild portal hypertensive gastropathy - Other work-up (08/18/2021) showed normal reticulocytes; normal B12, methylmalonic acid, folate, homocysteine, and copper.  Immunofixation and SPEP negative. - Patient reports that he has been diagnosed with celiac disease by Dr. [name] - He received IV Venofer x1000 mg, last dose given 09/03/2021 - He is symptomatic with fatigue.   ***  - He denies any obvious GI bleeding such as melena or bright red blood per rectum ***  - Most recent labs (04/13/2022): Decreased Hgb 7.9/MCV 101.7.  Normal ferritin 199 with low TIBC and normal iron saturation 23%.  Folate normal, homocystine remains mildly elevated. - Differential diagnosis favors iron deficiency anemia of mixed etiology - patient has likely malabsorption  in the setting of celiac disease and is also at high risk for chronic occult GI bleeding.  He may have some worsening anemia related to his recent radiotherapy. - PLAN: No indication for IV iron at this time. 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 ***   3. Weight loss ***  - He has lost about 15 pounds in the past 3 months, which he attributes to poor appetite.  He feels frustrated with his diagnosis of celiac disease reports that he "cannot eat out," and is too overwhelmed and tired all the time to cook at home.  Therefore, he "does not care to eat that much." - PLAN: Referral sent to nutritionist  4.  Cirrhosis with esophageal varices - Diagnosed in 2017 - Patient had EGD/colonoscopy on 07/31/2021 which showed portal colopathy as well as gastric erosions and grade 3 varices, which were not banded due to low platelet count - EGD (12/26/2021 via [facility]): Grade 2 esophageal varices, banded x3; old blood noted in stomach; mild portal hypertensive gastropathy - PLAN: Continue follow-up with hepatologist at [facility]. - Patient informed that he would likely need transfusion of platelets x1 unit if he has platelets of less than 50 at the time of the future EGD/banding procedures.   5.  Stage T1c adenocarcinoma of the prostate, Gleason 3+4 - Diagnosed in October 2022 with prostate biopsy after abnormal PSA (8.2) - Following with urologist (Dr. Junious Silk / Dr. Alyson Ingles) and has also been seen by radiation oncology (Ashlyn Bruning PA-C / Dr. Tyler Pita) - He had SpaceOAR placement on 01/15/2022 - Patient is undergoing XRT in [place], which was completed on 03/13/2022. - He is receiving Lupron shots - PLAN: Patient to continue follow-up and treatment as per urology and radiation oncology.  He will contact us prior to any procedures in order to receive platelets if needed.   PLAN SUMMARY &  DISPOSITION:  ***   All questions were answered. The patient knows to call the clinic with any problems, questions or concerns.  Medical decision  making: Moderate ***   Time spent on visit: I spent 25 minutes counseling the patient face to face. The total time spent in the appointment was 40 minutes and more than 50% was on counseling.   Harriett Rush, PA-C   ***

## 2022-04-20 ENCOUNTER — Inpatient Hospital Stay (HOSPITAL_BASED_OUTPATIENT_CLINIC_OR_DEPARTMENT_OTHER): Payer: BC Managed Care – PPO | Admitting: Physician Assistant

## 2022-04-20 ENCOUNTER — Inpatient Hospital Stay (HOSPITAL_COMMUNITY): Payer: BC Managed Care – PPO

## 2022-04-20 VITALS — BP 133/55 | HR 89 | Temp 97.6°F | Resp 18 | Wt 220.0 lb

## 2022-04-20 DIAGNOSIS — D500 Iron deficiency anemia secondary to blood loss (chronic): Secondary | ICD-10-CM

## 2022-04-20 DIAGNOSIS — D61818 Other pancytopenia: Secondary | ICD-10-CM | POA: Diagnosis not present

## 2022-04-20 DIAGNOSIS — D708 Other neutropenia: Secondary | ICD-10-CM

## 2022-04-20 DIAGNOSIS — D649 Anemia, unspecified: Secondary | ICD-10-CM

## 2022-04-20 DIAGNOSIS — E7211 Homocystinuria: Secondary | ICD-10-CM

## 2022-04-20 DIAGNOSIS — D696 Thrombocytopenia, unspecified: Secondary | ICD-10-CM | POA: Diagnosis not present

## 2022-04-20 LAB — CBC WITH DIFFERENTIAL/PLATELET
Abs Immature Granulocytes: 0.02 10*3/uL (ref 0.00–0.07)
Basophils Absolute: 0 10*3/uL (ref 0.0–0.1)
Basophils Relative: 1 %
Eosinophils Absolute: 0.2 10*3/uL (ref 0.0–0.5)
Eosinophils Relative: 6 %
HCT: 25.6 % — ABNORMAL LOW (ref 39.0–52.0)
Hemoglobin: 8.6 g/dL — ABNORMAL LOW (ref 13.0–17.0)
Immature Granulocytes: 1 %
Lymphocytes Relative: 15 %
Lymphs Abs: 0.5 10*3/uL — ABNORMAL LOW (ref 0.7–4.0)
MCH: 34.5 pg — ABNORMAL HIGH (ref 26.0–34.0)
MCHC: 33.6 g/dL (ref 30.0–36.0)
MCV: 102.8 fL — ABNORMAL HIGH (ref 80.0–100.0)
Monocytes Absolute: 0.4 10*3/uL (ref 0.1–1.0)
Monocytes Relative: 14 %
Neutro Abs: 1.9 10*3/uL (ref 1.7–7.7)
Neutrophils Relative %: 63 %
Platelets: 59 10*3/uL — ABNORMAL LOW (ref 150–400)
RBC: 2.49 MIL/uL — ABNORMAL LOW (ref 4.22–5.81)
RDW: 13.9 % (ref 11.5–15.5)
WBC: 3 10*3/uL — ABNORMAL LOW (ref 4.0–10.5)
nRBC: 0 % (ref 0.0–0.2)

## 2022-04-20 LAB — SAMPLE TO BLOOD BANK

## 2022-04-20 NOTE — Patient Instructions (Signed)
[place] at [facility] Discharge Instructions  You were seen today by Tarri Abernethy PA-C for your anemia and low platelets.  Your anemia has worsened since your last visit, with most recent hemoglobin 7.9.  (Iron levels are normal) -We will check blood count today to see if you need a blood transfusion (if hemoglobin <7.0) - We will check your blood once a month for the next 2 months  Your platelets are stable at 53.  You do not need any treatment for your low platelets at this time.  Continue to follow closely with your liver doctor due to worsening symptoms of liver cirrhosis.  Seek IMMEDIATE medical attention if you have any bloody vomit, black tarry bowel movements, or bright red blood in the toilet, or any other signs of uncontrolled bleeding.  FOLLOW-UP APPOINTMENT: Same-day labs and office visit in 2 months   Thank you for choosing [place] at [facility] to provide your oncology and hematology care.  To afford each patient quality time with our provider, please arrive at least 15 minutes before your scheduled appointment time.   If you have a lab appointment with the [facility] please come in thru the Main Entrance and check in at the main information desk.  You need to re-schedule your appointment should you arrive 10 or more minutes late.  We strive to give you quality time with our providers, and arriving late affects you and other patients whose appointments are after yours.  Also, if you no show three or more times for appointments you may be dismissed from the clinic at the providers discretion.     Again, thank you for choosing [facility].  Our hope is that these requests will decrease the amount of time that you wait before being seen by our physicians.       _____________________________________________________________  Should you have questions after your visit to [facility], please  contact our office at [phone] and follow the prompts.  Our office hours are 8:00 a.m. and 4:30 p.m. Monday - Friday.  Please note that voicemails left after 4:00 p.m. may not be returned until the following business day.  We are closed weekends and major holidays.  You do have access to a nurse 24-7, just call the main number to the clinic [phone] and do not press any options, hold on the line and a nurse will answer the phone.    For prescription refill requests, have your pharmacy contact our office and allow 72 hours.    Due to Covid, you will need to wear a mask upon entering the hospital. If you do not have a mask, a mask will be given to you at the Main Entrance upon arrival. For doctor visits, patients may have 1 support person age 31 or older with them. For treatment visits, patients can not have anyone with them due to social distancing guidelines and our immunocompromised population.

## 2022-04-20 NOTE — Progress Notes (Signed)
Patient called.  Patient aware and 1 month lab appointment made.

## 2022-04-21 ENCOUNTER — Encounter (HOSPITAL_COMMUNITY): Payer: Self-pay | Admitting: Hematology

## 2022-04-28 ENCOUNTER — Encounter: Payer: Self-pay | Admitting: Internal Medicine

## 2022-04-28 ENCOUNTER — Ambulatory Visit: Payer: BC Managed Care – PPO | Admitting: Internal Medicine

## 2022-04-28 VITALS — BP 125/65 | HR 79 | Temp 97.3°F | Ht 67.0 in | Wt 213.2 lb

## 2022-04-28 DIAGNOSIS — K7460 Unspecified cirrhosis of liver: Secondary | ICD-10-CM | POA: Diagnosis not present

## 2022-04-28 DIAGNOSIS — R188 Other ascites: Secondary | ICD-10-CM

## 2022-04-28 DIAGNOSIS — K219 Gastro-esophageal reflux disease without esophagitis: Secondary | ICD-10-CM

## 2022-04-28 DIAGNOSIS — I8510 Secondary esophageal varices without bleeding: Secondary | ICD-10-CM | POA: Diagnosis not present

## 2022-04-28 DIAGNOSIS — I8500 Esophageal varices without bleeding: Secondary | ICD-10-CM

## 2022-04-28 DIAGNOSIS — R601 Generalized edema: Secondary | ICD-10-CM

## 2022-05-15 ENCOUNTER — Other Ambulatory Visit (HOSPITAL_COMMUNITY): Payer: Self-pay | Admitting: Gastroenterology

## 2022-05-15 ENCOUNTER — Other Ambulatory Visit: Payer: Self-pay | Admitting: Gastroenterology

## 2022-05-15 DIAGNOSIS — K7469 Other cirrhosis of liver: Secondary | ICD-10-CM

## 2022-05-15 DIAGNOSIS — R188 Other ascites: Secondary | ICD-10-CM

## 2022-05-20 ENCOUNTER — Inpatient Hospital Stay (HOSPITAL_COMMUNITY): Payer: BC Managed Care – PPO

## 2022-05-20 ENCOUNTER — Inpatient Hospital Stay (HOSPITAL_COMMUNITY): Payer: BC Managed Care – PPO | Attending: Hematology

## 2022-05-20 DIAGNOSIS — D500 Iron deficiency anemia secondary to blood loss (chronic): Secondary | ICD-10-CM

## 2022-05-20 DIAGNOSIS — E7211 Homocystinuria: Secondary | ICD-10-CM

## 2022-05-20 DIAGNOSIS — D696 Thrombocytopenia, unspecified: Secondary | ICD-10-CM | POA: Diagnosis present

## 2022-05-20 DIAGNOSIS — D708 Other neutropenia: Secondary | ICD-10-CM

## 2022-05-20 DIAGNOSIS — D72819 Decreased white blood cell count, unspecified: Secondary | ICD-10-CM | POA: Diagnosis not present

## 2022-05-20 DIAGNOSIS — D649 Anemia, unspecified: Secondary | ICD-10-CM

## 2022-05-20 LAB — COMPREHENSIVE METABOLIC PANEL
ALT: 48 U/L — ABNORMAL HIGH (ref 0–44)
AST: 75 U/L — ABNORMAL HIGH (ref 15–41)
Albumin: 3.8 g/dL (ref 3.5–5.0)
Alkaline Phosphatase: 167 U/L — ABNORMAL HIGH (ref 38–126)
Anion gap: 5 (ref 5–15)
BUN: 43 mg/dL — ABNORMAL HIGH (ref 8–23)
CO2: 20 mmol/L — ABNORMAL LOW (ref 22–32)
Calcium: 8.8 mg/dL — ABNORMAL LOW (ref 8.9–10.3)
Chloride: 111 mmol/L (ref 98–111)
Creatinine, Ser: 1.44 mg/dL — ABNORMAL HIGH (ref 0.61–1.24)
GFR, Estimated: 55 mL/min — ABNORMAL LOW (ref >60)
Glucose, Bld: 116 mg/dL — ABNORMAL HIGH (ref 70–99)
Potassium: 4.6 mmol/L (ref 3.5–5.1)
Sodium: 136 mmol/L (ref 135–145)
Total Bilirubin: 1.6 mg/dL — ABNORMAL HIGH (ref 0.3–1.2)
Total Protein: 6.5 g/dL (ref 6.5–8.1)

## 2022-05-20 LAB — CBC WITH DIFFERENTIAL/PLATELET
Abs Immature Granulocytes: 0 10*3/uL (ref 0.00–0.07)
Basophils Absolute: 0 10*3/uL (ref 0.0–0.1)
Basophils Relative: 1 %
Eosinophils Absolute: 0.2 10*3/uL (ref 0.0–0.5)
Eosinophils Relative: 9 %
HCT: 25.8 % — ABNORMAL LOW (ref 39.0–52.0)
Hemoglobin: 8.5 g/dL — ABNORMAL LOW (ref 13.0–17.0)
Immature Granulocytes: 0 %
Lymphocytes Relative: 15 %
Lymphs Abs: 0.4 10*3/uL — ABNORMAL LOW (ref 0.7–4.0)
MCH: 33.9 pg (ref 26.0–34.0)
MCHC: 32.9 g/dL (ref 30.0–36.0)
MCV: 102.8 fL — ABNORMAL HIGH (ref 80.0–100.0)
Monocytes Absolute: 0.3 10*3/uL (ref 0.1–1.0)
Monocytes Relative: 12 %
Neutro Abs: 1.6 10*3/uL — ABNORMAL LOW (ref 1.7–7.7)
Neutrophils Relative %: 63 %
Platelets: 40 10*3/uL — ABNORMAL LOW (ref 150–400)
RBC: 2.51 MIL/uL — ABNORMAL LOW (ref 4.22–5.81)
RDW: 14.7 % (ref 11.5–15.5)
Smear Review: NORMAL
WBC: 2.6 10*3/uL — ABNORMAL LOW (ref 4.0–10.5)
nRBC: 0 % (ref 0.0–0.2)

## 2022-05-20 LAB — SAMPLE TO BLOOD BANK

## 2022-05-25 ENCOUNTER — Ambulatory Visit (HOSPITAL_COMMUNITY)
Admission: RE | Admit: 2022-05-25 | Discharge: 2022-05-25 | Disposition: A | Discharge status: Home / Self Care | Payer: BC Managed Care – PPO | Source: Ambulatory Visit | Attending: Gastroenterology | Admitting: Gastroenterology

## 2022-05-25 ENCOUNTER — Encounter (HOSPITAL_COMMUNITY): Payer: Self-pay

## 2022-05-25 DIAGNOSIS — R188 Other ascites: Secondary | ICD-10-CM | POA: Insufficient documentation

## 2022-05-25 DIAGNOSIS — K7469 Other cirrhosis of liver: Secondary | ICD-10-CM | POA: Insufficient documentation

## 2022-05-25 MED ORDER — ALBUMIN HUMAN 25 % IV SOLN: 50.0000 g | Freq: Once | INTRAVENOUS | Status: AC

## 2022-05-25 MED ORDER — ALBUMIN HUMAN 25 % IV SOLN
INTRAVENOUS | Status: AC
  Administered 2022-05-25: 50 g via INTRAVENOUS
  Filled 2022-05-25: qty 200

## 2022-05-25 NOTE — Progress Notes (Signed)
PT tolerated right sided paracentesis procedure and 50 G of IV albumin well today and 3.4 Liters of clear yellow ascites removed. PT verbalized understanding of discharge instructions and ambulatory at departure with no acute distress noted.

## 2022-05-25 NOTE — Procedures (Signed)
PreOperative Dx: Cirrhosis, ascites Postoperative Dx: Cirrhosis, ascites Procedure:   US guided paracentesis Radiologist:  Thornton Papas Anesthesia:  10 ml of1% lidocaine Specimen:  3.7 L of yellow ascitic fluid EBL:   < 1 ml Complications: None

## 2022-05-26 ENCOUNTER — Other Ambulatory Visit: Payer: Self-pay | Admitting: Gastroenterology

## 2022-05-26 ENCOUNTER — Other Ambulatory Visit (HOSPITAL_COMMUNITY): Payer: Self-pay | Admitting: Gastroenterology

## 2022-05-26 DIAGNOSIS — K7469 Other cirrhosis of liver: Secondary | ICD-10-CM

## 2022-05-28 ENCOUNTER — Ambulatory Visit (HOSPITAL_COMMUNITY): Admission: RE | Admit: 2022-05-28 | Payer: BC Managed Care – PPO | Source: Ambulatory Visit

## 2022-06-01 ENCOUNTER — Other Ambulatory Visit (HOSPITAL_COMMUNITY): Payer: BC Managed Care – PPO

## 2022-06-09 ENCOUNTER — Ambulatory Visit (HOSPITAL_COMMUNITY): Admission: RE | Admit: 2022-06-09 | Payer: BC Managed Care – PPO | Source: Ambulatory Visit

## 2022-06-10 ENCOUNTER — Encounter (HOSPITAL_COMMUNITY): Payer: Self-pay

## 2022-06-10 ENCOUNTER — Ambulatory Visit (HOSPITAL_COMMUNITY)
Admission: RE | Admit: 2022-06-10 | Discharge: 2022-06-10 | Disposition: A | Discharge status: Home / Self Care | Payer: BC Managed Care – PPO | Source: Ambulatory Visit | Attending: Gastroenterology | Admitting: Gastroenterology

## 2022-06-10 DIAGNOSIS — K7469 Other cirrhosis of liver: Secondary | ICD-10-CM | POA: Diagnosis not present

## 2022-06-10 HISTORY — PX: US PARACENTESIS: IMG2253

## 2022-06-10 MED ORDER — ALBUMIN HUMAN 25 % IV SOLN: 50.0000 g | Freq: Once | INTRAVENOUS | Status: AC

## 2022-06-10 MED ORDER — ALBUMIN HUMAN 25 % IV SOLN
INTRAVENOUS | Status: AC
  Administered 2022-06-10: 50 g via INTRAVENOUS
  Filled 2022-06-10: qty 200

## 2022-06-10 NOTE — Procedures (Signed)
  US guided LLQ paracentesis  5 liters clear yellow fluid obtained No labs per MD  Tolerated well  EBL: less than 1 cc

## 2022-06-10 NOTE — Progress Notes (Signed)
Paracentesis complete no signs of distress.

## 2022-06-15 ENCOUNTER — Other Ambulatory Visit (HOSPITAL_COMMUNITY): Payer: Self-pay | Admitting: Gastroenterology

## 2022-06-16 ENCOUNTER — Other Ambulatory Visit (HOSPITAL_COMMUNITY): Payer: Self-pay | Admitting: Gastroenterology

## 2022-06-16 ENCOUNTER — Ambulatory Visit (HOSPITAL_COMMUNITY)
Admission: RE | Admit: 2022-06-16 | Discharge: 2022-06-16 | Disposition: A | Discharge status: Home / Self Care | Payer: BC Managed Care – PPO | Source: Ambulatory Visit | Attending: Gastroenterology | Admitting: Gastroenterology

## 2022-06-16 DIAGNOSIS — K7469 Other cirrhosis of liver: Secondary | ICD-10-CM

## 2022-06-18 ENCOUNTER — Encounter (HOSPITAL_COMMUNITY): Payer: Self-pay

## 2022-06-18 ENCOUNTER — Other Ambulatory Visit (HOSPITAL_COMMUNITY): Payer: Self-pay | Admitting: Gastroenterology

## 2022-06-18 ENCOUNTER — Ambulatory Visit (HOSPITAL_COMMUNITY)
Admission: RE | Admit: 2022-06-18 | Discharge: 2022-06-18 | Disposition: A | Discharge status: Home / Self Care | Payer: BC Managed Care – PPO | Source: Ambulatory Visit | Attending: Gastroenterology | Admitting: Gastroenterology

## 2022-06-18 DIAGNOSIS — K7469 Other cirrhosis of liver: Secondary | ICD-10-CM | POA: Insufficient documentation

## 2022-06-18 NOTE — Progress Notes (Signed)
PT tolerated right sided paracentesis procedure well today and 5 Liters of clear yellow fluid removed. PT verbalized understanding of discharge instructions and ambulatory at departure with no acute distress noted.

## 2022-06-18 NOTE — Procedures (Signed)
PROCEDURE SUMMARY:  Successful US guided paracentesis from RLQ.  Yielded 5L of yellow fluid.  No immediate complications.  Pt tolerated well.   Specimen not sent for labs.  EBL < 36m  Dafna Romo PA-C 06/18/2022 1:06 PM

## 2022-06-22 ENCOUNTER — Ambulatory Visit (INDEPENDENT_AMBULATORY_CARE_PROVIDER_SITE_OTHER): Payer: BC Managed Care – PPO | Admitting: Urology

## 2022-06-22 VITALS — BP 119/53 | HR 90

## 2022-06-22 DIAGNOSIS — C61 Malignant neoplasm of prostate: Secondary | ICD-10-CM

## 2022-06-22 DIAGNOSIS — N189 Chronic kidney disease, unspecified: Secondary | ICD-10-CM | POA: Diagnosis not present

## 2022-06-22 DIAGNOSIS — Z8546 Personal history of malignant neoplasm of prostate: Secondary | ICD-10-CM | POA: Diagnosis not present

## 2022-06-22 LAB — URINALYSIS, ROUTINE W REFLEX MICROSCOPIC
Bilirubin, UA: NEGATIVE
Glucose, UA: NEGATIVE
Ketones, UA: NEGATIVE
Leukocytes,UA: NEGATIVE
Nitrite, UA: NEGATIVE
Protein,UA: NEGATIVE
RBC, UA: NEGATIVE
Specific Gravity, UA: 1.02 (ref 1.005–1.030)
Urobilinogen, Ur: 0.2 mg/dL (ref 0.2–1.0)
pH, UA: 5 (ref 5.0–7.5)

## 2022-06-22 NOTE — Progress Notes (Unsigned)
06/22/2022 9:47 AM   Michael Harmon [birth year] [id number]  Referring provider: Lindell Spar, MD [address] [place],  [place] [address]  No chief complaint on file.   HPI:  Follow-up-   1) prostate cancer-patient diagnosed with favorable intermediate risk prostate cancer October 2022 and was treated with ST-ADT (Jan 2023 - Jul 2023) and IMRT with Dr. Joellyn Rued at [facility]. CT and bone scan were reported as negative Jan 2023 at [facility] with Dr. Nada Boozer due to PSA up to 13.    Biopsy: October 2022 PSA 8.2 (PSA density 0.13) - repeat PSA was 13  T1c Prostate 63 g Gleason 3+4=7, 1 core, 30% (Gleason 4 component 40%) Gleason 3+3=6, 1 core, 10% Staging: Jan 2023 - [facility] - Bone scan and CT scan - negative    Has NASH and cirrhosis - thrombocytopenia (platelets 52 k) and anemia.  His platelets were 43,000 prior to the biopsy and he had to undergo platelet transfusion prior to biopsy under the care of PA Tarri Abernethy with oncology - her excellent care was greatly appreciated. His MELD score is 7 - per pt. He gets a c-scope once a year for banding.    His dad had PCa.        2) BPH, urgency - prostate 63 g on [place]. AUASS =12. Took tamsulosin in the past. He has urgency and UUI. He recalls a prior PSA of "3-4" and took something to lower it. No constipation. No NG risk.  PVR 86 ml.     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     Today, seen for the above. He completed radiation May 2023 when a PSA was 0.1. He wasn't "feeling well" for a month. He started retaining fluid. His kidney function worsened and MELD score rose. Cr was 2.0. He was admitted to [facility]. Dr. Theodoro Clock thought it is underlying kidney disease and held benazepril/HCTZ. Cr was 1.3 on Jun 04, 2022. He is on transplant list. Paracentesis PRN. Voiding adequately. AUASS = 3. No frequency or gross hematuria.    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  PMH: Past Medical History:  Diagnosis Date   Allergy    nose and sinus problems   Anemia    Asthma    Celiac disease    Cirrhosis, cryptogenic (HCC)    completed Hep A and B vaccines   DM (diabetes mellitus) (HCC)    GERD (gastroesophageal reflux disease)    Hyperlipidemia    diet controlled now with 100 pound weight loss   Hypertension    Iron deficiency anemia due to chronic blood loss 08/13/2021   Malignant neoplasm of prostate ([place]) 10/03/2021   Murmur, heart 03/02/2022   Narrow angle glaucoma suspect of both eyes    Renal cyst 09/03/2017   right   Situational depression 07/06/2017   Splenomegaly    Thrombocytopenia ([place])    hematology, ?related to chol med (tricor) and glimeripide, just being monitored, above 100,000    Surgical History: Past Surgical History:  Procedure Laterality Date   BIOPSY  07/09/2016   Procedure: BIOPSY;  Surgeon: [name], MD;  [facility];  Service: Endoscopy;;  duodenal, gastric, esophaageal   BIOPSY  07/31/2021   Procedure: BIOPSY;  Surgeon: [name], MD;  [facility];  Service: Endoscopy;;  CLOSED MANIPULATION SHOULDER     COLONOSCOPY  11/2015   Dr. Posey Pronto: cecal bx neg for microscopic colitis. ascending colon polyp (adenomatous)   COLONOSCOPY WITH PROPOFOL N/A 07/31/2021   Procedure: COLONOSCOPY WITH PROPOFOL;  Surgeon: [name], MD;  [facility];  Service: Endoscopy;  Laterality: N/A;  7:30am   ESOPHAGEAL BANDING N/A 08/04/2017   Procedure: ESOPHAGEAL BANDING;  Surgeon: [name], MD;  [facility];  Service: Endoscopy;  Laterality: N/A;  esophageal varices banding   ESOPHAGEAL BANDING N/A 10/13/2017   Procedure: ESOPHAGEAL BANDING;  Surgeon: [name], MD;  [facility];  Service: Endoscopy;  Laterality: N/A;   ESOPHAGEAL BANDING N/A 04/09/2020   Procedure: ESOPHAGEAL  BANDING;  Surgeon: [name], MD;  [facility];  Service: Endoscopy;  Laterality: N/A;   ESOPHAGOGASTRODUODENOSCOPY N/A 07/09/2016   Procedure: ESOPHAGOGASTRODUODENOSCOPY (EGD);  Surgeon: [name], MD;  [facility];  Service: Endoscopy;  Laterality: N/A;  730   ESOPHAGOGASTRODUODENOSCOPY N/A 08/04/2017   Procedure: ESOPHAGOGASTRODUODENOSCOPY (EGD);  Surgeon: [name], MD;  [facility];  Service: Endoscopy;  Laterality: N/A;  7:30am   ESOPHAGOGASTRODUODENOSCOPY N/A 10/13/2017   Dr. [name]: grade 2 esophageal varices status post banding, portal hypertensive gastropathy   ESOPHAGOGASTRODUODENOSCOPY  11/23/2019   [name]: Esophageal varices, 4 columns of grade 2-3 status post banding.  Varices more prominent than seen in December 2018.  Portal gastropathy.  Normal-appearing small bowel.   ESOPHAGOGASTRODUODENOSCOPY (EGD) WITH PROPOFOL N/A 04/09/2020   Dr. [name]: 4 columns of grade 2/grade 3 esophageal varices somewhat recalcitrant.  Status post esophageal band ligation today.  Portal gastropathy.  Normal-appearing small bowel.   ESOPHAGOGASTRODUODENOSCOPY (EGD) WITH PROPOFOL N/A 07/31/2021   Procedure: ESOPHAGOGASTRODUODENOSCOPY (EGD) WITH PROPOFOL;  Surgeon: [name], MD;  [facility];  Service: Endoscopy;  Laterality: N/A;   GOLD SEED IMPLANT N/A 01/15/2022   Procedure: GOLD SEED IMPLANT;  Surgeon: Cleon Gustin, MD;  Location: [facility];  Service: Urology;  Laterality: N/A;   SPACE OAR INSTILLATION N/A 01/15/2022   Procedure: SPACE OAR INSTILLATION;  Surgeon: Cleon Gustin, MD;  Location: [facility];  Service: Urology;  Laterality: N/A;   US PARACENTESIS  06/10/2022    Home Medications:  Allergies as of 06/22/2022       Reactions   Penicillins Other (See Comments)   As a baby, unknown reaction Did it involve swelling of the face/tongue/throat, SOB, or low BP? Unknown Did it involve sudden or severe rash/hives, skin peeling, or  any reaction on the inside of your mouth or nose? Unknown Did you need to seek medical attention at a hospital or doctor's office? Unknown When did it last happen?      infant allergy If all above answers are "NO", may proceed with cephalosporin use. .   Gluten Meal Diarrhea   Biaxin [clarithromycin] Tinitus   Ears ringing, loss sense of smell   Prednisone Hives   Made blood sugar high        Medication List        Accurate as of June 22, 2022  9:47 AM. If you have any questions, ask your nurse or doctor.          acetaminophen 325 MG tablet Commonly known as: TYLENOL Take 650 mg by mouth every 6 (six) hours as needed for moderate pain.   atorvastatin 20 MG tablet Commonly known as: LIPITOR Take 1 tablet (20 mg total)  by mouth daily. What changed: when to take this   BD Pen Needle Nano 2nd Gen 32G X 4 MM Misc Generic drug: Insulin Pen Needle USE TO INJECT INSULIN 4 TIMES DAILY E11.65   benazepril-hydrochlorthiazide 10-12.5 MG tablet Commonly known as: LOTENSIN HCT Take 1 tablet by mouth in the morning.   FLOMAX PO Take by mouth.   folic acid 0.5 MG tablet Commonly known as: FOLVITE Take 0.5 mg by mouth daily.   FreeStyle Libre 2 Reader Kerrin Mo USE AS DIRECTED TO TEST GLUCOSE LEVELS AT LEAST 4 TIMES DAILY E11.9   FreeStyle Libre 2 Sensor Misc USE AS DIRECTED FOR BLOOD SUGAR MONITORING. CHANGE EVERY 14 DAYS   HumaLOG KwikPen 100 UNIT/ML KwikPen Generic drug: insulin lispro Inject 10-16 Units into the skin with breakfast, with lunch, and with evening meal.   hydrocortisone 25 MG suppository Commonly known as: ANUSOL-HC Place 1 suppository (25 mg total) rectally 2 (two) times daily as needed for hemorrhoids or anal itching.   Levemir FlexTouch 100 UNIT/ML FlexPen Generic drug: insulin detemir Inject 32 Units into the skin at bedtime.   multivitamin with minerals Tabs tablet Take 1 tablet by mouth in the morning.   Vitamin D3 125 MCG (5000 UT) Tabs Take  5,000 Units by mouth in the morning.        Allergies:  Allergies  Allergen Reactions   Penicillins Other (See Comments)    As a baby, unknown reaction Did it involve swelling of the face/tongue/throat, SOB, or low BP? Unknown Did it involve sudden or severe rash/hives, skin peeling, or any reaction on the inside of your mouth or nose? Unknown Did you need to seek medical attention at a hospital or doctor's office? Unknown When did it last happen?      infant allergy If all above answers are "NO", may proceed with cephalosporin use. .    Gluten Meal Diarrhea   Biaxin [Clarithromycin] Tinitus    Ears ringing, loss sense of smell   Prednisone Hives    Made blood sugar high    Family History: Family History  Problem Relation Age of Onset   Other Mother        chronic diarrhea   COPD Mother    Heart disease Mother        died at 87   Arthritis Mother    Cancer Mother    Depression Mother    Diabetes Mother    Hyperlipidemia Mother    Hypertension Mother    Stroke Mother    Arthritis Father    Asthma Father    Birth defects Father    Heart disease Father        aortic valve replaced   Heart disease Maternal Grandmother    Stroke Maternal Grandmother    Heart disease Maternal Grandfather    Stroke Maternal Grandfather    Diabetes Paternal Grandmother    Stroke Paternal Grandfather    Heart disease Paternal Grandfather    Liver disease Neg Hx    Colon cancer Neg Hx     Social History:  reports that he has never smoked. He has never used smokeless tobacco. He reports that he does not currently use alcohol. He reports that he does not use drugs.   Physical Exam: There were no vitals taken for this visit.  Constitutional:  Alert and oriented, No acute distress. HEENT: [place] AT, moist mucus membranes.  Trachea midline, no masses. Cardiovascular: No clubbing, cyanosis, or edema. Respiratory: Normal respiratory effort, no increased  work of breathing. GI: Abdomen is  soft, nontender, nondistended, no abdominal masses GU: No CVA tenderness Skin: No rashes, bruises or suspicious lesions. Neurologic: Grossly intact, no focal deficits, moving all 4 extremities. Psychiatric: Normal mood and affect.  Laboratory Data: Lab Results  Component Value Date   WBC 2.6 (L) 05/20/2022   HGB 8.5 (L) 05/20/2022   HCT 25.8 (L) 05/20/2022   MCV 102.8 (H) 05/20/2022   PLT 40 (L) 05/20/2022    Lab Results  Component Value Date   CREATININE 1.44 (H) 05/20/2022    No results found for: "PSA"  No results found for: "TESTOSTERONE"  Lab Results  Component Value Date   HGBA1C 5.2 03/02/2022   HGBA1C 5.2 03/02/2022    Urinalysis    Component Value Date/Time   COLORURINE YELLOW 04/10/2022 1947   APPEARANCEUR CLEAR 04/10/2022 1947   APPEARANCEUR Clear 01/26/2022 1324   LABSPEC 1.006 04/10/2022 1947   PHURINE 5.0 04/10/2022 1947   GLUCOSEU NEGATIVE 04/10/2022 1947   HGBUR NEGATIVE 04/10/2022 1947   BILIRUBINUR NEGATIVE 04/10/2022 1947   BILIRUBINUR Negative 01/26/2022 [place] 04/10/2022 1947   PROTEINUR NEGATIVE 04/10/2022 1947   NITRITE NEGATIVE 04/10/2022 1947   LEUKOCYTESUR NEGATIVE 04/10/2022 1947    Lab Results  Component Value Date   LABMICR 17.0 03/02/2022   WBCUA 0-5 01/26/2022   LABEPIT >10 (A) 01/26/2022   MUCUS Present 01/26/2022   BACTERIA Few 01/26/2022    Pertinent Imaging: N/a  I reviewed Dr. Selinda Michaels notes. Dr. Blima Dessert note. Cr levels.   Assessment & Plan:    1. Prostate cancer ([place]) PSA and T were sent. I expect him to have an excellent prognosis as far as they prostate cancer. F/u 6 months.  - Urinalysis, Routine w reflex microscopic  2. CKD - renal fxn improved. He has received excellent care at [facility].    No follow-ups on file.  [name], MD  [facility]  [address] [place], [place] [address] [phone]

## 2022-06-23 LAB — PSA: Prostate Specific Ag, Serum: <0.1 ng/mL (ref 0.0–4.0)

## 2022-06-23 LAB — TESTOSTERONE: Testosterone: <3 ng/dL — ABNORMAL LOW (ref 264–916)

## 2022-06-23 NOTE — Progress Notes (Unsigned)
[place], [place] [address]   CLINIC:  Medical Oncology/Hematology  PCP:  Lindell Spar, MD [address] [place] [address] [phone]   REASON FOR VISIT:  Follow-up for cirrhosis related pancytopenia   PRIOR THERAPY: Platelet transfusion x2 (prior to prostate biopsy and prostate SpaceOAR placement)   CURRENT THERAPY: Intermittent iron infusions   INTERVAL HISTORY:  Mr. Michael Harmon 61 y.o. male returns for routine follow-up of his cirrhosis-related pancytopenia.  He was last seen in clinic by Tarri Abernethy PA-C on 04/20/2022.  At today's visit, he reports feeling somewhat poorly in light of everything going on with his health.    Since his last visit, he has continued to have issues with decompensated liver cirrhosis with ascites, bilateral leg swelling, and scrotal edema.   He is now receiving weekly paracentesis.  He continues to follow with hepatology at [facility].  He was admitted to [facility] at the end of June 2023 due to acute kidney failure and worsening liver function, which was suspected to be secondary to diuresis.  He reports that he received 2 units PRBC during hospitalization.  He continues to have significant fatigue.  No lightheadedness or syncope, but reports positional vertigo.  No recent dyspnea or chest pain.  He denies any pica, restless legs, headaches, or syncope.   He reports easy bruising and occasional petechiae of bilateral legs.  He reports that his gums occasionally bleed when he brushes his teeth and he has scant dried blood/epistaxis when he wakes in the morning.  He bleeds easily when cut.  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  He denies any hematuria, hematochezia, or melena.    He has not noticed any new lumps or bumps.  He denies any fevers, chills, or night sweats.  Baseline weight difficult to assess due to fluctuating fluid and  frequent paracentesis, but patient reports that overall he has been losing weight and muscle mass since diagnosed with cirrhosis.  He reports little to no energy, and 85% appetite.   REVIEW OF SYSTEMS:    Review of Systems  Constitutional:  Positive for fatigue. Negative for appetite change, chills, diaphoresis, fever and unexpected weight change.  HENT:   Positive for nosebleeds (Minor). Negative for lump/mass.   Eyes:  Negative for eye problems.  Respiratory:  Negative for cough, hemoptysis and shortness of breath.   Cardiovascular:  Positive for leg swelling. Negative for chest pain and palpitations.  Gastrointestinal:  Positive for diarrhea. Negative for abdominal distention, abdominal pain, blood in stool, constipation, nausea and vomiting.  Genitourinary:  Negative for hematuria.   Skin: Negative.   Neurological:  Positive for dizziness. Negative for headaches and light-headedness.  Hematological:  Bruises/bleeds easily.  Psychiatric/Behavioral:  Positive for depression and sleep disturbance. The patient is nervous/anxious.      PAST MEDICAL/SURGICAL HISTORY:  Past Medical History:  Diagnosis Date   Allergy    nose and sinus problems   Anemia    Asthma    Celiac disease    Cirrhosis, cryptogenic (HCC)    completed Hep A and B vaccines   DM (diabetes mellitus) ([place])    GERD (gastroesophageal reflux disease)    Hyperlipidemia    diet controlled now with 100 pound weight loss   Hypertension    Iron deficiency anemia due to chronic blood loss 08/13/2021   Malignant neoplasm of prostate ([place]) 10/03/2021   Murmur, heart 03/02/2022  Narrow angle glaucoma suspect of both eyes    Renal cyst 09/03/2017   right   Situational depression 07/06/2017   Splenomegaly    Thrombocytopenia ([place])    hematology, ?related to chol med (tricor) and glimeripide, just being monitored, above 100,000   Past Surgical History:  Procedure Laterality Date   BIOPSY  07/09/2016   Procedure: BIOPSY;   Surgeon: [name], MD;  [facility];  Service: Endoscopy;;  duodenal, gastric, esophaageal   BIOPSY  07/31/2021   Procedure: BIOPSY;  Surgeon: [name], MD;  [facility];  Service: Endoscopy;;   CLOSED MANIPULATION SHOULDER     COLONOSCOPY  11/2015   Dr. Posey Pronto: cecal bx neg for microscopic colitis. ascending colon polyp (adenomatous)   COLONOSCOPY WITH PROPOFOL N/A 07/31/2021   Procedure: COLONOSCOPY WITH PROPOFOL;  Surgeon: [name], MD;  [facility];  Service: Endoscopy;  Laterality: N/A;  7:30am   ESOPHAGEAL BANDING N/A 08/04/2017   Procedure: ESOPHAGEAL BANDING;  Surgeon: [name], MD;  [facility];  Service: Endoscopy;  Laterality: N/A;  esophageal varices banding   ESOPHAGEAL BANDING N/A 10/13/2017   Procedure: ESOPHAGEAL BANDING;  Surgeon: [name], MD;  [facility];  Service: Endoscopy;  Laterality: N/A;   ESOPHAGEAL BANDING N/A 04/09/2020   Procedure: ESOPHAGEAL BANDING;  Surgeon: [name], MD;  [facility];  Service: Endoscopy;  Laterality: N/A;   ESOPHAGOGASTRODUODENOSCOPY N/A 07/09/2016   Procedure: ESOPHAGOGASTRODUODENOSCOPY (EGD);  Surgeon: [name], MD;  [facility];  Service: Endoscopy;  Laterality: N/A;  730   ESOPHAGOGASTRODUODENOSCOPY N/A 08/04/2017   Procedure: ESOPHAGOGASTRODUODENOSCOPY (EGD);  Surgeon: [name], MD;  [facility];  Service: Endoscopy;  Laterality: N/A;  7:30am   ESOPHAGOGASTRODUODENOSCOPY N/A 10/13/2017   Dr. [name]: grade 2 esophageal varices status post banding, portal hypertensive gastropathy   ESOPHAGOGASTRODUODENOSCOPY  11/23/2019   [name]: Esophageal varices, 4 columns of grade 2-3 status post banding.  Varices more prominent than seen in December 2018.  Portal gastropathy.  Normal-appearing small bowel.   ESOPHAGOGASTRODUODENOSCOPY (EGD) WITH PROPOFOL N/A 04/09/2020   Dr. [name]: 4 columns of grade 2/grade 3  esophageal varices somewhat recalcitrant.  Status post esophageal band ligation today.  Portal gastropathy.  Normal-appearing small bowel.   ESOPHAGOGASTRODUODENOSCOPY (EGD) WITH PROPOFOL N/A 07/31/2021   Procedure: ESOPHAGOGASTRODUODENOSCOPY (EGD) WITH PROPOFOL;  Surgeon: [name], MD;  [facility];  Service: Endoscopy;  Laterality: N/A;   GOLD SEED IMPLANT N/A 01/15/2022   Procedure: GOLD SEED IMPLANT;  Surgeon: Cleon Gustin, MD;  Location: [facility];  Service: Urology;  Laterality: N/A;   SPACE OAR INSTILLATION N/A 01/15/2022   Procedure: SPACE OAR INSTILLATION;  Surgeon: Cleon Gustin, MD;  Location: [facility];  Service: Urology;  Laterality: N/A;   US PARACENTESIS  06/10/2022     SOCIAL HISTORY:  Social History   Socioeconomic History   Marital status: Married    Spouse name: Melissa   Number of children: 0   Years of education: 14   Highest education level: Not on file  Occupational History   Occupation: Customer service manager, [employer], travels  Tobacco Use   Smoking status: Never   Smokeless tobacco: Never  Vaping Use   Vaping Use: Never used  Substance and Sexual Activity   Alcohol use: Not Currently    Comment: rare, 1-2 drinks a month, on vacation   Drug use: No  Sexual activity: Not Currently  Other Topics Concern   Not on file  Social History Narrative   Lives at home with wife Melissa   One dog.   Has no children.   Eats all food groups.   Works for [employer] and [employer].       Social Determinants of Health   Financial Resource Strain: Not on file  Food Insecurity: Not on file  Transportation Needs: Not on file  Physical Activity: Not on file  Stress: Not on file  Social Connections: Not on file  Intimate Partner Violence: Not on file    FAMILY HISTORY:  Family History  Problem Relation Age of Onset   Other Mother        chronic diarrhea   COPD Mother    Heart disease Mother        died at 28    Arthritis Mother    Cancer Mother    Depression Mother    Diabetes Mother    Hyperlipidemia Mother    Hypertension Mother    Stroke Mother    Arthritis Father    Asthma Father    Birth defects Father    Heart disease Father        aortic valve replaced   Heart disease Maternal Grandmother    Stroke Maternal Grandmother    Heart disease Maternal Grandfather    Stroke Maternal Grandfather    Diabetes Paternal Grandmother    Stroke Paternal Grandfather    Heart disease Paternal Grandfather    Liver disease Neg Hx    Colon cancer Neg Hx     CURRENT MEDICATIONS:  Outpatient Encounter Medications as of 06/24/2022  Medication Sig Note   acetaminophen (TYLENOL) 325 MG tablet Take 650 mg by mouth every 6 (six) hours as needed for moderate pain.    atorvastatin (LIPITOR) 20 MG tablet Take 1 tablet (20 mg total) by mouth daily. (Patient taking differently: Take 20 mg by mouth in the morning.)    BD PEN NEEDLE NANO 2ND GEN 32G X 4 MM MISC USE TO INJECT INSULIN 4 TIMES DAILY E11.65    BENADRYL ITCH STOPPING 2 % GEL Apply topically.    benazepril-hydrochlorthiazide (LOTENSIN HCT) 10-12.5 MG tablet Take 1 tablet by mouth in the morning.    Cholecalciferol (VITAMIN D3) 125 MCG (5000 UT) TABS Take 5,000 Units by mouth in the morning.    ciprofloxacin (CIPRO) 500 MG tablet Take 500 mg by mouth daily.    Continuous Blood Gluc Receiver (FREESTYLE LIBRE 2 READER) DEVI USE AS DIRECTED TO TEST GLUCOSE LEVELS AT LEAST 4 TIMES DAILY E11.9    Continuous Blood Gluc Sensor (FREESTYLE LIBRE 2 SENSOR) MISC USE AS DIRECTED FOR BLOOD SUGAR MONITORING. CHANGE EVERY 14 DAYS    folic acid (FOLVITE) 0.5 MG tablet Take 0.5 mg by mouth daily.    HUMALOG KWIKPEN 100 UNIT/ML KwikPen Inject 10-16 Units into the skin with breakfast, with lunch, and with evening meal. 02/10/2022: 12 UNITS   hydrochlorothiazide (HYDRODIURIL) 12.5 MG tablet 1 tablet daily.    hydrocortisone (ANUSOL-HC) 25 MG suppository Place 1  suppository (25 mg total) rectally 2 (two) times daily as needed for hemorrhoids or anal itching.    LEVEMIR FLEXTOUCH 100 UNIT/ML Pen Inject 32 Units into the skin at bedtime.    LORazepam (ATIVAN) 0.5 MG tablet Take 1 tablet by mouth 2 (two) times daily.    Multiple Vitamin (MULTIVITAMIN WITH MINERALS) TABS tablet Take 1 tablet by mouth in the  morning.    Tamsulosin HCl (FLOMAX PO) Take by mouth.    No facility-administered encounter medications on file as of 06/24/2022.    ALLERGIES:  Allergies  Allergen Reactions   Penicillins Other (See Comments)    As a baby, unknown reaction Did it involve swelling of the face/tongue/throat, SOB, or low BP? Unknown Did it involve sudden or severe rash/hives, skin peeling, or any reaction on the inside of your mouth or nose? Unknown Did you need to seek medical attention at a hospital or doctor's office? Unknown When did it last happen?      infant allergy If all above answers are "NO", may proceed with cephalosporin use. .    Gluten Meal Diarrhea   Biaxin [Clarithromycin] Tinitus    Ears ringing, loss sense of smell   Prednisone Hives    Made blood sugar high     PHYSICAL EXAM:  ECOG PERFORMANCE STATUS: 1 - Symptomatic but completely ambulatory    There were no vitals filed for this visit. There were no vitals filed for this visit. Physical Exam Constitutional:      Appearance: Normal appearance. He is obese.  HENT:     Head: Normocephalic and atraumatic.     Mouth/Throat:     Mouth: Mucous membranes are moist.  Eyes:     Extraocular Movements: Extraocular movements intact.     Pupils: Pupils are equal, round, and reactive to light.  Cardiovascular:     Rate and Rhythm: Normal rate and regular rhythm.     Pulses: Normal pulses.     Heart sounds: Murmur heard.  Pulmonary:     Effort: Pulmonary effort is normal.     Breath sounds: Normal breath sounds.  Abdominal:     General: Bowel sounds are normal. There is distension.      Palpations: Abdomen is soft.     Tenderness: There is no abdominal tenderness.  Musculoskeletal:        General: No swelling.     Right lower leg: Edema (2+) present.     Left lower leg: Edema (2+) present.  Lymphadenopathy:     Cervical: No cervical adenopathy.  Skin:    General: Skin is warm and dry.     Comments: Spider angiomas and telangiectasias on face and neck  Neurological:     General: No focal deficit present.     Mental Status: He is alert and oriented to person, place, and time.  Psychiatric:        Mood and Affect: Mood normal.        Behavior: Behavior normal.     LABORATORY DATA:  I have reviewed the labs as listed.  CBC    Component Value Date/Time   WBC 2.6 (L) 05/20/2022 1034   RBC 2.51 (L) 05/20/2022 1034   HGB 8.5 (L) 05/20/2022 1034   HGB 11.1 (L) 09/02/2021 1655   HCT 25.8 (L) 05/20/2022 1034   HCT 32.9 (L) 09/02/2021 1655   PLT 40 (L) 05/20/2022 1034   PLT 43 (LL) 09/02/2021 1655   MCV 102.8 (H) 05/20/2022 1034   MCV 96 09/02/2021 1655   MCH 33.9 05/20/2022 1034   MCHC 32.9 05/20/2022 1034   RDW 14.7 05/20/2022 1034   RDW 14.0 09/02/2021 1655   LYMPHSABS 0.4 (L) 05/20/2022 1034   LYMPHSABS 1.2 09/02/2021 1655   MONOABS 0.3 05/20/2022 1034   EOSABS 0.2 05/20/2022 1034   EOSABS 0.3 09/02/2021 1655   BASOSABS 0.0 05/20/2022 1034   BASOSABS 0.0  09/02/2021 1655      Latest Ref Rng & Units 05/20/2022   10:34 AM 04/13/2022   10:33 AM 04/10/2022    8:06 PM  CMP  Glucose 70 - 99 mg/dL 116  151  131   BUN 8 - 23 mg/dL 43  39  32   Creatinine 0.61 - 1.24 mg/dL 1.44  1.93  1.65   Sodium 135 - 145 mmol/L 136  135  135   Potassium 3.5 - 5.1 mmol/L 4.6  4.5  4.1   Chloride 98 - 111 mmol/L 111  112  110   CO2 22 - 32 mmol/L 20  19  19    Calcium 8.9 - 10.3 mg/dL 8.8  7.9  8.4   Total Protein 6.5 - 8.1 g/dL 6.5  5.9  6.2   Total Bilirubin 0.3 - 1.2 mg/dL 1.6  1.3  1.5   Alkaline Phos 38 - 126 U/L 167  159  161   AST 15 - 41 U/L 75  49  57   ALT 0 -  44 U/L 48  34  38     DIAGNOSTIC IMAGING:  I have independently reviewed the relevant imaging and discussed with the patient.  ASSESSMENT & PLAN: 1.  Thrombocytopenia and leukopenia, secondary to cirrhosis and splenomegaly - Platelet count has slowly down trended in the past 5 years.  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  However, no abdominal work-up was done at the time, but given the fact the patient was diagnosed with cirrhosis in 2017, he likely had some element of liver disease and splenomegaly in 2015, which would explain his thrombocytopenia and cast some suspicion as to whether or not he ever had any ITP. - Abdominal ultrasound (09/17/2021): Cirrhotic liver and splenomegaly (15.4 cm in length) - CT scan (11/14/2021 at [facility] showed enlarged spleen measuring 17 cm - Work-up negative for other causes of thrombocytopenia - normal B12, methylmalonic acid, folate, copper; negative rheumatoid factor/ANA; no abnormal platelets noted on pathology smear review - Received platelet transfusion x1 on 08/18/2021 prior to prostate biopsy.  Blood transfusion x1 on 01/15/2022 prior to prostate SpaceOAR placement - Bone marrow biopsy (02/10/2022) shows hypocellular marrow for age with trilineage lineage hematopoiesis.  No increase in blasts.  No evidence of metastatic carcinoma. - Admits to easy bruising, gum-bleeding, scant epistaxis, and bleeding scrotal angiokeratomas (following with urology).  He describes possible petechial rash that occurs intermittently in his bilateral lower extremities.   - He has not had any major bleeding events such as bright red blood per rectum or melena.  - No B symptoms or frequent infections  - Most recent CBC (06/24/2022): Platelets 40, essentially at baseline. -  PLAN: Differential diagnosis favors thrombocytopenia and leukopenia from cirrhosis and splenomegaly, unable to rule out ITP at this time.  Cytopenias will be further complicated by prostate radiation.     - No indication for treatment at this time.  We will continue to monitor closely with repeat CBC and RTC in 12 weeks.     - Due to questionable history of ITP, could also consider trial of steroids before pursuing splenic artery embolization.  If platelets count is between 30,000-40,000, would consider trial of pulsed dexamethasone 40 mg x 4 days to see if he gets good response.  (He had  a problem with prednisone in the past with elevated blood sugars)  - No indication for treatment of thrombocytopenia at this time, but he is approaching the level at which he would benefit from splenic artery embolization. Prior to any splenic intervention, though, would discuss with [facility] hepatologist     2.  Normocytic anemia with iron deficiency - Patient denies any major bleeding events, but EGD/colonoscopy on 07/31/2021 showed moderately friable portal colopathy as well as gastric erosions and grade 2 and grade 3 varices - EGD (12/26/2021 via [facility]): Grade 2 esophageal varices, banded x3; old blood noted in stomach; mild portal hypertensive gastropathy - Other work-up (08/18/2021) showed normal reticulocytes; normal B12, methylmalonic acid, folate, homocysteine, and copper.  Immunofixation and SPEP negative. - Patient reports that he has been diagnosed with celiac disease by Dr. [name] - He received IV Venofer x1000 mg, last dose given 09/03/2021  - He is symptomatic with fatigue.     - He denies any obvious GI bleeding such as melena or bright red blood per rectum   - He had worsening anemia noted in June 2023 with Hgb 7.9, thought to be possibly worsened due to his recent radiotherapy of prostate. - He takes folic acid due to elevated homocystine. 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  - PLAN: No indication for IV iron at this time.     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  (Would consider that hyperhomocystinemia may be secondary to his liver cirrhosis)   - Monthly CBC x3.  Repeat lab panel with RTC in 3 months.  3. Weight loss   - Baseline weight difficult to assess due to fluctuating fluid and frequent paracentesis, but patient reports that overall he has been losing weight and muscle mass since diagnosed with cirrhosis. - Weight today is 206 pounds, within baseline 20 pound range over the past 6 months  4.  Cirrhosis with esophageal varices - Diagnosed in 2017 - Patient had EGD/colonoscopy on 07/31/2021 which showed portal colopathy as well as gastric erosions and grade 3 varices, which were not banded due to low platelet count - EGD (12/26/2021 via [facility]): Grade 2 esophageal varices, banded x3; old blood noted in stomach; mild portal hypertensive gastropathy - PLAN: Continue follow-up with hepatologist at [facility].   - Patient informed that he would likely need transfusion of platelets x1 unit if he has platelets of less than 50 at the time of the future EGD/banding procedures.   5.  Stage T1c adenocarcinoma of the prostate, Gleason 3+4   - Diagnosed in October 2022 with prostate biopsy after abnormal PSA (8.2) - Following with urologist (Dr. Junious Silk / Dr. Alyson Ingles) and has also been seen by radiation oncology (Ashlyn Bruning PA-C / Dr. Tyler Pita) - He had SpaceOAR placement on 01/15/2022 - Patient is undergoing XRT in [place], which was completed on 03/13/2022. - He is receiving Lupron shots - PLAN: Patient to continue follow-up and treatment as per  urology and radiation oncology.  He will contact us prior to any procedures in order to receive platelets if needed.   PLAN SUMMARY & DISPOSITION:   CBC today, repeat CBC in 1 month with blood bank sample Same-day  labs and RTC in 12 weeks (CBC, CMP, LDH, ferritin, iron/TIBC)   All questions were answered. The patient knows to call the clinic with any problems, questions or concerns.  Medical decision making: Moderate      Time spent on visit: I spent  20  minutes counseling the patient face to face. The total time spent in the appointment was  30  minutes and more than 50% was on counseling.   Harriett Rush, PA-C   06/24/22 11:51 PM

## 2022-06-24 ENCOUNTER — Other Ambulatory Visit: Payer: Self-pay | Admitting: *Deleted

## 2022-06-24 ENCOUNTER — Inpatient Hospital Stay: Payer: BC Managed Care – PPO | Attending: Physician Assistant | Admitting: Physician Assistant

## 2022-06-24 ENCOUNTER — Inpatient Hospital Stay: Payer: BC Managed Care – PPO

## 2022-06-24 VITALS — BP 116/49 | HR 63 | Temp 97.7°F | Resp 18 | Ht 67.0 in | Wt 206.5 lb

## 2022-06-24 DIAGNOSIS — D649 Anemia, unspecified: Secondary | ICD-10-CM

## 2022-06-24 DIAGNOSIS — Z923 Personal history of irradiation: Secondary | ICD-10-CM | POA: Diagnosis not present

## 2022-06-24 DIAGNOSIS — D61818 Other pancytopenia: Secondary | ICD-10-CM | POA: Insufficient documentation

## 2022-06-24 DIAGNOSIS — D72819 Decreased white blood cell count, unspecified: Secondary | ICD-10-CM | POA: Insufficient documentation

## 2022-06-24 DIAGNOSIS — D509 Iron deficiency anemia, unspecified: Secondary | ICD-10-CM | POA: Insufficient documentation

## 2022-06-24 DIAGNOSIS — Z809 Family history of malignant neoplasm, unspecified: Secondary | ICD-10-CM | POA: Insufficient documentation

## 2022-06-24 DIAGNOSIS — D696 Thrombocytopenia, unspecified: Secondary | ICD-10-CM | POA: Insufficient documentation

## 2022-06-24 DIAGNOSIS — E7211 Homocystinuria: Secondary | ICD-10-CM

## 2022-06-24 DIAGNOSIS — K7460 Unspecified cirrhosis of liver: Secondary | ICD-10-CM | POA: Insufficient documentation

## 2022-06-24 DIAGNOSIS — R161 Splenomegaly, not elsewhere classified: Secondary | ICD-10-CM | POA: Insufficient documentation

## 2022-06-24 DIAGNOSIS — D500 Iron deficiency anemia secondary to blood loss (chronic): Secondary | ICD-10-CM

## 2022-06-24 DIAGNOSIS — C61 Malignant neoplasm of prostate: Secondary | ICD-10-CM | POA: Insufficient documentation

## 2022-06-24 DIAGNOSIS — I8510 Secondary esophageal varices without bleeding: Secondary | ICD-10-CM | POA: Diagnosis not present

## 2022-06-24 DIAGNOSIS — E119 Type 2 diabetes mellitus without complications: Secondary | ICD-10-CM | POA: Insufficient documentation

## 2022-06-24 DIAGNOSIS — R634 Abnormal weight loss: Secondary | ICD-10-CM | POA: Insufficient documentation

## 2022-06-24 DIAGNOSIS — K766 Portal hypertension: Secondary | ICD-10-CM | POA: Insufficient documentation

## 2022-06-24 DIAGNOSIS — D708 Other neutropenia: Secondary | ICD-10-CM

## 2022-06-24 DIAGNOSIS — I10 Essential (primary) hypertension: Secondary | ICD-10-CM | POA: Insufficient documentation

## 2022-06-24 LAB — CBC WITH DIFFERENTIAL/PLATELET
Abs Immature Granulocytes: 0 10*3/uL (ref 0.00–0.07)
Basophils Absolute: 0 10*3/uL (ref 0.0–0.1)
Basophils Relative: 1 %
Eosinophils Absolute: 1.1 10*3/uL — ABNORMAL HIGH (ref 0.0–0.5)
Eosinophils Relative: 34 %
HCT: 26.7 % — ABNORMAL LOW (ref 39.0–52.0)
Hemoglobin: 9 g/dL — ABNORMAL LOW (ref 13.0–17.0)
Immature Granulocytes: 0 %
Lymphocytes Relative: 18 %
Lymphs Abs: 0.6 10*3/uL — ABNORMAL LOW (ref 0.7–4.0)
MCH: 34 pg (ref 26.0–34.0)
MCHC: 33.7 g/dL (ref 30.0–36.0)
MCV: 100.8 fL — ABNORMAL HIGH (ref 80.0–100.0)
Monocytes Absolute: 0.4 10*3/uL (ref 0.1–1.0)
Monocytes Relative: 11 %
Neutro Abs: 1.1 10*3/uL — ABNORMAL LOW (ref 1.7–7.7)
Neutrophils Relative %: 36 %
Platelets: 40 10*3/uL — ABNORMAL LOW (ref 150–400)
RBC: 2.65 MIL/uL — ABNORMAL LOW (ref 4.22–5.81)
RDW: 15.6 % — ABNORMAL HIGH (ref 11.5–15.5)
WBC: 3.2 10*3/uL — ABNORMAL LOW (ref 4.0–10.5)
nRBC: 0 % (ref 0.0–0.2)

## 2022-06-24 LAB — SAMPLE TO BLOOD BANK

## 2022-06-24 LAB — IRON AND TIBC
Iron: 54 ug/dL (ref 45–182)
Saturation Ratios: 22 % (ref 17.9–39.5)
TIBC: 244 ug/dL — ABNORMAL LOW (ref 250–450)
UIBC: 190 ug/dL

## 2022-06-24 LAB — FERRITIN: Ferritin: 234 ng/mL (ref 24–336)

## 2022-06-24 LAB — LACTATE DEHYDROGENASE: LDH: 201 U/L — ABNORMAL HIGH (ref 98–192)

## 2022-06-24 NOTE — Patient Instructions (Signed)
[place] at [facility] Discharge Instructions  You were seen today by Tarri Abernethy PA-C for your anemia and low platelets.  ANEMIA: Your blood count has improved slightly since your last visit (hemoglobin 9.0 today).  We will continue to check your blood once a month to make sure you do not have any sudden drops or needed any transfusions.  LOW PLATELETS: Your platelets remain low secondary to your cirrhosis and enlarged spleen.  Platelets today were 40.  This places you at increased risk of bleeding and bruising but does not require any specific treatment at this time.  Continue to follow closely with your liver doctor for your liver cirrhosis.  Seek IMMEDIATE medical attention if you have any bloody vomit, black tarry bowel movements, or bright red blood in the toilet, or any other signs of uncontrolled bleeding.  FOLLOW-UP APPOINTMENT: Labs in 3 months with office visit the week after  ** Thank you for trusting me with your healthcare!  I strive to provide all of my patients with quality care at each visit.  If you receive a survey for this visit, I would be so grateful to you for taking the time to provide feedback.  Thank you in advance!  ~ Wilmar Prabhakar                   Dr. Derek Jack   &   Tarri Abernethy, PA-C   - - - - - - - - - - - - - - - - - -     Thank you for choosing [place] at [facility] to provide your oncology and hematology care.  To afford each patient quality time with our provider, please arrive at least 15 minutes before your scheduled appointment time.   If you have a lab appointment with the [facility] please come in thru the Main Entrance and check in at the main information desk.  You need to re-schedule your appointment should you arrive 10 or more minutes late.  We strive to give you quality time with our providers, and arriving late affects you and other patients whose appointments are after  yours.  Also, if you no show three or more times for appointments you may be dismissed from the clinic at the providers discretion.     Again, thank you for choosing [facility].  Our hope is that these requests will decrease the amount of time that you wait before being seen by our physicians.       _____________________________________________________________  Should you have questions after your visit to [facility], please contact our office at [phone] and follow the prompts.  Our office hours are 8:00 a.m. and 4:30 p.m. Monday - Friday.  Please note that voicemails left after 4:00 p.m. may not be returned until the following business day.  We are closed weekends and major holidays.  You do have access to a nurse 24-7, just call the main number to the clinic [phone] and do not press any options, hold on the line and a nurse will answer the phone.    For prescription refill requests, have your pharmacy contact our office and allow 72 hours.    Due to Covid, you will need to wear a mask upon entering the hospital. If you do not have a mask, a mask will be given to you at the Main Entrance upon arrival. For doctor visits, patients may have 1  support person age 22 or older with them. For treatment visits, patients can not have anyone with them due to social distancing guidelines and our immunocompromised population.

## 2022-06-25 ENCOUNTER — Other Ambulatory Visit (HOSPITAL_COMMUNITY): Payer: BC Managed Care – PPO

## 2022-06-25 ENCOUNTER — Ambulatory Visit (HOSPITAL_COMMUNITY)
Admission: RE | Admit: 2022-06-25 | Discharge: 2022-06-25 | Disposition: A | Discharge status: Home / Self Care | Payer: BC Managed Care – PPO | Source: Ambulatory Visit | Attending: Gastroenterology | Admitting: Gastroenterology

## 2022-06-25 ENCOUNTER — Encounter (HOSPITAL_COMMUNITY): Payer: Self-pay

## 2022-06-25 DIAGNOSIS — K7469 Other cirrhosis of liver: Secondary | ICD-10-CM | POA: Diagnosis not present

## 2022-06-25 LAB — BODY FLUID CELL COUNT WITH DIFFERENTIAL
Eos, Fluid: 4 %
Lymphs, Fluid: 51 %
Monocyte-Macrophage-Serous Fluid: 44 % — ABNORMAL LOW (ref 50–90)
Neutrophil Count, Fluid: 1 % (ref 0–25)
Total Nucleated Cell Count, Fluid: 235 cu mm (ref 0–1000)

## 2022-06-25 MED ORDER — ALBUMIN HUMAN 25 % IV SOLN
INTRAVENOUS | Status: AC
  Administered 2022-06-25: 50 g via INTRAVENOUS
  Filled 2022-06-25: qty 200

## 2022-06-25 MED ORDER — SODIUM CHLORIDE FLUSH 0.9 % IV SOLN
INTRAVENOUS | Status: AC
  Administered 2022-06-25: 10 mL via INTRAVENOUS
  Filled 2022-06-25: qty 10

## 2022-06-25 MED ORDER — ALBUMIN HUMAN 25 % IV SOLN: 50.0000 g | Freq: Once | INTRAVENOUS | Status: AC

## 2022-06-25 NOTE — Progress Notes (Signed)
Patient tolerated left sided paracentesis procedure and 50G of IV albumin well today. 5 Liters of clear yellow ascites removed and labs collected and sent to lab for processing. PT verbalized understanding of discharge instructions and ambulatory at departure with no acute distress noted.

## 2022-06-25 NOTE — Procedures (Signed)
   US guided LLQ paracentesis  5 Liters dark yellow fluid obtained Sent for labs per ordering MD  Tolerated well EBL: None

## 2022-06-26 ENCOUNTER — Other Ambulatory Visit (HOSPITAL_COMMUNITY): Payer: Self-pay | Admitting: Gastroenterology

## 2022-06-26 ENCOUNTER — Other Ambulatory Visit: Payer: Self-pay | Admitting: Gastroenterology

## 2022-06-26 DIAGNOSIS — R188 Other ascites: Secondary | ICD-10-CM

## 2022-06-26 DIAGNOSIS — K7469 Other cirrhosis of liver: Secondary | ICD-10-CM

## 2022-06-26 LAB — PATHOLOGIST SMEAR REVIEW

## 2022-07-02 ENCOUNTER — Encounter (HOSPITAL_COMMUNITY): Payer: Self-pay

## 2022-07-02 ENCOUNTER — Ambulatory Visit (HOSPITAL_COMMUNITY)
Admission: RE | Admit: 2022-07-02 | Discharge: 2022-07-02 | Disposition: A | Discharge status: Home / Self Care | Payer: BC Managed Care – PPO | Source: Ambulatory Visit | Attending: Gastroenterology | Admitting: Gastroenterology

## 2022-07-02 DIAGNOSIS — K7469 Other cirrhosis of liver: Secondary | ICD-10-CM

## 2022-07-02 DIAGNOSIS — R188 Other ascites: Secondary | ICD-10-CM | POA: Diagnosis present

## 2022-07-02 LAB — BODY FLUID CELL COUNT WITH DIFFERENTIAL
Eos, Fluid: 2 %
Lymphs, Fluid: 28 %
Monocyte-Macrophage-Serous Fluid: 69 % (ref 50–90)
Neutrophil Count, Fluid: 1 % (ref 0–25)
Total Nucleated Cell Count, Fluid: 228 cu mm (ref 0–1000)

## 2022-07-02 MED ORDER — ALBUMIN HUMAN 25 % IV SOLN
INTRAVENOUS | Status: AC
  Administered 2022-07-02: 50 g via INTRAVENOUS
  Filled 2022-07-02: qty 200

## 2022-07-02 MED ORDER — ALBUMIN HUMAN 25 % IV SOLN: 50.0000 g | Freq: Once | INTRAVENOUS | Status: AC

## 2022-07-02 NOTE — Procedures (Signed)
   US guided RLQ paracentesis  5 liters max per ordering MD Yellow fluid Tolerated well Labs sent per MD  EBL: None

## 2022-07-02 NOTE — Progress Notes (Signed)
Paracentesis complete no signs of distress.

## 2022-07-06 ENCOUNTER — Emergency Department (HOSPITAL_COMMUNITY): Payer: BC Managed Care – PPO

## 2022-07-06 ENCOUNTER — Other Ambulatory Visit: Payer: Self-pay

## 2022-07-06 ENCOUNTER — Encounter (HOSPITAL_COMMUNITY): Payer: Self-pay

## 2022-07-06 ENCOUNTER — Emergency Department (HOSPITAL_COMMUNITY)
Admission: EM | Admit: 2022-07-06 | Discharge: 2022-07-06 | Disposition: A | Discharge status: Home / Self Care | Payer: BC Managed Care – PPO | Attending: Emergency Medicine | Admitting: Emergency Medicine

## 2022-07-06 DIAGNOSIS — S299XXA Unspecified injury of thorax, initial encounter: Secondary | ICD-10-CM | POA: Diagnosis present

## 2022-07-06 DIAGNOSIS — Z794 Long term (current) use of insulin: Secondary | ICD-10-CM | POA: Diagnosis not present

## 2022-07-06 DIAGNOSIS — S2232XA Fracture of one rib, left side, initial encounter for closed fracture: Secondary | ICD-10-CM | POA: Insufficient documentation

## 2022-07-06 DIAGNOSIS — M25512 Pain in left shoulder: Secondary | ICD-10-CM | POA: Diagnosis not present

## 2022-07-06 DIAGNOSIS — S50312A Abrasion of left elbow, initial encounter: Secondary | ICD-10-CM

## 2022-07-06 DIAGNOSIS — Z23 Encounter for immunization: Secondary | ICD-10-CM | POA: Insufficient documentation

## 2022-07-06 DIAGNOSIS — R109 Unspecified abdominal pain: Secondary | ICD-10-CM | POA: Insufficient documentation

## 2022-07-06 DIAGNOSIS — S51012A Laceration without foreign body of left elbow, initial encounter: Secondary | ICD-10-CM | POA: Insufficient documentation

## 2022-07-06 DIAGNOSIS — Y9389 Activity, other specified: Secondary | ICD-10-CM | POA: Insufficient documentation

## 2022-07-06 DIAGNOSIS — S42122A Displaced fracture of acromial process, left shoulder, initial encounter for closed fracture: Secondary | ICD-10-CM | POA: Diagnosis not present

## 2022-07-06 DIAGNOSIS — Y92812 Truck as the place of occurrence of the external cause: Secondary | ICD-10-CM | POA: Insufficient documentation

## 2022-07-06 DIAGNOSIS — S0990XA Unspecified injury of head, initial encounter: Secondary | ICD-10-CM | POA: Insufficient documentation

## 2022-07-06 DIAGNOSIS — W1789XA Other fall from one level to another, initial encounter: Secondary | ICD-10-CM | POA: Insufficient documentation

## 2022-07-06 DIAGNOSIS — S80212A Abrasion, left knee, initial encounter: Secondary | ICD-10-CM | POA: Diagnosis not present

## 2022-07-06 DIAGNOSIS — W19XXXA Unspecified fall, initial encounter: Secondary | ICD-10-CM

## 2022-07-06 LAB — CBC WITH DIFFERENTIAL/PLATELET
Abs Immature Granulocytes: 0.01 10*3/uL (ref 0.00–0.07)
Basophils Absolute: 0 10*3/uL (ref 0.0–0.1)
Basophils Relative: 0 %
Eosinophils Absolute: 0.5 10*3/uL (ref 0.0–0.5)
Eosinophils Relative: 15 %
HCT: 25.9 % — ABNORMAL LOW (ref 39.0–52.0)
Hemoglobin: 8.7 g/dL — ABNORMAL LOW (ref 13.0–17.0)
Immature Granulocytes: 0 %
Lymphocytes Relative: 14 %
Lymphs Abs: 0.5 10*3/uL — ABNORMAL LOW (ref 0.7–4.0)
MCH: 34.1 pg — ABNORMAL HIGH (ref 26.0–34.0)
MCHC: 33.6 g/dL (ref 30.0–36.0)
MCV: 101.6 fL — ABNORMAL HIGH (ref 80.0–100.0)
Monocytes Absolute: 0.4 10*3/uL (ref 0.1–1.0)
Monocytes Relative: 11 %
Neutro Abs: 2 10*3/uL (ref 1.7–7.7)
Neutrophils Relative %: 60 %
Platelets: 36 10*3/uL — ABNORMAL LOW (ref 150–400)
RBC: 2.55 MIL/uL — ABNORMAL LOW (ref 4.22–5.81)
RDW: 15.2 % (ref 11.5–15.5)
WBC: 3.4 10*3/uL — ABNORMAL LOW (ref 4.0–10.5)
nRBC: 0 % (ref 0.0–0.2)

## 2022-07-06 LAB — BASIC METABOLIC PANEL
Anion gap: 5 (ref 5–15)
BUN: 53 mg/dL — ABNORMAL HIGH (ref 8–23)
CO2: 20 mmol/L — ABNORMAL LOW (ref 22–32)
Calcium: 8.2 mg/dL — ABNORMAL LOW (ref 8.9–10.3)
Chloride: 112 mmol/L — ABNORMAL HIGH (ref 98–111)
Creatinine, Ser: 1.64 mg/dL — ABNORMAL HIGH (ref 0.61–1.24)
GFR, Estimated: 47 mL/min — ABNORMAL LOW (ref >60)
Glucose, Bld: 153 mg/dL — ABNORMAL HIGH (ref 70–99)
Potassium: 4.3 mmol/L (ref 3.5–5.1)
Sodium: 137 mmol/L (ref 135–145)

## 2022-07-06 LAB — PROTIME-INR
INR: 1.3 — ABNORMAL HIGH (ref 0.8–1.2)
Prothrombin Time: 16 seconds — ABNORMAL HIGH (ref 11.4–15.2)

## 2022-07-06 MED ORDER — IOHEXOL 300 MG/ML  SOLN
100.0000 mL | Freq: Once | INTRAMUSCULAR | Status: AC | PRN
  Administered 2022-07-06: 100 mL via INTRAVENOUS

## 2022-07-06 MED ORDER — OXYCODONE HCL 5 MG PO TABS: 5.0000 mg | ORAL_TABLET | Freq: Four times a day (QID) | ORAL | 0 refills | Status: DC | PRN

## 2022-07-06 MED ORDER — OXYCODONE HCL 5 MG PO TABS
5.0000 mg | ORAL_TABLET | Freq: Once | ORAL | Status: AC
  Administered 2022-07-06: 5 mg via ORAL
  Filled 2022-07-06: qty 1

## 2022-07-06 MED ORDER — TETANUS-DIPHTH-ACELL PERTUSSIS 5-2.5-18.5 LF-MCG/0.5 IM SUSY
0.5000 mL | PREFILLED_SYRINGE | Freq: Once | INTRAMUSCULAR | Status: AC
  Administered 2022-07-06: 0.5 mL via INTRAMUSCULAR
  Filled 2022-07-06: qty 0.5

## 2022-07-06 NOTE — ED Notes (Signed)
Patient transported to CT 

## 2022-07-06 NOTE — ED Triage Notes (Addendum)
Pt to er, pt states that he is here because he was moving some stuff in the back bed of a pickup truck states that he fell out the back of the truck and fell on his L side, pt c/o L shoulder pain with movement and back pain with movement.  Pt states that he feels like something is moving.  Pt denies hitting his head or passing out.

## 2022-07-06 NOTE — ED Notes (Signed)
Pt left elbow cleaned and dressed with gauze. Skin tear noted. Bleeding controlled.

## 2022-07-06 NOTE — Discharge Instructions (Signed)
You were seen in the emergency department for evaluation of injuries from a fall.  You had a CAT scan of your head and neck that did not show any obvious findings.  The CAT scan of your chest and abdomen showed a rib fracture on the left.  There was also a possible shoulder fracture.  You can use a sling to your left arm.  Ice to the affected area.  We are putting you on a short course of some pain medicine.  Please follow-up with your primary care doctor and your orthopedic specialist.  Return to the emergency department if any worsening or concerning symptoms.

## 2022-07-06 NOTE — ED Provider Notes (Signed)
[facility] EMERGENCY DEPARTMENT Provider Note   CSN: [id number] Arrival date & time: 07/06/22  1716     History  Chief Complaint  Patient presents with   Michael Harmon is a 61 y.o. male.  He has a history of [place] and thrombocytopenia.  He said he was working in a truck bed when he fell about 3 foot to the concrete ground.  Struck his left shoulder and chest.  Complaining of pain left shoulder left chest wall.  Worse with movement cough and deep breathing.  Denies any head or neck injury.  Some abrasions to his left knee.  He thinks his last tetanus shot was over 10 years.  No loss of consciousness.  No other injuries or complaints.  The history is provided by the patient.  Fall This is a new problem. The current episode started 3 to 5 hours ago. The problem has not changed since onset.Associated symptoms include chest pain. Pertinent negatives include no abdominal pain, no headaches and no shortness of breath. The symptoms are aggravated by twisting and bending. Nothing relieves the symptoms. He has tried nothing for the symptoms. The treatment provided no relief.       Home Medications Prior to Admission medications   Medication Sig Start Date End Date Taking? Authorizing Provider  acetaminophen (TYLENOL) 325 MG tablet Take 650 mg by mouth every 6 (six) hours as needed for moderate pain.    [provider]  atorvastatin (LIPITOR) 20 MG tablet Take 1 tablet (20 mg total) by mouth daily. Patient taking differently: Take 20 mg by mouth in the morning. 07/21/18   Caren Macadam, MD  BD PEN NEEDLE NANO 2ND GEN 32G X 4 MM MISC USE TO INJECT INSULIN 4 TIMES DAILY E11.65 08/06/21   [provider]  BENADRYL ITCH STOPPING 2 % GEL Apply topically. 06/11/22   [provider]  benazepril-hydrochlorthiazide (LOTENSIN HCT) 10-12.5 MG tablet Take 1 tablet by mouth in the morning.    [provider]  Cholecalciferol (VITAMIN D3) 125 MCG (5000 UT) TABS Take  5,000 Units by mouth in the morning.    [provider]  ciprofloxacin (CIPRO) 500 MG tablet Take 500 mg by mouth daily. 06/16/22   [provider]  Continuous Blood Gluc Receiver (FREESTYLE LIBRE 2 READER) DEVI USE AS DIRECTED TO TEST GLUCOSE LEVELS AT LEAST 4 TIMES DAILY E11.9 09/29/21   [provider]  Continuous Blood Gluc Sensor (FREESTYLE LIBRE 2 SENSOR) MISC USE AS DIRECTED FOR BLOOD SUGAR MONITORING. CHANGE EVERY 14 DAYS 08/10/21   [provider]  folic acid (FOLVITE) 0.5 MG tablet Take 0.5 mg by mouth daily.    [provider]  Glucagon (GVOKE HYPOPEN 2-PACK) 1 MG/0.2ML SOAJ INJECT 1 MG AS NEEDED BY SUBCUTANEOUS ROUTE.    [provider]  HUMALOG KWIKPEN 100 UNIT/ML KwikPen Inject 10-16 Units into the skin with breakfast, with lunch, and with evening meal. 06/10/21   [provider]  hydrochlorothiazide (HYDRODIURIL) 12.5 MG tablet 1 tablet daily.    [provider]  hydrocortisone (ANUSOL-HC) 25 MG suppository Place 1 suppository (25 mg total) rectally 2 (two) times daily as needed for hemorrhoids or anal itching. 03/02/22   Patel, Colin Broach, MD  LEVEMIR FLEXTOUCH 100 UNIT/ML Pen Inject 32 Units into the skin at bedtime. 12/25/18   [provider]  LORazepam (ATIVAN) 0.5 MG tablet Take 1 tablet by mouth 2 (two) times daily.    [provider]  meloxicam (MOBIC) 15 MG tablet     [provider]  metformin (FORTAMET) 1000 MG (OSM) 24 hr tablet     [provider]  Multiple Vitamin (MULTIVITAMIN WITH MINERALS) TABS tablet Take 1 tablet by mouth in the morning.    [provider]  nateglinide (STARLIX) 60 MG tablet Take 1 tablet by mouth 3 (three) times daily. 05/21/22   [provider]  Tamsulosin HCl (FLOMAX PO) Take by mouth.    [provider]      Allergies    Penicillins, Gluten meal, Biaxin [clarithromycin], and Prednisone    Review of Systems   Review of  Systems  Constitutional:  Negative for fever.  HENT:  Negative for sore throat.   Respiratory:  Negative for shortness of breath.   Cardiovascular:  Positive for chest pain.  Gastrointestinal:  Positive for abdominal distention. Negative for abdominal pain.  Genitourinary:  Negative for dysuria.  Musculoskeletal:  Positive for back pain (upper). Negative for neck pain.  Skin:  Positive for wound. Negative for rash.  Neurological:  Negative for headaches.    Physical Exam Updated Vital Signs BP (!) 145/71 (BP Location: Right Arm)   Pulse 75   Temp 97.9 F (36.6 C) (Oral)   Resp 17   Ht 5' 7"  (1.702 m)   Wt 90.7 kg   SpO2 97%   BMI 31.32 kg/m  Physical Exam Vitals and nursing note reviewed.  Constitutional:      General: He is not in acute distress.    Appearance: Normal appearance. He is well-developed.  HENT:     Head: Normocephalic and atraumatic.  Eyes:     Conjunctiva/sclera: Conjunctivae normal.  Cardiovascular:     Rate and Rhythm: Normal rate and regular rhythm.     Heart sounds: No murmur heard. Pulmonary:     Effort: Pulmonary effort is normal. No respiratory distress.     Breath sounds: Normal breath sounds.  Chest:    Abdominal:     Palpations: Abdomen is soft.     Tenderness: There is no abdominal tenderness.  Musculoskeletal:        General: Tenderness present. Normal range of motion.     Cervical back: Neck supple.     Right lower leg: Edema present.     Left lower leg: Edema present.     Comments: He has some tenderness with movement of his left shoulder although normal landmarks full internal/external rotation.  He has skin tears and abrasion over his left elbow.  Also has some abrasions left knee.  Full range of motion without any limitations.  He has bilateral 2+ edema lower extremities  Skin:    General: Skin is warm and dry.     Capillary Refill: Capillary refill takes less than 2 seconds.  Neurological:     Mental Status: He is alert.   Psychiatric:        Mood and Affect: Mood normal.     ED Results / Procedures / Treatments   Labs (all labs ordered are listed, but only abnormal results are displayed) Labs Reviewed  BASIC METABOLIC PANEL - Abnormal; Notable for the following components:      Result Value   Chloride 112 (*)    CO2 20 (*)    Glucose, Bld 153 (*)    BUN 53 (*)    Creatinine, Ser 1.64 (*)    Calcium 8.2 (*)    GFR, Estimated 47 (*)    All other components within  normal limits  CBC WITH DIFFERENTIAL/PLATELET - Abnormal; Notable for the following components:   WBC 3.4 (*)    RBC 2.55 (*)    Hemoglobin 8.7 (*)    HCT 25.9 (*)    MCV 101.6 (*)    MCH 34.1 (*)    Platelets 36 (*)    Lymphs Abs 0.5 (*)    All other components within normal limits  PROTIME-INR - Abnormal; Notable for the following components:   Prothrombin Time 16.0 (*)    INR 1.3 (*)    All other components within normal limits    EKG None  Radiology CT Head Wo Contrast  Result Date: 07/06/2022 CLINICAL DATA:  Polytrauma, blunt EXAM: CT HEAD WITHOUT CONTRAST TECHNIQUE: Contiguous axial images were obtained from the base of the skull through the vertex without intravenous contrast. RADIATION DOSE REDUCTION: This exam was performed according to the departmental dose-optimization program which includes automated exposure control, adjustment of the mA and/or kV according to patient size and/or use of iterative reconstruction technique. COMPARISON:  06/07/2020 FINDINGS: Brain: No intracranial hemorrhage, mass effect, or midline shift. No hydrocephalus. The basilar cisterns are patent. No evidence of territorial infarct or acute ischemia. No extra-axial or intracranial fluid collection. Vascular: Atherosclerosis of skullbase vasculature without hyperdense vessel or abnormal calcification. Skull: No fracture or focal lesion. Sinuses/Orbits: No acute findings. Scattered mucosal thickening of the paranasal sinuses with minimal bubbly debris  in the right sphenoid sinus. Other: No confluent scalp hematoma. IMPRESSION: 1. No acute intracranial abnormality. No skull fracture. 2. Mild paranasal sinus disease. Electronically Signed   By: Keith Rake M.D.   On: 07/06/2022 22:13   CT Cervical Spine Wo Contrast  Result Date: 07/06/2022 CLINICAL DATA:  Neck trauma, dangerous injury mechanism (Age 38-64y) EXAM: CT CERVICAL SPINE WITHOUT CONTRAST TECHNIQUE: Multidetector CT imaging of the cervical spine was performed without intravenous contrast. Multiplanar CT image reconstructions were also generated. RADIATION DOSE REDUCTION: This exam was performed according to the departmental dose-optimization program which includes automated exposure control, adjustment of the mA and/or kV according to patient size and/or use of iterative reconstruction technique. COMPARISON:  None Available. FINDINGS: Alignment: Minimal broad-based rightward curvature. No traumatic subluxation. Skull base and vertebrae: No acute fracture. Fragmented osteophytes arising from the anterior arch of C1 inferiorly. Vertebral body heights are maintained. The dens and skull base are intact. Soft tissues and spinal canal: No prevertebral fluid or swelling. No visible canal hematoma. Disc levels: Multilevel degenerative disc disease. Multiple small calcified posterior disc osteophyte complex. In combination with calcification in the posterior canal, there is mild canal stenosis at C6-C7. Upper chest: Assessed on concurrent chest CT, reported separately. Other: None. IMPRESSION: 1. No acute fracture or traumatic subluxation of the cervical spine. 2. Multilevel degenerative disc disease. Electronically Signed   By: Keith Rake M.D.   On: 07/06/2022 22:11   CT CHEST ABDOMEN PELVIS W CONTRAST  Result Date: 07/06/2022 CLINICAL DATA:  Blunt trauma.  Fall out of truck onto left side. EXAM: CT CHEST, ABDOMEN, AND PELVIS WITH CONTRAST TECHNIQUE: Multidetector CT imaging of the chest, abdomen  and pelvis was performed following the standard protocol during bolus administration of intravenous contrast. RADIATION DOSE REDUCTION: This exam was performed according to the departmental dose-optimization program which includes automated exposure control, adjustment of the mA and/or kV according to patient size and/or use of iterative reconstruction technique. CONTRAST:  184m OMNIPAQUE IOHEXOL 300 MG/ML  SOLN COMPARISON:  Abdominopelvic CT 04/11/2022 FINDINGS: CT CHEST FINDINGS Cardiovascular: No evidence  of acute aortic or vascular injury. Normal heart size. No pericardial effusion. Mediastinum/Nodes: No mediastinal hemorrhage or hematoma. Patulous esophagus. Paraesophageal varices. No adenopathy. Lungs/Pleura: No pneumothorax. No pulmonary contusion. No focal airspace disease. No pleural fluid. Musculoskeletal: Nondisplaced left lateral seventh rib fracture. No acute fracture of the sternum, thoracic spine, included clavicles or shoulder girdles. No confluent chest wall contusion. CT ABDOMEN PELVIS FINDINGS Hepatobiliary: Hepatic cirrhosis without evidence of hepatic injury. Cleft in the inferior right lobe is chronic and unchanged from prior exam. No evidence of focal liver lesion. Gallstones without abnormal gallbladder distention. Pancreas: No ductal dilatation or inflammation. Spleen: Chronic splenomegaly. There is a chronic splenic cleft inferiorly. No evidence of splenic injury. Adrenals/Urinary Tract: No adrenal hemorrhage or renal injury identified. Stable cyst in the mid right kidney. No imaging follow-up is recommended. Bladder is unremarkable. Stomach/Bowel: Presence of ascites limits assessment for acute bowel injury, allowing for this, there is no evidence of acute bowel inflammation or free air. Paraesophageal and perigastric varices. Question of rectal wall thickening. Vascular/Lymphatic: There is no evidence of acute vascular injury. Aortic atherosclerosis. The portal vein is patent prominent  in caliber air. Splenic vein is patent. There upper abdominal portosystemic collaterals. No convincing adenopathy. Reproductive: Brachytherapy seeds in the prostate. There is slight increased density right posterior to the prostate at site of prior hematoma. Other: Large volume abdominopelvic ascites, measuring simple fluid density. There is no free air. Umbilical and left inguinal hernias contain ascites. There is generalized edema of the anterior abdominal wall that is likely related to third spacing, no discrete abdominal wall hematoma. Musculoskeletal: No acute fracture of the pelvis or lumbar spine. Lumbar degenerative change. IMPRESSION: 1. Nondisplaced left lateral seventh rib fracture. 2. No additional acute traumatic injury to the chest, abdomen, or pelvis. 3. Hepatic cirrhosis with portal hypertension, splenomegaly, and large volume abdominopelvic ascites. Paraesophageal and perigastric varices. 4. Cholelithiasis. 5. Question of rectal wall thickening. Recommend correlation with physical exam. Aortic Atherosclerosis (ICD10-I70.0). Electronically Signed   By: Keith Rake M.D.   On: 07/06/2022 22:07   DG Shoulder Left  Result Date: 07/06/2022 CLINICAL DATA:  Fall and trauma to the left upper extremity. EXAM: LEFT SHOULDER - 2+ VIEW; LEFT ELBOW - COMPLETE 3+ VIEW COMPARISON:  None Available. FINDINGS: Faint linear lucency through the lateral aspect of the left clavicle suspicious for a nondisplaced fracture of the clavicle or acromion. Dedicated clavicle radiograph is recommended for better evaluation. Focal area of irregularity of the inferior glenoid, likely chronic and degenerative. A nondisplaced fracture is not excluded. No other acute fracture. There is no dislocation. Mild degenerative changes of the elbow. No joint effusion. The soft tissues are unremarkable. IMPRESSION: Findings suspicious for a nondisplaced fracture of the lateral aspect of the left clavicle or acromion. Dedicated clavicle  radiograph is recommended for better evaluation. Electronically Signed   By: Anner Crete M.D.   On: 07/06/2022 21:37   DG Elbow Complete Left  Result Date: 07/06/2022 CLINICAL DATA:  Fall and trauma to the left upper extremity. EXAM: LEFT SHOULDER - 2+ VIEW; LEFT ELBOW - COMPLETE 3+ VIEW COMPARISON:  None Available. FINDINGS: Faint linear lucency through the lateral aspect of the left clavicle suspicious for a nondisplaced fracture of the clavicle or acromion. Dedicated clavicle radiograph is recommended for better evaluation. Focal area of irregularity of the inferior glenoid, likely chronic and degenerative. A nondisplaced fracture is not excluded. No other acute fracture. There is no dislocation. Mild degenerative changes of the elbow. No joint effusion. The  soft tissues are unremarkable. IMPRESSION: Findings suspicious for a nondisplaced fracture of the lateral aspect of the left clavicle or acromion. Dedicated clavicle radiograph is recommended for better evaluation. Electronically Signed   By: Anner Crete M.D.   On: 07/06/2022 21:37    Procedures Procedures    Medications Ordered in ED Medications  Tdap (BOOSTRIX) injection 0.5 mL (has no administration in time range)    ED Course/ Medical Decision Making/ A&P                           Medical Decision Making Amount and/or Complexity of Data Reviewed Labs: ordered. Radiology: ordered.  Risk Prescription drug management.  This patient complains of left-sided chest pain left shoulder pain after a fall; this involves an extensive number of treatment Options and is a complaint that carries with it a high risk of complications and morbidity. The differential includes fracture, contusion, dislocation, pneumothorax, intrathoracic bleeding, intracranial bleeding  I ordered, reviewed and interpreted labs, which included CBC with low white count low hemoglobin low platelets stable from priors, chemistries with chronic CKD low  bicarb I ordered medication tetanus and reviewed PMP when indicated. I ordered imaging studies which included CT head C-spine chest abdomen and pelvis x-rays of left shoulder and elbow and I independently    visualized and interpreted imaging which showed left rib fracture no pneumothorax no hemothorax, possible left acromial fracture  Previous records obtained and reviewed in epic no recent admissions  Cardiac monitoring reviewed, normal sinus rhythm Social determinants considered, no significant barriers Critical Interventions: None  After the interventions stated above, I reevaluated the patient and found patient to be hemodynamically stable and minimally symptomatic except when he moves Admission and further testing considered, no indications for admission or further work-up at this time.  He is asking for some pain medicine, states he cannot take NSAIDs and is limited in the amount of Tylenol he can take secondary to his liver disease.  Will provide short prescription for oxycodone and counseled patient on use of medicine.  Return instructions discussed.          Final Clinical Impression(s) / ED Diagnoses Final diagnoses:  Fall, initial encounter  Closed fracture of one rib of left side, initial encounter  Closed displaced fracture of acromial process of left scapula, initial encounter  Abrasion of left elbow, initial encounter    Rx / DC Orders ED Discharge Orders          Ordered    oxyCODONE (ROXICODONE) 5 MG immediate release tablet  Every 6 hours PRN        07/06/22 2225              Hayden Rasmussen, MD 07/07/22 1123

## 2022-07-06 NOTE — ED Notes (Signed)
Ambulatory to tx room

## 2022-07-07 LAB — PATHOLOGIST SMEAR REVIEW

## 2022-07-09 ENCOUNTER — Encounter (HOSPITAL_COMMUNITY): Payer: Self-pay

## 2022-07-09 ENCOUNTER — Ambulatory Visit (HOSPITAL_COMMUNITY)
Admission: RE | Admit: 2022-07-09 | Discharge: 2022-07-09 | Disposition: A | Discharge status: Home / Self Care | Payer: BC Managed Care – PPO | Source: Ambulatory Visit | Attending: Gastroenterology | Admitting: Gastroenterology

## 2022-07-09 VITALS — BP 134/54 | HR 84 | Temp 98.5°F | Resp 18

## 2022-07-09 DIAGNOSIS — K7469 Other cirrhosis of liver: Secondary | ICD-10-CM | POA: Insufficient documentation

## 2022-07-09 DIAGNOSIS — R188 Other ascites: Secondary | ICD-10-CM | POA: Diagnosis not present

## 2022-07-09 LAB — BODY FLUID CELL COUNT WITH DIFFERENTIAL
Eos, Fluid: 2 %
Lymphs, Fluid: 46 %
Monocyte-Macrophage-Serous Fluid: 51 % (ref 50–90)
Neutrophil Count, Fluid: 1 % (ref 0–25)
Total Nucleated Cell Count, Fluid: 234 cu mm (ref 0–1000)

## 2022-07-09 MED ORDER — ALBUMIN HUMAN 25 % IV SOLN: 50.0000 g | Freq: Once | INTRAVENOUS | Status: AC

## 2022-07-09 MED ORDER — ALBUMIN HUMAN 25 % IV SOLN
INTRAVENOUS | Status: AC
  Administered 2022-07-09: 50 g via INTRAVENOUS
  Filled 2022-07-09: qty 200

## 2022-07-09 MED ORDER — SODIUM CHLORIDE FLUSH 0.9 % IV SOLN
INTRAVENOUS | Status: AC
  Administered 2022-07-09: 10 mL via INTRAVENOUS
  Filled 2022-07-09: qty 10

## 2022-07-09 NOTE — Progress Notes (Signed)
PT tolerated right sided paracentesis procedure and 50 G of IV albumin well today and 5 Liters of clear yellow fluid removed. Labs collected and sent to lab for processing. PT verbalized understanding of discharge instructions and ambulatory at departure with no acute distress noted.

## 2022-07-09 NOTE — Procedures (Signed)
   US guided RLQ paracentesis  5 L yellow fluid obtained Sent for labs per ordering MD Tolerated well  EBL: none

## 2022-07-10 LAB — PATHOLOGIST SMEAR REVIEW

## 2022-07-16 ENCOUNTER — Ambulatory Visit (HOSPITAL_COMMUNITY)
Admission: RE | Admit: 2022-07-16 | Discharge: 2022-07-16 | Disposition: A | Discharge status: Home / Self Care | Payer: BC Managed Care – PPO | Source: Ambulatory Visit | Attending: Gastroenterology | Admitting: Gastroenterology

## 2022-07-16 ENCOUNTER — Encounter (HOSPITAL_COMMUNITY): Payer: Self-pay

## 2022-07-16 DIAGNOSIS — K7469 Other cirrhosis of liver: Secondary | ICD-10-CM | POA: Insufficient documentation

## 2022-07-16 DIAGNOSIS — R188 Other ascites: Secondary | ICD-10-CM | POA: Diagnosis not present

## 2022-07-16 LAB — BODY FLUID CELL COUNT WITH DIFFERENTIAL
Eos, Fluid: 3 %
Lymphs, Fluid: 19 %
Monocyte-Macrophage-Serous Fluid: 76 % (ref 50–90)
Neutrophil Count, Fluid: 2 % (ref 0–25)
Total Nucleated Cell Count, Fluid: 222 cu mm (ref 0–1000)

## 2022-07-16 MED ORDER — ALBUMIN HUMAN 25 % IV SOLN
INTRAVENOUS | Status: AC
  Administered 2022-07-16: 50 g via INTRAVENOUS
  Filled 2022-07-16: qty 200

## 2022-07-16 MED ORDER — ALBUMIN HUMAN 25 % IV SOLN: 50.0000 g | Freq: Once | INTRAVENOUS | Status: AC

## 2022-07-16 NOTE — Procedures (Signed)
   US guided RLQ paracentesis 5 L yellow fluid obtained (Max per MD) Sent for labs per MD  Tolerated well  EBL: None

## 2022-07-16 NOTE — Progress Notes (Signed)
PT tolerated right sided paracentesis and 50G of IV albumin well today today. 5 liters of clear yellow fluid removed with labs collected and sent for processing. PT ambulatory at discharge with no acute distress noted and verbalized understanding of discharge instructions.

## 2022-07-20 ENCOUNTER — Inpatient Hospital Stay: Payer: BC Managed Care – PPO | Attending: Physician Assistant | Admitting: Hematology

## 2022-07-20 DIAGNOSIS — Z79899 Other long term (current) drug therapy: Secondary | ICD-10-CM | POA: Insufficient documentation

## 2022-07-20 DIAGNOSIS — D61818 Other pancytopenia: Secondary | ICD-10-CM | POA: Insufficient documentation

## 2022-07-20 DIAGNOSIS — D696 Thrombocytopenia, unspecified: Secondary | ICD-10-CM | POA: Diagnosis not present

## 2022-07-20 DIAGNOSIS — E7211 Homocystinuria: Secondary | ICD-10-CM

## 2022-07-20 DIAGNOSIS — D509 Iron deficiency anemia, unspecified: Secondary | ICD-10-CM | POA: Diagnosis present

## 2022-07-20 DIAGNOSIS — D708 Other neutropenia: Secondary | ICD-10-CM

## 2022-07-20 DIAGNOSIS — D649 Anemia, unspecified: Secondary | ICD-10-CM

## 2022-07-20 DIAGNOSIS — D500 Iron deficiency anemia secondary to blood loss (chronic): Secondary | ICD-10-CM

## 2022-07-20 LAB — SAMPLE TO BLOOD BANK

## 2022-07-20 LAB — CBC WITH DIFFERENTIAL/PLATELET
Abs Immature Granulocytes: 0 10*3/uL (ref 0.00–0.07)
Basophils Absolute: 0 10*3/uL (ref 0.0–0.1)
Basophils Relative: 0 %
Eosinophils Absolute: 0.6 10*3/uL — ABNORMAL HIGH (ref 0.0–0.5)
Eosinophils Relative: 21 %
HCT: 23.6 % — ABNORMAL LOW (ref 39.0–52.0)
Hemoglobin: 7.9 g/dL — ABNORMAL LOW (ref 13.0–17.0)
Immature Granulocytes: 0 %
Lymphocytes Relative: 17 %
Lymphs Abs: 0.5 10*3/uL — ABNORMAL LOW (ref 0.7–4.0)
MCH: 33.8 pg (ref 26.0–34.0)
MCHC: 33.5 g/dL (ref 30.0–36.0)
MCV: 100.9 fL — ABNORMAL HIGH (ref 80.0–100.0)
Monocytes Absolute: 0.3 10*3/uL (ref 0.1–1.0)
Monocytes Relative: 12 %
Neutro Abs: 1.3 10*3/uL — ABNORMAL LOW (ref 1.7–7.7)
Neutrophils Relative %: 50 %
Platelets: 49 10*3/uL — ABNORMAL LOW (ref 150–400)
RBC: 2.34 MIL/uL — ABNORMAL LOW (ref 4.22–5.81)
RDW: 15.3 % (ref 11.5–15.5)
WBC: 2.7 10*3/uL — ABNORMAL LOW (ref 4.0–10.5)
nRBC: 0 % (ref 0.0–0.2)

## 2022-07-21 ENCOUNTER — Other Ambulatory Visit: Payer: Self-pay

## 2022-07-21 DIAGNOSIS — D500 Iron deficiency anemia secondary to blood loss (chronic): Secondary | ICD-10-CM

## 2022-07-21 NOTE — Progress Notes (Signed)
Called patient and relayed message from provider. Patient is aware and coming Thursday for labs and transfusion on Friday.

## 2022-07-22 ENCOUNTER — Inpatient Hospital Stay: Payer: BC Managed Care – PPO

## 2022-07-23 ENCOUNTER — Other Ambulatory Visit (HOSPITAL_COMMUNITY)
Admission: RE | Admit: 2022-07-23 | Discharge: 2022-07-23 | Disposition: A | Discharge status: Home / Self Care | Payer: BC Managed Care – PPO | Source: Ambulatory Visit | Attending: Internal Medicine | Admitting: Internal Medicine

## 2022-07-23 ENCOUNTER — Inpatient Hospital Stay (HOSPITAL_BASED_OUTPATIENT_CLINIC_OR_DEPARTMENT_OTHER): Payer: BC Managed Care – PPO | Admitting: Hematology

## 2022-07-23 DIAGNOSIS — D500 Iron deficiency anemia secondary to blood loss (chronic): Secondary | ICD-10-CM | POA: Insufficient documentation

## 2022-07-23 DIAGNOSIS — D509 Iron deficiency anemia, unspecified: Secondary | ICD-10-CM | POA: Diagnosis not present

## 2022-07-23 LAB — CBC WITH DIFFERENTIAL/PLATELET
Abs Immature Granulocytes: 0.01 10*3/uL (ref 0.00–0.07)
Basophils Absolute: 0 10*3/uL (ref 0.0–0.1)
Basophils Relative: 1 %
Eosinophils Absolute: 0.6 10*3/uL — ABNORMAL HIGH (ref 0.0–0.5)
Eosinophils Relative: 22 %
HCT: 25 % — ABNORMAL LOW (ref 39.0–52.0)
Hemoglobin: 8.4 g/dL — ABNORMAL LOW (ref 13.0–17.0)
Immature Granulocytes: 0 %
Lymphocytes Relative: 17 %
Lymphs Abs: 0.5 10*3/uL — ABNORMAL LOW (ref 0.7–4.0)
MCH: 34.6 pg — ABNORMAL HIGH (ref 26.0–34.0)
MCHC: 33.6 g/dL (ref 30.0–36.0)
MCV: 102.9 fL — ABNORMAL HIGH (ref 80.0–100.0)
Monocytes Absolute: 0.3 10*3/uL (ref 0.1–1.0)
Monocytes Relative: 11 %
Neutro Abs: 1.4 10*3/uL — ABNORMAL LOW (ref 1.7–7.7)
Neutrophils Relative %: 49 %
Platelets: 44 10*3/uL — ABNORMAL LOW (ref 150–400)
RBC: 2.43 MIL/uL — ABNORMAL LOW (ref 4.22–5.81)
RDW: 15 % (ref 11.5–15.5)
WBC: 2.8 10*3/uL — ABNORMAL LOW (ref 4.0–10.5)
nRBC: 0 % (ref 0.0–0.2)

## 2022-07-23 LAB — IRON AND TIBC
Iron: 67 ug/dL (ref 45–182)
Saturation Ratios: 29 % (ref 17.9–39.5)
TIBC: 231 ug/dL — ABNORMAL LOW (ref 250–450)
UIBC: 164 ug/dL

## 2022-07-23 LAB — FERRITIN: Ferritin: 225 ng/mL (ref 24–336)

## 2022-07-23 LAB — PREPARE RBC (CROSSMATCH)

## 2022-07-23 LAB — T4, FREE: Free T4: 1.89 ng/dL — ABNORMAL HIGH (ref 0.61–1.12)

## 2022-07-23 LAB — TSH: TSH: 4.747 u[IU]/mL — ABNORMAL HIGH (ref 0.350–4.500)

## 2022-07-23 NOTE — Progress Notes (Signed)
Patient aware.

## 2022-07-23 NOTE — Progress Notes (Signed)
No need for blood transfusion this week. Please schedule for repeat CBC/BB sample in 2 weeks.

## 2022-07-24 ENCOUNTER — Inpatient Hospital Stay: Payer: BC Managed Care – PPO

## 2022-07-24 ENCOUNTER — Ambulatory Visit (HOSPITAL_COMMUNITY)
Admission: RE | Admit: 2022-07-24 | Discharge: 2022-07-24 | Disposition: A | Discharge status: Home / Self Care | Payer: BC Managed Care – PPO | Source: Ambulatory Visit | Attending: Gastroenterology | Admitting: Gastroenterology

## 2022-07-24 ENCOUNTER — Encounter (HOSPITAL_COMMUNITY): Payer: Self-pay

## 2022-07-24 DIAGNOSIS — R188 Other ascites: Secondary | ICD-10-CM

## 2022-07-24 DIAGNOSIS — K7469 Other cirrhosis of liver: Secondary | ICD-10-CM

## 2022-07-24 LAB — BODY FLUID CELL COUNT WITH DIFFERENTIAL
Eos, Fluid: 0 %
Lymphs, Fluid: 29 %
Monocyte-Macrophage-Serous Fluid: 61 % (ref 50–90)
Neutrophil Count, Fluid: 10 % (ref 0–25)
Total Nucleated Cell Count, Fluid: 232 cu mm (ref 0–1000)

## 2022-07-24 MED ORDER — SODIUM CHLORIDE FLUSH 0.9 % IV SOLN
INTRAVENOUS | Status: AC
  Administered 2022-07-24: 10 mL
  Filled 2022-07-24: qty 10

## 2022-07-24 MED ORDER — ALBUMIN HUMAN 25 % IV SOLN
INTRAVENOUS | Status: AC
  Administered 2022-07-24: 50 g via INTRAVENOUS
  Filled 2022-07-24: qty 200

## 2022-07-24 NOTE — Progress Notes (Signed)
PT tolerated left sided paracentesis procedure and 50G of IV albumin well today and 5 Liters of hazy yellow colored ascites removed with labs collected and sent for processing. PT verbalized understanding of discharge instructions and ambulatory at departure with no acute distress noted.

## 2022-07-27 LAB — BPAM RBC
Blood Product Expiration Date: 202310192359
Unit Type and Rh: 1700

## 2022-07-27 LAB — TYPE AND SCREEN
ABO/RH(D): B NEG
Antibody Screen: NEGATIVE
Unit division: 0

## 2022-08-03 ENCOUNTER — Other Ambulatory Visit (HOSPITAL_COMMUNITY): Payer: Self-pay | Admitting: Unknown Physician Specialty

## 2022-08-03 ENCOUNTER — Ambulatory Visit (HOSPITAL_COMMUNITY)
Admission: RE | Admit: 2022-08-03 | Discharge: 2022-08-03 | Disposition: A | Discharge status: Home / Self Care | Payer: BC Managed Care – PPO | Source: Ambulatory Visit | Attending: Gastroenterology | Admitting: Gastroenterology

## 2022-08-03 ENCOUNTER — Other Ambulatory Visit: Payer: Self-pay | Admitting: Unknown Physician Specialty

## 2022-08-03 ENCOUNTER — Encounter (HOSPITAL_COMMUNITY): Payer: Self-pay

## 2022-08-03 DIAGNOSIS — R188 Other ascites: Secondary | ICD-10-CM

## 2022-08-03 DIAGNOSIS — K7469 Other cirrhosis of liver: Secondary | ICD-10-CM | POA: Diagnosis present

## 2022-08-03 LAB — BODY FLUID CELL COUNT WITH DIFFERENTIAL
Eos, Fluid: 0 %
Lymphs, Fluid: 74 %
Monocyte-Macrophage-Serous Fluid: 21 % — ABNORMAL LOW (ref 50–90)
Neutrophil Count, Fluid: 5 % (ref 0–25)
Total Nucleated Cell Count, Fluid: 154 cu mm (ref 0–1000)

## 2022-08-03 MED ORDER — ALBUMIN HUMAN 25 % IV SOLN
INTRAVENOUS | Status: AC
  Administered 2022-08-03: 50 g via INTRAVENOUS
  Filled 2022-08-03: qty 200

## 2022-08-03 MED ORDER — SODIUM CHLORIDE FLUSH 0.9 % IV SOLN
INTRAVENOUS | Status: AC
  Administered 2022-08-03: 10 mL
  Filled 2022-08-03: qty 10

## 2022-08-03 MED ORDER — ALBUMIN HUMAN 25 % IV SOLN: 50.0000 g | Freq: Once | INTRAVENOUS | Status: AC

## 2022-08-03 NOTE — Procedures (Signed)
PreOperative Dx: Cryptogenic cirrhosis, ascites Postoperative Dx: Cryptogenic cirrhosis, ascites Procedure:   US guided paracentesis Radiologist:  Thornton Papas Anesthesia:  10 ml of1% lidocaine Specimen:  4.2 L of yellow ascitic fluid EBL:   < 1 ml Complications:  None

## 2022-08-03 NOTE — Progress Notes (Signed)
PT tolerated right sided paracentesis procedure and 50G of IV albumin well today and 4.2 Liters of clear yellow fluid removed with labs collected and sent for processing. PT verbalized understanding of discharge instructions and ambulatory at departure with no acute distress noted.

## 2022-08-05 ENCOUNTER — Inpatient Hospital Stay: Payer: BC Managed Care – PPO | Attending: Physician Assistant

## 2022-08-05 DIAGNOSIS — D72819 Decreased white blood cell count, unspecified: Secondary | ICD-10-CM | POA: Insufficient documentation

## 2022-08-05 DIAGNOSIS — D696 Thrombocytopenia, unspecified: Secondary | ICD-10-CM | POA: Insufficient documentation

## 2022-08-05 DIAGNOSIS — D509 Iron deficiency anemia, unspecified: Secondary | ICD-10-CM | POA: Insufficient documentation

## 2022-08-05 DIAGNOSIS — C61 Malignant neoplasm of prostate: Secondary | ICD-10-CM | POA: Diagnosis not present

## 2022-08-05 DIAGNOSIS — D500 Iron deficiency anemia secondary to blood loss (chronic): Secondary | ICD-10-CM

## 2022-08-05 LAB — CBC WITH DIFFERENTIAL/PLATELET
Abs Immature Granulocytes: 0.01 10*3/uL (ref 0.00–0.07)
Basophils Absolute: 0 10*3/uL (ref 0.0–0.1)
Basophils Relative: 1 %
Eosinophils Absolute: 0.6 10*3/uL — ABNORMAL HIGH (ref 0.0–0.5)
Eosinophils Relative: 19 %
HCT: 26 % — ABNORMAL LOW (ref 39.0–52.0)
Hemoglobin: 8.4 g/dL — ABNORMAL LOW (ref 13.0–17.0)
Immature Granulocytes: 0 %
Lymphocytes Relative: 16 %
Lymphs Abs: 0.5 10*3/uL — ABNORMAL LOW (ref 0.7–4.0)
MCH: 33.6 pg (ref 26.0–34.0)
MCHC: 32.3 g/dL (ref 30.0–36.0)
MCV: 104 fL — ABNORMAL HIGH (ref 80.0–100.0)
Monocytes Absolute: 0.3 10*3/uL (ref 0.1–1.0)
Monocytes Relative: 10 %
Neutro Abs: 1.7 10*3/uL (ref 1.7–7.7)
Neutrophils Relative %: 54 %
Platelets: 44 10*3/uL — ABNORMAL LOW (ref 150–400)
RBC: 2.5 MIL/uL — ABNORMAL LOW (ref 4.22–5.81)
RDW: 14.8 % (ref 11.5–15.5)
WBC: 3.1 10*3/uL — ABNORMAL LOW (ref 4.0–10.5)
nRBC: 0 % (ref 0.0–0.2)

## 2022-08-05 LAB — SAMPLE TO BLOOD BANK

## 2022-08-06 ENCOUNTER — Inpatient Hospital Stay: Payer: BC Managed Care – PPO

## 2022-08-13 ENCOUNTER — Ambulatory Visit (HOSPITAL_COMMUNITY)
Admission: RE | Admit: 2022-08-13 | Discharge: 2022-08-13 | Disposition: A | Discharge status: Home / Self Care | Payer: BC Managed Care – PPO | Source: Ambulatory Visit | Attending: Unknown Physician Specialty | Admitting: Unknown Physician Specialty

## 2022-08-13 ENCOUNTER — Encounter (HOSPITAL_COMMUNITY): Payer: Self-pay

## 2022-08-13 DIAGNOSIS — K7460 Unspecified cirrhosis of liver: Secondary | ICD-10-CM | POA: Insufficient documentation

## 2022-08-13 DIAGNOSIS — R188 Other ascites: Secondary | ICD-10-CM | POA: Insufficient documentation

## 2022-08-13 LAB — BODY FLUID CELL COUNT WITH DIFFERENTIAL
Eos, Fluid: 2 %
Lymphs, Fluid: 44 %
Monocyte-Macrophage-Serous Fluid: 50 % (ref 50–90)
Neutrophil Count, Fluid: 4 % (ref 0–25)
Total Nucleated Cell Count, Fluid: 216 cu mm (ref 0–1000)

## 2022-08-13 LAB — GRAM STAIN

## 2022-08-13 MED ORDER — ALBUMIN HUMAN 25 % IV SOLN: 50.0000 g | Freq: Once | INTRAVENOUS | Status: AC

## 2022-08-13 MED ORDER — SODIUM CHLORIDE FLUSH 0.9 % IV SOLN
INTRAVENOUS | Status: AC
  Administered 2022-08-13: 10 mL via INTRAVENOUS
  Filled 2022-08-13: qty 10

## 2022-08-13 MED ORDER — ALBUMIN HUMAN 25 % IV SOLN
INTRAVENOUS | Status: AC
  Administered 2022-08-13: 50 g via INTRAVENOUS
  Filled 2022-08-13: qty 200

## 2022-08-13 NOTE — Progress Notes (Signed)
PT tolerated left sided paracentesis procedure and 50G of Albumin well today and 5 Liters of clear yellow ascites removed with labs collected and sent for processing. PT verbalized understanding of discharge instructions and ambulatory at departure with no acute distress noted.

## 2022-08-18 ENCOUNTER — Other Ambulatory Visit: Payer: Self-pay

## 2022-08-18 LAB — CULTURE, BODY FLUID W GRAM STAIN -BOTTLE: Culture: NO GROWTH

## 2022-08-19 ENCOUNTER — Inpatient Hospital Stay: Payer: BC Managed Care – PPO

## 2022-08-19 DIAGNOSIS — D509 Iron deficiency anemia, unspecified: Secondary | ICD-10-CM | POA: Diagnosis not present

## 2022-08-19 DIAGNOSIS — D500 Iron deficiency anemia secondary to blood loss (chronic): Secondary | ICD-10-CM

## 2022-08-19 LAB — CBC WITH DIFFERENTIAL/PLATELET
Abs Immature Granulocytes: 0.01 10*3/uL (ref 0.00–0.07)
Basophils Absolute: 0 10*3/uL (ref 0.0–0.1)
Basophils Relative: 1 %
Eosinophils Absolute: 0.6 10*3/uL — ABNORMAL HIGH (ref 0.0–0.5)
Eosinophils Relative: 20 %
HCT: 25.6 % — ABNORMAL LOW (ref 39.0–52.0)
Hemoglobin: 8.4 g/dL — ABNORMAL LOW (ref 13.0–17.0)
Immature Granulocytes: 0 %
Lymphocytes Relative: 18 %
Lymphs Abs: 0.6 10*3/uL — ABNORMAL LOW (ref 0.7–4.0)
MCH: 33.5 pg (ref 26.0–34.0)
MCHC: 32.8 g/dL (ref 30.0–36.0)
MCV: 102 fL — ABNORMAL HIGH (ref 80.0–100.0)
Monocytes Absolute: 0.4 10*3/uL (ref 0.1–1.0)
Monocytes Relative: 14 %
Neutro Abs: 1.5 10*3/uL — ABNORMAL LOW (ref 1.7–7.7)
Neutrophils Relative %: 47 %
Platelets: 49 10*3/uL — ABNORMAL LOW (ref 150–400)
RBC: 2.51 MIL/uL — ABNORMAL LOW (ref 4.22–5.81)
RDW: 14.5 % (ref 11.5–15.5)
WBC: 3.2 10*3/uL — ABNORMAL LOW (ref 4.0–10.5)
nRBC: 0 % (ref 0.0–0.2)

## 2022-08-19 LAB — SAMPLE TO BLOOD BANK

## 2022-08-20 ENCOUNTER — Encounter (HOSPITAL_COMMUNITY): Payer: Self-pay

## 2022-08-20 ENCOUNTER — Ambulatory Visit (HOSPITAL_COMMUNITY)
Admission: RE | Admit: 2022-08-20 | Discharge: 2022-08-20 | Disposition: A | Discharge status: Home / Self Care | Payer: BC Managed Care – PPO | Source: Ambulatory Visit | Attending: Unknown Physician Specialty | Admitting: Unknown Physician Specialty

## 2022-08-20 DIAGNOSIS — K7460 Unspecified cirrhosis of liver: Secondary | ICD-10-CM | POA: Diagnosis present

## 2022-08-20 DIAGNOSIS — R188 Other ascites: Secondary | ICD-10-CM | POA: Insufficient documentation

## 2022-08-20 LAB — BODY FLUID CELL COUNT WITH DIFFERENTIAL
Eos, Fluid: 0 %
Lymphs, Fluid: 61 %
Monocyte-Macrophage-Serous Fluid: 37 % — ABNORMAL LOW (ref 50–90)
Neutrophil Count, Fluid: 2 % (ref 0–25)
Total Nucleated Cell Count, Fluid: 126 cu mm (ref 0–1000)

## 2022-08-20 MED ORDER — ALBUMIN HUMAN 25 % IV SOLN: 50.0000 g | Freq: Once | INTRAVENOUS | Status: AC

## 2022-08-20 MED ORDER — SODIUM CHLORIDE FLUSH 0.9 % IV SOLN
INTRAVENOUS | Status: AC
  Administered 2022-08-20: 10 mL
  Filled 2022-08-20: qty 10

## 2022-08-20 MED ORDER — ALBUMIN HUMAN 25 % IV SOLN
INTRAVENOUS | Status: AC
  Administered 2022-08-20: 50 g via INTRAVENOUS
  Filled 2022-08-20: qty 200

## 2022-08-20 NOTE — Procedures (Signed)
   US guided LLQ paracentesis  5 liter clear yellow fluid- (max) Sent for labs per MD  Tolerated well  EBL: scant

## 2022-08-20 NOTE — Progress Notes (Signed)
PT tolerated left sided paracentesis procedure and 50G of Albumin well today and 5 Liters of clear yellow ascites removed with labs collected and sent for processing. PT verbalized understanding of discharge instructions and ambulatory at departure with no acute distress noted.

## 2022-08-21 ENCOUNTER — Other Ambulatory Visit (HOSPITAL_COMMUNITY): Payer: Self-pay | Admitting: Unknown Physician Specialty

## 2022-08-21 DIAGNOSIS — K7460 Unspecified cirrhosis of liver: Secondary | ICD-10-CM

## 2022-08-24 ENCOUNTER — Other Ambulatory Visit (HOSPITAL_COMMUNITY): Payer: Self-pay | Admitting: Unknown Physician Specialty

## 2022-08-24 DIAGNOSIS — R188 Other ascites: Secondary | ICD-10-CM

## 2022-08-27 ENCOUNTER — Ambulatory Visit (HOSPITAL_COMMUNITY): Payer: BC Managed Care – PPO

## 2022-08-27 ENCOUNTER — Ambulatory Visit (HOSPITAL_COMMUNITY)
Admission: RE | Admit: 2022-08-27 | Discharge: 2022-08-27 | Disposition: A | Discharge status: Home / Self Care | Payer: BC Managed Care – PPO | Source: Ambulatory Visit | Attending: Unknown Physician Specialty | Admitting: Unknown Physician Specialty

## 2022-08-27 ENCOUNTER — Encounter (HOSPITAL_COMMUNITY): Payer: Self-pay

## 2022-08-27 DIAGNOSIS — R188 Other ascites: Secondary | ICD-10-CM | POA: Diagnosis present

## 2022-08-27 DIAGNOSIS — K7460 Unspecified cirrhosis of liver: Secondary | ICD-10-CM | POA: Diagnosis present

## 2022-08-27 LAB — BODY FLUID CELL COUNT WITH DIFFERENTIAL
Eos, Fluid: 3 %
Lymphs, Fluid: 73 %
Monocyte-Macrophage-Serous Fluid: 18 % — ABNORMAL LOW (ref 50–90)
Neutrophil Count, Fluid: 6 % (ref 0–25)
Total Nucleated Cell Count, Fluid: 186 cu mm (ref 0–1000)

## 2022-08-27 MED ORDER — ALBUMIN HUMAN 25 % IV SOLN
INTRAVENOUS | Status: AC
  Administered 2022-08-27: 50 g via INTRAVENOUS
  Filled 2022-08-27: qty 200

## 2022-08-27 MED ORDER — ALBUMIN HUMAN 25 % IV SOLN: 50.0000 g | Freq: Once | INTRAVENOUS | Status: AC

## 2022-08-27 NOTE — Progress Notes (Signed)
PT tolerated right sided paracentesis and 50G of IV albumin well today and 5 Liters of ascites removed with labs collected and sent for processing. PT verbalized understanding of discharge instructions and ambulatory at departure with no acute distress noted.

## 2022-08-28 LAB — PATHOLOGIST SMEAR REVIEW

## 2022-09-03 ENCOUNTER — Ambulatory Visit (HOSPITAL_COMMUNITY)
Admission: RE | Admit: 2022-09-03 | Discharge: 2022-09-03 | Disposition: A | Discharge status: Home / Self Care | Payer: BC Managed Care – PPO | Source: Ambulatory Visit | Attending: Unknown Physician Specialty | Admitting: Unknown Physician Specialty

## 2022-09-03 ENCOUNTER — Encounter (HOSPITAL_COMMUNITY): Payer: Self-pay

## 2022-09-03 DIAGNOSIS — K7460 Unspecified cirrhosis of liver: Secondary | ICD-10-CM | POA: Diagnosis present

## 2022-09-03 DIAGNOSIS — R188 Other ascites: Secondary | ICD-10-CM | POA: Diagnosis present

## 2022-09-03 LAB — BODY FLUID CELL COUNT WITH DIFFERENTIAL
Eos, Fluid: 5 %
Lymphs, Fluid: 70 %
Monocyte-Macrophage-Serous Fluid: 20 % — ABNORMAL LOW (ref 50–90)
Neutrophil Count, Fluid: 5 % (ref 0–25)
Total Nucleated Cell Count, Fluid: 228 cu mm (ref 0–1000)

## 2022-09-03 MED ORDER — ALBUMIN HUMAN 25 % IV SOLN: 50.0000 g | Freq: Once | INTRAVENOUS | Status: AC

## 2022-09-03 MED ORDER — ALBUMIN HUMAN 25 % IV SOLN
INTRAVENOUS | Status: AC
  Administered 2022-09-03: 50 g via INTRAVENOUS
  Filled 2022-09-03: qty 200

## 2022-09-03 MED ORDER — SODIUM CHLORIDE FLUSH 0.9 % IV SOLN
INTRAVENOUS | Status: AC
  Administered 2022-09-03: 10 mL via INTRAVENOUS
  Filled 2022-09-03: qty 10

## 2022-09-03 NOTE — Progress Notes (Signed)
Pt is aware.  

## 2022-09-03 NOTE — Procedures (Addendum)
   US guided RLQ paracentesis  5 Liter (Max per MD) cloudy yellow fluid Sent for labs per MD  Tolerated well  EBL: scant

## 2022-09-03 NOTE — Progress Notes (Signed)
Right sided paracentesis procedure and 50G of Albumin tolerated well today and 5L of yellow ascites removed with labs sent for processing. PT verbalized understanding of discharge instructions and ambulatory at departure with no acute distress noted.

## 2022-09-08 ENCOUNTER — Encounter: Payer: Self-pay | Admitting: Internal Medicine

## 2022-09-08 ENCOUNTER — Ambulatory Visit (INDEPENDENT_AMBULATORY_CARE_PROVIDER_SITE_OTHER): Payer: BC Managed Care – PPO | Admitting: Internal Medicine

## 2022-09-08 VITALS — BP 136/61 | HR 75 | Ht 67.0 in | Wt 204.2 lb

## 2022-09-08 DIAGNOSIS — Z0001 Encounter for general adult medical examination with abnormal findings: Secondary | ICD-10-CM

## 2022-09-08 DIAGNOSIS — Z23 Encounter for immunization: Secondary | ICD-10-CM | POA: Diagnosis not present

## 2022-09-08 DIAGNOSIS — I10 Essential (primary) hypertension: Secondary | ICD-10-CM | POA: Diagnosis not present

## 2022-09-08 DIAGNOSIS — C61 Malignant neoplasm of prostate: Secondary | ICD-10-CM

## 2022-09-08 DIAGNOSIS — E1169 Type 2 diabetes mellitus with other specified complication: Secondary | ICD-10-CM

## 2022-09-08 DIAGNOSIS — Z794 Long term (current) use of insulin: Secondary | ICD-10-CM

## 2022-09-08 DIAGNOSIS — E782 Mixed hyperlipidemia: Secondary | ICD-10-CM

## 2022-09-08 DIAGNOSIS — K7460 Unspecified cirrhosis of liver: Secondary | ICD-10-CM

## 2022-09-08 DIAGNOSIS — Z5309 Procedure and treatment not carried out because of other contraindication: Secondary | ICD-10-CM | POA: Insufficient documentation

## 2022-09-08 HISTORY — DX: Encounter for general adult medical examination with abnormal findings: Z00.01

## 2022-09-08 NOTE — Assessment & Plan Note (Addendum)
Physical exam as documented. Fasting blood tests ordered. Flu vaccine today. Shingrix vaccine at local pharmacy.

## 2022-09-08 NOTE — Assessment & Plan Note (Signed)
Followed by [facility] and [facility] Has esophageal varices and pancytopenia Has had Hep A and B vaccines Liver enzymes stable overall Has recurrent ascites, gets paracentesis every, upon ciprofloxacin for SBP ppx

## 2022-09-08 NOTE — Assessment & Plan Note (Addendum)
BP Readings from Last 1 Encounters:  09/08/22 136/61   Not on diuretics now due to episodes of hypotension

## 2022-09-08 NOTE — Assessment & Plan Note (Addendum)
  Lab Results  Component Value Date   HGBA1C 5.2 03/02/2022   HGBA1C 5.2 03/02/2022   Associated with HLD On Levemir 28 U qHS and Starlix, followed by Endocrinology in [place], [place], advised to continue to follow up due to labile glycemic profile Gvoke for hypoglycemia Advised to follow diabetic diet Not on statin due to liver cirrhosis F/u CMP and lipid panel Diabetic eye exam: Advised to follow up with Ophthalmology for diabetic eye exam

## 2022-09-08 NOTE — Progress Notes (Signed)
Established Patient Office Visit  Subjective:  Patient ID: Michael Harmon, male    DOB: 07-06-1961  Age: 61 y.o. MRN: [id number]  CC:  Chief Complaint  Patient presents with   Annual Exam    CPE, has an appointment for liver transplant consult soon.    HPI Michael Harmon is a 61 y.o. male with past medical history of HTN, hepatic cirrhosis, portal hypertension, esophageal varices, celiac disease, type II DM, HLD, thrombocytopenia and IDA who presents for annual physical.  Celiac disease, hepatic cirrhosis: He has chronic, recurrent ascites and gets paracentesis every week.  He is being by evaluated for liver transplant at [facility]. He has history of chronic diarrhea despite following gluten-free diet.  He is followed by [facility] and [facility].  He has history of esophageal varices as well.  He used to get surveillance EGD locally, but has to get it at [facility] now due to his thrombocytopenia.  Last EGD showed esophageal varices, for which he needs repeat surveillance EGD. He denies any melena or hematochezia currently. He has chronic leg swelling in the setting of hypoalbuminemia.  HTN: His BP is well controlled now.  His diuretics have been discontinued due to episodes of hypoglycemia during recent hospitalization.  He currently denies any dizziness, chest pain or palpitations.  Type II DM: He takes Starlix with meals and Levemir 28 U qHS.  Follows up with endocrinology.  He denies any recent episodes of hypoglycemia.  He has completed radiotherapy for prostate cancer.  He is followed by radiation oncology, oncology and urology currently.  He denies any hematuria, dysuria or urinary hesitancy or resistance currently.   Past Medical History:  Diagnosis Date   Allergy    nose and sinus problems   Anemia    Asthma    Celiac disease    Cirrhosis, cryptogenic (HCC)    completed Hep A and B vaccines   DM (diabetes mellitus) (HCC)    GERD (gastroesophageal reflux disease)     Hyperlipidemia    diet controlled now with 100 pound weight loss   Hypertension    Iron deficiency anemia due to chronic blood loss 08/13/2021   Malignant neoplasm of prostate ([place]) 10/03/2021   Murmur, heart 03/02/2022   Narrow angle glaucoma suspect of both eyes    Renal cyst 09/03/2017   right   Situational depression 07/06/2017   Splenomegaly    Thrombocytopenia ([place])    hematology, ?related to chol med (tricor) and glimeripide, just being monitored, above 100,000    Past Surgical History:  Procedure Laterality Date   BIOPSY  07/09/2016   Procedure: BIOPSY;  Surgeon: [name], MD;  [facility];  Service: Endoscopy;;  duodenal, gastric, esophaageal   BIOPSY  07/31/2021   Procedure: BIOPSY;  Surgeon: [name], MD;  [facility];  Service: Endoscopy;;   CLOSED MANIPULATION SHOULDER     COLONOSCOPY  11/2015   Dr. Posey Pronto: cecal bx neg for microscopic colitis. ascending colon polyp (adenomatous)   COLONOSCOPY WITH PROPOFOL N/A 07/31/2021   Procedure: COLONOSCOPY WITH PROPOFOL;  Surgeon: [name], MD;  [facility];  Service: Endoscopy;  Laterality: N/A;  7:30am   ESOPHAGEAL BANDING N/A 08/04/2017   Procedure: ESOPHAGEAL BANDING;  Surgeon: [name], MD;  [facility];  Service: Endoscopy;  Laterality: N/A;  esophageal varices banding   ESOPHAGEAL BANDING N/A 10/13/2017   Procedure: ESOPHAGEAL BANDING;  Surgeon: [name],  MD;  [facility];  Service: Endoscopy;  Laterality: N/A;   ESOPHAGEAL BANDING N/A 04/09/2020   Procedure: ESOPHAGEAL BANDING;  Surgeon: [name], MD;  [facility];  Service: Endoscopy;  Laterality: N/A;   ESOPHAGOGASTRODUODENOSCOPY N/A 07/09/2016   Procedure: ESOPHAGOGASTRODUODENOSCOPY (EGD);  Surgeon: [name], MD;  [facility];  Service: Endoscopy;  Laterality: N/A;  730   ESOPHAGOGASTRODUODENOSCOPY N/A 08/04/2017   Procedure: ESOPHAGOGASTRODUODENOSCOPY  (EGD);  Surgeon: [name], MD;  [facility];  Service: Endoscopy;  Laterality: N/A;  7:30am   ESOPHAGOGASTRODUODENOSCOPY N/A 10/13/2017   Dr. [name]: grade 2 esophageal varices status post banding, portal hypertensive gastropathy   ESOPHAGOGASTRODUODENOSCOPY  11/23/2019   [name]: Esophageal varices, 4 columns of grade 2-3 status post banding.  Varices more prominent than seen in December 2018.  Portal gastropathy.  Normal-appearing small bowel.   ESOPHAGOGASTRODUODENOSCOPY (EGD) WITH PROPOFOL N/A 04/09/2020   Dr. [name]: 4 columns of grade 2/grade 3 esophageal varices somewhat recalcitrant.  Status post esophageal band ligation today.  Portal gastropathy.  Normal-appearing small bowel.   ESOPHAGOGASTRODUODENOSCOPY (EGD) WITH PROPOFOL N/A 07/31/2021   Procedure: ESOPHAGOGASTRODUODENOSCOPY (EGD) WITH PROPOFOL;  Surgeon: [name], MD;  [facility];  Service: Endoscopy;  Laterality: N/A;   GOLD SEED IMPLANT N/A 01/15/2022   Procedure: GOLD SEED IMPLANT;  Surgeon: Cleon Gustin, MD;  Location: [facility];  Service: Urology;  Laterality: N/A;   SPACE OAR INSTILLATION N/A 01/15/2022   Procedure: SPACE OAR INSTILLATION;  Surgeon: Cleon Gustin, MD;  Location: [facility];  Service: Urology;  Laterality: N/A;   US PARACENTESIS  06/10/2022    Family History  Problem Relation Age of Onset   Other Mother        chronic diarrhea   COPD Mother    Heart disease Mother        died at 47   Arthritis Mother    Cancer Mother    Depression Mother    Diabetes Mother    Hyperlipidemia Mother    Hypertension Mother    Stroke Mother    Arthritis Father    Asthma Father    Birth defects Father    Heart disease Father        aortic valve replaced   Heart disease Maternal Grandmother    Stroke Maternal Grandmother    Heart disease Maternal Grandfather    Stroke Maternal Grandfather    Diabetes Paternal Grandmother    Stroke Paternal Grandfather    Heart disease Paternal  Grandfather    Liver disease Neg Hx    Colon cancer Neg Hx     Social History   Socioeconomic History   Marital status: Married    Spouse name: Melissa   Number of children: 0   Years of education: 14   Highest education level: Not on file  Occupational History   Occupation: Customer service manager, [place], travels  Tobacco Use   Smoking status: Never   Smokeless tobacco: Never  Vaping Use   Vaping Use: Never used  Substance and Sexual Activity   Alcohol use: Not Currently   Drug use: No   Sexual activity: Not Currently  Other Topics Concern   Not on file  Social History Narrative   Lives at home with wife Melissa   One dog.   Has no children.   Eats all food groups.   Works for [employer] and [employer].  Social Determinants of Health   Financial Resource Strain: Not on file  Food Insecurity: Not on file  Transportation Needs: Not on file  Physical Activity: Not on file  Stress: Not on file  Social Connections: Not on file  Intimate Partner Violence: Not on file    Outpatient Medications Prior to Visit  Medication Sig Dispense Refill   acetaminophen (TYLENOL) 325 MG tablet Take 650 mg by mouth every 6 (six) hours as needed for moderate pain.     BD PEN NEEDLE NANO 2ND GEN 32G X 4 MM MISC USE TO INJECT INSULIN 4 TIMES DAILY E11.65     BENADRYL ITCH STOPPING 2 % GEL Apply topically.     Cholecalciferol (VITAMIN D3) 125 MCG (5000 UT) TABS Take 5,000 Units by mouth in the morning.     ciprofloxacin (CIPRO) 500 MG tablet Take 500 mg by mouth daily.     Continuous Blood Gluc Receiver (FREESTYLE LIBRE 2 READER) DEVI USE AS DIRECTED TO TEST GLUCOSE LEVELS AT LEAST 4 TIMES DAILY E11.9     Continuous Blood Gluc Sensor (FREESTYLE LIBRE 2 SENSOR) MISC USE AS DIRECTED FOR BLOOD SUGAR MONITORING. CHANGE EVERY 14 DAYS     folic acid (FOLVITE) 0.5 MG tablet Take 0.5 mg by mouth daily.     Glucagon (GVOKE HYPOPEN 2-PACK) 1 MG/0.2ML SOAJ INJECT 1 MG  AS NEEDED BY SUBCUTANEOUS ROUTE.     hydrocortisone (ANUSOL-HC) 25 MG suppository Place 1 suppository (25 mg total) rectally 2 (two) times daily as needed for hemorrhoids or anal itching. 12 suppository 2   LEVEMIR FLEXTOUCH 100 UNIT/ML Pen Inject 28 Units into the skin at bedtime.     Multiple Vitamin (MULTIVITAMIN WITH MINERALS) TABS tablet Take 1 tablet by mouth in the morning.     nateglinide (STARLIX) 60 MG tablet Take 1 tablet by mouth 3 (three) times daily.     oxyCODONE (ROXICODONE) 5 MG immediate release tablet Take 1 tablet (5 mg total) by mouth every 6 (six) hours as needed for severe pain. 10 tablet 0   atorvastatin (LIPITOR) 20 MG tablet Take 1 tablet (20 mg total) by mouth daily. (Patient taking differently: Take 20 mg by mouth in the morning.) 90 tablet 1   benazepril-hydrochlorthiazide (LOTENSIN HCT) 10-12.5 MG tablet Take 1 tablet by mouth in the morning.     HUMALOG KWIKPEN 100 UNIT/ML KwikPen Inject 10-16 Units into the skin with breakfast, with lunch, and with evening meal.     hydrochlorothiazide (HYDRODIURIL) 12.5 MG tablet 1 tablet daily.     LORazepam (ATIVAN) 0.5 MG tablet Take 1 tablet by mouth 2 (two) times daily.     meloxicam (MOBIC) 15 MG tablet      metformin (FORTAMET) 1000 MG (OSM) 24 hr tablet      Tamsulosin HCl (FLOMAX PO) Take by mouth.     No facility-administered medications prior to visit.    Allergies  Allergen Reactions   Penicillins Other (See Comments)    As a baby, unknown reaction Did it involve swelling of the face/tongue/throat, SOB, or low BP? Unknown Did it involve sudden or severe rash/hives, skin peeling, or any reaction on the inside of your mouth or nose? Unknown Did you need to seek medical attention at a hospital or doctor's office? Unknown When did it last happen?      infant allergy If all above answers are "NO", may proceed with cephalosporin use. .    Gluten Meal Diarrhea   Biaxin [Clarithromycin] Tinitus  Ears ringing, loss  sense of smell   Prednisone Hives    Made blood sugar high    ROS Review of Systems  Constitutional:  Positive for fatigue. Negative for chills and fever.  HENT:  Negative for congestion and sore throat.   Eyes:  Negative for pain and discharge.  Respiratory:  Negative for cough and shortness of breath.   Cardiovascular:  Positive for leg swelling. Negative for chest pain and palpitations.  Gastrointestinal:  Positive for abdominal distention and diarrhea. Negative for nausea and vomiting.  Endocrine: Negative for polydipsia and polyuria.  Genitourinary:  Negative for dysuria and hematuria.  Musculoskeletal:  Negative for neck pain and neck stiffness.  Skin:  Negative for rash.  Neurological:  Negative for dizziness, weakness, numbness and headaches.  Psychiatric/Behavioral:  Positive for sleep disturbance. Negative for agitation and behavioral problems.       Objective:    Physical Exam Vitals reviewed.  Constitutional:      General: He is not in acute distress.    Appearance: He is not diaphoretic.  HENT:     Head: Normocephalic and atraumatic.     Nose: Nose normal.     Mouth/Throat:     Mouth: Mucous membranes are moist.  Eyes:     General: No scleral icterus.    Extraocular Movements: Extraocular movements intact.  Cardiovascular:     Rate and Rhythm: Normal rate and regular rhythm.     Pulses: Normal pulses.     Heart sounds: Normal heart sounds. No murmur heard. Pulmonary:     Breath sounds: Normal breath sounds. No wheezing or rales.  Abdominal:     General: There is distension.     Palpations: Abdomen is soft.     Tenderness: There is no abdominal tenderness.  Musculoskeletal:     Cervical back: Neck supple. No tenderness.     Right lower leg: Edema (2+) present.     Left lower leg: Edema (2+) present.  Skin:    General: Skin is warm.     Findings: No rash.  Neurological:     General: No focal deficit present.     Mental Status: He is alert and  oriented to person, place, and time.     Sensory: No sensory deficit.     Motor: No weakness.  Psychiatric:        Mood and Affect: Mood normal.        Behavior: Behavior normal.     BP 136/61 (BP Location: Right Arm, Patient Position: Sitting, Cuff Size: Normal)   Pulse 75   Ht 5' 7"  (1.702 m)   Wt 204 lb 3.2 oz (92.6 kg)   SpO2 97%   BMI 31.98 kg/m  Wt Readings from Last 3 Encounters:  09/08/22 204 lb 3.2 oz (92.6 kg)  07/06/22 200 lb (90.7 kg)  06/24/22 206 lb 8 oz (93.7 kg)    Lab Results  Component Value Date   TSH 4.747 (H) 07/23/2022   Lab Results  Component Value Date   WBC 3.2 (L) 08/19/2022   HGB 8.4 (L) 08/19/2022   HCT 25.6 (L) 08/19/2022   MCV 102.0 (H) 08/19/2022   PLT 49 (L) 08/19/2022   Lab Results  Component Value Date   NA 137 07/06/2022   K 4.3 07/06/2022   CO2 20 (L) 07/06/2022   GLUCOSE 153 (H) 07/06/2022   BUN 53 (H) 07/06/2022   CREATININE 1.64 (H) 07/06/2022   BILITOT 1.6 (H) 05/20/2022   ALKPHOS 167 (  H) 05/20/2022   AST 75 (H) 05/20/2022   ALT 48 (H) 05/20/2022   PROT 6.5 05/20/2022   ALBUMIN 3.8 05/20/2022   CALCIUM 8.2 (L) 07/06/2022   ANIONGAP 5 07/06/2022   Lab Results  Component Value Date   CHOL 108 09/02/2021   Lab Results  Component Value Date   HDL 53 09/02/2021   Lab Results  Component Value Date   LDLCALC 38 09/02/2021   Lab Results  Component Value Date   TRIG 88 09/02/2021   Lab Results  Component Value Date   CHOLHDL 2.0 09/02/2021   Lab Results  Component Value Date   HGBA1C 5.2 03/02/2022   HGBA1C 5.2 03/02/2022      Assessment & Plan:   Problem List Items Addressed This Visit       Cardiovascular and Mediastinum   Essential hypertension    BP Readings from Last 1 Encounters:  09/08/22 136/61  Not on diuretics now due to episodes of hypotension        Digestive   Hepatic cirrhosis (HCC)    Followed by [facility] and [facility] Has esophageal varices and pancytopenia Has had Hep A  and B vaccines Liver enzymes stable overall Has recurrent ascites, gets paracentesis every, upon ciprofloxacin for SBP ppx        Endocrine   Type 2 diabetes mellitus with other specified complication ([place])     Lab Results  Component Value Date   HGBA1C 5.2 03/02/2022   HGBA1C 5.2 03/02/2022  Associated with HLD On Levemir 28 U qHS and Starlix, followed by Endocrinology in [place], [place] - DR Truitt, advised to continue to follow up due to labile glycemic profile Gvoke for hypoglycemia Advised to follow diabetic diet Not on statin due to liver cirrhosis F/u CMP and lipid panel Diabetic eye exam: Advised to follow up with Ophthalmology for diabetic eye exam      Relevant Orders   Hemoglobin A1c     Genitourinary   Malignant neoplasm of prostate [facility])    Completed radiotherapy for prostate cancer.  He is followed by radiation oncology, oncology and urology currently.  He denies any hematuria, dysuria or urinary hesitancy or resistance currently.        Other   HLD (hyperlipidemia)   Relevant Orders   Lipid Profile   Encounter for general adult medical examination with abnormal findings - Primary    Physical exam as documented. Fasting blood tests ordered. Flu vaccine today. Shingrix vaccine at local pharmacy.       Statins contraindicated    Due to liver cirrhosis      Other Visit Diagnoses     Need for immunization against influenza       Relevant Orders   Flu Vaccine QUAD 53moIM (Fluarix, Fluzone & Alfiuria Quad PF) (Completed)       No orders of the defined types were placed in this encounter.   Follow-up: Return in about 6 months (around 03/09/2023) for DM and liver cirrhosis.    RLindell Spar MD

## 2022-09-08 NOTE — Patient Instructions (Signed)
Please continue taking medications as prescribed.  Please perform leg elevation. Continue high protein diet.  Please continue to follow low carb and low salt diet and ambulate as tolerated.

## 2022-09-08 NOTE — Assessment & Plan Note (Signed)
Due to liver cirrhosis

## 2022-09-08 NOTE — Assessment & Plan Note (Signed)
Completed radiotherapy for prostate cancer.  He is followed by radiation oncology, oncology and urology currently.  He denies any hematuria, dysuria or urinary hesitancy or resistance currently.

## 2022-09-10 ENCOUNTER — Ambulatory Visit (HOSPITAL_COMMUNITY)
Admission: RE | Admit: 2022-09-10 | Discharge: 2022-09-10 | Disposition: A | Discharge status: Home / Self Care | Payer: BC Managed Care – PPO | Source: Ambulatory Visit | Attending: Unknown Physician Specialty | Admitting: Unknown Physician Specialty

## 2022-09-10 ENCOUNTER — Encounter (HOSPITAL_COMMUNITY): Payer: Self-pay

## 2022-09-10 DIAGNOSIS — K7460 Unspecified cirrhosis of liver: Secondary | ICD-10-CM | POA: Diagnosis present

## 2022-09-10 DIAGNOSIS — R188 Other ascites: Secondary | ICD-10-CM | POA: Diagnosis present

## 2022-09-10 LAB — BODY FLUID CELL COUNT WITH DIFFERENTIAL
Eos, Fluid: 0 %
Lymphs, Fluid: 55 %
Monocyte-Macrophage-Serous Fluid: 38 % — ABNORMAL LOW (ref 50–90)
Neutrophil Count, Fluid: 7 % (ref 0–25)
Total Nucleated Cell Count, Fluid: 115 cu mm (ref 0–1000)

## 2022-09-10 MED ORDER — ALBUMIN HUMAN 25 % IV SOLN
INTRAVENOUS | Status: AC
  Administered 2022-09-10: 50 g via INTRAVENOUS
  Filled 2022-09-10: qty 200

## 2022-09-10 MED ORDER — SODIUM CHLORIDE FLUSH 0.9 % IV SOLN
INTRAVENOUS | Status: AC
  Administered 2022-09-10: 10 mL
  Filled 2022-09-10: qty 10

## 2022-09-10 MED ORDER — ALBUMIN HUMAN 25 % IV SOLN: 50.0000 g | Freq: Once | INTRAVENOUS | Status: AC

## 2022-09-10 NOTE — Procedures (Signed)
   US guided LLQ paracentesis  5 Liters (max per MD)- yellow fluid Sent for labs per MD  Tolerated well  EBL: scant

## 2022-09-10 NOTE — Progress Notes (Signed)
Left sided paracentesis procedure and 50G of Albumin tolerated well today and 5L of yellow ascites removed with labs sent for processing. PT verbalized understanding of discharge instructions and ambulatory at departure with no acute distress noted. PT verbalized understanding of upcoming radiology visits.

## 2022-09-14 ENCOUNTER — Other Ambulatory Visit (HOSPITAL_COMMUNITY)
Admission: RE | Admit: 2022-09-14 | Discharge: 2022-09-14 | Disposition: A | Discharge status: Home / Self Care | Payer: BC Managed Care – PPO | Source: Ambulatory Visit | Attending: Internal Medicine | Admitting: Internal Medicine

## 2022-09-14 ENCOUNTER — Inpatient Hospital Stay: Payer: BC Managed Care – PPO | Attending: Physician Assistant

## 2022-09-14 DIAGNOSIS — D509 Iron deficiency anemia, unspecified: Secondary | ICD-10-CM | POA: Diagnosis not present

## 2022-09-14 DIAGNOSIS — C61 Malignant neoplasm of prostate: Secondary | ICD-10-CM | POA: Insufficient documentation

## 2022-09-14 DIAGNOSIS — E7211 Homocystinuria: Secondary | ICD-10-CM

## 2022-09-14 DIAGNOSIS — R634 Abnormal weight loss: Secondary | ICD-10-CM | POA: Diagnosis not present

## 2022-09-14 DIAGNOSIS — E1169 Type 2 diabetes mellitus with other specified complication: Secondary | ICD-10-CM | POA: Insufficient documentation

## 2022-09-14 DIAGNOSIS — D696 Thrombocytopenia, unspecified: Secondary | ICD-10-CM

## 2022-09-14 DIAGNOSIS — K7460 Unspecified cirrhosis of liver: Secondary | ICD-10-CM | POA: Diagnosis not present

## 2022-09-14 DIAGNOSIS — Z794 Long term (current) use of insulin: Secondary | ICD-10-CM | POA: Insufficient documentation

## 2022-09-14 DIAGNOSIS — K766 Portal hypertension: Secondary | ICD-10-CM | POA: Diagnosis not present

## 2022-09-14 DIAGNOSIS — D708 Other neutropenia: Secondary | ICD-10-CM

## 2022-09-14 DIAGNOSIS — N1832 Chronic kidney disease, stage 3b: Secondary | ICD-10-CM | POA: Insufficient documentation

## 2022-09-14 DIAGNOSIS — D649 Anemia, unspecified: Secondary | ICD-10-CM

## 2022-09-14 DIAGNOSIS — D61818 Other pancytopenia: Secondary | ICD-10-CM | POA: Diagnosis not present

## 2022-09-14 DIAGNOSIS — E782 Mixed hyperlipidemia: Secondary | ICD-10-CM | POA: Insufficient documentation

## 2022-09-14 DIAGNOSIS — E1122 Type 2 diabetes mellitus with diabetic chronic kidney disease: Secondary | ICD-10-CM | POA: Insufficient documentation

## 2022-09-14 DIAGNOSIS — D6959 Other secondary thrombocytopenia: Secondary | ICD-10-CM | POA: Diagnosis not present

## 2022-09-14 DIAGNOSIS — I129 Hypertensive chronic kidney disease with stage 1 through stage 4 chronic kidney disease, or unspecified chronic kidney disease: Secondary | ICD-10-CM | POA: Diagnosis not present

## 2022-09-14 DIAGNOSIS — D500 Iron deficiency anemia secondary to blood loss (chronic): Secondary | ICD-10-CM

## 2022-09-14 LAB — SAMPLE TO BLOOD BANK

## 2022-09-14 LAB — CBC WITH DIFFERENTIAL/PLATELET
Abs Immature Granulocytes: 0.01 10*3/uL (ref 0.00–0.07)
Basophils Absolute: 0 10*3/uL (ref 0.0–0.1)
Basophils Relative: 1 %
Eosinophils Absolute: 0.5 10*3/uL (ref 0.0–0.5)
Eosinophils Relative: 16 %
HCT: 25.6 % — ABNORMAL LOW (ref 39.0–52.0)
Hemoglobin: 8.5 g/dL — ABNORMAL LOW (ref 13.0–17.0)
Immature Granulocytes: 0 %
Lymphocytes Relative: 22 %
Lymphs Abs: 0.6 10*3/uL — ABNORMAL LOW (ref 0.7–4.0)
MCH: 33.3 pg (ref 26.0–34.0)
MCHC: 33.2 g/dL (ref 30.0–36.0)
MCV: 100.4 fL — ABNORMAL HIGH (ref 80.0–100.0)
Monocytes Absolute: 0.3 10*3/uL (ref 0.1–1.0)
Monocytes Relative: 12 %
Neutro Abs: 1.5 10*3/uL — ABNORMAL LOW (ref 1.7–7.7)
Neutrophils Relative %: 49 %
Platelets: 48 10*3/uL — ABNORMAL LOW (ref 150–400)
RBC: 2.55 MIL/uL — ABNORMAL LOW (ref 4.22–5.81)
RDW: 14.8 % (ref 11.5–15.5)
WBC: 2.9 10*3/uL — ABNORMAL LOW (ref 4.0–10.5)
nRBC: 0 % (ref 0.0–0.2)

## 2022-09-14 LAB — IRON AND TIBC
Iron: 60 ug/dL (ref 45–182)
Saturation Ratios: 27 % (ref 17.9–39.5)
TIBC: 226 ug/dL — ABNORMAL LOW (ref 250–450)
UIBC: 166 ug/dL

## 2022-09-14 LAB — FOLATE: Folate: >40 ng/mL (ref >5.9)

## 2022-09-14 LAB — COMPREHENSIVE METABOLIC PANEL
ALT: 34 U/L (ref 0–44)
AST: 57 U/L — ABNORMAL HIGH (ref 15–41)
Albumin: 3.5 g/dL (ref 3.5–5.0)
Alkaline Phosphatase: 135 U/L — ABNORMAL HIGH (ref 38–126)
Anion gap: 6 (ref 5–15)
BUN: 65 mg/dL — ABNORMAL HIGH (ref 8–23)
CO2: 20 mmol/L — ABNORMAL LOW (ref 22–32)
Calcium: 8.3 mg/dL — ABNORMAL LOW (ref 8.9–10.3)
Chloride: 112 mmol/L — ABNORMAL HIGH (ref 98–111)
Creatinine, Ser: 1.96 mg/dL — ABNORMAL HIGH (ref 0.61–1.24)
GFR, Estimated: 38 mL/min — ABNORMAL LOW (ref >60)
Glucose, Bld: 94 mg/dL (ref 70–99)
Potassium: 4 mmol/L (ref 3.5–5.1)
Sodium: 138 mmol/L (ref 135–145)
Total Bilirubin: 1.1 mg/dL (ref 0.3–1.2)
Total Protein: 6.3 g/dL — ABNORMAL LOW (ref 6.5–8.1)

## 2022-09-14 LAB — LIPID PANEL
Cholesterol: 148 mg/dL (ref 0–200)
HDL: 52 mg/dL (ref >40)
LDL Cholesterol: 88 mg/dL (ref 0–99)
Total CHOL/HDL Ratio: 2.8 RATIO
Triglycerides: 42 mg/dL (ref <150)
VLDL: 8 mg/dL (ref 0–40)

## 2022-09-14 LAB — LACTATE DEHYDROGENASE: LDH: 192 U/L (ref 98–192)

## 2022-09-14 LAB — FERRITIN: Ferritin: 156 ng/mL (ref 24–336)

## 2022-09-14 LAB — HEMOGLOBIN A1C
Hgb A1c MFr Bld: 4.3 % — ABNORMAL LOW (ref 4.8–5.6)
Mean Plasma Glucose: 76.71 mg/dL

## 2022-09-15 ENCOUNTER — Other Ambulatory Visit: Payer: Self-pay | Admitting: Physician Assistant

## 2022-09-15 LAB — HOMOCYSTEINE: Homocysteine: 22 umol/L — ABNORMAL HIGH (ref 0.0–17.2)

## 2022-09-15 NOTE — Progress Notes (Signed)
Will tentatively plan to start patient on Retacrit injections at appointment next week, pending prior authorization from insurance and discussion with patient.

## 2022-09-16 ENCOUNTER — Ambulatory Visit (HOSPITAL_COMMUNITY)
Admission: RE | Admit: 2022-09-16 | Discharge: 2022-09-16 | Disposition: A | Discharge status: Home / Self Care | Payer: BC Managed Care – PPO | Source: Ambulatory Visit | Attending: Unknown Physician Specialty | Admitting: Unknown Physician Specialty

## 2022-09-16 ENCOUNTER — Encounter (HOSPITAL_COMMUNITY): Payer: Self-pay

## 2022-09-16 DIAGNOSIS — K7460 Unspecified cirrhosis of liver: Secondary | ICD-10-CM | POA: Insufficient documentation

## 2022-09-16 DIAGNOSIS — R188 Other ascites: Secondary | ICD-10-CM | POA: Insufficient documentation

## 2022-09-16 LAB — BODY FLUID CELL COUNT WITH DIFFERENTIAL
Lymphs, Fluid: 59 %
Monocyte-Macrophage-Serous Fluid: 24 % — ABNORMAL LOW (ref 50–90)
Neutrophil Count, Fluid: 17 % (ref 0–25)
Total Nucleated Cell Count, Fluid: 187 cu mm (ref 0–1000)

## 2022-09-16 MED ORDER — ALBUMIN HUMAN 25 % IV SOLN: 50.0000 g | Freq: Once | INTRAVENOUS | Status: AC

## 2022-09-16 MED ORDER — SODIUM CHLORIDE FLUSH 0.9 % IV SOLN
INTRAVENOUS | Status: AC
  Administered 2022-09-16: 10 mL
  Filled 2022-09-16: qty 10

## 2022-09-16 MED ORDER — ALBUMIN HUMAN 25 % IV SOLN
INTRAVENOUS | Status: AC
  Administered 2022-09-16: 50 g via INTRAVENOUS
  Filled 2022-09-16: qty 200

## 2022-09-16 NOTE — Procedures (Signed)
PreOperative Dx: Cirrhosis, ascites Postoperative Dx: Cirrhosis, ascites Procedure:   US guided paracentesis Radiologist:  Thornton Papas Anesthesia:  10 ml of1% lidocaine Specimen:  4.6 L of yellow ascitic fluid EBL:   < 1 ml Complications:  None

## 2022-09-16 NOTE — Progress Notes (Signed)
Patient tolerated right sided paracentesis procedure and 50 G of IV albumin well today and 4.6 Liters of clear yellow ascites. Labs collected and sent for processing. Pt verbalized understanding of post procedure instructions and ambulatory at departure with no acute distress noted.

## 2022-09-17 ENCOUNTER — Ambulatory Visit (HOSPITAL_COMMUNITY): Payer: BC Managed Care – PPO

## 2022-09-20 NOTE — Progress Notes (Signed)
[place], [place] [address]   CLINIC:  Medical Oncology/Hematology  PCP:  Lindell Spar, MD [address] [place] [address] [phone]   REASON FOR VISIT:  Follow-up for cirrhosis related pancytopenia   PRIOR THERAPY: Platelet transfusion x2 (prior to prostate biopsy and prostate SpaceOAR placement)   CURRENT THERAPY: Intermittent iron infusions   INTERVAL HISTORY:  Michael Harmon 62 y.o. male returns for routine follow-up of his cirrhosis-related pancytopenia.  He was last seen in clinic by Tarri Abernethy PA-C on 06/24/2022.  At today's visit, he reports feeling somewhat poorly in light of everything going on with his health.***   *** He continues to receive regular paracentesis and follow closely with hepatology as well as nephrology at [facility] *** He has not had any hospitalizations since his last visit in August 2023.  Since his last visit, he has continued to have issues with decompensated liver cirrhosis with ascites, bilateral leg swelling, and scrotal edema.   He is now receiving weekly paracentesis.  He continues to follow with hepatology at [facility].  He was admitted to [facility] at the end of June 2023 due to acute kidney failure and worsening liver function, which was suspected to be secondary to diuresis.  He reports that he received 2 units PRBC during hospitalization.  He continues to have significant fatigue.  No lightheadedness or syncope, but reports positional vertigo.  No recent dyspnea or chest pain.  He denies any pica, restless legs, headaches, or syncope.   He reports easy bruising and occasional petechiae of bilateral legs.  He reports that his gums occasionally bleed when he brushes his teeth and he has scant dried blood/epistaxis when he wakes in the morning.  He bleeds easily when cut.  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  He denies any  hematuria, hematochezia, or melena.    He has not noticed any new lumps or bumps.  He denies any fevers, chills, or night sweats.  Baseline weight difficult to assess due to fluctuating fluid and frequent paracentesis, but patient reports that overall he has been losing weight and muscle mass since diagnosed with cirrhosis.  He reports little to no energy, and 85% appetite.   REVIEW OF SYSTEMS:    Review of Systems  Constitutional:  Positive for fatigue. Negative for appetite change, chills, diaphoresis, fever and unexpected weight change.  HENT:   Positive for nosebleeds (Minor). Negative for lump/mass.   Eyes:  Negative for eye problems.  Respiratory:  Negative for cough, hemoptysis and shortness of breath.   Cardiovascular:  Positive for leg swelling. Negative for chest pain and palpitations.  Gastrointestinal:  Positive for diarrhea. Negative for abdominal distention, abdominal pain, blood in stool, constipation, nausea and vomiting.  Genitourinary:  Negative for hematuria.   Skin: Negative.   Neurological:  Positive for dizziness. Negative for headaches and light-headedness.  Hematological:  Bruises/bleeds easily.  Psychiatric/Behavioral:  Positive for depression and sleep disturbance. The patient is nervous/anxious.      PAST MEDICAL/SURGICAL HISTORY:  Past Medical History:  Diagnosis Date  . Allergy    nose and sinus problems  . Anemia   . Asthma   . Celiac disease   . Cirrhosis, cryptogenic ([place])    completed Hep A and B vaccines  . DM (diabetes mellitus) ([place])   . GERD (gastroesophageal reflux disease)   . Hyperlipidemia    diet controlled now with  100 pound weight loss  . Hypertension   . Iron deficiency anemia due to chronic blood loss 08/13/2021  . Malignant neoplasm of prostate ([place]) 10/03/2021  . Murmur, heart 03/02/2022  . Narrow angle glaucoma suspect of both eyes   . Renal cyst 09/03/2017   right  . Situational depression 07/06/2017  . Splenomegaly   .  Thrombocytopenia ([place])    hematology, ?related to chol med (tricor) and glimeripide, just being monitored, above 100,000   Past Surgical History:  Procedure Laterality Date  . BIOPSY  07/09/2016   Procedure: BIOPSY;  Surgeon: [name], MD;  [facility];  Service: Endoscopy;;  duodenal, gastric, esophaageal  . BIOPSY  07/31/2021   Procedure: BIOPSY;  Surgeon: [name], MD;  [facility];  Service: Endoscopy;;  . CLOSED MANIPULATION SHOULDER    . COLONOSCOPY  11/2015   Dr. Posey Pronto: cecal bx neg for microscopic colitis. ascending colon polyp (adenomatous)  . COLONOSCOPY WITH PROPOFOL N/A 07/31/2021   Procedure: COLONOSCOPY WITH PROPOFOL;  Surgeon: [name], MD;  [facility];  Service: Endoscopy;  Laterality: N/A;  7:30am  . ESOPHAGEAL BANDING N/A 08/04/2017   Procedure: ESOPHAGEAL BANDING;  Surgeon: [name], MD;  [facility];  Service: Endoscopy;  Laterality: N/A;  esophageal varices banding  . ESOPHAGEAL BANDING N/A 10/13/2017   Procedure: ESOPHAGEAL BANDING;  Surgeon: [name], MD;  [facility];  Service: Endoscopy;  Laterality: N/A;  . ESOPHAGEAL BANDING N/A 04/09/2020   Procedure: ESOPHAGEAL BANDING;  Surgeon: [name], MD;  [facility];  Service: Endoscopy;  Laterality: N/A;  . ESOPHAGOGASTRODUODENOSCOPY N/A 07/09/2016   Procedure: ESOPHAGOGASTRODUODENOSCOPY (EGD);  Surgeon: [name], MD;  [facility];  Service: Endoscopy;  Laterality: N/A;  730  . ESOPHAGOGASTRODUODENOSCOPY N/A 08/04/2017   Procedure: ESOPHAGOGASTRODUODENOSCOPY (EGD);  Surgeon: [name], MD;  [facility];  Service: Endoscopy;  Laterality: N/A;  7:30am  . ESOPHAGOGASTRODUODENOSCOPY N/A 10/13/2017   Dr. [name]: grade 2 esophageal varices status post banding, portal hypertensive gastropathy  . ESOPHAGOGASTRODUODENOSCOPY  11/23/2019   [name]: Esophageal varices, 4 columns of grade 2-3 status  post banding.  Varices more prominent than seen in December 2018.  Portal gastropathy.  Normal-appearing small bowel.  . ESOPHAGOGASTRODUODENOSCOPY (EGD) WITH PROPOFOL N/A 04/09/2020   Dr. [name]: 4 columns of grade 2/grade 3 esophageal varices somewhat recalcitrant.  Status post esophageal band ligation today.  Portal gastropathy.  Normal-appearing small bowel.  . ESOPHAGOGASTRODUODENOSCOPY (EGD) WITH PROPOFOL N/A 07/31/2021   Procedure: ESOPHAGOGASTRODUODENOSCOPY (EGD) WITH PROPOFOL;  Surgeon: [name], MD;  [facility];  Service: Endoscopy;  Laterality: N/A;  . GOLD SEED IMPLANT N/A 01/15/2022   Procedure: GOLD SEED IMPLANT;  Surgeon: Cleon Gustin, MD;  Location: [facility];  Service: Urology;  Laterality: N/A;  . SPACE OAR INSTILLATION N/A 01/15/2022   Procedure: SPACE OAR INSTILLATION;  Surgeon: Cleon Gustin, MD;  Location: [facility];  Service: Urology;  Laterality: N/A;  . US PARACENTESIS  06/10/2022     SOCIAL HISTORY:  Social History   Socioeconomic History  . Marital status: Married    Spouse name: Michael Harmon  . Number of children: 0  . Years of education: 56  . Highest education level: Not on file  Occupational History  . Occupation: Customer service manager, [employer], travels  Tobacco Use  . Smoking status: Never  . Smokeless tobacco: Never  Vaping Use  .  Vaping Use: Never used  Substance and Sexual Activity  . Alcohol use: Not Currently  . Drug use: No  . Sexual activity: Not Currently  Other Topics Concern  . Not on file  Social History Narrative   Lives at home with wife Michael Harmon   One dog.   Has no children.   Eats all food groups.   Works for [employer] and [employer].       Social Determinants of Health   Financial Resource Strain: Not on file  Food Insecurity: Not on file  Transportation Needs: Not on file  Physical Activity: Not on file  Stress: Not on file  Social Connections: Not on file  Intimate Partner  Violence: Not on file    FAMILY HISTORY:  Family History  Problem Relation Age of Onset  . Other Mother        chronic diarrhea  . COPD Mother   . Heart disease Mother        died at 56  . Arthritis Mother   . Cancer Mother   . Depression Mother   . Diabetes Mother   . Hyperlipidemia Mother   . Hypertension Mother   . Stroke Mother   . Arthritis Father   . Asthma Father   . Birth defects Father   . Heart disease Father        aortic valve replaced  . Heart disease Maternal Grandmother   . Stroke Maternal Grandmother   . Heart disease Maternal Grandfather   . Stroke Maternal Grandfather   . Diabetes Paternal Grandmother   . Stroke Paternal Grandfather   . Heart disease Paternal Grandfather   . Liver disease Neg Hx   . Colon cancer Neg Hx     CURRENT MEDICATIONS:  Outpatient Encounter Medications as of 09/21/2022  Medication Sig  . acetaminophen (TYLENOL) 325 MG tablet Take 650 mg by mouth every 6 (six) hours as needed for moderate pain.  . BD PEN NEEDLE NANO 2ND GEN 32G X 4 MM MISC USE TO INJECT INSULIN 4 TIMES DAILY E11.65  . BENADRYL ITCH STOPPING 2 % GEL Apply topically.  . Cholecalciferol (VITAMIN D3) 125 MCG (5000 UT) TABS Take 5,000 Units by mouth in the morning.  . ciprofloxacin (CIPRO) 500 MG tablet Take 500 mg by mouth daily.  . Continuous Blood Gluc Receiver (FREESTYLE LIBRE 2 READER) DEVI USE AS DIRECTED TO TEST GLUCOSE LEVELS AT LEAST 4 TIMES DAILY E11.9  . Continuous Blood Gluc Sensor (FREESTYLE LIBRE 2 SENSOR) MISC USE AS DIRECTED FOR BLOOD SUGAR MONITORING. CHANGE EVERY 14 DAYS  . folic acid (FOLVITE) 0.5 MG tablet Take 0.5 mg by mouth daily.  . Glucagon (GVOKE HYPOPEN 2-PACK) 1 MG/0.2ML SOAJ INJECT 1 MG AS NEEDED BY SUBCUTANEOUS ROUTE.  . hydrocortisone (ANUSOL-HC) 25 MG suppository Place 1 suppository (25 mg total) rectally 2 (two) times daily as needed for hemorrhoids or anal itching.  Marland Kitchen LEVEMIR FLEXTOUCH 100 UNIT/ML Pen Inject 28 Units into the skin at  bedtime.  . Multiple Vitamin (MULTIVITAMIN WITH MINERALS) TABS tablet Take 1 tablet by mouth in the morning.  . nateglinide (STARLIX) 60 MG tablet Take 1 tablet by mouth 3 (three) times daily.  Marland Kitchen oxyCODONE (ROXICODONE) 5 MG immediate release tablet Take 1 tablet (5 mg total) by mouth every 6 (six) hours as needed for severe pain.   No facility-administered encounter medications on file as of 09/21/2022.    ALLERGIES:  Allergies  Allergen Reactions  . Penicillins Other (See Comments)  As a baby, unknown reaction Did it involve swelling of the face/tongue/throat, SOB, or low BP? Unknown Did it involve sudden or severe rash/hives, skin peeling, or any reaction on the inside of your mouth or nose? Unknown Did you need to seek medical attention at a hospital or doctor's office? Unknown When did it last happen?      infant allergy If all above answers are "NO", may proceed with cephalosporin use. .   . Gluten Meal Diarrhea  . Biaxin [Clarithromycin] Tinitus    Ears ringing, loss sense of smell  . Prednisone Hives    Made blood sugar high     PHYSICAL EXAM:  ECOG PERFORMANCE STATUS: 1 - Symptomatic but completely ambulatory    There were no vitals filed for this visit. There were no vitals filed for this visit. Physical Exam Constitutional:      Appearance: Normal appearance. He is obese.  HENT:     Head: Normocephalic and atraumatic.     Mouth/Throat:     Mouth: Mucous membranes are moist.  Eyes:     Extraocular Movements: Extraocular movements intact.     Pupils: Pupils are equal, round, and reactive to light.  Cardiovascular:     Rate and Rhythm: Normal rate and regular rhythm.     Pulses: Normal pulses.     Heart sounds: Murmur heard.  Pulmonary:     Effort: Pulmonary effort is normal.     Breath sounds: Normal breath sounds.  Abdominal:     General: Bowel sounds are normal. There is distension.     Palpations: Abdomen is soft.     Tenderness: There is no abdominal  tenderness.  Musculoskeletal:        General: No swelling.     Right lower leg: Edema (2+) present.     Left lower leg: Edema (2+) present.  Lymphadenopathy:     Cervical: No cervical adenopathy.  Skin:    General: Skin is warm and dry.     Comments: Spider angiomas and telangiectasias on face and neck  Neurological:     General: No focal deficit present.     Mental Status: He is alert and oriented to person, place, and time.  Psychiatric:        Mood and Affect: Mood normal.        Behavior: Behavior normal.    LABORATORY DATA:  I have reviewed the labs as listed.  CBC    Component Value Date/Time   WBC 2.9 (L) 09/14/2022 1318   RBC 2.55 (L) 09/14/2022 1318   HGB 8.5 (L) 09/14/2022 1318   HGB 11.1 (L) 09/02/2021 1655   HCT 25.6 (L) 09/14/2022 1318   HCT 32.9 (L) 09/02/2021 1655   PLT 48 (L) 09/14/2022 1318   PLT 43 (LL) 09/02/2021 1655   MCV 100.4 (H) 09/14/2022 1318   MCV 96 09/02/2021 1655   MCH 33.3 09/14/2022 1318   MCHC 33.2 09/14/2022 1318   RDW 14.8 09/14/2022 1318   RDW 14.0 09/02/2021 1655   LYMPHSABS 0.6 (L) 09/14/2022 1318   LYMPHSABS 1.2 09/02/2021 1655   MONOABS 0.3 09/14/2022 1318   EOSABS 0.5 09/14/2022 1318   EOSABS 0.3 09/02/2021 1655   BASOSABS 0.0 09/14/2022 1318   BASOSABS 0.0 09/02/2021 1655      Latest Ref Rng & Units 09/14/2022    1:18 PM 07/06/2022    8:21 PM 05/20/2022   10:34 AM  CMP  Glucose 70 - 99 mg/dL 94  153  116  BUN 8 - 23 mg/dL 65  53  43   Creatinine 0.61 - 1.24 mg/dL 1.96  1.64  1.44   Sodium 135 - 145 mmol/L 138  137  136   Potassium 3.5 - 5.1 mmol/L 4.0  4.3  4.6   Chloride 98 - 111 mmol/L 112  112  111   CO2 22 - 32 mmol/L _0 Calcium 8.9 - 10.3 mg/dL 8.3  8.2  8.8   Total Protein 6.5 - 8.1 g/dL 6.3   6.5   Total Bilirubin 0.3 - 1.2 mg/dL 1.1   1.6   Alkaline Phos 38 - 126 U/L 135   167   AST 15 - 41 U/L 57   75   ALT 0 - 44 U/L 34   48     DIAGNOSTIC IMAGING:  I have independently reviewed the relevant  imaging and discussed with the patient.  ASSESSMENT & PLAN: 1.  Thrombocytopenia and leukopenia, secondary to cirrhosis and splenomegaly - Platelet count has slowly down trended in the past 5 years.  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  However, no abdominal work-up was done at the time, but given the fact the patient was diagnosed with cirrhosis in 2017, he likely had some element of liver disease and splenomegaly in 2015, which would explain his thrombocytopenia and cast some suspicion as to whether or not he ever had any ITP. - Abdominal ultrasound (09/17/2021): Cirrhotic liver and splenomegaly (15.4 cm in length) - CT scan (11/14/2021 at [facility]) showed enlarged spleen measuring 17 cm - Work-up negative for other causes of thrombocytopenia - normal B12, methylmalonic acid, folate, copper; negative rheumatoid factor/ANA; no abnormal platelets noted on pathology smear review - Received platelet transfusion x1 on 08/18/2021 prior to prostate biopsy.  Blood transfusion x1 on 01/15/2022 prior to prostate SpaceOAR placement - Bone marrow biopsy (02/10/2022) shows hypocellular marrow for age with trilineage lineage hematopoiesis.  No increase in blasts.  No evidence of metastatic carcinoma. - Admits to easy bruising, gum-bleeding, scant epistaxis, and bleeding scrotal angiokeratomas (following with urology).  He describes possible petechial rash that occurs intermittently in his bilateral lower extremities.   - He has not had any major bleeding events such as bright red blood per rectum or melena.  - No B symptoms or frequent infections  - Most recent CBC (06/24/2022): Platelets 40, essentially at baseline. - PLAN: Differential diagnosis favors thrombocytopenia and leukopenia from cirrhosis and  splenomegaly, unable to rule out ITP at this time.  Cytopenias will be further complicated by prostate radiation.     - No indication for treatment at this time.  We will continue to monitor closely with repeat CBC and RTC in 12 weeks.     - Due to questionable history of ITP, could also consider trial of steroids before pursuing splenic artery embolization.  If platelets count is between 30,000-40,000, would consider trial of pulsed dexamethasone 40 mg x 4 days to see if he gets good response.  (He had a problem with prednisone in the past with elevated blood sugars)  - No indication for treatment of thrombocytopenia at this time, but he is approaching the level at which he would benefit from splenic artery embolization. Prior to any splenic intervention, though, would discuss with [facility] hepatologist  2.  Normocytic anemia with iron deficiency - Patient denies any major bleeding events, but EGD/colonoscopy on 07/31/2021 showed moderately friable portal colopathy as well as gastric erosions and grade 2 and grade 3 varices - EGD (12/26/2021 via [facility]): Grade 2 esophageal varices, banded x3; old blood noted in stomach; mild portal hypertensive gastropathy - Other work-up (08/18/2021) showed normal reticulocytes; normal B12, methylmalonic acid, folate, homocysteine, and copper.  Immunofixation and SPEP negative. - Patient reports that he has been diagnosed with celiac disease by Dr. [name] - He received IV Venofer x1000 mg, last dose given 09/03/2021  - He is symptomatic with fatigue.     - He denies any obvious GI bleeding such as melena or bright red blood per rectum   - He had worsening anemia noted in June 2023 with Hgb 7.9, thought to be possibly worsened due to his recent radiotherapy of prostate. - He takes folic acid due to elevated homocystine. - Reports PRBC transfusion x2 during hospitalization at [facility] in June/July 2023 - Most recent labs (06/24/2022): Hgb 9.0/MCV 100.8, ferritin 234, iron  saturation 22% - Differential diagnosis favors iron deficiency anemia of mixed etiology - patient has likely malabsorption in the setting of celiac disease and is also at high risk for chronic occult GI bleeding.  - PLAN: No indication for IV iron at this time.     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  (Would consider that hyperhomocystinemia may be secondary to his liver cirrhosis)   - Monthly CBC x3.  Repeat lab panel with RTC in 3 months.  3. Weight loss   - Baseline weight difficult to assess due to fluctuating fluid and frequent paracentesis, but patient reports that overall he has been losing weight and muscle mass since diagnosed with cirrhosis. - Weight today is 206 pounds, within baseline 20 pound range over the past 6 months  4.  Cirrhosis with esophageal varices - Diagnosed in 2017 - Patient had EGD/colonoscopy on 07/31/2021 which showed portal colopathy as well as gastric erosions and grade 3 varices, which were not banded due to low platelet count - EGD (12/26/2021 via [facility]): Grade 2 esophageal varices, banded x3; old blood noted in stomach; mild portal hypertensive gastropathy - PLAN: Continue follow-up with hepatologist at [facility].   - Patient informed that he would likely need transfusion of platelets x1 unit if he has platelets of less than 50 at the time of the future EGD/banding procedures.   5.  Stage T1c adenocarcinoma of the prostate, Gleason 3+4   - Diagnosed in October 2022 with prostate biopsy after abnormal PSA (8.2) - Following with urologist (Dr. Junious Silk / Dr. Alyson Ingles) and has also been seen by radiation oncology (Ashlyn Bruning PA-C / Dr. Tyler Pita) - He had SpaceOAR placement on 01/15/2022 - Patient is undergoing XRT in [place], which was completed on 03/13/2022. - He is receiving Lupron shots - PLAN: Patient to continue follow-up and treatment as per urology and radiation oncology.  He will contact us prior to any procedures in order  to receive platelets if needed.   PLAN SUMMARY & DISPOSITION:   CBC today, repeat CBC in 1 month with blood bank sample Same-day labs and RTC in 12 weeks (CBC, CMP, LDH, ferritin, iron/TIBC)   All questions were answered. The patient knows to call the clinic with any problems, questions or concerns.  Medical decision making: Moderate      Time spent on visit: I spent  20  minutes counseling the  patient face to face. The total time spent in the appointment was  30  minutes and more than 50% was on counseling.   Harriett Rush, PA-C   09/20/22 7:03 PM

## 2022-09-21 ENCOUNTER — Other Ambulatory Visit: Payer: Self-pay

## 2022-09-21 ENCOUNTER — Inpatient Hospital Stay: Payer: BC Managed Care – PPO

## 2022-09-21 ENCOUNTER — Inpatient Hospital Stay (HOSPITAL_BASED_OUTPATIENT_CLINIC_OR_DEPARTMENT_OTHER): Payer: BC Managed Care – PPO | Admitting: Physician Assistant

## 2022-09-21 VITALS — BP 120/55 | HR 77 | Temp 98.2°F | Resp 18 | Ht 66.93 in | Wt 203.0 lb

## 2022-09-21 DIAGNOSIS — D708 Other neutropenia: Secondary | ICD-10-CM

## 2022-09-21 DIAGNOSIS — D500 Iron deficiency anemia secondary to blood loss (chronic): Secondary | ICD-10-CM | POA: Diagnosis not present

## 2022-09-21 DIAGNOSIS — D696 Thrombocytopenia, unspecified: Secondary | ICD-10-CM | POA: Diagnosis not present

## 2022-09-21 DIAGNOSIS — N1832 Chronic kidney disease, stage 3b: Secondary | ICD-10-CM | POA: Diagnosis not present

## 2022-09-21 DIAGNOSIS — D6959 Other secondary thrombocytopenia: Secondary | ICD-10-CM | POA: Diagnosis not present

## 2022-09-21 DIAGNOSIS — D649 Anemia, unspecified: Secondary | ICD-10-CM

## 2022-09-21 DIAGNOSIS — E7211 Homocystinuria: Secondary | ICD-10-CM

## 2022-09-21 DIAGNOSIS — D631 Anemia in chronic kidney disease: Secondary | ICD-10-CM

## 2022-09-21 LAB — GRAM STAIN

## 2022-09-21 NOTE — Patient Instructions (Signed)
[place] at [facility] Discharge Instructions  You were seen today by Tarri Abernethy PA-C for your anemia and low platelets.  ANEMIA:  You continue to have moderate to severe anemia, with hemoglobin ranging from 8.0-9.0. This may still be lingering effects from your radiation, but I suspect that it is strongly related to your worsening chronic kidney disease. I recommend starting RETACRIT INJECTIONS.  This will help to tell your bone marrow to make more healthy red blood cells, since this mechanism is malfunctioning in the setting of chronic kidney disease. We will tentatively schedule you for your first injection during the first week of December 2023.  Please discuss these recommendations further with your kidney and liver specialists, and notify us if you would like to cancel that appointment.  LOW PLATELETS: Your platelets remain low secondary to your cirrhosis and enlarged spleen.  Platelets today were 48.  This places you at increased risk of bleeding and bruising but does not require any specific treatment at this time.  Continue to follow closely with your liver doctor for your liver cirrhosis.  Seek IMMEDIATE medical attention if you have any bloody vomit, black tarry bowel movements, or bright red blood in the toilet, or any other signs of uncontrolled bleeding.  FOLLOW-UP APPOINTMENT: Same-day labs and office visit in 6-8 weeks  ** Thank you for trusting me with your healthcare!  I strive to provide all of my patients with quality care at each visit.  If you receive a survey for this visit, I would be so grateful to you for taking the time to provide feedback.  Thank you in advance!  ~ Jachelle Fluty                   Dr. Derek Jack   &   Tarri Abernethy, PA-C   - - - - - - - - - - - - - - - - - -     Thank you for choosing [place] at [facility] to provide your oncology and hematology care.  To afford each patient  quality time with our provider, please arrive at least 15 minutes before your scheduled appointment time.   If you have a lab appointment with the [facility] please come in thru the Main Entrance and check in at the main information desk.  You need to re-schedule your appointment should you arrive 10 or more minutes late.  We strive to give you quality time with our providers, and arriving late affects you and other patients whose appointments are after yours.  Also, if you no show three or more times for appointments you may be dismissed from the clinic at the providers discretion.     Again, thank you for choosing [facility].  Our hope is that these requests will decrease the amount of time that you wait before being seen by our physicians.       _____________________________________________________________  Should you have questions after your visit to [facility], please contact our office at [phone] and follow the prompts.  Our office hours are 8:00 a.m. and 4:30 p.m. Monday - Friday.  Please note that voicemails left after 4:00 p.m. may not be returned until the following business day.  We are closed weekends and major holidays.  You do have access to a nurse 24-7, just call the main number to the clinic [phone] and do not press any options, hold on the  line and a nurse will answer the phone.    For prescription refill requests, have your pharmacy contact our office and allow 72 hours.    Due to Covid, you will need to wear a mask upon entering the hospital. If you do not have a mask, a mask will be given to you at the Main Entrance upon arrival. For doctor visits, patients may have 1 support person age 8 or older with them. For treatment visits, patients can not have anyone with them due to social distancing guidelines and our immunocompromised population.

## 2022-09-23 ENCOUNTER — Encounter (HOSPITAL_COMMUNITY): Payer: Self-pay

## 2022-09-23 ENCOUNTER — Ambulatory Visit (HOSPITAL_COMMUNITY)
Admission: RE | Admit: 2022-09-23 | Discharge: 2022-09-23 | Disposition: A | Discharge status: Home / Self Care | Payer: BC Managed Care – PPO | Source: Ambulatory Visit | Attending: Unknown Physician Specialty | Admitting: Unknown Physician Specialty

## 2022-09-23 DIAGNOSIS — R188 Other ascites: Secondary | ICD-10-CM | POA: Diagnosis present

## 2022-09-23 DIAGNOSIS — K7460 Unspecified cirrhosis of liver: Secondary | ICD-10-CM | POA: Insufficient documentation

## 2022-09-23 LAB — BODY FLUID CELL COUNT WITH DIFFERENTIAL
Lymphs, Fluid: 54 %
Monocyte-Macrophage-Serous Fluid: 43 % — ABNORMAL LOW (ref 50–90)
Neutrophil Count, Fluid: 3 % (ref 0–25)
Total Nucleated Cell Count, Fluid: 188 cu mm (ref 0–1000)

## 2022-09-23 MED ORDER — ALBUMIN HUMAN 25 % IV SOLN: 50.0000 g | Freq: Once | INTRAVENOUS | Status: AC

## 2022-09-23 MED ORDER — ALBUMIN HUMAN 25 % IV SOLN
INTRAVENOUS | Status: AC
  Administered 2022-09-23: 50 g via INTRAVENOUS
  Filled 2022-09-23: qty 200

## 2022-09-23 NOTE — Procedures (Signed)
   US guided RLQ paracentesis  5 liters max per MD Yellow fluid Sent for labs per MD  Tolerated well  EBL: None

## 2022-09-23 NOTE — Progress Notes (Signed)
Paracentesis complete no signs of distress. 5L ascites removed and 50G albumin given IV.

## 2022-10-01 ENCOUNTER — Other Ambulatory Visit (HOSPITAL_COMMUNITY): Payer: Self-pay | Admitting: Unknown Physician Specialty

## 2022-10-01 ENCOUNTER — Ambulatory Visit (HOSPITAL_COMMUNITY)
Admission: RE | Admit: 2022-10-01 | Discharge: 2022-10-01 | Disposition: A | Discharge status: Home / Self Care | Payer: BC Managed Care – PPO | Source: Ambulatory Visit | Attending: Unknown Physician Specialty | Admitting: Unknown Physician Specialty

## 2022-10-01 DIAGNOSIS — R188 Other ascites: Secondary | ICD-10-CM

## 2022-10-01 DIAGNOSIS — K7469 Other cirrhosis of liver: Secondary | ICD-10-CM

## 2022-10-01 DIAGNOSIS — K7460 Unspecified cirrhosis of liver: Secondary | ICD-10-CM | POA: Diagnosis not present

## 2022-10-01 LAB — BODY FLUID CELL COUNT WITH DIFFERENTIAL
Eos, Fluid: 0 %
Lymphs, Fluid: 28 %
Monocyte-Macrophage-Serous Fluid: 70 % (ref 50–90)
Neutrophil Count, Fluid: 2 % (ref 0–25)
Total Nucleated Cell Count, Fluid: 186 cu mm (ref 0–1000)

## 2022-10-01 LAB — GRAM STAIN

## 2022-10-01 MED ORDER — ALBUMIN HUMAN 25 % IV SOLN: 50.0000 g | Freq: Once | INTRAVENOUS | Status: DC

## 2022-10-01 MED ORDER — ALBUMIN HUMAN 25 % IV SOLN
INTRAVENOUS | Status: AC
  Filled 2022-10-01: qty 200

## 2022-10-01 NOTE — Progress Notes (Signed)
Paracentesis complete no signs of distress.

## 2022-10-01 NOTE — Procedures (Signed)
   US guided RLQ paracentesis  5 liters max obtained in procedure Yellow fluid Sent for labs per MD  Tolerated well  EBL: None

## 2022-10-07 ENCOUNTER — Other Ambulatory Visit: Payer: Self-pay

## 2022-10-07 DIAGNOSIS — D500 Iron deficiency anemia secondary to blood loss (chronic): Secondary | ICD-10-CM

## 2022-10-08 ENCOUNTER — Inpatient Hospital Stay: Payer: BC Managed Care – PPO

## 2022-10-08 ENCOUNTER — Ambulatory Visit (HOSPITAL_COMMUNITY)
Admission: RE | Admit: 2022-10-08 | Discharge: 2022-10-08 | Disposition: A | Discharge status: Home / Self Care | Payer: BC Managed Care – PPO | Source: Ambulatory Visit | Attending: Unknown Physician Specialty | Admitting: Unknown Physician Specialty

## 2022-10-08 ENCOUNTER — Other Ambulatory Visit: Payer: Self-pay

## 2022-10-08 ENCOUNTER — Encounter (HOSPITAL_COMMUNITY): Payer: Self-pay

## 2022-10-08 ENCOUNTER — Inpatient Hospital Stay: Payer: BC Managed Care – PPO | Attending: Physician Assistant

## 2022-10-08 DIAGNOSIS — D500 Iron deficiency anemia secondary to blood loss (chronic): Secondary | ICD-10-CM | POA: Insufficient documentation

## 2022-10-08 DIAGNOSIS — D631 Anemia in chronic kidney disease: Secondary | ICD-10-CM | POA: Insufficient documentation

## 2022-10-08 DIAGNOSIS — I129 Hypertensive chronic kidney disease with stage 1 through stage 4 chronic kidney disease, or unspecified chronic kidney disease: Secondary | ICD-10-CM | POA: Insufficient documentation

## 2022-10-08 DIAGNOSIS — R188 Other ascites: Secondary | ICD-10-CM | POA: Diagnosis present

## 2022-10-08 DIAGNOSIS — C61 Malignant neoplasm of prostate: Secondary | ICD-10-CM | POA: Diagnosis not present

## 2022-10-08 DIAGNOSIS — D6959 Other secondary thrombocytopenia: Secondary | ICD-10-CM | POA: Insufficient documentation

## 2022-10-08 DIAGNOSIS — K7469 Other cirrhosis of liver: Secondary | ICD-10-CM | POA: Diagnosis present

## 2022-10-08 DIAGNOSIS — N1832 Chronic kidney disease, stage 3b: Secondary | ICD-10-CM | POA: Insufficient documentation

## 2022-10-08 LAB — BODY FLUID CELL COUNT WITH DIFFERENTIAL
Eos, Fluid: 1 %
Lymphs, Fluid: 56 %
Monocyte-Macrophage-Serous Fluid: 41 % — ABNORMAL LOW (ref 50–90)
Neutrophil Count, Fluid: 2 % (ref 0–25)
Total Nucleated Cell Count, Fluid: 193 cu mm (ref 0–1000)

## 2022-10-08 LAB — GRAM STAIN

## 2022-10-08 LAB — CBC WITH DIFFERENTIAL/PLATELET
Abs Immature Granulocytes: 0.01 10*3/uL (ref 0.00–0.07)
Basophils Absolute: 0 10*3/uL (ref 0.0–0.1)
Basophils Relative: 1 %
Eosinophils Absolute: 0.3 10*3/uL (ref 0.0–0.5)
Eosinophils Relative: 11 %
HCT: 24.1 % — ABNORMAL LOW (ref 39.0–52.0)
Hemoglobin: 8.1 g/dL — ABNORMAL LOW (ref 13.0–17.0)
Immature Granulocytes: 0 %
Lymphocytes Relative: 23 %
Lymphs Abs: 0.5 10*3/uL — ABNORMAL LOW (ref 0.7–4.0)
MCH: 33.5 pg (ref 26.0–34.0)
MCHC: 33.6 g/dL (ref 30.0–36.0)
MCV: 99.6 fL (ref 80.0–100.0)
Monocytes Absolute: 0.3 10*3/uL (ref 0.1–1.0)
Monocytes Relative: 13 %
Neutro Abs: 1.2 10*3/uL — ABNORMAL LOW (ref 1.7–7.7)
Neutrophils Relative %: 52 %
Platelets: 41 10*3/uL — ABNORMAL LOW (ref 150–400)
RBC: 2.42 MIL/uL — ABNORMAL LOW (ref 4.22–5.81)
RDW: 15.1 % (ref 11.5–15.5)
WBC: 2.3 10*3/uL — ABNORMAL LOW (ref 4.0–10.5)
nRBC: 0 % (ref 0.0–0.2)

## 2022-10-08 MED ORDER — ALBUMIN HUMAN 25 % IV SOLN: 50.0000 g | Freq: Once | INTRAVENOUS | Status: AC

## 2022-10-08 MED ORDER — ALBUMIN HUMAN 25 % IV SOLN
INTRAVENOUS | Status: AC
  Administered 2022-10-08: 50 g via INTRAVENOUS
  Filled 2022-10-08: qty 200

## 2022-10-08 NOTE — Progress Notes (Signed)
Paracentesis complete no signs of distress. 5L ascites removed and 50 Grams IV Albumin given.

## 2022-10-08 NOTE — Procedures (Signed)
   US guided RLQ paracentesis  5 Liters (max per MD) yellow fluid Sent for labs Tolerated well  EBL:  less than 1 cc

## 2022-10-13 LAB — CULTURE, BODY FLUID W GRAM STAIN -BOTTLE: Culture: NO GROWTH

## 2022-10-15 ENCOUNTER — Ambulatory Visit (HOSPITAL_COMMUNITY)
Admission: RE | Admit: 2022-10-15 | Discharge: 2022-10-15 | Disposition: A | Discharge status: Home / Self Care | Payer: BC Managed Care – PPO | Source: Ambulatory Visit | Attending: Unknown Physician Specialty | Admitting: Unknown Physician Specialty

## 2022-10-15 DIAGNOSIS — R188 Other ascites: Secondary | ICD-10-CM | POA: Insufficient documentation

## 2022-10-15 DIAGNOSIS — E782 Mixed hyperlipidemia: Secondary | ICD-10-CM | POA: Diagnosis not present

## 2022-10-15 DIAGNOSIS — K7460 Unspecified cirrhosis of liver: Secondary | ICD-10-CM | POA: Insufficient documentation

## 2022-10-15 DIAGNOSIS — K7469 Other cirrhosis of liver: Secondary | ICD-10-CM | POA: Insufficient documentation

## 2022-10-15 LAB — GLUCOSE, PLEURAL OR PERITONEAL FLUID: Glucose, Fluid: 112 mg/dL

## 2022-10-15 LAB — BODY FLUID CELL COUNT WITH DIFFERENTIAL
Eos, Fluid: 0 %
Lymphs, Fluid: 64 %
Monocyte-Macrophage-Serous Fluid: 32 % — ABNORMAL LOW (ref 50–90)
Neutrophil Count, Fluid: 4 % (ref 0–25)
Total Nucleated Cell Count, Fluid: 152 cu mm (ref 0–1000)

## 2022-10-15 LAB — LACTATE DEHYDROGENASE, PLEURAL OR PERITONEAL FLUID: LD, Fluid: 59 U/L — ABNORMAL HIGH (ref 3–23)

## 2022-10-15 LAB — ALBUMIN, PLEURAL OR PERITONEAL FLUID: Albumin, Fluid: <1.5 g/dL

## 2022-10-15 LAB — PROTEIN, PLEURAL OR PERITONEAL FLUID: Total protein, fluid: <3 g/dL

## 2022-10-15 LAB — GRAM STAIN

## 2022-10-15 MED ORDER — ALBUMIN HUMAN 25 % IV SOLN
INTRAVENOUS | Status: AC
  Administered 2022-10-15: 25 g
  Filled 2022-10-15: qty 150

## 2022-10-15 MED ORDER — ALBUMIN HUMAN 25 % IV SOLN
INTRAVENOUS | Status: AC
  Administered 2022-10-15: 25 g
  Filled 2022-10-15: qty 50

## 2022-10-15 NOTE — Progress Notes (Signed)
Patient brought to [place], placed on stretcher. Paracentesis explained, consent obtained. Prepped draped in sterile manner.

## 2022-10-15 NOTE — Procedures (Signed)
PreOperative Dx: Cryptogenic Cirrhosis, ascites Postoperative Dx: Cryptogenic  Cirrhosis, ascites Procedure:   US guided paracentesis Radiologist:  Thornton Papas Anesthesia:  10 ml of1% lidocaine Specimen:  5 L of yellow ascitic fluid EBL:   < 1 ml Complications:  None

## 2022-10-19 LAB — LIPASE, FLUID: Lipase-Fluid: 64 U/L

## 2022-10-19 LAB — PH, BODY FLUID: pH, Body Fluid: 7.2

## 2022-10-20 LAB — CULTURE, BODY FLUID W GRAM STAIN -BOTTLE: Culture: NO GROWTH

## 2022-10-20 LAB — CYTOLOGY - NON PAP

## 2022-10-22 ENCOUNTER — Other Ambulatory Visit (HOSPITAL_COMMUNITY)
Admission: RE | Admit: 2022-10-22 | Discharge: 2022-10-22 | Disposition: A | Discharge status: Home / Self Care | Payer: BC Managed Care – PPO | Source: Ambulatory Visit | Attending: Internal Medicine | Admitting: Internal Medicine

## 2022-10-22 ENCOUNTER — Inpatient Hospital Stay: Payer: BC Managed Care – PPO

## 2022-10-22 ENCOUNTER — Ambulatory Visit (HOSPITAL_COMMUNITY)
Admission: RE | Admit: 2022-10-22 | Discharge: 2022-10-22 | Disposition: A | Discharge status: Home / Self Care | Payer: BC Managed Care – PPO | Source: Ambulatory Visit | Attending: Unknown Physician Specialty | Admitting: Unknown Physician Specialty

## 2022-10-22 ENCOUNTER — Other Ambulatory Visit: Payer: Self-pay

## 2022-10-22 VITALS — BP 122/46 | HR 85 | Temp 97.9°F | Resp 18

## 2022-10-22 DIAGNOSIS — D500 Iron deficiency anemia secondary to blood loss (chronic): Secondary | ICD-10-CM | POA: Insufficient documentation

## 2022-10-22 DIAGNOSIS — R946 Abnormal results of thyroid function studies: Secondary | ICD-10-CM | POA: Diagnosis not present

## 2022-10-22 DIAGNOSIS — I129 Hypertensive chronic kidney disease with stage 1 through stage 4 chronic kidney disease, or unspecified chronic kidney disease: Secondary | ICD-10-CM | POA: Diagnosis not present

## 2022-10-22 DIAGNOSIS — D631 Anemia in chronic kidney disease: Secondary | ICD-10-CM

## 2022-10-22 DIAGNOSIS — D649 Anemia, unspecified: Secondary | ICD-10-CM

## 2022-10-22 DIAGNOSIS — K7469 Other cirrhosis of liver: Secondary | ICD-10-CM | POA: Diagnosis present

## 2022-10-22 DIAGNOSIS — R188 Other ascites: Secondary | ICD-10-CM | POA: Diagnosis present

## 2022-10-22 DIAGNOSIS — D696 Thrombocytopenia, unspecified: Secondary | ICD-10-CM

## 2022-10-22 DIAGNOSIS — E7211 Homocystinuria: Secondary | ICD-10-CM

## 2022-10-22 LAB — CBC WITH DIFFERENTIAL/PLATELET
Abs Immature Granulocytes: 0.01 K/uL (ref 0.00–0.07)
Basophils Absolute: 0 K/uL (ref 0.0–0.1)
Basophils Relative: 1 %
Eosinophils Absolute: 0.6 K/uL — ABNORMAL HIGH (ref 0.0–0.5)
Eosinophils Relative: 16 %
HCT: 26.9 % — ABNORMAL LOW (ref 39.0–52.0)
Hemoglobin: 8.9 g/dL — ABNORMAL LOW (ref 13.0–17.0)
Immature Granulocytes: 0 %
Lymphocytes Relative: 16 %
Lymphs Abs: 0.6 K/uL — ABNORMAL LOW (ref 0.7–4.0)
MCH: 33.1 pg (ref 26.0–34.0)
MCHC: 33.1 g/dL (ref 30.0–36.0)
MCV: 100 fL (ref 80.0–100.0)
Monocytes Absolute: 0.3 K/uL (ref 0.1–1.0)
Monocytes Relative: 10 %
Neutro Abs: 2.1 K/uL (ref 1.7–7.7)
Neutrophils Relative %: 57 %
Platelets: 50 K/uL — ABNORMAL LOW (ref 150–400)
RBC: 2.69 MIL/uL — ABNORMAL LOW (ref 4.22–5.81)
RDW: 14.7 % (ref 11.5–15.5)
Smear Review: DECREASED
WBC: 3.6 K/uL — ABNORMAL LOW (ref 4.0–10.5)
nRBC: 0 % (ref 0.0–0.2)

## 2022-10-22 LAB — SAMPLE TO BLOOD BANK

## 2022-10-22 LAB — T4, FREE: Free T4: 1.21 ng/dL — ABNORMAL HIGH (ref 0.61–1.12)

## 2022-10-22 LAB — BODY FLUID CELL COUNT WITH DIFFERENTIAL
Eos, Fluid: 0 %
Lymphs, Fluid: 49 %
Monocyte-Macrophage-Serous Fluid: 44 % — ABNORMAL LOW (ref 50–90)
Neutrophil Count, Fluid: 7 % (ref 0–25)
Other Cells, Fluid: 10 %
Total Nucleated Cell Count, Fluid: 201 cu mm (ref 0–1000)

## 2022-10-22 LAB — GRAM STAIN

## 2022-10-22 LAB — TSH: TSH: 3.135 u[IU]/mL (ref 0.350–4.500)

## 2022-10-22 MED ORDER — EPOETIN ALFA-EPBX 10000 UNIT/ML IJ SOLN
10000.0000 [IU] | Freq: Once | INTRAMUSCULAR | Status: AC
  Administered 2022-10-22: 10000 [IU] via SUBCUTANEOUS
  Filled 2022-10-22: qty 1

## 2022-10-22 MED ORDER — ALBUMIN HUMAN 25 % IV SOLN
INTRAVENOUS | Status: AC
  Administered 2022-10-22: 50 g via INTRAVENOUS
  Filled 2022-10-22: qty 200

## 2022-10-22 MED ORDER — ALBUMIN HUMAN 25 % IV SOLN: 50.0000 g | Freq: Once | INTRAVENOUS | Status: AC

## 2022-10-22 NOTE — Procedures (Signed)
PreOperative Dx: Cryptogenic cirrhosis, ascites Postoperative Dx: Cryptogenic cirrhosis, ascites Procedure:   US guided paracentesis Radiologist:  Thornton Papas Anesthesia:  10 ml of1% lidocaine Specimen:  5 L of yellow ascitic fluid EBL:   < 1 ml Complications:  None

## 2022-10-22 NOTE — Patient Instructions (Signed)
[facility]  Discharge Instructions: Thank you for choosing [facility] to provide your oncology and hematology care.  If you have a lab appointment with the [facility], please come in thru the Main Entrance and check in at the main information desk.  Wear comfortable clothing and clothing appropriate for easy access to any Portacath or PICC line.   We strive to give you quality time with your provider. You may need to reschedule your appointment if you arrive late (15 or more minutes).  Arriving late affects you and other patients whose appointments are after yours.  Also, if you miss three or more appointments without notifying the office, you may be dismissed from the clinic at the provider's discretion.      For prescription refill requests, have your pharmacy contact our office and allow 72 hours for refills to be completed.    Today you received the following chemotherapy and/or immunotherapy agents Retacrit      To help prevent nausea and vomiting after your treatment, we encourage you to take your nausea medication as directed.  BELOW ARE SYMPTOMS THAT SHOULD BE REPORTED IMMEDIATELY: *FEVER GREATER THAN 100.4 F (38 C) OR HIGHER *CHILLS OR SWEATING *NAUSEA AND VOMITING THAT IS NOT CONTROLLED WITH YOUR NAUSEA MEDICATION *UNUSUAL SHORTNESS OF BREATH *UNUSUAL BRUISING OR BLEEDING *URINARY PROBLEMS (pain or burning when urinating, or frequent urination) *BOWEL PROBLEMS (unusual diarrhea, constipation, pain near the anus) TENDERNESS IN MOUTH AND THROAT WITH OR WITHOUT PRESENCE OF ULCERS (sore throat, sores in mouth, or a toothache) UNUSUAL RASH, SWELLING OR PAIN  UNUSUAL VAGINAL DISCHARGE OR ITCHING   Items with * indicate a potential emergency and should be followed up as soon as possible or go to the Emergency Department if any problems should occur.  Please show the CHEMOTHERAPY ALERT CARD or IMMUNOTHERAPY ALERT CARD at check-in to the  Emergency Department and triage nurse.  Should you have questions after your visit or need to cancel or reschedule your appointment, please contact [facility] [phone]  and follow the prompts.  Office hours are 8:00 a.m. to 4:30 p.m. Monday - Friday. Please note that voicemails left after 4:00 p.m. may not be returned until the following business day.  We are closed weekends and major holidays. You have access to a nurse at all times for urgent questions. Please call the main number to the clinic [phone] and follow the prompts.  For any non-urgent questions, you may also contact your provider using MyChart. We now offer e-Visits for anyone 33 and older to request care online for non-urgent symptoms. For details visit [email or web address].   Also download the MyChart app! Go to the app store, search "MyChart", open the app, select [facility], and log in with your MyChart username and password.  Masks are optional in the cancer centers. If you would like for your care team to wear a mask while they are taking care of you, please let them know. You may have one support person who is at least 62 years old accompany you for your appointments.

## 2022-10-22 NOTE — Progress Notes (Signed)
Paracentesis complete no signs of distress. 50 grams IV Albumin given IV, 5L ascites removed.

## 2022-10-22 NOTE — Progress Notes (Signed)
Patient presents today for Retacrit injection for Hgb of 8.9 per providers order.  Vital signs within parameters for treatment.  Patient has no new complaints at this time.  Stable during administration without incident; injection site WNL; see MAR for injection details.  Patient tolerated procedure well and without incident.  No questions or complaints noted at this time.

## 2022-10-27 ENCOUNTER — Other Ambulatory Visit (HOSPITAL_COMMUNITY): Payer: Self-pay | Admitting: Unknown Physician Specialty

## 2022-10-27 DIAGNOSIS — K7469 Other cirrhosis of liver: Secondary | ICD-10-CM

## 2022-10-27 DIAGNOSIS — R188 Other ascites: Secondary | ICD-10-CM

## 2022-10-27 LAB — CULTURE, BODY FLUID W GRAM STAIN -BOTTLE: Culture: NO GROWTH

## 2022-10-29 ENCOUNTER — Encounter (HOSPITAL_COMMUNITY): Payer: Self-pay

## 2022-10-29 ENCOUNTER — Ambulatory Visit (HOSPITAL_COMMUNITY)
Admission: RE | Admit: 2022-10-29 | Discharge: 2022-10-29 | Disposition: A | Discharge status: Home / Self Care | Payer: BC Managed Care – PPO | Source: Ambulatory Visit | Attending: Unknown Physician Specialty | Admitting: Unknown Physician Specialty

## 2022-10-29 DIAGNOSIS — K7469 Other cirrhosis of liver: Secondary | ICD-10-CM | POA: Diagnosis present

## 2022-10-29 DIAGNOSIS — R188 Other ascites: Secondary | ICD-10-CM | POA: Diagnosis present

## 2022-10-29 LAB — GRAM STAIN

## 2022-10-29 MED ORDER — ALBUMIN HUMAN 25 % IV SOLN
INTRAVENOUS | Status: AC
  Administered 2022-10-29: 50 g via INTRAVENOUS
  Filled 2022-10-29: qty 200

## 2022-10-29 MED ORDER — ALBUMIN HUMAN 25 % IV SOLN: 50.0000 g | Freq: Once | INTRAVENOUS | Status: AC

## 2022-10-29 NOTE — Procedures (Signed)
PreOperative Dx: Cryptogenic cirrhosis, ascites Postoperative Dx: Cryptogenic cirrhosis, ascites Procedure:   US guided paracentesis Radiologist:  Thornton Papas Anesthesia:  10 ml of1% lidocaine Specimen:  4.0 L of yellow ascitic fluid EBL:   < 1 ml Complications:  None

## 2022-10-29 NOTE — Progress Notes (Signed)
Paracentesis complete no signs of distress. 4L ascites removed.

## 2022-10-30 LAB — BODY FLUID CELL COUNT WITH DIFFERENTIAL
Eos, Fluid: 3 %
Lymphs, Fluid: 52 %
Monocyte-Macrophage-Serous Fluid: 39 % — ABNORMAL LOW (ref 50–90)
Neutrophil Count, Fluid: 6 % (ref 0–25)
Other Cells, Fluid: 9 %
Total Nucleated Cell Count, Fluid: 221 cu mm (ref 0–1000)

## 2022-11-03 LAB — CULTURE, BODY FLUID W GRAM STAIN -BOTTLE: Culture: NO GROWTH

## 2022-11-04 ENCOUNTER — Inpatient Hospital Stay: Payer: BC Managed Care – PPO

## 2022-11-05 ENCOUNTER — Encounter (HOSPITAL_COMMUNITY): Payer: Self-pay

## 2022-11-05 ENCOUNTER — Ambulatory Visit (HOSPITAL_COMMUNITY)
Admission: RE | Admit: 2022-11-05 | Discharge: 2022-11-05 | Disposition: A | Discharge status: Home / Self Care | Payer: BC Managed Care – PPO | Source: Ambulatory Visit | Attending: Unknown Physician Specialty | Admitting: Unknown Physician Specialty

## 2022-11-05 DIAGNOSIS — R188 Other ascites: Secondary | ICD-10-CM

## 2022-11-05 DIAGNOSIS — K7469 Other cirrhosis of liver: Secondary | ICD-10-CM | POA: Diagnosis not present

## 2022-11-05 LAB — GRAM STAIN: Gram Stain: NONE SEEN

## 2022-11-05 LAB — BODY FLUID CELL COUNT WITH DIFFERENTIAL
Eos, Fluid: 0 %
Lymphs, Fluid: 37 %
Monocyte-Macrophage-Serous Fluid: 56 % (ref 50–90)
Neutrophil Count, Fluid: 7 % (ref 0–25)
Total Nucleated Cell Count, Fluid: 126 cu mm (ref 0–1000)

## 2022-11-05 MED ORDER — ALBUMIN HUMAN 25 % IV SOLN
INTRAVENOUS | Status: AC
  Administered 2022-11-05: 50 g via INTRAVENOUS
  Filled 2022-11-05: qty 200

## 2022-11-05 MED ORDER — ALBUMIN HUMAN 25 % IV SOLN: 50.0000 g | Freq: Once | INTRAVENOUS | Status: AC

## 2022-11-05 NOTE — Progress Notes (Signed)
Paracentesis complete no signs of distress. 5L ascites removed.  

## 2022-11-05 NOTE — Procedures (Signed)
   Cryptogenic cirrhosis Recurrent ascites  US guided RLQ paracentesis  5 Liter max obtained Sent for labs per MD  Tolerated well  EBL: scant

## 2022-11-06 ENCOUNTER — Inpatient Hospital Stay: Payer: BC Managed Care – PPO

## 2022-11-06 ENCOUNTER — Inpatient Hospital Stay: Payer: BC Managed Care – PPO | Attending: Physician Assistant

## 2022-11-06 ENCOUNTER — Inpatient Hospital Stay: Payer: BC Managed Care – PPO | Admitting: Physician Assistant

## 2022-11-06 VITALS — BP 127/41 | HR 72 | Temp 98.5°F | Resp 18

## 2022-11-06 DIAGNOSIS — D72819 Decreased white blood cell count, unspecified: Secondary | ICD-10-CM | POA: Diagnosis not present

## 2022-11-06 DIAGNOSIS — N1832 Chronic kidney disease, stage 3b: Secondary | ICD-10-CM | POA: Insufficient documentation

## 2022-11-06 DIAGNOSIS — D696 Thrombocytopenia, unspecified: Secondary | ICD-10-CM | POA: Insufficient documentation

## 2022-11-06 DIAGNOSIS — R634 Abnormal weight loss: Secondary | ICD-10-CM | POA: Insufficient documentation

## 2022-11-06 DIAGNOSIS — D500 Iron deficiency anemia secondary to blood loss (chronic): Secondary | ICD-10-CM

## 2022-11-06 DIAGNOSIS — D631 Anemia in chronic kidney disease: Secondary | ICD-10-CM | POA: Diagnosis not present

## 2022-11-06 DIAGNOSIS — R161 Splenomegaly, not elsewhere classified: Secondary | ICD-10-CM | POA: Diagnosis not present

## 2022-11-06 DIAGNOSIS — I129 Hypertensive chronic kidney disease with stage 1 through stage 4 chronic kidney disease, or unspecified chronic kidney disease: Secondary | ICD-10-CM | POA: Insufficient documentation

## 2022-11-06 DIAGNOSIS — D61818 Other pancytopenia: Secondary | ICD-10-CM | POA: Insufficient documentation

## 2022-11-06 DIAGNOSIS — K7460 Unspecified cirrhosis of liver: Secondary | ICD-10-CM | POA: Insufficient documentation

## 2022-11-06 DIAGNOSIS — C61 Malignant neoplasm of prostate: Secondary | ICD-10-CM | POA: Diagnosis not present

## 2022-11-06 LAB — SAMPLE TO BLOOD BANK

## 2022-11-06 LAB — CBC WITH DIFFERENTIAL/PLATELET
Abs Immature Granulocytes: 0 10*3/uL (ref 0.00–0.07)
Basophils Absolute: 0 10*3/uL (ref 0.0–0.1)
Basophils Relative: 0 %
Eosinophils Absolute: 0.9 10*3/uL — ABNORMAL HIGH (ref 0.0–0.5)
Eosinophils Relative: 23 %
HCT: 26.5 % — ABNORMAL LOW (ref 39.0–52.0)
Hemoglobin: 8.8 g/dL — ABNORMAL LOW (ref 13.0–17.0)
Lymphocytes Relative: 11 %
Lymphs Abs: 0.4 10*3/uL — ABNORMAL LOW (ref 0.7–4.0)
MCH: 33.6 pg (ref 26.0–34.0)
MCHC: 33.2 g/dL (ref 30.0–36.0)
MCV: 101.1 fL — ABNORMAL HIGH (ref 80.0–100.0)
Monocytes Absolute: 0.3 10*3/uL (ref 0.1–1.0)
Monocytes Relative: 7 %
Neutro Abs: 2.4 10*3/uL (ref 1.7–7.7)
Neutrophils Relative %: 59 %
Platelets: 46 10*3/uL — ABNORMAL LOW (ref 150–400)
RBC: 2.62 MIL/uL — ABNORMAL LOW (ref 4.22–5.81)
RDW: 14.6 % (ref 11.5–15.5)
Smear Review: DECREASED
WBC: 4 10*3/uL (ref 4.0–10.5)
nRBC: 0 % (ref 0.0–0.2)

## 2022-11-06 LAB — PATHOLOGIST SMEAR REVIEW

## 2022-11-06 MED ORDER — EPOETIN ALFA-EPBX 10000 UNIT/ML IJ SOLN
10000.0000 [IU] | Freq: Once | INTRAMUSCULAR | Status: AC
  Administered 2022-11-06: 10000 [IU] via SUBCUTANEOUS
  Filled 2022-11-06: qty 1

## 2022-11-06 NOTE — Patient Instructions (Signed)
[facility]  Discharge Instructions: Thank you for choosing [facility] to provide your oncology and hematology care.  If you have a lab appointment with the Cancer Center, please come in thru the Main Entrance and check in at the main information desk.  Wear comfortable clothing and clothing appropriate for easy access to any Portacath or PICC line.   We strive to give you quality time with your provider. You may need to reschedule your appointment if you arrive late (15 or more minutes).  Arriving late affects you and other patients whose appointments are after yours.  Also, if you miss three or more appointments without notifying the office, you may be dismissed from the clinic at the provider's discretion.      For prescription refill requests, have your pharmacy contact our office and allow 72 hours for refills to be completed.    Today you received the following chemotherapy and/or immunotherapy agents Retacrit      To help prevent nausea and vomiting after your treatment, we encourage you to take your nausea medication as directed.  BELOW ARE SYMPTOMS THAT SHOULD BE REPORTED IMMEDIATELY: *FEVER GREATER THAN 100.4 F (38 C) OR HIGHER *CHILLS OR SWEATING *NAUSEA AND VOMITING THAT IS NOT CONTROLLED WITH YOUR NAUSEA MEDICATION *UNUSUAL SHORTNESS OF BREATH *UNUSUAL BRUISING OR BLEEDING *URINARY PROBLEMS (pain or burning when urinating, or frequent urination) *BOWEL PROBLEMS (unusual diarrhea, constipation, pain near the anus) TENDERNESS IN MOUTH AND THROAT WITH OR WITHOUT PRESENCE OF ULCERS (sore throat, sores in mouth, or a toothache) UNUSUAL RASH, SWELLING OR PAIN  UNUSUAL VAGINAL DISCHARGE OR ITCHING   Items with * indicate a potential emergency and should be followed up as soon as possible or go to the Emergency Department if any problems should occur.  Please show the CHEMOTHERAPY ALERT CARD or IMMUNOTHERAPY ALERT CARD at check-in to the  Emergency Department and triage nurse.  Should you have questions after your visit or need to cancel or reschedule your appointment, please contact [facility] [phone]  and follow the prompts.  Office hours are 8:00 a.m. to 4:30 p.m. Monday - Friday. Please note that voicemails left after 4:00 p.m. may not be returned until the following business day.  We are closed weekends and major holidays. You have access to a nurse at all times for urgent questions. Please call the main number to the clinic [phone] and follow the prompts.  For any non-urgent questions, you may also contact your provider using MyChart. We now offer e-Visits for anyone 18 and older to request care online for non-urgent symptoms. For details visit [email or web address].   Also download the MyChart app! Go to the app store, search "MyChart", open the app, select [facility], and log in with your MyChart username and password.   

## 2022-11-06 NOTE — Progress Notes (Signed)
Patient presents today for Retacrit injection per providers order.  Vital signs WNL.  Hgb 8.8.  No new complaints at this time.  Stable during administration without incident; injection site WNL; see MAR for injection details.  Patient tolerated procedure well and without incident.  No questions or complaints noted at this time.

## 2022-11-09 ENCOUNTER — Other Ambulatory Visit: Payer: Self-pay | Admitting: Physician Assistant

## 2022-11-10 LAB — CULTURE, BODY FLUID W GRAM STAIN -BOTTLE: Culture: NO GROWTH

## 2022-11-12 ENCOUNTER — Ambulatory Visit (HOSPITAL_COMMUNITY)
Admission: RE | Admit: 2022-11-12 | Discharge: 2022-11-12 | Disposition: A | Discharge status: Home / Self Care | Payer: BC Managed Care – PPO | Source: Ambulatory Visit | Attending: Unknown Physician Specialty | Admitting: Unknown Physician Specialty

## 2022-11-12 ENCOUNTER — Encounter (HOSPITAL_COMMUNITY): Payer: Self-pay

## 2022-11-12 DIAGNOSIS — R188 Other ascites: Secondary | ICD-10-CM | POA: Insufficient documentation

## 2022-11-12 DIAGNOSIS — K7469 Other cirrhosis of liver: Secondary | ICD-10-CM | POA: Insufficient documentation

## 2022-11-12 LAB — BODY FLUID CELL COUNT WITH DIFFERENTIAL
Lymphs, Fluid: 52 %
Monocyte-Macrophage-Serous Fluid: 44 % — ABNORMAL LOW (ref 50–90)
Neutrophil Count, Fluid: 4 % (ref 0–25)
Total Nucleated Cell Count, Fluid: 187 cu mm (ref 0–1000)

## 2022-11-12 MED ORDER — ALBUMIN HUMAN 25 % IV SOLN: 50.0000 g | Freq: Once | INTRAVENOUS | Status: AC

## 2022-11-12 MED ORDER — ALBUMIN HUMAN 25 % IV SOLN
INTRAVENOUS | Status: AC
  Administered 2022-11-12: 50 g via INTRAVENOUS
  Filled 2022-11-12: qty 200

## 2022-11-12 NOTE — Procedures (Signed)
   Cryptogenic cirrhosis Recurrent ascites  US guided LLQ paracentesis 5 L max Yellow fluid obtained Sent for labs  Tolerated well  EBL: scant

## 2022-11-12 NOTE — Progress Notes (Signed)
Patient tolerated left sided paracentesis procedure and 50G of IV albumin well today and 5 Liters of clear yellow ascites removed with labs collected and sent for processing. PT verbalized understanding of discharge instructions and ambulatory at departure with no acute distress noted.

## 2022-11-18 LAB — FUNGAL ORGANISM REFLEX

## 2022-11-18 LAB — FUNGUS CULTURE WITH STAIN

## 2022-11-18 LAB — FUNGUS CULTURE RESULT

## 2022-11-19 ENCOUNTER — Encounter (HOSPITAL_COMMUNITY): Payer: Self-pay

## 2022-11-19 ENCOUNTER — Ambulatory Visit (HOSPITAL_COMMUNITY)
Admission: RE | Admit: 2022-11-19 | Discharge: 2022-11-19 | Disposition: A | Discharge status: Home / Self Care | Payer: BC Managed Care – PPO | Source: Ambulatory Visit | Attending: Unknown Physician Specialty | Admitting: Unknown Physician Specialty

## 2022-11-19 DIAGNOSIS — R188 Other ascites: Secondary | ICD-10-CM | POA: Insufficient documentation

## 2022-11-19 DIAGNOSIS — K7469 Other cirrhosis of liver: Secondary | ICD-10-CM | POA: Diagnosis present

## 2022-11-19 LAB — BODY FLUID CELL COUNT WITH DIFFERENTIAL
Eos, Fluid: 1 %
Lymphs, Fluid: 64 %
Monocyte-Macrophage-Serous Fluid: 33 % — ABNORMAL LOW (ref 50–90)
Neutrophil Count, Fluid: 2 % (ref 0–25)
Total Nucleated Cell Count, Fluid: 143 cu mm (ref 0–1000)

## 2022-11-19 MED ORDER — ALBUMIN HUMAN 25 % IV SOLN
INTRAVENOUS | Status: AC
  Administered 2022-11-19: 50 g via INTRAVENOUS
  Filled 2022-11-19: qty 200

## 2022-11-19 MED ORDER — ALBUMIN HUMAN 25 % IV SOLN: 50.0000 g | Freq: Once | INTRAVENOUS | Status: AC

## 2022-11-19 NOTE — Procedures (Signed)
   Recurrent ascites  US guided RLQ paracentesis 5 liters (max per MD) [place] fluid Sent for labs per MD  Tolerated well  EBL: none

## 2022-11-19 NOTE — Progress Notes (Signed)
Patient tolerated right sided paracentesis procedure and 50G of IV albumin well today and 5 Liters of ascites removed with labs collected and sent for processing. Pt verbalized understanding of discharge instructions and ambulatory at departure with no acute distress noted.

## 2022-11-20 ENCOUNTER — Inpatient Hospital Stay: Payer: BC Managed Care – PPO

## 2022-11-20 VITALS — BP 117/57 | HR 80 | Temp 98.7°F | Resp 18

## 2022-11-20 DIAGNOSIS — D500 Iron deficiency anemia secondary to blood loss (chronic): Secondary | ICD-10-CM

## 2022-11-20 DIAGNOSIS — I129 Hypertensive chronic kidney disease with stage 1 through stage 4 chronic kidney disease, or unspecified chronic kidney disease: Secondary | ICD-10-CM | POA: Diagnosis not present

## 2022-11-20 DIAGNOSIS — D696 Thrombocytopenia, unspecified: Secondary | ICD-10-CM

## 2022-11-20 DIAGNOSIS — D649 Anemia, unspecified: Secondary | ICD-10-CM

## 2022-11-20 DIAGNOSIS — E7211 Homocystinuria: Secondary | ICD-10-CM

## 2022-11-20 LAB — COMPREHENSIVE METABOLIC PANEL
ALT: 40 U/L (ref 0–44)
AST: 58 U/L — ABNORMAL HIGH (ref 15–41)
Albumin: 3.3 g/dL — ABNORMAL LOW (ref 3.5–5.0)
Alkaline Phosphatase: 148 U/L — ABNORMAL HIGH (ref 38–126)
Anion gap: 6 (ref 5–15)
BUN: 65 mg/dL — ABNORMAL HIGH (ref 8–23)
CO2: 19 mmol/L — ABNORMAL LOW (ref 22–32)
Calcium: 8 mg/dL — ABNORMAL LOW (ref 8.9–10.3)
Chloride: 109 mmol/L (ref 98–111)
Creatinine, Ser: 1.97 mg/dL — ABNORMAL HIGH (ref 0.61–1.24)
GFR, Estimated: 38 mL/min — ABNORMAL LOW (ref >60)
Glucose, Bld: 191 mg/dL — ABNORMAL HIGH (ref 70–99)
Potassium: 4.1 mmol/L (ref 3.5–5.1)
Sodium: 134 mmol/L — ABNORMAL LOW (ref 135–145)
Total Bilirubin: 1.2 mg/dL (ref 0.3–1.2)
Total Protein: 5.6 g/dL — ABNORMAL LOW (ref 6.5–8.1)

## 2022-11-20 LAB — CBC WITH DIFFERENTIAL/PLATELET
Abs Immature Granulocytes: 0.01 10*3/uL (ref 0.00–0.07)
Basophils Absolute: 0 10*3/uL (ref 0.0–0.1)
Basophils Relative: 0 %
Eosinophils Absolute: 0.3 10*3/uL (ref 0.0–0.5)
Eosinophils Relative: 9 %
HCT: 25.5 % — ABNORMAL LOW (ref 39.0–52.0)
Hemoglobin: 8.4 g/dL — ABNORMAL LOW (ref 13.0–17.0)
Immature Granulocytes: 0 %
Lymphocytes Relative: 15 %
Lymphs Abs: 0.4 10*3/uL — ABNORMAL LOW (ref 0.7–4.0)
MCH: 33.2 pg (ref 26.0–34.0)
MCHC: 32.9 g/dL (ref 30.0–36.0)
MCV: 100.8 fL — ABNORMAL HIGH (ref 80.0–100.0)
Monocytes Absolute: 0.3 10*3/uL (ref 0.1–1.0)
Monocytes Relative: 10 %
Neutro Abs: 1.8 10*3/uL (ref 1.7–7.7)
Neutrophils Relative %: 66 %
Platelets: 61 10*3/uL — ABNORMAL LOW (ref 150–400)
RBC: 2.53 MIL/uL — ABNORMAL LOW (ref 4.22–5.81)
RDW: 14.6 % (ref 11.5–15.5)
WBC: 2.8 10*3/uL — ABNORMAL LOW (ref 4.0–10.5)
nRBC: 0 % (ref 0.0–0.2)

## 2022-11-20 LAB — FERRITIN: Ferritin: 89 ng/mL (ref 24–336)

## 2022-11-20 LAB — IRON AND TIBC
Iron: 41 ug/dL — ABNORMAL LOW (ref 45–182)
Saturation Ratios: 22 % (ref 17.9–39.5)
TIBC: 191 ug/dL — ABNORMAL LOW (ref 250–450)
UIBC: 150 ug/dL

## 2022-11-20 MED ORDER — EPOETIN ALFA-EPBX 10000 UNIT/ML IJ SOLN
10000.0000 [IU] | Freq: Once | INTRAMUSCULAR | Status: AC
  Administered 2022-11-20: 10000 [IU] via SUBCUTANEOUS
  Filled 2022-11-20: qty 1

## 2022-11-20 NOTE — Patient Instructions (Signed)
[facility]  Discharge Instructions: Thank you for choosing [facility] to provide your oncology and hematology care.  If you have a lab appointment with the Cancer Center, please come in thru the Main Entrance and check in at the main information desk.  Wear comfortable clothing and clothing appropriate for easy access to any Portacath or PICC line.   We strive to give you quality time with your provider. You may need to reschedule your appointment if you arrive late (15 or more minutes).  Arriving late affects you and other patients whose appointments are after yours.  Also, if you miss three or more appointments without notifying the office, you may be dismissed from the clinic at the provider's discretion.      For prescription refill requests, have your pharmacy contact our office and allow 72 hours for refills to be completed.    To help prevent nausea and vomiting after your treatment, we encourage you to take your nausea medication as directed.  BELOW ARE SYMPTOMS THAT SHOULD BE REPORTED IMMEDIATELY: *FEVER GREATER THAN 100.4 F (38 C) OR HIGHER *CHILLS OR SWEATING *NAUSEA AND VOMITING THAT IS NOT CONTROLLED WITH YOUR NAUSEA MEDICATION *UNUSUAL SHORTNESS OF BREATH *UNUSUAL BRUISING OR BLEEDING *URINARY PROBLEMS (pain or burning when urinating, or frequent urination) *BOWEL PROBLEMS (unusual diarrhea, constipation, pain near the anus) TENDERNESS IN MOUTH AND THROAT WITH OR WITHOUT PRESENCE OF ULCERS (sore throat, sores in mouth, or a toothache) UNUSUAL RASH, SWELLING OR PAIN  UNUSUAL VAGINAL DISCHARGE OR ITCHING   Items with * indicate a potential emergency and should be followed up as soon as possible or go to the Emergency Department if any problems should occur.  Please show the CHEMOTHERAPY ALERT CARD or IMMUNOTHERAPY ALERT CARD at check-in to the Emergency Department and triage nurse.  Should you have questions after your visit or need to  cancel or reschedule your appointment, please contact [facility] [phone]  and follow the prompts.  Office hours are 8:00 a.m. to 4:30 p.m. Monday - Friday. Please note that voicemails left after 4:00 p.m. may not be returned until the following business day.  We are closed weekends and major holidays. You have access to a nurse at all times for urgent questions. Please call the main number to the clinic [phone] and follow the prompts.  For any non-urgent questions, you may also contact your provider using MyChart. We now offer e-Visits for anyone 18 and older to request care online for non-urgent symptoms. For details visit [email or web address].   Also download the MyChart app! Go to the app store, search "MyChart", open the app, select [facility], and log in with your MyChart username and password.   

## 2022-11-20 NOTE — Progress Notes (Signed)
Patient presents today for Retacrit injection.  Patient c/o weakness and feeling very tired.  Hemoglobin today is 8.1.  Vital signs are stable.  A message was sent to [employer], PA-C.  Patient will see her on Monday to discuss.  Patient advised to be seen in the ED if symptoms worsened.    Patient tolerated injection with no complaints voiced.  Site clean and dry with no bruising or swelling noted.  No complaints of pain.  Discharged with vital signs stable and no signs or symptoms of distress noted.

## 2022-11-21 NOTE — Progress Notes (Signed)
[place], [place] [address]   CLINIC:  Medical Oncology/Hematology  PCP:  Lindell Spar, MD [address] [place] [address] [phone]   REASON FOR VISIT:  Follow-up for cirrhosis related pancytopenia   PRIOR THERAPY: Platelet transfusion x2 (prior to prostate biopsy and prostate SpaceOAR placement)   CURRENT THERAPY: Intermittent iron infusions + Retacrit  INTERVAL HISTORY:   Michael Harmon 62 y.o. 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  He was last seen by Tarri Abernethy PA-C on 09/21/2022.  At today's visit, he reports feeling poorly in light of everything going on with his health.   He continues to receive weekly paracentesis and follow closely with hepatology as well as nephrology at [facility].  He feels physically and emotionally worn down by his health conditions.  He was started on Retacrit injections after his last visit, reports that he has been tolerating these well.  Blood pressure has been at baseline, and there are no signs of DVT/PE.   He reports significant fatigue.  He continues to report ascites, bilateral leg swelling, and scrotal edema secondary to his liver cirrhosis.   No recent dyspnea, chest pain, lightheadedness, or syncope.   He reports easy bruising and occasional petechiae of bilateral legs.  He reports that his gums occasionally bleed when he brushes his teeth and he has scant dried blood/epistaxis when he wakes in the morning.  He bleeds easily when cut.  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 He reports a bleeding hemorrhoid 3 weeks ago, but denies any other hematochezia melena, or hematuria.  He has not noticed any new lumps or bumps.  He denies any fevers, chills, or night sweats. Baseline weight difficult to assess due to fluctuating fluid and frequent paracentesis, but patient reports that  overall he has been losing weight and muscle mass since diagnosed with cirrhosis. He has little to no energy and 25% appetite.  ASSESSMENT & PLAN:  1.   Thrombocytopenia and leukopenia, secondary to cirrhosis and splenomegaly - Platelet count has slowly down trended in the past 5 years.  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 - CT scan (11/14/2021 at [facility]) showed enlarged spleen measuring 17 cm - Work-up negative for other causes of thrombocytopenia - normal B12, methylmalonic acid, folate, copper; negative rheumatoid factor/ANA; no abnormal platelets noted on pathology smear review - Received platelet transfusion x1 on 08/18/2021 prior to prostate biopsy.  Blood transfusion x1 on 01/15/2022 prior to prostate SpaceOAR placement - Bone marrow biopsy (02/10/2022) shows hypercellular marrow for age with trilineage lineage hematopoiesis.  No increase in blasts.  No evidence of metastatic carcinoma. - Most recent CBC (11/20/2022): Platelets 61, WBC 2.8, decreased lymphocytes 0.6 - DIFFERENTIAL DIAGNOSIS: Thrombocytopenia and leukopenia likely secondary to cirrhosis and splenic sequestration.  Unclear if patient has any underlying ITP in addition to this. - PLAN: No indication for treatment at this time.  We will continue to monitor closely with repeat CBC and RTC in 12 weeks.     - Due to questionable history of ITP, could also consider trial of steroids before pursuing splenic artery  embolization.  If platelets count is between 30,000-40,000, would consider trial of pulsed dexamethasone 40 mg x 4 days to see if he gets good response. - May benefit from splenic artery embolization in the future. Prior to any splenic intervention,  though, would discuss with [facility] hepatologist      2.  Normocytic anemia, secondary to CKD + iron deficiency  - Bone marrow biopsy (02/10/2022) shows hypercellular marrow for age with trilineage lineage hematopoiesis.  No increase in blasts.  No evidence of metastatic carcinoma. - EGDs at [facility] in April 2023 in October 2023 showed esophageal varices (s/p banding), old blood in the stomach, and portal hypertensive gastropathy  - Other work-up (08/18/2021) showed normal reticulocytes; normal B12, methylmalonic acid, folate, and copper.  Immunofixation and SPEP negative.  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 [name] - He received IV Venofer x1000 mg, last dose given 09/03/2021 - Reports PRBC transfusion x2 during hospitalization at [facility] in June/July 2023 - Retacrit 10,000 units every 2 weeks started 10/22/2022 - Most recent labs (11/20/2022):  CBC: Hgb 8.4/MCV 100.8, platelets 61, WBC 2.8 with decreased lymphocytes 0.4 Creatinine 1.97/GFR 38 (CKD stage IIIb) Normal LDH. Ferritin 89, iron saturation 22%.  Low TIBC 191, low serum iron 41. - DIFFERENTIAL DIAGNOSIS: Anemia secondary to CKD, further complicated by malabsorption (celiac disease), chronic GI bleeding - PLAN: IV Venofer 300 mg x 2 (alternating weeks from Retacrit) - per patient request will check with [facility] liver transplant team (Dr. Angela Adam) prior to his scheduled IV iron - Increase Retacrit to Retacrit 20,000 units every 2 weeks for anemia in the setting of CKD stage IIIb.   - Labs (CBC/D, CMP, ferritin, iron/TIBC) and office visit in 8 weeks   3. Weight loss - Baseline weight difficult to assess due to fluctuating fluid and frequent paracentesis, but patient reports that overall he has been losing weight and muscle mass since diagnosed with cirrhosis.   4.  Cirrhosis with esophageal varices - Diagnosed in 2017 -  Patient had EGD/colonoscopy on 07/31/2021 which showed portal colopathy as well as gastric erosions and grade 3 varices, which were not banded due to low platelet count - EGD (12/26/2021 via [facility]): Grade 2 esophageal varices, banded x3; old blood noted in stomach; mild portal hypertensive gastropathy - Patient follows with hepatology at [facility] and is being evaluated for possible liver/kidney transplant   5.  Stage T1c adenocarcinoma of the prostate, Gleason 3+4   - Diagnosed in October 2022 with prostate biopsy after abnormal PSA (8.2) - Following with urologist (Dr. Junious Silk / Dr. Alyson Ingles) and has also been seen by radiation oncology (Ashlyn Bruning PA-C / Dr. Tyler Pita) - He had SpaceOAR placement on 01/15/2022 - Patient is undergoing XRT in [place], which was completed on 03/13/2022. - He is receiving Lupron shots and following with urology and radiation oncology  PLAN SUMMARY: >> Venofer 300 mg x 2 (alternating weeks from Retacrit injections) >> CBC + Retacrit every 2 weeks (starting THIS week) >> Same-day labs (CBC/D, CMP, ferritin, iron/TIBC) + OFFICE VISIT + injection in 8 weeks    REVIEW OF SYSTEMS:   Review of Systems  Constitutional:  Positive for fatigue. Negative for appetite change, chills, diaphoresis, fever and unexpected weight change.  HENT:   Negative for lump/mass and nosebleeds.   Eyes:  Negative for eye problems.  Respiratory:  Positive for cough. Negative for hemoptysis and shortness of breath.  Cardiovascular:  Negative for chest pain, leg swelling and palpitations.  Gastrointestinal:  Positive for abdominal distention. Negative for abdominal pain, blood in stool, constipation, diarrhea, nausea and vomiting.  Genitourinary:  Negative for hematuria.   Skin: Negative.   Neurological:  Negative for dizziness, headaches and light-headedness.  Hematological:  Does not bruise/bleed easily.  Psychiatric/Behavioral:  Positive for depression and sleep disturbance.       PHYSICAL EXAM:  ECOG PERFORMANCE STATUS: 2 - Symptomatic, <50% confined to bed  There were no vitals filed for this visit. There were no vitals filed for this visit. Physical Exam Constitutional:      Appearance: Normal appearance. He is obese.  HENT:     Head: Normocephalic and atraumatic.     Mouth/Throat:     Mouth: Mucous membranes are moist.  Eyes:     Extraocular Movements: Extraocular movements intact.     Pupils: Pupils are equal, round, and reactive to light.  Cardiovascular:     Rate and Rhythm: Normal rate and regular rhythm.     Pulses: Normal pulses.     Heart sounds: Murmur heard.  Pulmonary:     Effort: Pulmonary effort is normal.     Breath sounds: Rales present.  Abdominal:     General: Bowel sounds are normal. There is distension.     Palpations: Abdomen is soft. There is hepatomegaly.     Tenderness: There is no abdominal tenderness.  Musculoskeletal:        General: No swelling.     Right lower leg: Edema (2+) present.     Left lower leg: Edema (2+) present.  Lymphadenopathy:     Cervical: No cervical adenopathy.  Skin:    General: Skin is warm and dry.     Comments: Spider angiomas and telangiectasias on face and neck  Neurological:     General: No focal deficit present.     Mental Status: He is alert and oriented to person, place, and time.  Psychiatric:        Mood and Affect: Mood normal.        Behavior: Behavior normal.     PAST MEDICAL/SURGICAL HISTORY:  Past Medical History:  Diagnosis Date   Allergy    nose and sinus problems   Anemia    Asthma    Celiac disease    Cirrhosis, cryptogenic (HCC)    completed Hep A and B vaccines   DM (diabetes mellitus) ([place])    Encounter for general adult medical examination with abnormal findings 09/08/2022   GERD (gastroesophageal reflux disease)    Hyperlipidemia    diet controlled now with 100 pound weight loss   Hypertension    Iron deficiency anemia due to chronic blood loss 08/13/2021    Malignant neoplasm of prostate ([place]) 10/03/2021   Murmur, heart 03/02/2022   Narrow angle glaucoma suspect of both eyes    Renal cyst 09/03/2017   right   Situational depression 07/06/2017   Splenomegaly    Thrombocytopenia ([place])    hematology, ?related to chol med (tricor) and glimeripide, just being monitored, above 100,000   Past Surgical History:  Procedure Laterality Date   BIOPSY  07/09/2016   Procedure: BIOPSY;  Surgeon: [name], MD;  [facility];  Service: Endoscopy;;  duodenal, gastric, esophaageal   BIOPSY  07/31/2021   Procedure: BIOPSY;  Surgeon: [name], MD;  [facility];  Service: Endoscopy;;   CLOSED MANIPULATION SHOULDER     COLONOSCOPY  11/2015   Dr. Posey Pronto: cecal bx neg for microscopic colitis. ascending colon polyp (adenomatous)   COLONOSCOPY WITH PROPOFOL N/A 07/31/2021   Procedure: COLONOSCOPY WITH PROPOFOL;  Surgeon: [name], MD;  [facility];  Service: Endoscopy;  Laterality: N/A;  7:30am   ESOPHAGEAL BANDING N/A 08/04/2017   Procedure: ESOPHAGEAL BANDING;  Surgeon: [name], MD;  [facility];  Service: Endoscopy;  Laterality: N/A;  esophageal varices banding   ESOPHAGEAL BANDING N/A 10/13/2017   Procedure: ESOPHAGEAL BANDING;  Surgeon: [name], MD;  [facility];  Service: Endoscopy;  Laterality: N/A;   ESOPHAGEAL BANDING N/A 04/09/2020   Procedure: ESOPHAGEAL BANDING;  Surgeon: [name], MD;  [facility];  Service: Endoscopy;  Laterality: N/A;   ESOPHAGOGASTRODUODENOSCOPY N/A 07/09/2016   Procedure: ESOPHAGOGASTRODUODENOSCOPY (EGD);  Surgeon: [name], MD;  [facility];  Service: Endoscopy;  Laterality: N/A;  730   ESOPHAGOGASTRODUODENOSCOPY N/A 08/04/2017   Procedure: ESOPHAGOGASTRODUODENOSCOPY (EGD);  Surgeon: [name], MD;  [facility];  Service: Endoscopy;  Laterality: N/A;  7:30am   ESOPHAGOGASTRODUODENOSCOPY N/A  10/13/2017   Dr. [name]: grade 2 esophageal varices status post banding, portal hypertensive gastropathy   ESOPHAGOGASTRODUODENOSCOPY  11/23/2019   [name]: Esophageal varices, 4 columns of grade 2-3 status post banding.  Varices more prominent than seen in December 2018.  Portal gastropathy.  Normal-appearing small bowel.   ESOPHAGOGASTRODUODENOSCOPY (EGD) WITH PROPOFOL N/A 04/09/2020   Dr. [name]: 4 columns of grade 2/grade 3 esophageal varices somewhat recalcitrant.  Status post esophageal band ligation today.  Portal gastropathy.  Normal-appearing small bowel.   ESOPHAGOGASTRODUODENOSCOPY (EGD) WITH PROPOFOL N/A 07/31/2021   Procedure: ESOPHAGOGASTRODUODENOSCOPY (EGD) WITH PROPOFOL;  Surgeon: [name], MD;  [facility];  Service: Endoscopy;  Laterality: N/A;   GOLD SEED IMPLANT N/A 01/15/2022   Procedure: GOLD SEED IMPLANT;  Surgeon: Cleon Gustin, MD;  Location: [facility];  Service: Urology;  Laterality: N/A;   SPACE OAR INSTILLATION N/A 01/15/2022   Procedure: SPACE OAR INSTILLATION;  Surgeon: Cleon Gustin, MD;  Location: [facility];  Service: Urology;  Laterality: N/A;   US PARACENTESIS  06/10/2022    SOCIAL HISTORY:  Social History   Socioeconomic History   Marital status: Married    Spouse name: Melissa   Number of children: 0   Years of education: 14   Highest education level: Not on file  Occupational History   Occupation: Customer service manager, [place], travels  Tobacco Use   Smoking status: Never   Smokeless tobacco: Never  Vaping Use   Vaping Use: Never used  Substance and Sexual Activity   Alcohol use: Not Currently   Drug use: No   Sexual activity: Not Currently  Other Topics Concern   Not on file  Social History Narrative   Lives at home with wife Melissa   One dog.   Has no children.   Eats all food groups.   Works for [employer] and [employer].       Social Determinants of Health   Financial Resource Strain: Not  on file  Food Insecurity: Not on file  Transportation Needs: Not on file  Physical Activity: Not on file  Stress: Not on file  Social Connections: Not on file  Intimate Partner Violence: Not on file    FAMILY HISTORY:  Family History  Problem Relation Age of Onset   Other Mother  chronic diarrhea   COPD Mother    Heart disease Mother        died at 19   Arthritis Mother    Cancer Mother    Depression Mother    Diabetes Mother    Hyperlipidemia Mother    Hypertension Mother    Stroke Mother    Arthritis Father    Asthma Father    Birth defects Father    Heart disease Father        aortic valve replaced   Heart disease Maternal Grandmother    Stroke Maternal Grandmother    Heart disease Maternal Grandfather    Stroke Maternal Grandfather    Diabetes Paternal Grandmother    Stroke Paternal Grandfather    Heart disease Paternal Grandfather    Liver disease Neg Hx    Colon cancer Neg Hx     CURRENT MEDICATIONS:  Outpatient Encounter Medications as of 11/23/2022  Medication Sig   acetaminophen (TYLENOL) 325 MG tablet Take 650 mg by mouth every 6 (six) hours as needed for moderate pain.   BD PEN NEEDLE NANO 2ND GEN 32G X 4 MM MISC USE TO INJECT INSULIN 4 TIMES DAILY E11.65   BENADRYL ITCH STOPPING 2 % GEL Apply topically.   Cholecalciferol (VITAMIN D3) 125 MCG (5000 UT) TABS Take 5,000 Units by mouth in the morning.   ciprofloxacin (CIPRO) 500 MG tablet Take 500 mg by mouth daily.   Continuous Blood Gluc Receiver (FREESTYLE LIBRE 2 READER) DEVI USE AS DIRECTED TO TEST GLUCOSE LEVELS AT LEAST 4 TIMES DAILY E11.9   Continuous Blood Gluc Sensor (FREESTYLE LIBRE 2 SENSOR) MISC USE AS DIRECTED FOR BLOOD SUGAR MONITORING. CHANGE EVERY 14 DAYS   folic acid (FOLVITE) 0.5 MG tablet Take 0.5 mg by mouth daily.   Glucagon (GVOKE HYPOPEN 2-PACK) 1 MG/0.2ML SOAJ INJECT 1 MG AS NEEDED BY SUBCUTANEOUS ROUTE.   hydrocortisone (ANUSOL-HC) 25 MG suppository Place 1 suppository (25 mg  total) rectally 2 (two) times daily as needed for hemorrhoids or anal itching.   LEVEMIR FLEXTOUCH 100 UNIT/ML Pen Inject 28 Units into the skin at bedtime.   Multiple Vitamin (MULTIVITAMIN WITH MINERALS) TABS tablet Take 1 tablet by mouth in the morning.   nateglinide (STARLIX) 60 MG tablet Take 1 tablet by mouth 3 (three) times daily.   oxyCODONE (ROXICODONE) 5 MG immediate release tablet Take 1 tablet (5 mg total) by mouth every 6 (six) hours as needed for severe pain.   No facility-administered encounter medications on file as of 11/23/2022.    ALLERGIES:  Allergies  Allergen Reactions   Penicillins Other (See Comments)    As a baby, unknown reaction Did it involve swelling of the face/tongue/throat, SOB, or low BP? Unknown Did it involve sudden or severe rash/hives, skin peeling, or any reaction on the inside of your mouth or nose? Unknown Did you need to seek medical attention at a hospital or doctor's office? Unknown When did it last happen?      infant allergy If all above answers are "NO", may proceed with cephalosporin use. .    Gluten Meal Diarrhea   Biaxin [Clarithromycin] Tinitus    Ears ringing, loss sense of smell   Prednisone Hives    Made blood sugar high    LABORATORY DATA:  I have reviewed the labs as listed.  CBC    Component Value Date/Time   WBC 2.8 (L) 11/20/2022 1241   RBC 2.53 (L) 11/20/2022 1241   HGB 8.4 (L) 11/20/2022 1241  HGB 11.1 (L) 09/02/2021 1655   HCT 25.5 (L) 11/20/2022 1241   HCT 32.9 (L) 09/02/2021 1655   PLT 61 (L) 11/20/2022 1241   PLT 43 (LL) 09/02/2021 1655   MCV 100.8 (H) 11/20/2022 1241   MCV 96 09/02/2021 1655   MCH 33.2 11/20/2022 1241   MCHC 32.9 11/20/2022 1241   RDW 14.6 11/20/2022 1241   RDW 14.0 09/02/2021 1655   LYMPHSABS 0.4 (L) 11/20/2022 1241   LYMPHSABS 1.2 09/02/2021 1655   MONOABS 0.3 11/20/2022 1241   EOSABS 0.3 11/20/2022 1241   EOSABS 0.3 09/02/2021 1655   BASOSABS 0.0 11/20/2022 1241   BASOSABS 0.0  09/02/2021 1655      Latest Ref Rng & Units 11/20/2022   12:41 PM 09/14/2022    1:18 PM 07/06/2022    8:21 PM  CMP  Glucose 70 - 99 mg/dL 191  94  153   BUN 8 - 23 mg/dL 65  65  53   Creatinine 0.61 - 1.24 mg/dL 1.97  1.96  1.64   Sodium 135 - 145 mmol/L 134  138  137   Potassium 3.5 - 5.1 mmol/L 4.1  4.0  4.3   Chloride 98 - 111 mmol/L 109  112  112   CO2 22 - 32 mmol/L '19  20  20   '$ Calcium 8.9 - 10.3 mg/dL 8.0  8.3  8.2   Total Protein 6.5 - 8.1 g/dL 5.6  6.3    Total Bilirubin 0.3 - 1.2 mg/dL 1.2  1.1    Alkaline Phos 38 - 126 U/L 148  135    AST 15 - 41 U/L 58  57    ALT 0 - 44 U/L 40  34      DIAGNOSTIC IMAGING:  I have independently reviewed the relevant imaging and discussed with the patient.   WRAP UP:  All questions were answered. The patient knows to call the clinic with any problems, questions or concerns.  Medical decision making: Moderate  Time spent on visit: I spent 25 minutes counseling the patient face to face. The total time spent in the appointment was 40 minutes and more than 50% was on counseling.  Harriett Rush, PA-C  11/23/22 9:04 AM

## 2022-11-23 ENCOUNTER — Inpatient Hospital Stay (HOSPITAL_BASED_OUTPATIENT_CLINIC_OR_DEPARTMENT_OTHER): Payer: BC Managed Care – PPO | Admitting: Physician Assistant

## 2022-11-23 ENCOUNTER — Telehealth: Payer: Self-pay

## 2022-11-23 ENCOUNTER — Encounter: Payer: Self-pay | Admitting: Physician Assistant

## 2022-11-23 ENCOUNTER — Other Ambulatory Visit: Payer: Self-pay

## 2022-11-23 ENCOUNTER — Other Ambulatory Visit (HOSPITAL_COMMUNITY): Payer: Self-pay | Admitting: Unknown Physician Specialty

## 2022-11-23 VITALS — BP 124/53 | HR 78 | Temp 98.0°F | Resp 20 | Wt 198.6 lb

## 2022-11-23 DIAGNOSIS — D500 Iron deficiency anemia secondary to blood loss (chronic): Secondary | ICD-10-CM

## 2022-11-23 DIAGNOSIS — D631 Anemia in chronic kidney disease: Secondary | ICD-10-CM

## 2022-11-23 DIAGNOSIS — D696 Thrombocytopenia, unspecified: Secondary | ICD-10-CM

## 2022-11-23 DIAGNOSIS — D61818 Other pancytopenia: Secondary | ICD-10-CM

## 2022-11-23 DIAGNOSIS — I129 Hypertensive chronic kidney disease with stage 1 through stage 4 chronic kidney disease, or unspecified chronic kidney disease: Secondary | ICD-10-CM | POA: Diagnosis not present

## 2022-11-23 DIAGNOSIS — N1832 Chronic kidney disease, stage 3b: Secondary | ICD-10-CM | POA: Diagnosis not present

## 2022-11-23 DIAGNOSIS — D649 Anemia, unspecified: Secondary | ICD-10-CM

## 2022-11-23 HISTORY — DX: Anemia in chronic kidney disease: D63.1

## 2022-11-23 NOTE — Telephone Encounter (Signed)
Spoke with the [place] [facility] team nurse, Tinnie Gens, RN on the phone and reviewed request to give the patient IV Venofer and needing clearance for the patient to receive the medication.  Per Tinnie Gens, RN it is ok to give the patient IV Venofer.    Dr. Era [facility] Transplant [phone]

## 2022-11-23 NOTE — Patient Instructions (Signed)
[place] at [facility] Discharge Instructions  You were seen today by Tarri Abernethy PA-C for your anemia and low platelets.  ANEMIA:  You continue to have moderate to severe anemia, with hemoglobin ranging from 8.0-9.0. We will schedule you for Retacrit every 2 weeks (increasing to 20,000 units each dose). Will schedule you for IV iron x 2 doses (every other week), but will clear this with your liver transplant team before administering the medication.  LOW PLATELETS: Your platelets remain low secondary to your cirrhosis and enlarged spleen.  This places you at increased risk of bleeding and bruising but does not require any specific treatment at this time.  Continue to follow closely with your hepatologist and nephrologist.  Seek IMMEDIATE medical attention if you have any bloody vomit, black tarry bowel movements, or bright red blood in the toilet, or any other signs of uncontrolled bleeding.  FOLLOW-UP APPOINTMENT: Same-day labs, injection, and office visit in 8 weeks  ** Thank you for trusting me with your healthcare!  I strive to provide all of my patients with quality care at each visit.  If you receive a survey for this visit, I would be so grateful to you for taking the time to provide feedback.  Thank you in advance!  ~ Brittany Osier                   Dr. Derek Jack   &   Tarri Abernethy, PA-C   - - - - - - - - - - - - - - - - - -     Thank you for choosing [place] at [facility] to provide your oncology and hematology care.  To afford each patient quality time with our provider, please arrive at least 15 minutes before your scheduled appointment time.   If you have a lab appointment with the [place] please come in thru the Main Entrance and check in at the main information desk.  You need to re-schedule your appointment should you arrive 10 or more minutes late.  We strive to give you quality time with our  providers, and arriving late affects you and other patients whose appointments are after yours.  Also, if you no show three or more times for appointments you may be dismissed from the clinic at the providers discretion.     Again, thank you for choosing [facility].  Our hope is that these requests will decrease the amount of time that you wait before being seen by our physicians.       _____________________________________________________________  Should you have questions after your visit to [facility], please contact our office at [phone] and follow the prompts.  Our office hours are 8:00 a.m. and 4:30 p.m. Monday - Friday.  Please note that voicemails left after 4:00 p.m. may not be returned until the following business day.  We are closed weekends and major holidays.  You do have access to a nurse 24-7, just call the main number to the clinic [phone] and do not press any options, hold on the line and a nurse will answer the phone.    For prescription refill requests, have your pharmacy contact our office and allow 72 hours.    Due to Covid, you will need to wear a mask upon entering the hospital. If you do not have a mask, a mask will be given to you at the Main Entrance upon arrival.  For doctor visits, patients may have 1 support person age 15 or older with them. For treatment visits, patients can not have anyone with them due to social distancing guidelines and our immunocompromised population.

## 2022-11-23 NOTE — Telephone Encounter (Signed)
-----  Message from Harriett Rush, [place] sent at 11/23/2022  9:10 AM EST ----- Can you get in touch with the office of his liver transplant specialist (Dr. Angela Adam at [facility])?  Hopefully, there is a nurse navigator you can speak to.  We would like to give him IV Venofer 300 mg x 2 doses for treatment of his iron deficiency anemia/functional iron deficiency in the setting of anemia of CKD.  Patient was told that we needed to clear any medications that we give him with his liver transplant team prior to administering them.

## 2022-11-24 ENCOUNTER — Other Ambulatory Visit (HOSPITAL_COMMUNITY): Payer: Self-pay | Admitting: Unknown Physician Specialty

## 2022-11-24 ENCOUNTER — Telehealth: Payer: Self-pay

## 2022-11-24 DIAGNOSIS — R188 Other ascites: Secondary | ICD-10-CM

## 2022-11-24 DIAGNOSIS — K7469 Other cirrhosis of liver: Secondary | ICD-10-CM

## 2022-11-24 NOTE — Telephone Encounter (Signed)
Spoke with the patient and made aware of [facility] teams ok to receive Iv Venofer.

## 2022-11-26 ENCOUNTER — Ambulatory Visit (HOSPITAL_COMMUNITY)
Admission: RE | Admit: 2022-11-26 | Discharge: 2022-11-26 | Disposition: A | Discharge status: Home / Self Care | Payer: BC Managed Care – PPO | Source: Ambulatory Visit | Attending: Unknown Physician Specialty | Admitting: Unknown Physician Specialty

## 2022-11-26 ENCOUNTER — Encounter (HOSPITAL_COMMUNITY): Payer: Self-pay

## 2022-11-26 DIAGNOSIS — R188 Other ascites: Secondary | ICD-10-CM

## 2022-11-26 DIAGNOSIS — K7469 Other cirrhosis of liver: Secondary | ICD-10-CM | POA: Diagnosis present

## 2022-11-26 LAB — BODY FLUID CELL COUNT WITH DIFFERENTIAL
Eos, Fluid: 3 %
Lymphs, Fluid: 45 %
Monocyte-Macrophage-Serous Fluid: 48 % — ABNORMAL LOW (ref 50–90)
Neutrophil Count, Fluid: 4 % (ref 0–25)
Total Nucleated Cell Count, Fluid: 160 cu mm (ref 0–1000)

## 2022-11-26 MED ORDER — ALBUMIN HUMAN 25 % IV SOLN: 50.0000 g | Freq: Once | INTRAVENOUS | Status: AC

## 2022-11-26 MED ORDER — ALBUMIN HUMAN 25 % IV SOLN
INTRAVENOUS | Status: AC
  Administered 2022-11-26: 50 g via INTRAVENOUS
  Filled 2022-11-26: qty 200

## 2022-11-26 NOTE — Progress Notes (Signed)
Patient tolerated right sided paracentesis procedure and 50G of IV albumin well today and 5 Liters of cloudy yellow ascites removed with labs collected and sent for processing. PT verbalized understanding of discharge instructions and ambulatory at departure with no acute distress noted.

## 2022-11-26 NOTE — Procedures (Signed)
   Recurrent ascites US guided LLQ paracentesis  5 liters (max per MD) Yellow fluid Sent for labs  Tolerated well  EBL: scant

## 2022-11-27 ENCOUNTER — Inpatient Hospital Stay: Payer: BC Managed Care – PPO

## 2022-11-27 ENCOUNTER — Other Ambulatory Visit: Payer: Self-pay

## 2022-11-27 VITALS — BP 128/50 | HR 79 | Temp 98.2°F | Resp 18

## 2022-11-27 DIAGNOSIS — I129 Hypertensive chronic kidney disease with stage 1 through stage 4 chronic kidney disease, or unspecified chronic kidney disease: Secondary | ICD-10-CM | POA: Diagnosis not present

## 2022-11-27 DIAGNOSIS — D631 Anemia in chronic kidney disease: Secondary | ICD-10-CM

## 2022-11-27 DIAGNOSIS — D500 Iron deficiency anemia secondary to blood loss (chronic): Secondary | ICD-10-CM

## 2022-11-27 LAB — CBC
HCT: 25.4 % — ABNORMAL LOW (ref 39.0–52.0)
Hemoglobin: 8.4 g/dL — ABNORMAL LOW (ref 13.0–17.0)
MCH: 32.7 pg (ref 26.0–34.0)
MCHC: 33.1 g/dL (ref 30.0–36.0)
MCV: 98.8 fL (ref 80.0–100.0)
Platelets: 61 10*3/uL — ABNORMAL LOW (ref 150–400)
RBC: 2.57 MIL/uL — ABNORMAL LOW (ref 4.22–5.81)
RDW: 15 % (ref 11.5–15.5)
WBC: 2.8 10*3/uL — ABNORMAL LOW (ref 4.0–10.5)
nRBC: 0 % (ref 0.0–0.2)

## 2022-11-27 MED ORDER — EPOETIN ALFA-EPBX 20000 UNIT/ML IJ SOLN
20000.0000 [IU] | Freq: Once | INTRAMUSCULAR | Status: AC
  Administered 2022-11-27: 20000 [IU] via SUBCUTANEOUS
  Filled 2022-11-27: qty 1

## 2022-11-27 NOTE — Progress Notes (Signed)
Michael Harmon presents today for injection per the provider's orders.  Retacrit administration without incident; injection site WNL; see MAR for injection details.  Patient tolerated procedure well and without incident. Patient remained stable throughout visit. No questions or complaints noted at this time. Patient discharged ambulatory and in stable condition.

## 2022-11-27 NOTE — Patient Instructions (Signed)
[facility]  Discharge Instructions: Thank you for choosing [facility] to provide your oncology and hematology care.  If you have a lab appointment with the Cancer Center, please come in thru the Main Entrance and check in at the main information desk.  Wear comfortable clothing and clothing appropriate for easy access to any Portacath or PICC line.   We strive to give you quality time with your provider. You may need to reschedule your appointment if you arrive late (15 or more minutes).  Arriving late affects you and other patients whose appointments are after yours.  Also, if you miss three or more appointments without notifying the office, you may be dismissed from the clinic at the provider's discretion.      For prescription refill requests, have your pharmacy contact our office and allow 72 hours for refills to be completed.    Today you received the following chemotherapy and/or immunotherapy agents Retacrit      To help prevent nausea and vomiting after your treatment, we encourage you to take your nausea medication as directed.  BELOW ARE SYMPTOMS THAT SHOULD BE REPORTED IMMEDIATELY: *FEVER GREATER THAN 100.4 F (38 C) OR HIGHER *CHILLS OR SWEATING *NAUSEA AND VOMITING THAT IS NOT CONTROLLED WITH YOUR NAUSEA MEDICATION *UNUSUAL SHORTNESS OF BREATH *UNUSUAL BRUISING OR BLEEDING *URINARY PROBLEMS (pain or burning when urinating, or frequent urination) *BOWEL PROBLEMS (unusual diarrhea, constipation, pain near the anus) TENDERNESS IN MOUTH AND THROAT WITH OR WITHOUT PRESENCE OF ULCERS (sore throat, sores in mouth, or a toothache) UNUSUAL RASH, SWELLING OR PAIN  UNUSUAL VAGINAL DISCHARGE OR ITCHING   Items with * indicate a potential emergency and should be followed up as soon as possible or go to the Emergency Department if any problems should occur.  Please show the CHEMOTHERAPY ALERT CARD or IMMUNOTHERAPY ALERT CARD at check-in to the  Emergency Department and triage nurse.  Should you have questions after your visit or need to cancel or reschedule your appointment, please contact [facility] [phone]  and follow the prompts.  Office hours are 8:00 a.m. to 4:30 p.m. Monday - Friday. Please note that voicemails left after 4:00 p.m. may not be returned until the following business day.  We are closed weekends and major holidays. You have access to a nurse at all times for urgent questions. Please call the main number to the clinic [phone] and follow the prompts.  For any non-urgent questions, you may also contact your provider using MyChart. We now offer e-Visits for anyone 18 and older to request care online for non-urgent symptoms. For details visit [email or web address].   Also download the MyChart app! Go to the app store, search "MyChart", open the app, select [facility], and log in with your MyChart username and password.   

## 2022-12-03 ENCOUNTER — Ambulatory Visit (HOSPITAL_COMMUNITY): Admission: RE | Admit: 2022-12-03 | Payer: BC Managed Care – PPO | Source: Ambulatory Visit

## 2022-12-04 ENCOUNTER — Ambulatory Visit (HOSPITAL_COMMUNITY)
Admission: RE | Admit: 2022-12-04 | Discharge: 2022-12-04 | Disposition: A | Discharge status: Home / Self Care | Payer: BC Managed Care – PPO | Source: Ambulatory Visit | Attending: Unknown Physician Specialty | Admitting: Unknown Physician Specialty

## 2022-12-04 ENCOUNTER — Inpatient Hospital Stay: Payer: BC Managed Care – PPO | Attending: Physician Assistant

## 2022-12-04 ENCOUNTER — Encounter (HOSPITAL_COMMUNITY): Payer: Self-pay

## 2022-12-04 VITALS — BP 114/50 | HR 79 | Temp 97.7°F | Resp 18

## 2022-12-04 DIAGNOSIS — D509 Iron deficiency anemia, unspecified: Secondary | ICD-10-CM | POA: Insufficient documentation

## 2022-12-04 DIAGNOSIS — K7469 Other cirrhosis of liver: Secondary | ICD-10-CM | POA: Insufficient documentation

## 2022-12-04 DIAGNOSIS — I129 Hypertensive chronic kidney disease with stage 1 through stage 4 chronic kidney disease, or unspecified chronic kidney disease: Secondary | ICD-10-CM | POA: Diagnosis not present

## 2022-12-04 DIAGNOSIS — D500 Iron deficiency anemia secondary to blood loss (chronic): Secondary | ICD-10-CM

## 2022-12-04 DIAGNOSIS — R188 Other ascites: Secondary | ICD-10-CM | POA: Diagnosis present

## 2022-12-04 DIAGNOSIS — D631 Anemia in chronic kidney disease: Secondary | ICD-10-CM | POA: Insufficient documentation

## 2022-12-04 DIAGNOSIS — N1832 Chronic kidney disease, stage 3b: Secondary | ICD-10-CM | POA: Diagnosis not present

## 2022-12-04 LAB — BODY FLUID CELL COUNT WITH DIFFERENTIAL
Eos, Fluid: 1 %
Lymphs, Fluid: 59 %
Monocyte-Macrophage-Serous Fluid: 40 % — ABNORMAL LOW (ref 50–90)
Neutrophil Count, Fluid: 0 % (ref 0–25)
Total Nucleated Cell Count, Fluid: 163 cu mm (ref 0–1000)

## 2022-12-04 MED ORDER — CETIRIZINE HCL 10 MG PO TABS
10.0000 mg | ORAL_TABLET | Freq: Once | ORAL | Status: AC
  Administered 2022-12-04: 10 mg via ORAL
  Filled 2022-12-04: qty 1

## 2022-12-04 MED ORDER — ALBUMIN HUMAN 25 % IV SOLN: 50.0000 g | Freq: Once | INTRAVENOUS | Status: AC

## 2022-12-04 MED ORDER — SODIUM CHLORIDE 0.9 % IV SOLN
300.0000 mg | Freq: Once | INTRAVENOUS | Status: AC
  Administered 2022-12-04: 300 mg via INTRAVENOUS
  Filled 2022-12-04: qty 5

## 2022-12-04 MED ORDER — ALBUMIN HUMAN 25 % IV SOLN
INTRAVENOUS | Status: AC
  Administered 2022-12-04: 50 g via INTRAVENOUS
  Filled 2022-12-04: qty 200

## 2022-12-04 MED ORDER — LORATADINE 10 MG PO TABS: 10.0000 mg | ORAL_TABLET | Freq: Once | ORAL | Status: DC

## 2022-12-04 MED ORDER — SODIUM CHLORIDE 0.9 % IV SOLN: Freq: Once | INTRAVENOUS | Status: AC

## 2022-12-04 NOTE — Patient Instructions (Signed)
[facility]  Discharge Instructions: Thank you for choosing [facility] to provide your oncology and hematology care.  If you have a lab appointment with the Cancer Center, please come in thru the Main Entrance and check in at the main information desk.  Wear comfortable clothing and clothing appropriate for easy access to any Portacath or PICC line.   We strive to give you quality time with your provider. You may need to reschedule your appointment if you arrive late (15 or more minutes).  Arriving late affects you and other patients whose appointments are after yours.  Also, if you miss three or more appointments without notifying the office, you may be dismissed from the clinic at the provider's discretion.      For prescription refill requests, have your pharmacy contact our office and allow 72 hours for refills to be completed.    Iron Sucrose Injection What is this medication? IRON SUCROSE (EYE ern SOO krose) treats low levels of iron (iron deficiency anemia) in people with kidney disease. Iron is a mineral that plays an important role in making red blood cells, which carry oxygen from your lungs to the rest of your body. This medicine may be used for other purposes; ask your health care provider or pharmacist if you have questions. COMMON BRAND NAME(S): Venofer What should I tell my care team before I take this medication? They need to know if you have any of these conditions: Anemia not caused by low iron levels Heart disease High levels of iron in the blood Kidney disease Liver disease An unusual or allergic reaction to iron, other medications, foods, dyes, or preservatives Pregnant or trying to get pregnant Breastfeeding How should I use this medication? This medication is for infusion into a vein. It is given in a hospital or clinic setting. Talk to your care team about the use of this medication in children. While this medication may be  prescribed for children as young as 2 years for selected conditions, precautions do apply. Overdosage: If you think you have taken too much of this medicine contact a poison control center or emergency room at once. NOTE: This medicine is only for you. Do not share this medicine with others. What if I miss a dose? Keep appointments for follow-up doses. It is important not to miss your dose. Call your care team if you are unable to keep an appointment. What may interact with this medication? Do not take this medication with any of the following: Deferoxamine Dimercaprol Other iron products This medication may also interact with the following: Chloramphenicol Deferasirox This list may not describe all possible interactions. Give your health care provider a list of all the medicines, herbs, non-prescription drugs, or dietary supplements you use. Also tell them if you smoke, drink alcohol, or use illegal drugs. Some items may interact with your medicine. What should I watch for while using this medication? Visit your care team regularly. Tell your care team if your symptoms do not start to get better or if they get worse. You may need blood work done while you are taking this medication. You may need to follow a special diet. Talk to your care team. Foods that contain iron include: whole grains/cereals, dried fruits, beans, or peas, leafy green vegetables, and organ meats (liver, kidney). What side effects may I notice from receiving this medication? Side effects that you should report to your care team as soon as possible: Allergic reactions--skin rash, itching, hives,   swelling of the face, lips, tongue, or throat Low blood pressure--dizziness, feeling faint or lightheaded, blurry vision Shortness of breath Side effects that usually do not require medical attention (report to your care team if they continue or are bothersome): Flushing Headache Joint pain Muscle pain Nausea Pain, redness, or  irritation at injection site This list may not describe all possible side effects. Call your doctor for medical advice about side effects. You may report side effects to FDA at 1-800-FDA-1088. Where should I keep my medication? This medication is given in a hospital or clinic and will not be stored at home. NOTE: This sheet is a summary. It may not cover all possible information. If you have questions about this medicine, talk to your doctor, pharmacist, or health care provider.  2023 Elsevier/Gold Standard (2021-01-30 00:00:00)    To help prevent nausea and vomiting after your treatment, we encourage you to take your nausea medication as directed.  BELOW ARE SYMPTOMS THAT SHOULD BE REPORTED IMMEDIATELY: *FEVER GREATER THAN 100.4 F (38 C) OR HIGHER *CHILLS OR SWEATING *NAUSEA AND VOMITING THAT IS NOT CONTROLLED WITH YOUR NAUSEA MEDICATION *UNUSUAL SHORTNESS OF BREATH *UNUSUAL BRUISING OR BLEEDING *URINARY PROBLEMS (pain or burning when urinating, or frequent urination) *BOWEL PROBLEMS (unusual diarrhea, constipation, pain near the anus) TENDERNESS IN MOUTH AND THROAT WITH OR WITHOUT PRESENCE OF ULCERS (sore throat, sores in mouth, or a toothache) UNUSUAL RASH, SWELLING OR PAIN  UNUSUAL VAGINAL DISCHARGE OR ITCHING   Items with * indicate a potential emergency and should be followed up as soon as possible or go to the Emergency Department if any problems should occur.  Please show the CHEMOTHERAPY ALERT CARD or IMMUNOTHERAPY ALERT CARD at check-in to the Emergency Department and triage nurse.  Should you have questions after your visit or need to cancel or reschedule your appointment, please contact [facility] [phone]  and follow the prompts.  Office hours are 8:00 a.m. to 4:30 p.m. Monday - Friday. Please note that voicemails left after 4:00 p.m. may not be returned until the following business day.  We are closed weekends and major holidays. You have access to  a nurse at all times for urgent questions. Please call the main number to the clinic [phone] and follow the prompts.  For any non-urgent questions, you may also contact your provider using MyChart. We now offer e-Visits for anyone 18 and older to request care online for non-urgent symptoms. For details visit [email or web address].   Also download the MyChart app! Go to the app store, search "MyChart", open the app, select [facility], and log in with your MyChart username and password.   

## 2022-12-04 NOTE — Progress Notes (Signed)
Venofer 300 mg given today per MD orders. Tolerated infusion without adverse affects. Vital signs stable. No complaints at this time. Discharged from clinic ambulatory in stable condition. Alert and oriented x 3. F/U with [facility] as scheduled.   

## 2022-12-04 NOTE — Procedures (Signed)
PreOperative Dx: Cryptogenic cirrhosis, ascites Postoperative Dx: Cryptogenic cirrhosis, ascites Procedure:   US guided paracentesis Radiologist:  Amany Rando Anesthesia:  10 ml of1% lidocaine Specimen:  5 L of yellow ascitic fluid EBL:   < 1 ml Complications:  None  

## 2022-12-04 NOTE — Progress Notes (Signed)
Patient presents today for iron infusion.  Patient is in satisfactory condition with no complaints voiced.  Vital signs are stable.  IV in left arm by IR.  IV flushed well and good blood return noted. We will proceed with infusion per provider orders.

## 2022-12-04 NOTE — Progress Notes (Signed)
Patient tolerated right sided paracentesis procedure and 50G of IV albumin well today and 5 Liters of clear yellow ascites removed with labs collected and sent for processing. PT verbalized understanding of discharge instructions and ambulatory upon departure with no acute distress noted. Upon discharge from procedure today patient going to [facility] so peripheral IV saline locked and left for use at 1100 appointment. Report given to [place] IV placement.

## 2022-12-10 ENCOUNTER — Other Ambulatory Visit: Payer: Self-pay

## 2022-12-10 ENCOUNTER — Encounter (HOSPITAL_COMMUNITY): Payer: Self-pay

## 2022-12-10 ENCOUNTER — Ambulatory Visit (HOSPITAL_COMMUNITY)
Admission: RE | Admit: 2022-12-10 | Discharge: 2022-12-10 | Disposition: A | Discharge status: Home / Self Care | Payer: BC Managed Care – PPO | Source: Ambulatory Visit | Attending: Unknown Physician Specialty | Admitting: Unknown Physician Specialty

## 2022-12-10 DIAGNOSIS — R188 Other ascites: Secondary | ICD-10-CM | POA: Diagnosis present

## 2022-12-10 DIAGNOSIS — D631 Anemia in chronic kidney disease: Secondary | ICD-10-CM

## 2022-12-10 DIAGNOSIS — K7469 Other cirrhosis of liver: Secondary | ICD-10-CM | POA: Insufficient documentation

## 2022-12-10 LAB — GRAM STAIN

## 2022-12-10 LAB — BODY FLUID CELL COUNT WITH DIFFERENTIAL
Eos, Fluid: 0 %
Lymphs, Fluid: 39 %
Monocyte-Macrophage-Serous Fluid: 58 % (ref 50–90)
Neutrophil Count, Fluid: 3 % (ref 0–25)
Total Nucleated Cell Count, Fluid: 152 cu mm (ref 0–1000)

## 2022-12-10 MED ORDER — ALBUMIN HUMAN 25 % IV SOLN
INTRAVENOUS | Status: AC
  Administered 2022-12-10: 50 g via INTRAVENOUS
  Filled 2022-12-10: qty 200

## 2022-12-10 MED ORDER — ALBUMIN HUMAN 25 % IV SOLN: 50.0000 g | Freq: Once | INTRAVENOUS | Status: AC

## 2022-12-10 NOTE — Progress Notes (Signed)
Patient tolerated left sided paracentesis procedure and 50G of IV albumin well today and 5 Liters of cloudy yellow ascites removed with labs collected and sent to lab for processing. PT verbalized understanding of discharge instructions and ambulatory at departure with no acute distress noted.

## 2022-12-10 NOTE — Procedures (Signed)
   US guided LLQ paracentesis  5 Liters (max) per MD Cloudy yellow Sent for labs  Tolerated well  EBL: none

## 2022-12-11 ENCOUNTER — Inpatient Hospital Stay: Payer: BC Managed Care – PPO

## 2022-12-11 VITALS — BP 113/54 | HR 73 | Temp 99.0°F | Resp 18

## 2022-12-11 DIAGNOSIS — D631 Anemia in chronic kidney disease: Secondary | ICD-10-CM

## 2022-12-11 DIAGNOSIS — D509 Iron deficiency anemia, unspecified: Secondary | ICD-10-CM | POA: Diagnosis not present

## 2022-12-11 DIAGNOSIS — D500 Iron deficiency anemia secondary to blood loss (chronic): Secondary | ICD-10-CM

## 2022-12-11 LAB — CBC
HCT: 25.4 % — ABNORMAL LOW (ref 39.0–52.0)
Hemoglobin: 8.5 g/dL — ABNORMAL LOW (ref 13.0–17.0)
MCH: 33.9 pg (ref 26.0–34.0)
MCHC: 33.5 g/dL (ref 30.0–36.0)
MCV: 101.2 fL — ABNORMAL HIGH (ref 80.0–100.0)
Platelets: 52 10*3/uL — ABNORMAL LOW (ref 150–400)
RBC: 2.51 MIL/uL — ABNORMAL LOW (ref 4.22–5.81)
RDW: 15.5 % (ref 11.5–15.5)
WBC: 3.1 10*3/uL — ABNORMAL LOW (ref 4.0–10.5)
nRBC: 0 % (ref 0.0–0.2)

## 2022-12-11 MED ORDER — EPOETIN ALFA-EPBX 20000 UNIT/ML IJ SOLN
20000.0000 [IU] | Freq: Once | INTRAMUSCULAR | Status: AC
  Administered 2022-12-11: 20000 [IU] via SUBCUTANEOUS
  Filled 2022-12-11: qty 1

## 2022-12-11 NOTE — Progress Notes (Signed)
Michael Harmon presents today for Retacrit injection per the provider's orders.  Stable during administration without incident; injection site WNL; see MAR for injection details.  Patient tolerated procedure well and without incident.  No questions or complaints noted at this time.

## 2022-12-11 NOTE — Patient Instructions (Signed)
[facility]  Discharge Instructions: Thank you for choosing [facility] to provide your oncology and hematology care.  If you have a lab appointment with the [facility], please come in thru the Main Entrance and check in at the main information desk.  Wear comfortable clothing and clothing appropriate for easy access to any Portacath or PICC line.   We strive to give you quality time with your provider. You may need to reschedule your appointment if you arrive late (15 or more minutes).  Arriving late affects you and other patients whose appointments are after yours.  Also, if you miss three or more appointments without notifying the office, you may be dismissed from the clinic at the provider's discretion.      For prescription refill requests, have your pharmacy contact our office and allow 72 hours for refills to be completed.    Today you received the following chemotherapy and/or immunotherapy agents Retacrit      To help prevent nausea and vomiting after your treatment, we encourage you to take your nausea medication as directed.  BELOW ARE SYMPTOMS THAT SHOULD BE REPORTED IMMEDIATELY: *FEVER GREATER THAN 100.4 F (38 C) OR HIGHER *CHILLS OR SWEATING *NAUSEA AND VOMITING THAT IS NOT CONTROLLED WITH YOUR NAUSEA MEDICATION *UNUSUAL SHORTNESS OF BREATH *UNUSUAL BRUISING OR BLEEDING *URINARY PROBLEMS (pain or burning when urinating, or frequent urination) *BOWEL PROBLEMS (unusual diarrhea, constipation, pain near the anus) TENDERNESS IN MOUTH AND THROAT WITH OR WITHOUT PRESENCE OF ULCERS (sore throat, sores in mouth, or a toothache) UNUSUAL RASH, SWELLING OR PAIN  UNUSUAL VAGINAL DISCHARGE OR ITCHING   Items with * indicate a potential emergency and should be followed up as soon as possible or go to the Emergency Department if any problems should occur.  Please show the CHEMOTHERAPY ALERT CARD or IMMUNOTHERAPY ALERT CARD at check-in to the  Emergency Department and triage nurse.  Should you have questions after your visit or need to cancel or reschedule your appointment, please contact [facility] [phone]  and follow the prompts.  Office hours are 8:00 a.m. to 4:30 p.m. Monday - Friday. Please note that voicemails left after 4:00 p.m. may not be returned until the following business day.  We are closed weekends and major holidays. You have access to a nurse at all times for urgent questions. Please call the main number to the clinic [phone] and follow the prompts.  For any non-urgent questions, you may also contact your provider using MyChart. We now offer e-Visits for anyone 77 and older to request care online for non-urgent symptoms. For details visit [email or web address].   Also download the MyChart app! Go to the app store, search "MyChart", open the app, select [facility], and log in with your MyChart username and password.

## 2022-12-15 LAB — CULTURE, BODY FLUID W GRAM STAIN -BOTTLE: Culture: NO GROWTH

## 2022-12-17 ENCOUNTER — Ambulatory Visit (HOSPITAL_COMMUNITY): Admission: RE | Admit: 2022-12-17 | Payer: BC Managed Care – PPO | Source: Ambulatory Visit

## 2022-12-17 ENCOUNTER — Encounter (HOSPITAL_COMMUNITY): Payer: Self-pay

## 2022-12-17 ENCOUNTER — Ambulatory Visit (HOSPITAL_COMMUNITY)
Admission: RE | Admit: 2022-12-17 | Discharge: 2022-12-17 | Disposition: A | Discharge status: Home / Self Care | Payer: BC Managed Care – PPO | Source: Ambulatory Visit | Attending: Unknown Physician Specialty | Admitting: Unknown Physician Specialty

## 2022-12-17 VITALS — BP 125/59 | HR 78 | Temp 97.9°F | Resp 18

## 2022-12-17 DIAGNOSIS — R188 Other ascites: Secondary | ICD-10-CM | POA: Insufficient documentation

## 2022-12-17 DIAGNOSIS — K7469 Other cirrhosis of liver: Secondary | ICD-10-CM | POA: Diagnosis not present

## 2022-12-17 LAB — BODY FLUID CELL COUNT WITH DIFFERENTIAL
Eos, Fluid: 0 %
Lymphs, Fluid: 51 %
Monocyte-Macrophage-Serous Fluid: 49 % — ABNORMAL LOW (ref 50–90)
Neutrophil Count, Fluid: 0 % (ref 0–25)
Total Nucleated Cell Count, Fluid: 190 cu mm (ref 0–1000)

## 2022-12-17 MED ORDER — ALBUMIN HUMAN 25 % IV SOLN: 50.0000 g | Freq: Once | INTRAVENOUS | Status: AC

## 2022-12-17 MED ORDER — ALBUMIN HUMAN 25 % IV SOLN
INTRAVENOUS | Status: AC
  Administered 2022-12-17: 50 g via INTRAVENOUS
  Filled 2022-12-17: qty 200

## 2022-12-17 NOTE — Procedures (Signed)
   Cirrhosis Recurrent ascites  US guided RLQ paracentesis 5 liters max obtained Sent for labs per MD  Tolerated well  EBL: none

## 2022-12-17 NOTE — Progress Notes (Signed)
Patient tolerated right sided paracentesis procedure well today and 50G of IV albumin. 5 Liters of clear yellow ascites removed and labs collected and sent to lab for processing. Pt verbalized understanding of discharge instructions and ambulatory at departure with no acute distress noted.

## 2022-12-18 ENCOUNTER — Inpatient Hospital Stay: Payer: BC Managed Care – PPO

## 2022-12-18 ENCOUNTER — Other Ambulatory Visit (HOSPITAL_COMMUNITY): Payer: BC Managed Care – PPO

## 2022-12-18 VITALS — BP 114/47 | HR 73 | Temp 98.1°F | Resp 18

## 2022-12-18 DIAGNOSIS — D500 Iron deficiency anemia secondary to blood loss (chronic): Secondary | ICD-10-CM

## 2022-12-18 DIAGNOSIS — D509 Iron deficiency anemia, unspecified: Secondary | ICD-10-CM | POA: Diagnosis not present

## 2022-12-18 DIAGNOSIS — D631 Anemia in chronic kidney disease: Secondary | ICD-10-CM

## 2022-12-18 MED ORDER — CETIRIZINE HCL 10 MG PO TABS
10.0000 mg | ORAL_TABLET | Freq: Once | ORAL | Status: AC
  Administered 2022-12-18: 10 mg via ORAL
  Filled 2022-12-18: qty 1

## 2022-12-18 MED ORDER — SODIUM CHLORIDE 0.9 % IV SOLN: Freq: Once | INTRAVENOUS | Status: AC

## 2022-12-18 MED ORDER — SODIUM CHLORIDE 0.9 % IV SOLN
300.0000 mg | Freq: Once | INTRAVENOUS | Status: AC
  Administered 2022-12-18: 300 mg via INTRAVENOUS
  Filled 2022-12-18: qty 300

## 2022-12-18 NOTE — Patient Instructions (Signed)
[facility]  Discharge Instructions: Thank you for choosing [facility] to provide your oncology and hematology care.  If you have a lab appointment with the [facility], please come in thru the Main Entrance and check in at the main information desk.  Wear comfortable clothing and clothing appropriate for easy access to any Portacath or PICC line.   We strive to give you quality time with your provider. You may need to reschedule your appointment if you arrive late (15 or more minutes).  Arriving late affects you and other patients whose appointments are after yours.  Also, if you miss three or more appointments without notifying the office, you may be dismissed from the clinic at the provider's discretion.      For prescription refill requests, have your pharmacy contact our office and allow 72 hours for refills to be completed.    Today you received Venofer IV iron.     BELOW ARE SYMPTOMS THAT SHOULD BE REPORTED IMMEDIATELY: *FEVER GREATER THAN 100.4 F (38 C) OR HIGHER *CHILLS OR SWEATING *NAUSEA AND VOMITING THAT IS NOT CONTROLLED WITH YOUR NAUSEA MEDICATION *UNUSUAL SHORTNESS OF BREATH *UNUSUAL BRUISING OR BLEEDING *URINARY PROBLEMS (pain or burning when urinating, or frequent urination) *BOWEL PROBLEMS (unusual diarrhea, constipation, pain near the anus) TENDERNESS IN MOUTH AND THROAT WITH OR WITHOUT PRESENCE OF ULCERS (sore throat, sores in mouth, or a toothache) UNUSUAL RASH, SWELLING OR PAIN  UNUSUAL VAGINAL DISCHARGE OR ITCHING   Items with * indicate a potential emergency and should be followed up as soon as possible or go to the Emergency Department if any problems should occur.  Please show the CHEMOTHERAPY ALERT CARD or IMMUNOTHERAPY ALERT CARD at check-in to the Emergency Department and triage nurse.  Should you have questions after your visit or need to cancel or reschedule your appointment, please contact [facility] [phone]  and follow the prompts.  Office hours are 8:00 a.m. to 4:30 p.m. Monday - Friday. Please note that voicemails left after 4:00 p.m. may not be returned until the following business day.  We are closed weekends and major holidays. You have access to a nurse at all times for urgent questions. Please call the main number to the clinic [phone] and follow the prompts.  For any non-urgent questions, you may also contact your provider using MyChart. We now offer e-Visits for anyone 65 and older to request care online for non-urgent symptoms. For details visit [email or web address].   Also download the MyChart app! Go to the app store, search "MyChart", open the app, select [facility], and log in with your MyChart username and password.

## 2022-12-18 NOTE — Progress Notes (Signed)
Pt presents today for Venofer IV iron infusion per provider order. Vital signs stable and pt voiced no new complaints at this time.   Peripheral IV started with good blood return pre and post infusion.  Venofer 300 mg given today per MD orders. Tolerated infusion without adverse affects. Vital signs stable. No complaints at this time. Discharged from clinic ambulatory in stable condition. Alert and oriented x 3. F/U with [facility] as scheduled.

## 2022-12-24 ENCOUNTER — Other Ambulatory Visit: Payer: Self-pay

## 2022-12-24 ENCOUNTER — Encounter (HOSPITAL_COMMUNITY): Payer: Self-pay

## 2022-12-24 ENCOUNTER — Ambulatory Visit (HOSPITAL_COMMUNITY)
Admission: RE | Admit: 2022-12-24 | Discharge: 2022-12-24 | Disposition: A | Discharge status: Home / Self Care | Payer: BC Managed Care – PPO | Source: Ambulatory Visit | Attending: Unknown Physician Specialty | Admitting: Unknown Physician Specialty

## 2022-12-24 VITALS — BP 126/56 | HR 78 | Temp 97.9°F | Resp 18

## 2022-12-24 DIAGNOSIS — R188 Other ascites: Secondary | ICD-10-CM | POA: Diagnosis present

## 2022-12-24 DIAGNOSIS — K7469 Other cirrhosis of liver: Secondary | ICD-10-CM | POA: Insufficient documentation

## 2022-12-24 DIAGNOSIS — D631 Anemia in chronic kidney disease: Secondary | ICD-10-CM

## 2022-12-24 LAB — BODY FLUID CELL COUNT WITH DIFFERENTIAL
Eos, Fluid: 0 %
Lymphs, Fluid: 55 %
Monocyte-Macrophage-Serous Fluid: 40 % — ABNORMAL LOW (ref 50–90)
Neutrophil Count, Fluid: 5 % (ref 0–25)
Total Nucleated Cell Count, Fluid: 166 cu mm (ref 0–1000)

## 2022-12-24 MED ORDER — ALBUMIN HUMAN 25 % IV SOLN
INTRAVENOUS | Status: AC
  Administered 2022-12-24: 50 g via INTRAVENOUS
  Filled 2022-12-24: qty 200

## 2022-12-24 MED ORDER — ALBUMIN HUMAN 25 % IV SOLN: 50.0000 g | Freq: Once | INTRAVENOUS | Status: AC

## 2022-12-24 NOTE — Progress Notes (Signed)
Patient tolerated left sided paracentesis procedure and 50G of IV albumin well today. 5 Liters of yellow ascites removed with labs collected and sent for processing. PT verbalized understanding of discharge instructions and ambulatory at departure with no acute distress noted.

## 2022-12-24 NOTE — Procedures (Signed)
   Cirrhosis Recurrent ascites  US guided LLQ paracentesis 5L (max) cloudy yellow fluid  Sent for labs per MD  Tolerated well  EBL: scant

## 2022-12-25 ENCOUNTER — Inpatient Hospital Stay: Payer: BC Managed Care – PPO

## 2022-12-25 VITALS — BP 118/54 | HR 75 | Temp 98.3°F | Resp 18

## 2022-12-25 DIAGNOSIS — N1832 Chronic kidney disease, stage 3b: Secondary | ICD-10-CM

## 2022-12-25 DIAGNOSIS — D500 Iron deficiency anemia secondary to blood loss (chronic): Secondary | ICD-10-CM

## 2022-12-25 DIAGNOSIS — D509 Iron deficiency anemia, unspecified: Secondary | ICD-10-CM | POA: Diagnosis not present

## 2022-12-25 LAB — CBC
HCT: 27.4 % — ABNORMAL LOW (ref 39.0–52.0)
Hemoglobin: 8.8 g/dL — ABNORMAL LOW (ref 13.0–17.0)
MCH: 32.6 pg (ref 26.0–34.0)
MCHC: 32.1 g/dL (ref 30.0–36.0)
MCV: 101.5 fL — ABNORMAL HIGH (ref 80.0–100.0)
Platelets: 54 10*3/uL — ABNORMAL LOW (ref 150–400)
RBC: 2.7 MIL/uL — ABNORMAL LOW (ref 4.22–5.81)
RDW: 15.3 % (ref 11.5–15.5)
WBC: 2.5 10*3/uL — ABNORMAL LOW (ref 4.0–10.5)
nRBC: 0 % (ref 0.0–0.2)

## 2022-12-25 MED ORDER — EPOETIN ALFA-EPBX 20000 UNIT/ML IJ SOLN
20000.0000 [IU] | Freq: Once | INTRAMUSCULAR | Status: AC
  Administered 2022-12-25: 20000 [IU] via SUBCUTANEOUS
  Filled 2022-12-25: qty 1

## 2022-12-25 NOTE — Progress Notes (Signed)
Patient presents today for Retacrit injection. Hemoglobin reviewed prior to administration. VSS tolerated without incident or complaint. See MAR for details. Patient stable during and after injection. Patient discharged in satisfactory condition with no s/s of distress noted.  

## 2022-12-25 NOTE — Patient Instructions (Signed)
[facility]  Discharge Instructions: Thank you for choosing [facility] to provide your oncology and hematology care.  If you have a lab appointment with the [facility], please come in thru the Main Entrance and check in at the main information desk.  Wear comfortable clothing and clothing appropriate for easy access to any Portacath or PICC line.   We strive to give you quality time with your provider. You may need to reschedule your appointment if you arrive late (15 or more minutes).  Arriving late affects you and other patients whose appointments are after yours.  Also, if you miss three or more appointments without notifying the office, you may be dismissed from the clinic at the provider's discretion.      For prescription refill requests, have your pharmacy contact our office and allow 72 hours for refills to be completed.    Today you received the following Retacrit, return as scheduled.   To help prevent nausea and vomiting after your treatment, we encourage you to take your nausea medication as directed.  BELOW ARE SYMPTOMS THAT SHOULD BE REPORTED IMMEDIATELY: *FEVER GREATER THAN 100.4 F (38 C) OR HIGHER *CHILLS OR SWEATING *NAUSEA AND VOMITING THAT IS NOT CONTROLLED WITH YOUR NAUSEA MEDICATION *UNUSUAL SHORTNESS OF BREATH *UNUSUAL BRUISING OR BLEEDING *URINARY PROBLEMS (pain or burning when urinating, or frequent urination) *BOWEL PROBLEMS (unusual diarrhea, constipation, pain near the anus) TENDERNESS IN MOUTH AND THROAT WITH OR WITHOUT PRESENCE OF ULCERS (sore throat, sores in mouth, or a toothache) UNUSUAL RASH, SWELLING OR PAIN  UNUSUAL VAGINAL DISCHARGE OR ITCHING   Items with * indicate a potential emergency and should be followed up as soon as possible or go to the Emergency Department if any problems should occur.  Please show the CHEMOTHERAPY ALERT CARD or IMMUNOTHERAPY ALERT CARD at check-in to the Emergency Department and  triage nurse.  Should you have questions after your visit or need to cancel or reschedule your appointment, please contact [facility] [phone]  and follow the prompts.  Office hours are 8:00 a.m. to 4:30 p.m. Monday - Friday. Please note that voicemails left after 4:00 p.m. may not be returned until the following business day.  We are closed weekends and major holidays. You have access to a nurse at all times for urgent questions. Please call the main number to the clinic [phone] and follow the prompts.  For any non-urgent questions, you may also contact your provider using MyChart. We now offer e-Visits for anyone 53 and older to request care online for non-urgent symptoms. For details visit [email or web address].   Also download the MyChart app! Go to the app store, search "MyChart", open the app, select [facility], and log in with your MyChart username and password.

## 2022-12-28 ENCOUNTER — Other Ambulatory Visit (HOSPITAL_COMMUNITY): Payer: Self-pay | Admitting: Unknown Physician Specialty

## 2022-12-28 ENCOUNTER — Encounter (HOSPITAL_COMMUNITY): Payer: Self-pay | Admitting: Unknown Physician Specialty

## 2022-12-28 DIAGNOSIS — K7469 Other cirrhosis of liver: Secondary | ICD-10-CM

## 2022-12-28 DIAGNOSIS — R188 Other ascites: Secondary | ICD-10-CM

## 2022-12-31 ENCOUNTER — Ambulatory Visit (HOSPITAL_COMMUNITY)
Admission: RE | Admit: 2022-12-31 | Discharge: 2022-12-31 | Disposition: A | Discharge status: Home / Self Care | Payer: BC Managed Care – PPO | Source: Ambulatory Visit | Attending: Unknown Physician Specialty | Admitting: Unknown Physician Specialty

## 2022-12-31 ENCOUNTER — Encounter (HOSPITAL_COMMUNITY): Payer: Self-pay

## 2022-12-31 DIAGNOSIS — K7469 Other cirrhosis of liver: Secondary | ICD-10-CM | POA: Insufficient documentation

## 2022-12-31 LAB — BODY FLUID CELL COUNT WITH DIFFERENTIAL
Eos, Fluid: 0 %
Lymphs, Fluid: 46 %
Monocyte-Macrophage-Serous Fluid: 53 % (ref 50–90)
Neutrophil Count, Fluid: 1 % (ref 0–25)
Total Nucleated Cell Count, Fluid: 182 cu mm (ref 0–1000)

## 2022-12-31 MED ORDER — ALBUMIN HUMAN 25 % IV SOLN
INTRAVENOUS | Status: AC
  Administered 2022-12-31: 50 g via INTRAVENOUS
  Filled 2022-12-31: qty 200

## 2022-12-31 MED ORDER — ALBUMIN HUMAN 25 % IV SOLN: 50.0000 g | Freq: Once | INTRAVENOUS | Status: AC

## 2022-12-31 NOTE — Progress Notes (Signed)
Patient tolerated right sided paracentesis and 50G of IV albumin well today and 5 Liters of ascites removed with labs collected and taken to lab at 1115 by Remo Lipps, [place]. PT verbalized understanding of discharge instructions and ambulatory at departure with no acute distress noted.

## 2022-12-31 NOTE — Procedures (Addendum)
   US guided RLQ paracentesis 5 liters (max per MD) Yellow fluid Sent for labs  Tolerated well  EBL: scant

## 2023-01-07 ENCOUNTER — Other Ambulatory Visit: Payer: Self-pay

## 2023-01-07 ENCOUNTER — Ambulatory Visit (HOSPITAL_COMMUNITY)
Admission: RE | Admit: 2023-01-07 | Discharge: 2023-01-07 | Disposition: A | Discharge status: Home / Self Care | Payer: BC Managed Care – PPO | Source: Ambulatory Visit | Attending: Unknown Physician Specialty | Admitting: Unknown Physician Specialty

## 2023-01-07 ENCOUNTER — Encounter (HOSPITAL_COMMUNITY): Payer: Self-pay

## 2023-01-07 DIAGNOSIS — D631 Anemia in chronic kidney disease: Secondary | ICD-10-CM

## 2023-01-07 DIAGNOSIS — R188 Other ascites: Secondary | ICD-10-CM | POA: Diagnosis present

## 2023-01-07 DIAGNOSIS — K7469 Other cirrhosis of liver: Secondary | ICD-10-CM | POA: Insufficient documentation

## 2023-01-07 LAB — BODY FLUID CELL COUNT WITH DIFFERENTIAL
Eos, Fluid: 2 %
Lymphs, Fluid: 53 %
Monocyte-Macrophage-Serous Fluid: 44 % — ABNORMAL LOW (ref 50–90)
Neutrophil Count, Fluid: 1 % (ref 0–25)
Total Nucleated Cell Count, Fluid: 240 cu mm (ref 0–1000)

## 2023-01-07 MED ORDER — ALBUMIN HUMAN 25 % IV SOLN: 50.0000 g | Freq: Once | INTRAVENOUS | Status: AC

## 2023-01-07 MED ORDER — ALBUMIN HUMAN 25 % IV SOLN
INTRAVENOUS | Status: AC
  Administered 2023-01-07: 50 g via INTRAVENOUS
  Filled 2023-01-07: qty 200

## 2023-01-07 NOTE — Progress Notes (Signed)
Paracentesis complete no signs of distress.  

## 2023-01-07 NOTE — Procedures (Signed)
PROCEDURE SUMMARY:  Successful US guided paracentesis from right lower abdomen.  Yielded 5 L of clear yellow fluid.  No immediate complications.  Pt tolerated well.   Specimen sent for labs.  EBL < 2 mL  ** Patient is on the liver transplant list with [facility] **  Theresa Duty, NP 01/07/2023 1:27 PM

## 2023-01-08 ENCOUNTER — Inpatient Hospital Stay: Payer: BC Managed Care – PPO

## 2023-01-08 ENCOUNTER — Inpatient Hospital Stay: Payer: BC Managed Care – PPO | Attending: Physician Assistant

## 2023-01-08 ENCOUNTER — Other Ambulatory Visit: Payer: Self-pay | Admitting: Physician Assistant

## 2023-01-08 VITALS — BP 130/52 | HR 73 | Temp 97.7°F | Resp 17

## 2023-01-08 DIAGNOSIS — N1832 Chronic kidney disease, stage 3b: Secondary | ICD-10-CM | POA: Diagnosis not present

## 2023-01-08 DIAGNOSIS — D631 Anemia in chronic kidney disease: Secondary | ICD-10-CM | POA: Insufficient documentation

## 2023-01-08 DIAGNOSIS — I129 Hypertensive chronic kidney disease with stage 1 through stage 4 chronic kidney disease, or unspecified chronic kidney disease: Secondary | ICD-10-CM | POA: Insufficient documentation

## 2023-01-08 DIAGNOSIS — E1122 Type 2 diabetes mellitus with diabetic chronic kidney disease: Secondary | ICD-10-CM | POA: Insufficient documentation

## 2023-01-08 DIAGNOSIS — D500 Iron deficiency anemia secondary to blood loss (chronic): Secondary | ICD-10-CM

## 2023-01-08 LAB — CBC
HCT: 27.5 % — ABNORMAL LOW (ref 39.0–52.0)
Hemoglobin: 9.1 g/dL — ABNORMAL LOW (ref 13.0–17.0)
MCH: 33.6 pg (ref 26.0–34.0)
MCHC: 33.1 g/dL (ref 30.0–36.0)
MCV: 101.5 fL — ABNORMAL HIGH (ref 80.0–100.0)
Platelets: 48 10*3/uL — ABNORMAL LOW (ref 150–400)
RBC: 2.71 MIL/uL — ABNORMAL LOW (ref 4.22–5.81)
RDW: 14.7 % (ref 11.5–15.5)
WBC: 2.7 10*3/uL — ABNORMAL LOW (ref 4.0–10.5)
nRBC: 0 % (ref 0.0–0.2)

## 2023-01-08 MED ORDER — EPOETIN ALFA-EPBX 40000 UNIT/ML IJ SOLN
40000.0000 [IU] | Freq: Once | INTRAMUSCULAR | Status: AC
  Administered 2023-01-08: 40000 [IU] via SUBCUTANEOUS
  Filled 2023-01-08: qty 1

## 2023-01-08 NOTE — Progress Notes (Signed)
Retacrit dose changed to 40,000 units every 2 weeks.

## 2023-01-08 NOTE — Progress Notes (Signed)
Michael Harmon presents today for Retacrit injection per the provider's orders.  Stable during administration without incident; injection site WNL; see MAR for injection details.  Patient tolerated procedure well and without incident.  No questions or complaints noted at this time.  

## 2023-01-08 NOTE — Patient Instructions (Signed)
[facility]  Discharge Instructions: Thank you for choosing [facility] to provide your oncology and hematology care.  If you have a lab appointment with the Cancer Center, please come in thru the Main Entrance and check in at the main information desk.  Wear comfortable clothing and clothing appropriate for easy access to any Portacath or PICC line.   We strive to give you quality time with your provider. You may need to reschedule your appointment if you arrive late (15 or more minutes).  Arriving late affects you and other patients whose appointments are after yours.  Also, if you miss three or more appointments without notifying the office, you may be dismissed from the clinic at the provider's discretion.      For prescription refill requests, have your pharmacy contact our office and allow 72 hours for refills to be completed.    Today you received the following chemotherapy and/or immunotherapy agents Retacrit      To help prevent nausea and vomiting after your treatment, we encourage you to take your nausea medication as directed.  BELOW ARE SYMPTOMS THAT SHOULD BE REPORTED IMMEDIATELY: *FEVER GREATER THAN 100.4 F (38 C) OR HIGHER *CHILLS OR SWEATING *NAUSEA AND VOMITING THAT IS NOT CONTROLLED WITH YOUR NAUSEA MEDICATION *UNUSUAL SHORTNESS OF BREATH *UNUSUAL BRUISING OR BLEEDING *URINARY PROBLEMS (pain or burning when urinating, or frequent urination) *BOWEL PROBLEMS (unusual diarrhea, constipation, pain near the anus) TENDERNESS IN MOUTH AND THROAT WITH OR WITHOUT PRESENCE OF ULCERS (sore throat, sores in mouth, or a toothache) UNUSUAL RASH, SWELLING OR PAIN  UNUSUAL VAGINAL DISCHARGE OR ITCHING   Items with * indicate a potential emergency and should be followed up as soon as possible or go to the Emergency Department if any problems should occur.  Please show the CHEMOTHERAPY ALERT CARD or IMMUNOTHERAPY ALERT CARD at check-in to the  Emergency Department and triage nurse.  Should you have questions after your visit or need to cancel or reschedule your appointment, please contact [facility] [phone]  and follow the prompts.  Office hours are 8:00 a.m. to 4:30 p.m. Monday - Friday. Please note that voicemails left after 4:00 p.m. may not be returned until the following business day.  We are closed weekends and major holidays. You have access to a nurse at all times for urgent questions. Please call the main number to the clinic [phone] and follow the prompts.  For any non-urgent questions, you may also contact your provider using MyChart. We now offer e-Visits for anyone 18 and older to request care online for non-urgent symptoms. For details visit [email or web address].   Also download the MyChart app! Go to the app store, search "MyChart", open the app, select [facility], and log in with your MyChart username and password.   

## 2023-01-14 ENCOUNTER — Encounter (HOSPITAL_COMMUNITY): Payer: Self-pay

## 2023-01-14 ENCOUNTER — Ambulatory Visit (HOSPITAL_COMMUNITY)
Admission: RE | Admit: 2023-01-14 | Discharge: 2023-01-14 | Disposition: A | Discharge status: Home / Self Care | Payer: BC Managed Care – PPO | Source: Ambulatory Visit | Attending: Unknown Physician Specialty | Admitting: Unknown Physician Specialty

## 2023-01-14 DIAGNOSIS — K7469 Other cirrhosis of liver: Secondary | ICD-10-CM | POA: Insufficient documentation

## 2023-01-14 LAB — GRAM STAIN: Gram Stain: NONE SEEN

## 2023-01-14 LAB — BODY FLUID CELL COUNT WITH DIFFERENTIAL
Eos, Fluid: 2 %
Lymphs, Fluid: 86 %
Monocyte-Macrophage-Serous Fluid: 11 % — ABNORMAL LOW (ref 50–90)
Neutrophil Count, Fluid: 1 % (ref 0–25)
Total Nucleated Cell Count, Fluid: 200 cu mm (ref 0–1000)

## 2023-01-14 MED ORDER — ALBUMIN HUMAN 25 % IV SOLN: 50.0000 g | Freq: Once | INTRAVENOUS | Status: AC

## 2023-01-14 MED ORDER — ALBUMIN HUMAN 25 % IV SOLN
INTRAVENOUS | Status: AC
  Administered 2023-01-14: 50 g via INTRAVENOUS
  Filled 2023-01-14: qty 200

## 2023-01-14 NOTE — Progress Notes (Signed)
Paracentesis complete no signs of distress. 5L ascites removed.   

## 2023-01-14 NOTE — Procedures (Signed)
   US guided RLQ paracentesis 5 Liters (max) Yellow fluid Sent for labs per MD  Tolerated well  EBL: scant

## 2023-01-16 HISTORY — PX: LIVER TRANSPLANT: SHX410

## 2023-01-19 LAB — CULTURE, BODY FLUID W GRAM STAIN -BOTTLE: Culture: NO GROWTH

## 2023-01-21 ENCOUNTER — Ambulatory Visit (HOSPITAL_COMMUNITY): Admission: RE | Admit: 2023-01-21 | Payer: BC Managed Care – PPO | Source: Ambulatory Visit

## 2023-01-22 ENCOUNTER — Inpatient Hospital Stay: Payer: BC Managed Care – PPO

## 2023-01-22 ENCOUNTER — Inpatient Hospital Stay: Payer: BC Managed Care – PPO | Admitting: Physician Assistant

## 2023-01-22 ENCOUNTER — Telehealth: Payer: Self-pay

## 2023-01-22 NOTE — Transitions of Care (Post Inpatient/ED Visit) (Signed)
   01/22/2023  Name: Michael Harmon MRN: [id number] DOB: 10-05-1961  Today's TOC FU Call Status: Today's TOC FU Call Status:: Successful TOC FU Call Competed TOC FU Call Complete Date: 01/22/23  Transition Care Management Follow-up Telephone Call Date of Discharge: 01/21/23 Discharge Facility: Other ([place]) Name of Other (Non[facility]) Discharge Facility: [facility] Type of Discharge: Inpatient Admission Primary Inpatient Discharge Diagnosis:: liver transplant How have you been since you were released from the hospital?: Better Any questions or concerns?: No  Items Reviewed: Did you receive and understand the discharge instructions provided?: Yes Medications obtained and verified?: Yes (Medications Reviewed) Any new allergies since your discharge?: No Dietary orders reviewed?: Yes Do you have support at home?: Yes People in Home: spouse  Home Care and Equipment/Supplies: [place] Ordered?: NA Any new equipment or medical supplies ordered?: NA  Functional Questionnaire: Do you need assistance with bathing/showering or dressing?: No Do you need assistance with meal preparation?: No Do you need assistance with eating?: No Do you have difficulty maintaining continence: No Do you need assistance with getting out of bed/getting out of a chair/moving?: No Do you have difficulty managing or taking your medications?: No  Follow up appointments reviewed: PCP Follow-up appointment confirmed?: [facility] Follow-up appointment confirmed?: Yes Date of Specialist follow-up appointment?: 01/25/23 Follow-Up Specialty Provider:: [facility] Do you need transportation to your follow-up appointment?: No Do you understand care options if your condition(s) worsen?: Yes-patient verbalized understanding    [place], [place] Nurse Health Advisor Direct Dial [phone]

## 2023-01-28 ENCOUNTER — Other Ambulatory Visit (HOSPITAL_COMMUNITY): Payer: BC Managed Care – PPO

## 2023-01-28 ENCOUNTER — Ambulatory Visit (HOSPITAL_COMMUNITY): Payer: BC Managed Care – PPO

## 2023-02-11 ENCOUNTER — Telehealth: Payer: Self-pay

## 2023-02-11 ENCOUNTER — Other Ambulatory Visit (HOSPITAL_COMMUNITY)
Admission: RE | Admit: 2023-02-11 | Discharge: 2023-02-11 | Disposition: A | Discharge status: Home / Self Care | Payer: BC Managed Care – PPO | Source: Ambulatory Visit | Attending: Psychiatry | Admitting: Psychiatry

## 2023-02-11 DIAGNOSIS — R39198 Other difficulties with micturition: Secondary | ICD-10-CM | POA: Diagnosis present

## 2023-02-11 LAB — URINALYSIS, ROUTINE W REFLEX MICROSCOPIC
Bilirubin Urine: NEGATIVE
Glucose, UA: NEGATIVE mg/dL
Hgb urine dipstick: NEGATIVE
Ketones, ur: NEGATIVE mg/dL
Leukocytes,Ua: NEGATIVE
Nitrite: NEGATIVE
Protein, ur: NEGATIVE mg/dL
Specific Gravity, Urine: 1.009 (ref 1.005–1.030)
pH: 5 (ref 5.0–8.0)

## 2023-02-11 NOTE — Telephone Encounter (Signed)
Patient called advising he is having flowrate issues when he wakes up after liver transplant last month, the surgeon recommended to follow up with you, patient is on 0.4 mg dose of flomax prescribed after surgery. I wanted to see if you wanted him to see you or see the surgeon.

## 2023-02-17 LAB — URINE CULTURE: Culture: NO GROWTH

## 2023-02-18 ENCOUNTER — Other Ambulatory Visit (HOSPITAL_COMMUNITY)
Admission: RE | Admit: 2023-02-18 | Discharge: 2023-02-18 | Disposition: A | Discharge status: Home / Self Care | Payer: BC Managed Care – PPO | Source: Ambulatory Visit | Attending: Student | Admitting: Student

## 2023-02-18 ENCOUNTER — Other Ambulatory Visit: Payer: Self-pay

## 2023-02-18 DIAGNOSIS — Z944 Liver transplant status: Secondary | ICD-10-CM | POA: Diagnosis present

## 2023-02-18 DIAGNOSIS — D500 Iron deficiency anemia secondary to blood loss (chronic): Secondary | ICD-10-CM

## 2023-02-18 DIAGNOSIS — N1832 Chronic kidney disease, stage 3b: Secondary | ICD-10-CM

## 2023-02-18 LAB — CBC WITH DIFFERENTIAL/PLATELET
Abs Immature Granulocytes: 0.02 10*3/uL (ref 0.00–0.07)
Basophils Absolute: 0 10*3/uL (ref 0.0–0.1)
Basophils Relative: 1 %
Eosinophils Absolute: 0.1 10*3/uL (ref 0.0–0.5)
Eosinophils Relative: 1 %
HCT: 28.3 % — ABNORMAL LOW (ref 39.0–52.0)
Hemoglobin: 9.2 g/dL — ABNORMAL LOW (ref 13.0–17.0)
Immature Granulocytes: 1 %
Lymphocytes Relative: 33 %
Lymphs Abs: 1.2 10*3/uL (ref 0.7–4.0)
MCH: 31.3 pg (ref 26.0–34.0)
MCHC: 32.5 g/dL (ref 30.0–36.0)
MCV: 96.3 fL (ref 80.0–100.0)
Monocytes Absolute: 0.2 10*3/uL (ref 0.1–1.0)
Monocytes Relative: 6 %
Neutro Abs: 2.1 10*3/uL (ref 1.7–7.7)
Neutrophils Relative %: 58 %
Platelets: 145 10*3/uL — ABNORMAL LOW (ref 150–400)
RBC: 2.94 MIL/uL — ABNORMAL LOW (ref 4.22–5.81)
RDW: 19.3 % — ABNORMAL HIGH (ref 11.5–15.5)
WBC: 3.5 10*3/uL — ABNORMAL LOW (ref 4.0–10.5)
nRBC: 0 % (ref 0.0–0.2)

## 2023-02-18 LAB — COMPREHENSIVE METABOLIC PANEL
ALT: 12 U/L (ref 0–44)
AST: 10 U/L — ABNORMAL LOW (ref 15–41)
Albumin: 3.6 g/dL (ref 3.5–5.0)
Alkaline Phosphatase: 72 U/L (ref 38–126)
Anion gap: 8 (ref 5–15)
BUN: 56 mg/dL — ABNORMAL HIGH (ref 8–23)
CO2: 23 mmol/L (ref 22–32)
Calcium: 8.8 mg/dL — ABNORMAL LOW (ref 8.9–10.3)
Chloride: 104 mmol/L (ref 98–111)
Creatinine, Ser: 1.55 mg/dL — ABNORMAL HIGH (ref 0.61–1.24)
GFR, Estimated: 50 mL/min — ABNORMAL LOW (ref >60)
Glucose, Bld: 134 mg/dL — ABNORMAL HIGH (ref 70–99)
Potassium: 5.2 mmol/L — ABNORMAL HIGH (ref 3.5–5.1)
Sodium: 135 mmol/L (ref 135–145)
Total Bilirubin: 0.1 mg/dL — ABNORMAL LOW (ref 0.3–1.2)
Total Protein: 6.2 g/dL — ABNORMAL LOW (ref 6.5–8.1)

## 2023-02-18 LAB — MAGNESIUM: Magnesium: 1.9 mg/dL (ref 1.7–2.4)

## 2023-02-21 LAB — TACROLIMUS LEVEL: Tacrolimus (FK506) - LabCorp: 3.3 ng/mL (ref 2.0–20.0)

## 2023-02-25 ENCOUNTER — Other Ambulatory Visit (HOSPITAL_COMMUNITY)
Admission: RE | Admit: 2023-02-25 | Discharge: 2023-02-25 | Disposition: A | Discharge status: Home / Self Care | Payer: BC Managed Care – PPO | Source: Ambulatory Visit | Attending: Student | Admitting: Student

## 2023-02-25 ENCOUNTER — Other Ambulatory Visit (HOSPITAL_COMMUNITY): Admit: 2023-02-25 | Payer: BC Managed Care – PPO

## 2023-02-25 DIAGNOSIS — Z944 Liver transplant status: Secondary | ICD-10-CM | POA: Insufficient documentation

## 2023-02-25 LAB — COMPREHENSIVE METABOLIC PANEL
ALT: 12 U/L (ref 0–44)
AST: 9 U/L — ABNORMAL LOW (ref 15–41)
Albumin: 3.4 g/dL — ABNORMAL LOW (ref 3.5–5.0)
Alkaline Phosphatase: 59 U/L (ref 38–126)
Anion gap: 8 (ref 5–15)
BUN: 42 mg/dL — ABNORMAL HIGH (ref 8–23)
CO2: 21 mmol/L — ABNORMAL LOW (ref 22–32)
Calcium: 8.7 mg/dL — ABNORMAL LOW (ref 8.9–10.3)
Chloride: 106 mmol/L (ref 98–111)
Creatinine, Ser: 1.56 mg/dL — ABNORMAL HIGH (ref 0.61–1.24)
GFR, Estimated: 50 mL/min — ABNORMAL LOW (ref >60)
Glucose, Bld: 116 mg/dL — ABNORMAL HIGH (ref 70–99)
Potassium: 5.7 mmol/L — ABNORMAL HIGH (ref 3.5–5.1)
Sodium: 135 mmol/L (ref 135–145)
Total Bilirubin: 0.6 mg/dL (ref 0.3–1.2)
Total Protein: 5.8 g/dL — ABNORMAL LOW (ref 6.5–8.1)

## 2023-02-25 LAB — CBC WITH DIFFERENTIAL/PLATELET
Abs Immature Granulocytes: 0.01 10*3/uL (ref 0.00–0.07)
Basophils Absolute: 0 10*3/uL (ref 0.0–0.1)
Basophils Relative: 1 %
Eosinophils Absolute: 0.1 10*3/uL (ref 0.0–0.5)
Eosinophils Relative: 2 %
HCT: 26.7 % — ABNORMAL LOW (ref 39.0–52.0)
Hemoglobin: 8.6 g/dL — ABNORMAL LOW (ref 13.0–17.0)
Immature Granulocytes: 0 %
Lymphocytes Relative: 29 %
Lymphs Abs: 1.2 10*3/uL (ref 0.7–4.0)
MCH: 31.6 pg (ref 26.0–34.0)
MCHC: 32.2 g/dL (ref 30.0–36.0)
MCV: 98.2 fL (ref 80.0–100.0)
Monocytes Absolute: 0.2 10*3/uL (ref 0.1–1.0)
Monocytes Relative: 5 %
Neutro Abs: 2.8 10*3/uL (ref 1.7–7.7)
Neutrophils Relative %: 63 %
Platelets: 111 10*3/uL — ABNORMAL LOW (ref 150–400)
RBC: 2.72 MIL/uL — ABNORMAL LOW (ref 4.22–5.81)
RDW: 18.7 % — ABNORMAL HIGH (ref 11.5–15.5)
WBC: 4.3 10*3/uL (ref 4.0–10.5)
nRBC: 0 % (ref 0.0–0.2)

## 2023-02-25 LAB — MAGNESIUM: Magnesium: 1.8 mg/dL (ref 1.7–2.4)

## 2023-02-27 LAB — TACROLIMUS LEVEL: Tacrolimus (FK506) - LabCorp: 5 ng/mL (ref 2.0–20.0)

## 2023-03-05 ENCOUNTER — Other Ambulatory Visit (HOSPITAL_COMMUNITY)
Admission: RE | Admit: 2023-03-05 | Discharge: 2023-03-05 | Disposition: A | Discharge status: Home / Self Care | Payer: BC Managed Care – PPO | Source: Ambulatory Visit | Attending: Student | Admitting: Student

## 2023-03-05 DIAGNOSIS — D631 Anemia in chronic kidney disease: Secondary | ICD-10-CM | POA: Insufficient documentation

## 2023-03-05 DIAGNOSIS — N1832 Chronic kidney disease, stage 3b: Secondary | ICD-10-CM | POA: Insufficient documentation

## 2023-03-05 LAB — COMPREHENSIVE METABOLIC PANEL
ALT: 13 U/L (ref 0–44)
AST: 10 U/L — ABNORMAL LOW (ref 15–41)
Albumin: 3.6 g/dL (ref 3.5–5.0)
Alkaline Phosphatase: 59 U/L (ref 38–126)
Anion gap: 6 (ref 5–15)
BUN: 45 mg/dL — ABNORMAL HIGH (ref 8–23)
CO2: 22 mmol/L (ref 22–32)
Calcium: 8.7 mg/dL — ABNORMAL LOW (ref 8.9–10.3)
Chloride: 108 mmol/L (ref 98–111)
Creatinine, Ser: 1.7 mg/dL — ABNORMAL HIGH (ref 0.61–1.24)
GFR, Estimated: 45 mL/min — ABNORMAL LOW (ref >60)
Glucose, Bld: 106 mg/dL — ABNORMAL HIGH (ref 70–99)
Potassium: 5.7 mmol/L — ABNORMAL HIGH (ref 3.5–5.1)
Sodium: 136 mmol/L (ref 135–145)
Total Bilirubin: 0.6 mg/dL (ref 0.3–1.2)
Total Protein: 5.9 g/dL — ABNORMAL LOW (ref 6.5–8.1)

## 2023-03-05 LAB — CBC WITH DIFFERENTIAL/PLATELET
Abs Immature Granulocytes: 0.01 10*3/uL (ref 0.00–0.07)
Basophils Absolute: 0 10*3/uL (ref 0.0–0.1)
Basophils Relative: 0 %
Eosinophils Absolute: 0.1 10*3/uL (ref 0.0–0.5)
Eosinophils Relative: 2 %
HCT: 28.8 % — ABNORMAL LOW (ref 39.0–52.0)
Hemoglobin: 9 g/dL — ABNORMAL LOW (ref 13.0–17.0)
Immature Granulocytes: 0 %
Lymphocytes Relative: 30 %
Lymphs Abs: 0.8 10*3/uL (ref 0.7–4.0)
MCH: 31.6 pg (ref 26.0–34.0)
MCHC: 31.3 g/dL (ref 30.0–36.0)
MCV: 101.1 fL — ABNORMAL HIGH (ref 80.0–100.0)
Monocytes Absolute: 0.2 10*3/uL (ref 0.1–1.0)
Monocytes Relative: 6 %
Neutro Abs: 1.7 10*3/uL (ref 1.7–7.7)
Neutrophils Relative %: 62 %
Platelets: 105 10*3/uL — ABNORMAL LOW (ref 150–400)
RBC: 2.85 MIL/uL — ABNORMAL LOW (ref 4.22–5.81)
RDW: 18.1 % — ABNORMAL HIGH (ref 11.5–15.5)
WBC: 2.8 10*3/uL — ABNORMAL LOW (ref 4.0–10.5)
nRBC: 0 % (ref 0.0–0.2)

## 2023-03-05 LAB — MAGNESIUM: Magnesium: 1.8 mg/dL (ref 1.7–2.4)

## 2023-03-08 LAB — TACROLIMUS LEVEL: Tacrolimus (FK506) - LabCorp: 8.6 ng/mL (ref 2.0–20.0)

## 2023-03-10 ENCOUNTER — Ambulatory Visit: Payer: BC Managed Care – PPO | Admitting: Internal Medicine

## 2023-03-17 ENCOUNTER — Encounter: Payer: Self-pay | Admitting: Internal Medicine

## 2023-03-17 ENCOUNTER — Ambulatory Visit: Payer: BC Managed Care – PPO | Admitting: Internal Medicine

## 2023-03-17 VITALS — BP 134/64 | HR 70 | Ht 67.0 in | Wt 160.4 lb

## 2023-03-17 DIAGNOSIS — I10 Essential (primary) hypertension: Secondary | ICD-10-CM | POA: Diagnosis not present

## 2023-03-17 DIAGNOSIS — D696 Thrombocytopenia, unspecified: Secondary | ICD-10-CM

## 2023-03-17 DIAGNOSIS — C61 Malignant neoplasm of prostate: Secondary | ICD-10-CM

## 2023-03-17 DIAGNOSIS — D849 Immunodeficiency, unspecified: Secondary | ICD-10-CM | POA: Diagnosis not present

## 2023-03-17 DIAGNOSIS — Z794 Long term (current) use of insulin: Secondary | ICD-10-CM

## 2023-03-17 DIAGNOSIS — K7460 Unspecified cirrhosis of liver: Secondary | ICD-10-CM

## 2023-03-17 DIAGNOSIS — Z944 Liver transplant status: Secondary | ICD-10-CM | POA: Insufficient documentation

## 2023-03-17 DIAGNOSIS — E1169 Type 2 diabetes mellitus with other specified complication: Secondary | ICD-10-CM

## 2023-03-17 NOTE — Assessment & Plan Note (Signed)
On tacrolimus, CellCept and prednisone On Bactrim MWF and valacyclovir for prophylaxis

## 2023-03-17 NOTE — Assessment & Plan Note (Signed)
HbA1c: 5.5   Associated with HLD On NovoLog ISS, followed by Endocrinology in [facility] endocrinology, advised to continue to follow up due to labile glycemic profile Gvoke for hypoglycemia Advised to follow diabetic diet Not on statin due to liver cirrhosis F/u CMP and lipid panel Diabetic eye exam: Advised to follow up with Ophthalmology for diabetic eye exam

## 2023-03-17 NOTE — Assessment & Plan Note (Signed)
Chronic, likely in the setting of liver cirrhosis Followed by Hematology Used to get platelet transfusions before procedures

## 2023-03-17 NOTE — Assessment & Plan Note (Signed)
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

## 2023-03-17 NOTE — Patient Instructions (Addendum)
Please continue to take medications as prescribed.  Please continue to follow low carb diet and ambulate as tolerated. 

## 2023-03-17 NOTE — Assessment & Plan Note (Signed)
Completed radiotherapy for prostate cancer.  He is followed by oncology and urology currently.  He denies any hematuria, dysuria or urinary hesitancy or resistance currently.

## 2023-03-17 NOTE — Assessment & Plan Note (Signed)
BP Readings from Last 1 Encounters:  03/17/23 134/64   Well-controlled Not on diuretics now due to episodes of hypotension

## 2023-03-17 NOTE — Assessment & Plan Note (Signed)
S/p liver transplant now Followed by [facility] and [facility] liver clinic Has esophageal varices and pancytopenia Has had Hep A and B vaccines Liver enzymes stable overall

## 2023-03-17 NOTE — Progress Notes (Signed)
Established Patient Office Visit  Subjective:  Patient ID: Michael Harmon, male    DOB: Apr 19, 1961  Age: 62 y.o. MRN: [id number]  CC:  Chief Complaint  Patient presents with   Diabetes    HPI Michael Harmon is a 62 y.o. male with past medical history of HTN, hepatic cirrhosis, portal hypertension, esophageal varices, celiac disease, type II DM, HLD, thrombocytopenia and IDA who presents for f/u of his chronic medical conditions.  Celiac disease, hepatic cirrhosis: He had liver transplant on 01/16/2023.  He is on tacrolimus, CellCept and prednisone currently.  Followed by [facility] transplant team and nephrology.  He is followed by [facility] and [facility] liver clinic.  He has history of esophageal varices as well.  He used to get surveillance EGD locally, but has to get it at [facility] now due to his thrombocytopenia. EGD has shown esophageal varices in the past, for which he needs repeat surveillance EGD. He denies any melena or hematochezia currently. He has chronic leg swelling in the setting of hypoalbuminemia.  His leg swelling and abdominal distention have improved now.  He has completed radiotherapy for prostate cancer.  He is followed by oncology and urology currently.  He denies any hematuria, dysuria or urinary hesitancy or resistance currently.  Type 2 DM: He takes [employer], and follows up with Endocrinology.  His HbA1c was 5.5 in the last week at [facility]. He did not tolerate metformin in the past, had diarrhea with that.  He attributes his celiac disease symptom onset to metformin.  Denies any polyuria or polydipsia currently.  He takes Lipitor for HLD.  He is also on ACEi for renal protection.  HTN: BP is well-controlled at home. Patient denies headache, dizziness, chest pain, dyspnea or palpitations.   Past Medical History:  Diagnosis Date   Allergy    nose and sinus problems   Anemia    Anemia in chronic kidney disease (CKD) 11/23/2022   Asthma    Celiac disease     Cirrhosis, cryptogenic (HCC)    completed Hep A and B vaccines   DM (diabetes mellitus) (HCC)    Encounter for general adult medical examination with abnormal findings 09/08/2022   GERD (gastroesophageal reflux disease)    Hyperlipidemia    diet controlled now with 100 pound weight loss   Hypertension    Iron deficiency anemia due to chronic blood loss 08/13/2021   Malignant neoplasm of prostate (HCC) 10/03/2021   Murmur, heart 03/02/2022   Narrow angle glaucoma suspect of both eyes    Renal cyst 09/03/2017   right   Situational depression 07/06/2017   Splenomegaly    Thrombocytopenia (HCC)    hematology, ?related to chol med (tricor) and glimeripide, just being monitored, above 100,000    Past Surgical History:  Procedure Laterality Date   BIOPSY  07/09/2016   Procedure: BIOPSY;  Surgeon: Corbin Ade, MD;  [facility];  Service: Endoscopy;;  duodenal, gastric, esophaageal   BIOPSY  07/31/2021   Procedure: BIOPSY;  Surgeon: Corbin Ade, MD;  [facility];  Service: Endoscopy;;   CLOSED MANIPULATION SHOULDER     COLONOSCOPY  11/2015   Dr. Allena Katz: cecal bx neg for microscopic colitis. ascending colon polyp (adenomatous)   COLONOSCOPY WITH PROPOFOL N/A 07/31/2021   Procedure: COLONOSCOPY WITH PROPOFOL;  Surgeon: Corbin Ade, MD;  [facility];  Service: Endoscopy;  Laterality: N/A;  7:30am   ESOPHAGEAL BANDING N/A 08/04/2017   Procedure:  ESOPHAGEAL BANDING;  Surgeon: Corbin Ade, MD;  [facility];  Service: Endoscopy;  Laterality: N/A;  esophageal varices banding   ESOPHAGEAL BANDING N/A 10/13/2017   Procedure: ESOPHAGEAL BANDING;  Surgeon: Corbin Ade, MD;  [facility];  Service: Endoscopy;  Laterality: N/A;   ESOPHAGEAL BANDING N/A 04/09/2020   Procedure: ESOPHAGEAL BANDING;  Surgeon: Corbin Ade, MD;  [facility];  Service: Endoscopy;  Laterality: N/A;   ESOPHAGOGASTRODUODENOSCOPY N/A 07/09/2016    Procedure: ESOPHAGOGASTRODUODENOSCOPY (EGD);  Surgeon: Corbin Ade, MD;  [facility];  Service: Endoscopy;  Laterality: N/A;  730   ESOPHAGOGASTRODUODENOSCOPY N/A 08/04/2017   Procedure: ESOPHAGOGASTRODUODENOSCOPY (EGD);  Surgeon: Corbin Ade, MD;  [facility];  Service: Endoscopy;  Laterality: N/A;  7:30am   ESOPHAGOGASTRODUODENOSCOPY N/A 10/13/2017   Dr. Jena Gauss: grade 2 esophageal varices status post banding, portal hypertensive gastropathy   ESOPHAGOGASTRODUODENOSCOPY  11/23/2019   [name]: Esophageal varices, 4 columns of grade 2-3 status post banding.  Varices more prominent than seen in December 2018.  Portal gastropathy.  Normal-appearing small bowel.   ESOPHAGOGASTRODUODENOSCOPY (EGD) WITH PROPOFOL N/A 04/09/2020   Dr. Jena Gauss: 4 columns of grade 2/grade 3 esophageal varices somewhat recalcitrant.  Status post esophageal band ligation today.  Portal gastropathy.  Normal-appearing small bowel.   ESOPHAGOGASTRODUODENOSCOPY (EGD) WITH PROPOFOL N/A 07/31/2021   Procedure: ESOPHAGOGASTRODUODENOSCOPY (EGD) WITH PROPOFOL;  Surgeon: Corbin Ade, MD;  [facility];  Service: Endoscopy;  Laterality: N/A;   GOLD SEED IMPLANT N/A 01/15/2022   Procedure: GOLD SEED IMPLANT;  Surgeon: Malen Gauze, MD;  Location: [facility];  Service: Urology;  Laterality: N/A;   LIVER TRANSPLANT  01/16/2023   SPACE OAR INSTILLATION N/A 01/15/2022   Procedure: SPACE OAR INSTILLATION;  Surgeon: Malen Gauze, MD;  Location: [facility];  Service: Urology;  Laterality: N/A;   US PARACENTESIS  06/10/2022    Family History  Problem Relation Age of Onset   Other Mother        chronic diarrhea   COPD Mother    Heart disease Mother        died at 15   Arthritis Mother    Cancer Mother    Depression Mother    Diabetes Mother    Hyperlipidemia Mother    Hypertension Mother    Stroke Mother    Arthritis Father    Asthma Father    Birth defects Father    Heart disease Father         aortic valve replaced   Heart disease Maternal Grandmother    Stroke Maternal Grandmother    Heart disease Maternal Grandfather    Stroke Maternal Grandfather    Diabetes Paternal Grandmother    Stroke Paternal Grandfather    Heart disease Paternal Grandfather    Liver disease Neg Hx    Colon cancer Neg Hx     Social History   Socioeconomic History   Marital status: Married    Spouse name: Melissa   Number of children: 0   Years of education: 14   Highest education level: Not on file  Occupational History   Occupation: Lexicographer, 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   Has no children.   Eats all food groups.   Works for [employer]       Social Determinants of Health   Financial Resource Strain: Not on file  Food Insecurity: Not on file  Transportation Needs: Not on file  Physical Activity: Not on file  Stress: Not on file  Social Connections: Not on file  Intimate Partner Violence: Not on file    Outpatient Medications Prior to Visit  Medication Sig Dispense Refill   aspirin EC 81 MG tablet Take by mouth.     Calcium Citrate-Vitamin D 315-5 MG-MCG TABS Take 2 tablets by mouth 2 (two) times daily.     clotrimazole (MYCELEX) 10 MG troche Take by mouth.     Continuous Blood Gluc Receiver (FREESTYLE LIBRE 2 READER) DEVI USE AS DIRECTED TO TEST GLUCOSE LEVELS AT LEAST 4 TIMES DAILY E11.9     Continuous Blood Gluc Sensor (FREESTYLE LIBRE 2 SENSOR) MISC USE AS DIRECTED FOR BLOOD SUGAR MONITORING. CHANGE EVERY 14 DAYS     Insulin Aspart FlexPen (NOVOLOG) 100 UNIT/ML Inject into the skin.     mycophenolate  (MYFORTIC) 180 MG EC tablet Take 720 mg by mouth 2 (two) times daily.     pantoprazole (PROTONIX) 40 MG tablet Take 40 mg by mouth 2 (two) times daily.     predniSONE (DELTASONE) 5 MG tablet Take by mouth.     senna-docusate (SENOKOT-S) 8.6-50 MG tablet Take by mouth.     Specialty Vitamins Products (MG PLUS PROTEIN) 133 MG TABS Take by mouth.     sulfamethoxazole-trimethoprim (BACTRIM DS) 800-160 MG tablet Take by mouth.     tacrolimus (PROGRAF) 1 MG capsule Take by mouth.     tamsulosin (FLOMAX) 0.4 MG CAPS capsule Take by mouth.     valGANciclovir (VALCYTE) 450 MG tablet Take 1 tablet (450 mg total) by mouth daily with dinner.     furosemide (LASIX) 20 MG tablet Take 20 mg by mouth 2 (two) times daily.     mycophenolate (CELLCEPT) 250 MG capsule Take by mouth.     acetaminophen (TYLENOL) 325 MG tablet Take 650 mg by mouth every 6 (six) hours as needed for moderate pain. (Patient not taking: Reported on 01/22/2023)     BD PEN NEEDLE NANO 2ND GEN 32G X 4 MM MISC USE TO INJECT INSULIN 4 TIMES DAILY E11.65 (Patient not taking: Reported on 03/17/2023)     Cholecalciferol (VITAMIN D3) 125 MCG (5000 UT) TABS Take 5,000 Units by mouth in the morning.     folic acid (FOLVITE) 0.5 MG tablet Take 0.5 mg by mouth daily. (Patient not taking: Reported on 03/17/2023)     Glucagon (GVOKE HYPOPEN 2-PACK) 1 MG/0.2ML SOAJ INJECT 1 MG AS NEEDED BY SUBCUTANEOUS ROUTE.     hydrocortisone (ANUSOL-HC) 25 MG suppository Place 1 suppository (25 mg total) rectally 2 (two) times daily as needed for hemorrhoids or anal itching. 12 suppository 2   Multiple Vitamin (MULTIVITAMIN WITH MINERALS) TABS tablet Take 1 tablet by mouth in the morning.     oxyCODONE (ROXICODONE) 5 MG immediate release tablet Take 1 tablet (5 mg total) by mouth every 6 (six) hours as needed for severe pain. 10 tablet 0   BENADRYL ITCH STOPPING 2 % GEL Apply topically. (Patient not taking: Reported on 03/17/2023)     ciprofloxacin (CIPRO) 500 MG tablet  Take 500 mg by mouth daily. (Patient not taking: Reported on 01/22/2023)     doxycycline (VIBRAMYCIN) 100 MG capsule Take by mouth. (Patient not taking: Reported on 03/17/2023)     LEVEMIR FLEXTOUCH 100 UNIT/ML Pen Inject 28 Units into the skin at bedtime.     mycophenolate (MYFORTIC) 180 MG EC tablet Take by mouth.     nateglinide (STARLIX) 60 MG tablet Take 1 tablet by mouth 3 (three) times daily.     No facility-administered medications prior to visit.    Allergies  Allergen Reactions   Penicillins Other (See Comments)    As a baby, unknown reaction Did it involve swelling of the face/tongue/throat, SOB, or low BP? Unknown Did it involve sudden or severe rash/hives, skin peeling, or any reaction on the inside of your mouth or nose? Unknown Did you need to seek medical attention at a hospital or doctor's office? Unknown When did it last happen?      infant allergy If all above answers are "NO", may proceed with cephalosporin use. .    Gluten Meal Diarrhea   Biaxin [Clarithromycin] Tinitus    Ears ringing, loss sense of smell   Prednisone Hives    Made blood sugar high    ROS Review of Systems  Constitutional:  Positive for fatigue. Negative for chills and fever.  HENT:  Negative for congestion and sore throat.   Eyes:  Negative for pain and discharge.  Respiratory:  Negative for cough and shortness of breath.   Cardiovascular:  Positive for leg swelling. Negative for chest pain and palpitations.  Gastrointestinal:  Positive for diarrhea. Negative for nausea and vomiting.  Endocrine: Negative for polydipsia and polyuria.  Genitourinary:  Negative for dysuria and hematuria.  Musculoskeletal:  Negative for neck pain and neck stiffness.  Skin:  Negative for rash.  Neurological:  Negative for dizziness, weakness, numbness and headaches.  Psychiatric/Behavioral:  Positive for sleep disturbance. Negative for agitation and behavioral problems.       Objective:    Physical  Exam Vitals reviewed.  Constitutional:      General: He is not in acute distress.    Appearance: He is not diaphoretic.  HENT:     Head: Normocephalic and atraumatic.     Nose: Nose normal.     Mouth/Throat:     Mouth: Mucous membranes are moist.  Eyes:     General: No scleral icterus.    Extraocular Movements: Extraocular movements intact.  Cardiovascular:     Rate and Rhythm: Normal rate and regular rhythm.     Pulses: Normal pulses.     Heart sounds: Normal heart sounds. No murmur heard. Pulmonary:     Breath sounds: Normal breath sounds. No wheezing or rales.  Musculoskeletal:     Cervical back: Neck supple. No tenderness.     Right lower leg: Edema (Mild) present.     Left lower leg: Edema (Mild) present.  Skin:    General: Skin is warm.     Findings: No rash.  Neurological:     General: No focal deficit present.     Mental Status: He is alert and oriented to person, place, and time.     Sensory: No sensory deficit.     Motor: No weakness.  Psychiatric:        Mood and Affect: Mood normal.        Behavior: Behavior normal.     BP 134/64 (BP Location: Right Arm)   Pulse 70   Ht  5\' 7"  (1.702 m)   Wt 160 lb 6.4 oz (72.8 kg)   SpO2 98%   BMI 25.12 kg/m  Wt Readings from Last 3 Encounters:  03/17/23 160 lb 6.4 oz (72.8 kg)  11/23/22 198 lb 10.2 oz (90.1 kg)  09/21/22 203 lb 0.7 oz (92.1 kg)    Lab Results  Component Value Date   TSH 3.135 10/22/2022   Lab Results  Component Value Date   WBC 2.8 (L) 03/05/2023   HGB 9.0 (L) 03/05/2023   HCT 28.8 (L) 03/05/2023   MCV 101.1 (H) 03/05/2023   PLT 105 (L) 03/05/2023   Lab Results  Component Value Date   NA 136 03/05/2023   K 5.7 (H) 03/05/2023   CO2 22 03/05/2023   GLUCOSE 106 (H) 03/05/2023   BUN 45 (H) 03/05/2023   CREATININE 1.70 (H) 03/05/2023   BILITOT 0.6 03/05/2023   ALKPHOS 59 03/05/2023   AST 10 (L) 03/05/2023   ALT 13 03/05/2023   PROT 5.9 (L) 03/05/2023   ALBUMIN 3.6 03/05/2023    CALCIUM 8.7 (L) 03/05/2023   ANIONGAP 6 03/05/2023   Lab Results  Component Value Date   CHOL 148 09/14/2022   Lab Results  Component Value Date   HDL 52 09/14/2022   Lab Results  Component Value Date   LDLCALC 88 09/14/2022   Lab Results  Component Value Date   TRIG 42 09/14/2022   Lab Results  Component Value Date   CHOLHDL 2.8 09/14/2022   Lab Results  Component Value Date   HGBA1C 4.3 (L) 09/14/2022      Assessment & Plan:   Problem List Items Addressed This Visit       Cardiovascular and Mediastinum   Essential hypertension    BP Readings from Last 1 Encounters:  03/17/23 134/64  Well-controlled Not on diuretics now due to episodes of hypotension      Relevant Medications   aspirin EC 81 MG tablet     Digestive   Hepatic cirrhosis (HCC)    S/p liver transplant now Followed by [facility] and [facility] liver clinic Has esophageal varices and pancytopenia Has had Hep A and B vaccines Liver enzymes stable overall        Endocrine   Type 2 diabetes mellitus with other specified complication (HCC)    HbA1c: 5.5   Associated with HLD On NovoLog ISS, followed by Endocrinology [facility], advised to continue to follow up due to labile glycemic profile Gvoke for hypoglycemia Advised to follow diabetic diet Not on statin due to liver cirrhosis F/u CMP and lipid panel Diabetic eye exam: Advised to follow up with Ophthalmology for diabetic eye exam      Relevant Medications   aspirin EC 81 MG tablet   Insulin Aspart FlexPen (NOVOLOG) 100 UNIT/ML     Genitourinary   Malignant neoplasm of prostate (HCC)    Completed radiotherapy for prostate cancer.  He is followed by oncology and urology currently.  He denies any hematuria, dysuria or urinary hesitancy or resistance currently.      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  Total time spent: 32 mins    No orders of the defined types were placed in this encounter.   Follow-up: Return in about 6 months (around 09/17/2023) for Annual physical.    [name], MD

## 2023-03-26 NOTE — Progress Notes (Unsigned)
Michael Harmon 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  Since his last visit, he had liver transplant on 01/16/2023, and continues to follow with [facility] neph[facility] and transplant hepatology teams.  ***kidney transplant too??***  At today's visit, he reports feeling ***.  ***no retacrit since transplant *** no bleeding ***bruising and petichiae of legs  He has ***% energy and ***% appetite. He endorses that he is maintaining a stable weight.   ASSESSMENT & PLAN:  1.  *** - *** - PLAN: ***  2.  *** - *** - PLAN: ***  PLAN SUMMARY: >> *** >> *** >> ***   [facility] **VISIT SUMMARY & IMPORTANT INSTRUCTIONS **   You were seen today by Rojelio Brenner PA-C for your ***.    *** ***  *** ***  LABS: Return in ***   OTHER TESTS: ***  MEDICATIONS: ***  FOLLOW-UP APPOINTMENT: ***     REVIEW OF SYSTEMS: ***  Review of Systems - Oncology   PHYSICAL EXAM:  ECOG PERFORMANCE STATUS: {CHL ONC ECOG GN:[id number]} *** There were no vitals filed for this visit. There were no vitals filed for this visit. Physical Exam  PAST MEDICAL/SURGICAL HISTORY:  Past Medical History:  Diagnosis Date  . Allergy    nose and sinus problems  . Anemia   . Anemia in chronic kidney disease (CKD) 11/23/2022  . Asthma   . Celiac disease   . Cirrhosis,  cryptogenic (HCC)    completed Hep A and B vaccines  . DM (diabetes mellitus) (HCC)   . Encounter for general adult medical examination with abnormal findings 09/08/2022  . GERD (gastroesophageal reflux disease)   . Hyperlipidemia    diet controlled now with 100 pound weight loss  . Hypertension   . Iron deficiency anemia due to chronic blood loss 08/13/2021  . Malignant neoplasm of prostate (HCC) 10/03/2021  . Murmur, heart 03/02/2022  . Narrow angle glaucoma suspect of both eyes   . Renal cyst 09/03/2017   right  . Situational depression 07/06/2017  . Splenomegaly   . Thrombocytopenia (HCC)    hematology, ?related to chol med (tricor) and glimeripide, just being monitored, above 100,000   Past Surgical History:  Procedure Laterality Date  . BIOPSY  07/09/2016   Procedure: BIOPSY;  Surgeon: Corbin Ade, MD;  [facility];  Service: Endoscopy;;  duodenal, gastric, esophaageal  . BIOPSY  07/31/2021   Procedure: BIOPSY;  Surgeon: Corbin Ade, MD;  [facility];  Service: Endoscopy;;  . CLOSED MANIPULATION SHOULDER    . COLONOSCOPY  11/2015   Dr. Allena Katz: cecal bx neg for microscopic colitis. ascending colon polyp (adenomatous)  . COLONOSCOPY WITH PROPOFOL N/A 07/31/2021   Procedure: COLONOSCOPY WITH PROPOFOL;  Surgeon: Corbin Ade, MD;  [facility];  Service: Endoscopy;  Laterality: N/A;  7:30am  . ESOPHAGEAL BANDING N/A 08/04/2017  Procedure: ESOPHAGEAL BANDING;  Surgeon: Corbin Ade, MD;  [facility];  Service: Endoscopy;  Laterality: N/A;  esophageal varices banding  . ESOPHAGEAL BANDING N/A 10/13/2017   Procedure: ESOPHAGEAL BANDING;  Surgeon: Corbin Ade, MD;  [facility];  Service: Endoscopy;  Laterality: N/A;  . ESOPHAGEAL BANDING N/A 04/09/2020   Procedure: ESOPHAGEAL BANDING;  Surgeon: Corbin Ade, MD;  [facility];  Service: Endoscopy;  Laterality: N/A;  . ESOPHAGOGASTRODUODENOSCOPY N/A  07/09/2016   Procedure: ESOPHAGOGASTRODUODENOSCOPY (EGD);  Surgeon: Corbin Ade, MD;  [facility];  Service: Endoscopy;  Laterality: N/A;  730  . ESOPHAGOGASTRODUODENOSCOPY N/A 08/04/2017   Procedure: ESOPHAGOGASTRODUODENOSCOPY (EGD);  Surgeon: Corbin Ade, MD;  [facility];  Service: Endoscopy;  Laterality: N/A;  7:30am  . ESOPHAGOGASTRODUODENOSCOPY N/A 10/13/2017   Dr. Jena Gauss: grade 2 esophageal varices status post banding, portal hypertensive gastropathy  . ESOPHAGOGASTRODUODENOSCOPY  11/23/2019   [name]: Esophageal varices, 4 columns of grade 2-3 status post banding.  Varices more prominent than seen in December 2018.  Portal gastropathy.  Normal-appearing small bowel.  . ESOPHAGOGASTRODUODENOSCOPY (EGD) WITH PROPOFOL N/A 04/09/2020   Dr. Jena Gauss: 4 columns of grade 2/grade 3 esophageal varices somewhat recalcitrant.  Status post esophageal band ligation today.  Portal gastropathy.  Normal-appearing small bowel.  . ESOPHAGOGASTRODUODENOSCOPY (EGD) WITH PROPOFOL N/A 07/31/2021   Procedure: ESOPHAGOGASTRODUODENOSCOPY (EGD) WITH PROPOFOL;  Surgeon: Corbin Ade, MD;  [facility];  Service: Endoscopy;  Laterality: N/A;  . GOLD SEED IMPLANT N/A 01/15/2022   Procedure: GOLD SEED IMPLANT;  Surgeon: Malen Gauze, MD;  Location: [facility];  Service: Urology;  Laterality: N/A;  . LIVER TRANSPLANT  01/16/2023  . SPACE OAR INSTILLATION N/A 01/15/2022   Procedure: SPACE OAR INSTILLATION;  Surgeon: Malen Gauze, MD;  Location: [facility];  Service: Urology;  Laterality: N/A;  . US PARACENTESIS  06/10/2022    SOCIAL HISTORY:  Social History   Socioeconomic History  . Marital status: Married    Spouse name: Melissa  . Number of children: 0  . Years of education: 10  . Highest education level: Not on file  Occupational History  . 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 Smoking status: Never  . Smokeless tobacco: Never   Vaping Use  . Vaping Use: Never used  Substance and Sexual Activity  . Alcohol use: Not Currently  . Drug use: No  . Sexual activity: Not Currently  Other Topics Concern  . Not on file  Social History Narrative   Lives at home with wife Efraim Kaufmann   One dog.   Has no children.   Eats all food groups.   Works for [employer]       Social Determinants of Health   Financial Resource Strain: Not on file  Food Insecurity: Not on file  Transportation Needs: Not on file  Physical Activity: Not on file  Stress: Not on file  Social Connections: Not on file  Intimate Partner Violence: Not on file    FAMILY HISTORY:  Family History  Problem Relation Age of Onset  . Other Mother        chronic diarrhea  . COPD Mother   . Heart disease Mother        died at 86  . Arthritis Mother   . Cancer Mother   . Depression Mother   . Diabetes Mother   . Hyperlipidemia Mother   .  Hypertension Mother   . Stroke Mother   . Arthritis Father   . Asthma Father   . Birth defects Father   . Heart disease Father        aortic valve replaced  . Heart disease Maternal Grandmother   . Stroke Maternal Grandmother   . Heart disease Maternal Grandfather   . Stroke Maternal Grandfather   . Diabetes Paternal Grandmother   . Stroke Paternal Grandfather   . Heart disease Paternal Grandfather   . Liver disease Neg Hx   . Colon cancer Neg Hx     CURRENT MEDICATIONS:  Outpatient Encounter Medications as of 03/30/2023  Medication Sig  . acetaminophen (TYLENOL) 325 MG tablet Take 650 mg by mouth every 6 (six) hours as needed for moderate pain. (Patient not taking: Reported on 01/22/2023)  . aspirin EC 81 MG tablet Take by mouth.  . BD PEN NEEDLE NANO 2ND GEN 32G X 4 MM MISC USE TO INJECT INSULIN 4 TIMES DAILY E11.65 (Patient not taking: Reported on 03/17/2023)  . Calcium Citrate-Vitamin D 315-5 MG-MCG TABS Take 2 tablets by mouth 2 (two) times daily.  . Cholecalciferol  (VITAMIN D3) 125 MCG (5000 UT) TABS Take 5,000 Units by mouth in the morning.  . clotrimazole (MYCELEX) 10 MG troche Take by mouth.  . Continuous Blood Gluc Receiver (FREESTYLE LIBRE 2 READER) DEVI USE AS DIRECTED TO TEST GLUCOSE LEVELS AT LEAST 4 TIMES DAILY E11.9  . Continuous Blood Gluc Sensor (FREESTYLE LIBRE 2 SENSOR) MISC USE AS DIRECTED FOR BLOOD SUGAR MONITORING. CHANGE EVERY 14 DAYS  . folic acid (FOLVITE) 0.5 MG tablet Take 0.5 mg by mouth daily. (Patient not taking: Reported on 03/17/2023)  . Glucagon (GVOKE HYPOPEN 2-PACK) 1 MG/0.2ML SOAJ INJECT 1 MG AS NEEDED BY SUBCUTANEOUS ROUTE.  . hydrocortisone (ANUSOL-HC) 25 MG suppository Place 1 suppository (25 mg total) rectally 2 (two) times daily as needed for hemorrhoids or anal itching.  . Insulin Aspart FlexPen (NOVOLOG) 100 UNIT/ML Inject into the skin.  . Multiple Vitamin (MULTIVITAMIN WITH MINERALS) TABS tablet Take 1 tablet by mouth in the morning.  . mycophenolate (MYFORTIC) 180 MG EC tablet Take 720 mg by mouth 2 (two) times daily.  Marland Kitchen oxyCODONE (ROXICODONE) 5 MG immediate release tablet Take 1 tablet (5 mg total) by mouth every 6 (six) hours as needed for severe pain.  . pantoprazole (PROTONIX) 40 MG tablet Take 40 mg by mouth 2 (two) times daily.  . predniSONE (DELTASONE) 5 MG tablet Take by mouth.  . senna-docusate (SENOKOT-S) 8.6-50 MG tablet Take by mouth.  Marland Kitchen Specialty Vitamins Products (MG PLUS PROTEIN) 133 MG TABS Take by mouth.  . sulfamethoxazole-trimethoprim (BACTRIM DS) 800-160 MG tablet Take by mouth.  . tacrolimus (PROGRAF) 1 MG capsule Take by mouth.  . valGANciclovir (VALCYTE) 450 MG tablet Take 1 tablet (450 mg total) by mouth daily with dinner.   No facility-administered encounter medications on file as of 03/30/2023.    ALLERGIES:  Allergies  Allergen Reactions  . Penicillins Other (See Comments)    As a baby, unknown reaction Did it involve swelling of the face/tongue/throat, SOB, or low BP? Unknown Did it  involve sudden or severe rash/hives, skin peeling, or any reaction on the inside of your mouth or nose? Unknown Did you need to seek medical attention at a [facility] or doctor's office? Unknown When did it last happen?      infant allergy If all above answers are "NO", may proceed with cephalosporin use. Marland Kitchen   Marland Kitchen  Gluten Meal Diarrhea  . Biaxin [Clarithromycin] Tinitus    Ears ringing, loss sense of smell  . Prednisone Hives    Made blood sugar high    LABORATORY DATA:  I have reviewed the labs as listed.  CBC    Component Value Date/Time   WBC 2.8 (L) 03/05/2023 0832   RBC 2.85 (L) 03/05/2023 0832   HGB 9.0 (L) 03/05/2023 0832   HGB 11.1 (L) 09/02/2021 1655   HCT 28.8 (L) 03/05/2023 0832   HCT 32.9 (L) 09/02/2021 1655   PLT 105 (L) 03/05/2023 0832   PLT 43 (LL) 09/02/2021 1655   MCV 101.1 (H) 03/05/2023 0832   MCV 96 09/02/2021 1655   MCH 31.6 03/05/2023 0832   MCHC 31.3 03/05/2023 0832   RDW 18.1 (H) 03/05/2023 0832   RDW 14.0 09/02/2021 1655   LYMPHSABS 0.8 03/05/2023 0832   LYMPHSABS 1.2 09/02/2021 1655   MONOABS 0.2 03/05/2023 0832   EOSABS 0.1 03/05/2023 0832   EOSABS 0.3 09/02/2021 1655   BASOSABS 0.0 03/05/2023 0832   BASOSABS 0.0 09/02/2021 1655      Latest Ref Rng & Units 03/05/2023    8:32 AM 02/25/2023    8:26 AM 02/18/2023    8:21 AM  CMP  Glucose 70 - 99 mg/dL 161  096  045   BUN 8 - 23 mg/dL 45  42  56   Creatinine 0.61 - 1.24 mg/dL 4.09  8.11  9.14   Sodium 135 - 145 mmol/L 136  135  135   Potassium 3.5 - 5.1 mmol/L 5.7  5.7  5.2   Chloride 98 - 111 mmol/L 108  106  104   CO2 22 - 32 mmol/L 22  21  23    Calcium 8.9 - 10.3 mg/dL 8.7  8.7  8.8   Total Protein 6.5 - 8.1 g/dL 5.9  5.8  6.2   Total Bilirubin 0.3 - 1.2 mg/dL 0.6  0.6  0.1   Alkaline Phos 38 - 126 U/L 59  59  72   AST 15 - 41 U/L 10  9  10    ALT 0 - 44 U/L 13  12  12      DIAGNOSTIC IMAGING:  I have independently reviewed the relevant imaging and discussed with the patient.   WRAP UP:   All questions were answered. The patient knows to call the clinic with any problems, questions or concerns.  Medical decision making: ***  Time spent on visit: I spent *** minutes counseling the patient face to face. The total time spent in the appointment was *** minutes and more than 50% was on counseling.  [name], PA-C  ***

## 2023-03-30 ENCOUNTER — Inpatient Hospital Stay: Payer: BC Managed Care – PPO | Attending: Physician Assistant

## 2023-03-30 ENCOUNTER — Inpatient Hospital Stay (HOSPITAL_BASED_OUTPATIENT_CLINIC_OR_DEPARTMENT_OTHER): Payer: BC Managed Care – PPO | Admitting: Physician Assistant

## 2023-03-30 VITALS — BP 141/58 | HR 72 | Temp 97.8°F | Resp 18 | Ht 67.0 in | Wt 170.0 lb

## 2023-03-30 DIAGNOSIS — D61818 Other pancytopenia: Secondary | ICD-10-CM | POA: Diagnosis present

## 2023-03-30 DIAGNOSIS — N1832 Chronic kidney disease, stage 3b: Secondary | ICD-10-CM

## 2023-03-30 DIAGNOSIS — I129 Hypertensive chronic kidney disease with stage 1 through stage 4 chronic kidney disease, or unspecified chronic kidney disease: Secondary | ICD-10-CM | POA: Diagnosis not present

## 2023-03-30 DIAGNOSIS — D631 Anemia in chronic kidney disease: Secondary | ICD-10-CM

## 2023-03-30 DIAGNOSIS — D500 Iron deficiency anemia secondary to blood loss (chronic): Secondary | ICD-10-CM

## 2023-03-30 DIAGNOSIS — D72819 Decreased white blood cell count, unspecified: Secondary | ICD-10-CM | POA: Diagnosis not present

## 2023-03-30 DIAGNOSIS — Z944 Liver transplant status: Secondary | ICD-10-CM | POA: Diagnosis not present

## 2023-03-30 DIAGNOSIS — E538 Deficiency of other specified B group vitamins: Secondary | ICD-10-CM | POA: Diagnosis not present

## 2023-03-30 DIAGNOSIS — D696 Thrombocytopenia, unspecified: Secondary | ICD-10-CM | POA: Insufficient documentation

## 2023-03-30 DIAGNOSIS — C61 Malignant neoplasm of prostate: Secondary | ICD-10-CM | POA: Insufficient documentation

## 2023-03-30 DIAGNOSIS — D509 Iron deficiency anemia, unspecified: Secondary | ICD-10-CM | POA: Insufficient documentation

## 2023-03-30 DIAGNOSIS — K7460 Unspecified cirrhosis of liver: Secondary | ICD-10-CM | POA: Diagnosis not present

## 2023-03-30 LAB — VITAMIN B12: Vitamin B-12: 268 pg/mL (ref 180–914)

## 2023-03-30 LAB — CBC WITH DIFFERENTIAL/PLATELET
Abs Immature Granulocytes: 0.04 10*3/uL (ref 0.00–0.07)
Basophils Absolute: 0 10*3/uL (ref 0.0–0.1)
Basophils Relative: 0 %
Eosinophils Absolute: 0 10*3/uL (ref 0.0–0.5)
Eosinophils Relative: 1 %
HCT: 29.1 % — ABNORMAL LOW (ref 39.0–52.0)
Hemoglobin: 9 g/dL — ABNORMAL LOW (ref 13.0–17.0)
Immature Granulocytes: 2 %
Lymphocytes Relative: 38 %
Lymphs Abs: 0.9 10*3/uL (ref 0.7–4.0)
MCH: 31.7 pg (ref 26.0–34.0)
MCHC: 30.9 g/dL (ref 30.0–36.0)
MCV: 102.5 fL — ABNORMAL HIGH (ref 80.0–100.0)
Monocytes Absolute: 0.2 10*3/uL (ref 0.1–1.0)
Monocytes Relative: 10 %
Neutro Abs: 1.2 10*3/uL — ABNORMAL LOW (ref 1.7–7.7)
Neutrophils Relative %: 49 %
Platelets: 116 10*3/uL — ABNORMAL LOW (ref 150–400)
RBC: 2.84 MIL/uL — ABNORMAL LOW (ref 4.22–5.81)
RDW: 16 % — ABNORMAL HIGH (ref 11.5–15.5)
WBC: 2.4 10*3/uL — ABNORMAL LOW (ref 4.0–10.5)
nRBC: 0 % (ref 0.0–0.2)

## 2023-03-30 LAB — COMPREHENSIVE METABOLIC PANEL
ALT: 11 U/L (ref 0–44)
AST: 9 U/L — ABNORMAL LOW (ref 15–41)
Albumin: 3.6 g/dL (ref 3.5–5.0)
Alkaline Phosphatase: 48 U/L (ref 38–126)
Anion gap: 9 (ref 5–15)
BUN: 50 mg/dL — ABNORMAL HIGH (ref 8–23)
CO2: 17 mmol/L — ABNORMAL LOW (ref 22–32)
Calcium: 8.5 mg/dL — ABNORMAL LOW (ref 8.9–10.3)
Chloride: 113 mmol/L — ABNORMAL HIGH (ref 98–111)
Creatinine, Ser: 1.85 mg/dL — ABNORMAL HIGH (ref 0.61–1.24)
GFR, Estimated: 41 mL/min — ABNORMAL LOW (ref >60)
Glucose, Bld: 115 mg/dL — ABNORMAL HIGH (ref 70–99)
Potassium: 5.3 mmol/L — ABNORMAL HIGH (ref 3.5–5.1)
Sodium: 139 mmol/L (ref 135–145)
Total Bilirubin: 0.3 mg/dL (ref 0.3–1.2)
Total Protein: 6 g/dL — ABNORMAL LOW (ref 6.5–8.1)

## 2023-03-30 LAB — IRON AND TIBC
Iron: 86 ug/dL (ref 45–182)
Saturation Ratios: 35 % (ref 17.9–39.5)
TIBC: 248 ug/dL — ABNORMAL LOW (ref 250–450)
UIBC: 162 ug/dL

## 2023-03-30 LAB — FERRITIN: Ferritin: 132 ng/mL (ref 24–336)

## 2023-03-30 MED ORDER — CYANOCOBALAMIN 500 MCG PO TABS
500.0000 ug | ORAL_TABLET | Freq: Every day | ORAL | 3 refills | Status: DC
Start: 2023-03-30 — End: 2023-11-12

## 2023-03-31 ENCOUNTER — Other Ambulatory Visit: Payer: Self-pay

## 2023-03-31 ENCOUNTER — Other Ambulatory Visit (HOSPITAL_COMMUNITY)
Admission: RE | Admit: 2023-03-31 | Discharge: 2023-03-31 | Disposition: A | Discharge status: Home / Self Care | Payer: BC Managed Care – PPO | Source: Ambulatory Visit | Attending: Psychiatry | Admitting: Psychiatry

## 2023-03-31 DIAGNOSIS — D61818 Other pancytopenia: Secondary | ICD-10-CM

## 2023-03-31 DIAGNOSIS — Z944 Liver transplant status: Secondary | ICD-10-CM | POA: Diagnosis present

## 2023-03-31 DIAGNOSIS — D500 Iron deficiency anemia secondary to blood loss (chronic): Secondary | ICD-10-CM

## 2023-03-31 DIAGNOSIS — D849 Immunodeficiency, unspecified: Secondary | ICD-10-CM | POA: Insufficient documentation

## 2023-03-31 DIAGNOSIS — D649 Anemia, unspecified: Secondary | ICD-10-CM | POA: Insufficient documentation

## 2023-03-31 DIAGNOSIS — D696 Thrombocytopenia, unspecified: Secondary | ICD-10-CM

## 2023-03-31 LAB — CBC WITH DIFFERENTIAL/PLATELET
Abs Immature Granulocytes: 0.04 10*3/uL (ref 0.00–0.07)
Basophils Absolute: 0 10*3/uL (ref 0.0–0.1)
Basophils Relative: 1 %
Eosinophils Absolute: 0 10*3/uL (ref 0.0–0.5)
Eosinophils Relative: 1 %
HCT: 28.6 % — ABNORMAL LOW (ref 39.0–52.0)
Hemoglobin: 9 g/dL — ABNORMAL LOW (ref 13.0–17.0)
Immature Granulocytes: 2 %
Lymphocytes Relative: 40 %
Lymphs Abs: 0.9 10*3/uL (ref 0.7–4.0)
MCH: 31.8 pg (ref 26.0–34.0)
MCHC: 31.5 g/dL (ref 30.0–36.0)
MCV: 101.1 fL — ABNORMAL HIGH (ref 80.0–100.0)
Monocytes Absolute: 0.2 10*3/uL (ref 0.1–1.0)
Monocytes Relative: 8 %
Neutro Abs: 1.1 10*3/uL — ABNORMAL LOW (ref 1.7–7.7)
Neutrophils Relative %: 48 %
Platelets: 108 10*3/uL — ABNORMAL LOW (ref 150–400)
RBC: 2.83 MIL/uL — ABNORMAL LOW (ref 4.22–5.81)
RDW: 15.9 % — ABNORMAL HIGH (ref 11.5–15.5)
WBC: 2.2 10*3/uL — ABNORMAL LOW (ref 4.0–10.5)
nRBC: 0 % (ref 0.0–0.2)

## 2023-03-31 LAB — COMPREHENSIVE METABOLIC PANEL
ALT: 11 U/L (ref 0–44)
AST: 10 U/L — ABNORMAL LOW (ref 15–41)
Albumin: 3.7 g/dL (ref 3.5–5.0)
Alkaline Phosphatase: 52 U/L (ref 38–126)
Anion gap: 10 (ref 5–15)
BUN: 45 mg/dL — ABNORMAL HIGH (ref 8–23)
CO2: 15 mmol/L — ABNORMAL LOW (ref 22–32)
Calcium: 8.7 mg/dL — ABNORMAL LOW (ref 8.9–10.3)
Chloride: 111 mmol/L (ref 98–111)
Creatinine, Ser: 1.9 mg/dL — ABNORMAL HIGH (ref 0.61–1.24)
GFR, Estimated: 39 mL/min — ABNORMAL LOW (ref >60)
Glucose, Bld: 108 mg/dL — ABNORMAL HIGH (ref 70–99)
Potassium: 5.7 mmol/L — ABNORMAL HIGH (ref 3.5–5.1)
Sodium: 136 mmol/L (ref 135–145)
Total Bilirubin: 0.5 mg/dL (ref 0.3–1.2)
Total Protein: 6.1 g/dL — ABNORMAL LOW (ref 6.5–8.1)

## 2023-03-31 LAB — IRON AND TIBC
Iron: 102 ug/dL (ref 45–182)
Saturation Ratios: 40 % — ABNORMAL HIGH (ref 17.9–39.5)
TIBC: 258 ug/dL (ref 250–450)
UIBC: 156 ug/dL

## 2023-03-31 LAB — FERRITIN: Ferritin: 129 ng/mL (ref 24–336)

## 2023-03-31 LAB — MAGNESIUM: Magnesium: 1.8 mg/dL (ref 1.7–2.4)

## 2023-04-01 LAB — METHYLMALONIC ACID, SERUM: Methylmalonic Acid, Quantitative: 228 nmol/L (ref 0–378)

## 2023-04-02 LAB — TACROLIMUS LEVEL: Tacrolimus (FK506) - LabCorp: 5.3 ng/mL (ref 2.0–20.0)

## 2023-04-09 ENCOUNTER — Encounter: Payer: Self-pay | Admitting: Physician Assistant

## 2023-04-09 ENCOUNTER — Other Ambulatory Visit: Payer: Self-pay | Admitting: Physician Assistant

## 2023-04-09 ENCOUNTER — Telehealth: Payer: Self-pay | Admitting: Physician Assistant

## 2023-04-09 DIAGNOSIS — D631 Anemia in chronic kidney disease: Secondary | ICD-10-CM

## 2023-04-09 NOTE — Progress Notes (Signed)
Per nurse (Tomi Izola Price RN) conversation with patient, patient had received permission from his transplant team to restart Retacrit injections.  Prior to transplant, we had increased his dose to Retacrit 40,000 units every 2 weeks, but had not yet seen significant benefit in blood counts.  I will start him back at 20,000 units Retacrit once a week, with further adjustments to be made based on response, tolerability, and patient blood pressure.  PLAN SUMMARY: >> CBC + Retacrit 20,000 units every week >> Follow-up as scheduled in 2 months for lab panel and office visit  [name], PA-C 04/09/23 10:10 AM

## 2023-04-09 NOTE — Telephone Encounter (Signed)
Attempted to contact Mr. Mcfayden to discuss restarting his Retacrit injections. Brief voicemail message was left for patient, and I will send him additional details via MyChart message.

## 2023-04-13 ENCOUNTER — Other Ambulatory Visit: Payer: Self-pay

## 2023-04-13 DIAGNOSIS — D631 Anemia in chronic kidney disease: Secondary | ICD-10-CM

## 2023-04-14 ENCOUNTER — Inpatient Hospital Stay: Payer: BC Managed Care – PPO | Attending: Physician Assistant

## 2023-04-14 ENCOUNTER — Inpatient Hospital Stay: Payer: BC Managed Care – PPO

## 2023-04-14 VITALS — BP 136/57 | HR 71 | Temp 97.6°F | Resp 18

## 2023-04-14 DIAGNOSIS — C61 Malignant neoplasm of prostate: Secondary | ICD-10-CM | POA: Insufficient documentation

## 2023-04-14 DIAGNOSIS — D61818 Other pancytopenia: Secondary | ICD-10-CM | POA: Insufficient documentation

## 2023-04-14 DIAGNOSIS — N1832 Chronic kidney disease, stage 3b: Secondary | ICD-10-CM | POA: Diagnosis not present

## 2023-04-14 DIAGNOSIS — D631 Anemia in chronic kidney disease: Secondary | ICD-10-CM | POA: Diagnosis not present

## 2023-04-14 DIAGNOSIS — I129 Hypertensive chronic kidney disease with stage 1 through stage 4 chronic kidney disease, or unspecified chronic kidney disease: Secondary | ICD-10-CM | POA: Insufficient documentation

## 2023-04-14 DIAGNOSIS — D509 Iron deficiency anemia, unspecified: Secondary | ICD-10-CM | POA: Diagnosis not present

## 2023-04-14 LAB — CBC
HCT: 30.7 % — ABNORMAL LOW (ref 39.0–52.0)
Hemoglobin: 9.5 g/dL — ABNORMAL LOW (ref 13.0–17.0)
MCH: 31.6 pg (ref 26.0–34.0)
MCHC: 30.9 g/dL (ref 30.0–36.0)
MCV: 102 fL — ABNORMAL HIGH (ref 80.0–100.0)
Platelets: 135 10*3/uL — ABNORMAL LOW (ref 150–400)
RBC: 3.01 MIL/uL — ABNORMAL LOW (ref 4.22–5.81)
RDW: 15.6 % — ABNORMAL HIGH (ref 11.5–15.5)
WBC: 2.2 10*3/uL — ABNORMAL LOW (ref 4.0–10.5)
nRBC: 0 % (ref 0.0–0.2)

## 2023-04-14 MED ORDER — EPOETIN ALFA-EPBX 20000 UNIT/ML IJ SOLN
20000.0000 [IU] | Freq: Once | INTRAMUSCULAR | Status: AC
  Administered 2023-04-14: 20000 [IU] via SUBCUTANEOUS
  Filled 2023-04-14: qty 1

## 2023-04-14 NOTE — Progress Notes (Signed)
Michael Harmon presents today for injection per the provider's orders. HGB 9.5.  Retacrit 20,000 units administration without incident; injection site WNL; see MAR for injection details.  Patient tolerated procedure well and without incident.  No questions or complaints noted at this time. Treatment given today per MD orders. Discharged from clinic ambulatory in stable condition. Alert and oriented x 3. F/U with [facility] as scheduled.

## 2023-04-14 NOTE — Patient Instructions (Signed)
[facility]  Discharge Instructions: Thank you for choosing [facility] to provide your oncology and hematology care.  If you have a lab appointment with the Cancer Center - please note that after April 8th, 2024, all labs will be drawn in the cancer center.  You do not have to check in or register with the main entrance as you have in the past but will complete your check-in in the cancer center.  Wear comfortable clothing and clothing appropriate for easy access to any Portacath or PICC line.   We strive to give you quality time with your provider. You may need to reschedule your appointment if you arrive late (15 or more minutes).  Arriving late affects you and other patients whose appointments are after yours.  Also, if you miss three or more appointments without notifying the office, you may be dismissed from the clinic at the provider's discretion.      For prescription refill requests, have your pharmacy contact our office and allow 72 hours for refills to be completed.    Today you received the following chemotherapy and/or immunotherapy agents Retacrit.  Epoetin Alfa Injection What is this medication? EPOETIN ALFA (e POE e tin AL fa) treats low levels of red blood cells (anemia) caused by kidney disease, chemotherapy, or HIV medications. It can also be used in people who are at risk for blood loss during surgery. It works by helping your body make more red blood cells, which reduces the need for blood transfusions. This medicine may be used for other purposes; ask your health care provider or pharmacist if you have questions. COMMON BRAND NAME(S): Epogen, Procrit, Retacrit What should I tell my care team before I take this medication? They need to know if you have any of these conditions: Blood clots Cancer Heart disease High blood pressure On dialysis Seizures Stroke An unusual or allergic reaction to epoetin alfa, albumin, benzyl alcohol, other  medications, foods, dyes, or preservatives Pregnant or trying to get pregnant Breast-feeding How should I use this medication? This medication is injected into a vein or under the skin. It is usually given by your care team in a hospital or clinic setting. It may also be given at home. If you get this medication at home, you will be taught how to prepare and give it. Use exactly as directed. Take it as directed on the prescription label at the same time every day. Keep taking it unless your care team tells you to stop. It is important that you put your used needles and syringes in a special sharps container. Do not put them in a trash can. If you do not have a sharps container, call your pharmacist or care team to get one. A special MedGuide will be given to you by the pharmacist with each prescription and refill. Be sure to read this information carefully each time. Talk to your care team about the use of this medication in children. While this medication may be used in children as young as 1 month of age for selected conditions, precautions do apply. Overdosage: If you think you have taken too much of this medicine contact a poison control center or emergency room at once. NOTE: This medicine is only for you. Do not share this medicine with others. What if I miss a dose? If you miss a dose, take it as soon as you can. If it is almost time for your next dose, take only that dose. Do   not take double or extra doses. What may interact with this medication? Darbepoetin alfa Methoxy polyethylene glycol-epoetin beta This list may not describe all possible interactions. Give your health care provider a list of all the medicines, herbs, non-prescription drugs, or dietary supplements you use. Also tell them if you smoke, drink alcohol, or use illegal drugs. Some items may interact with your medicine. What should I watch for while using this medication? Visit your care team for regular checks on your  progress. Check your blood pressure as directed. Know what your blood pressure should be and when to contact your care team. Your condition will be monitored carefully while you are receiving this medication. You may need blood work while taking this medication. What side effects may I notice from receiving this medication? Side effects that you should report to your care team as soon as possible: Allergic reactions--skin rash, itching, hives, swelling of the face, lips, tongue, or throat Blood clot--pain, swelling, or warmth in the leg, shortness of breath, chest pain Heart attack--pain or tightness in the chest, shoulders, arms, or jaw, nausea, shortness of breath, cold or clammy skin, feeling faint or lightheaded Increase in blood pressure Rash, fever, and swollen lymph nodes Redness, blistering, peeling, or loosening of the skin, including inside the mouth Seizures Stroke--sudden numbness or weakness of the face, arm, or leg, trouble speaking, confusion, trouble walking, loss of balance or coordination, dizziness, severe headache, change in vision Side effects that usually do not require medical attention (report to your care team if they continue or are bothersome): Bone, joint, or muscle pain Cough Headache Nausea Pain, redness, or irritation at injection site This list may not describe all possible side effects. Call your doctor for medical advice about side effects. You may report side effects to FDA at 1-800-FDA-1088. Where should I keep my medication? Keep out of the reach of children and pets. Store in a refrigerator. Do not freeze. Do not shake. Protect from light. Keep this medication in the original container until you are ready to take it. See product for storage information. Get rid of any unused medication after the expiration date. To get rid of medications that are no longer needed or have expired: Take the medication to a medication take-back program. Check with your  pharmacy or law enforcement to find a location. If you cannot return the medication, ask your pharmacist or care team how to get rid of the medication safely. NOTE: This sheet is a summary. It may not cover all possible information. If you have questions about this medicine, talk to your doctor, pharmacist, or health care provider.  2024 Elsevier/Gold Standard (2022-02-20 00:00:00)       To help prevent nausea and vomiting after your treatment, we encourage you to take your nausea medication as directed.  BELOW ARE SYMPTOMS THAT SHOULD BE REPORTED IMMEDIATELY: *FEVER GREATER THAN 100.4 F (38 C) OR HIGHER *CHILLS OR SWEATING *NAUSEA AND VOMITING THAT IS NOT CONTROLLED WITH YOUR NAUSEA MEDICATION *UNUSUAL SHORTNESS OF BREATH *UNUSUAL BRUISING OR BLEEDING *URINARY PROBLEMS (pain or burning when urinating, or frequent urination) *BOWEL PROBLEMS (unusual diarrhea, constipation, pain near the anus) TENDERNESS IN MOUTH AND THROAT WITH OR WITHOUT PRESENCE OF ULCERS (sore throat, sores in mouth, or a toothache) UNUSUAL RASH, SWELLING OR PAIN  UNUSUAL VAGINAL DISCHARGE OR ITCHING   Items with * indicate a potential emergency and should be followed up as soon as possible or go to the Emergency Department if any problems should occur.  Please show   the CHEMOTHERAPY ALERT CARD or IMMUNOTHERAPY ALERT CARD at check-in to the Emergency Department and triage nurse.  Should you have questions after your visit or need to cancel or reschedule your appointment, please contact [facility] [phone]  and follow the prompts.  Office hours are 8:00 a.m. to 4:30 p.m. Monday - Friday. Please note that voicemails left after 4:00 p.m. may not be returned until the following business day.  We are closed weekends and major holidays. You have access to a nurse at all times for urgent questions. Please call the main number to the clinic [phone] and follow the prompts.  For any non-urgent  questions, you may also contact your provider using MyChart. We now offer e-Visits for anyone 18 and older to request care online for non-urgent symptoms. For details visit [email or web address].   Also download the MyChart app! Go to the app store, search "MyChart", open the app, select [facility], and log in with your MyChart username and password.   

## 2023-04-20 ENCOUNTER — Other Ambulatory Visit: Payer: Self-pay

## 2023-04-20 DIAGNOSIS — N1832 Chronic kidney disease, stage 3b: Secondary | ICD-10-CM

## 2023-04-21 ENCOUNTER — Inpatient Hospital Stay: Payer: BC Managed Care – PPO

## 2023-04-21 ENCOUNTER — Inpatient Hospital Stay: Payer: BC Managed Care – PPO | Admitting: Oncology

## 2023-04-21 VITALS — BP 135/53 | HR 69 | Temp 96.8°F | Resp 18

## 2023-04-21 DIAGNOSIS — D631 Anemia in chronic kidney disease: Secondary | ICD-10-CM

## 2023-04-21 DIAGNOSIS — I129 Hypertensive chronic kidney disease with stage 1 through stage 4 chronic kidney disease, or unspecified chronic kidney disease: Secondary | ICD-10-CM | POA: Diagnosis not present

## 2023-04-21 DIAGNOSIS — D500 Iron deficiency anemia secondary to blood loss (chronic): Secondary | ICD-10-CM

## 2023-04-21 DIAGNOSIS — Z944 Liver transplant status: Secondary | ICD-10-CM

## 2023-04-21 LAB — CBC WITH DIFFERENTIAL/PLATELET
Abs Immature Granulocytes: 0 10*3/uL (ref 0.00–0.07)
Band Neutrophils: 3 %
Basophils Absolute: 0 10*3/uL (ref 0.0–0.1)
Basophils Relative: 1 %
Eosinophils Absolute: 0.1 10*3/uL (ref 0.0–0.5)
Eosinophils Relative: 5 %
HCT: 30.5 % — ABNORMAL LOW (ref 39.0–52.0)
Hemoglobin: 9.4 g/dL — ABNORMAL LOW (ref 13.0–17.0)
Lymphocytes Relative: 45 %
Lymphs Abs: 0.8 10*3/uL (ref 0.7–4.0)
MCH: 31.3 pg (ref 26.0–34.0)
MCHC: 30.8 g/dL (ref 30.0–36.0)
MCV: 101.7 fL — ABNORMAL HIGH (ref 80.0–100.0)
Monocytes Absolute: 0.1 10*3/uL (ref 0.1–1.0)
Monocytes Relative: 6 %
Neutro Abs: 0.7 10*3/uL — ABNORMAL LOW (ref 1.7–7.7)
Neutrophils Relative %: 40 %
Platelets: 130 10*3/uL — ABNORMAL LOW (ref 150–400)
RBC: 3 MIL/uL — ABNORMAL LOW (ref 4.22–5.81)
RDW: 15.3 % (ref 11.5–15.5)
WBC: 1.7 10*3/uL — ABNORMAL LOW (ref 4.0–10.5)
nRBC: 0 % (ref 0.0–0.2)

## 2023-04-21 LAB — COMPREHENSIVE METABOLIC PANEL
ALT: 11 U/L (ref 0–44)
AST: 11 U/L — ABNORMAL LOW (ref 15–41)
Albumin: 3.7 g/dL (ref 3.5–5.0)
Alkaline Phosphatase: 53 U/L (ref 38–126)
Anion gap: 5 (ref 5–15)
BUN: 47 mg/dL — ABNORMAL HIGH (ref 8–23)
CO2: 19 mmol/L — ABNORMAL LOW (ref 22–32)
Calcium: 8.2 mg/dL — ABNORMAL LOW (ref 8.9–10.3)
Chloride: 112 mmol/L — ABNORMAL HIGH (ref 98–111)
Creatinine, Ser: 1.49 mg/dL — ABNORMAL HIGH (ref 0.61–1.24)
GFR, Estimated: 52 mL/min — ABNORMAL LOW (ref >60)
Glucose, Bld: 115 mg/dL — ABNORMAL HIGH (ref 70–99)
Potassium: 4.9 mmol/L (ref 3.5–5.1)
Sodium: 136 mmol/L (ref 135–145)
Total Bilirubin: 0.6 mg/dL (ref 0.3–1.2)
Total Protein: 6.1 g/dL — ABNORMAL LOW (ref 6.5–8.1)

## 2023-04-21 LAB — MAGNESIUM: Magnesium: 1.9 mg/dL (ref 1.7–2.4)

## 2023-04-21 MED ORDER — EPOETIN ALFA-EPBX 20000 UNIT/ML IJ SOLN
20000.0000 [IU] | Freq: Once | INTRAMUSCULAR | Status: AC
  Administered 2023-04-21: 20000 [IU] via SUBCUTANEOUS
  Filled 2023-04-21: qty 1

## 2023-04-21 NOTE — Progress Notes (Signed)
HGB 9.4. Michael Harmon presents today for injection per the provider's orders.  Retacrit administration without incident; injection site WNL; see MAR for injection details.  Patient tolerated procedure well and without incident.  No questions or complaints noted at this time. Discharged from clinic ambulatory in stable condition. Alert and oriented x 3. F/U with [facility] as scheduled.

## 2023-04-21 NOTE — Patient Instructions (Signed)
[facility]  Discharge Instructions: Thank you for choosing [facility] to provide your oncology and hematology care.  If you have a lab appointment with the Cancer Center - please note that after April 8th, 2024, all labs will be drawn in the cancer center.  You do not have to check in or register with the main entrance as you have in the past but will complete your check-in in the cancer center.  Wear comfortable clothing and clothing appropriate for easy access to any Portacath or PICC line.   We strive to give you quality time with your provider. You may need to reschedule your appointment if you arrive late (15 or more minutes).  Arriving late affects you and other patients whose appointments are after yours.  Also, if you miss three or more appointments without notifying the office, you may be dismissed from the clinic at the provider's discretion.      For prescription refill requests, have your pharmacy contact our office and allow 72 hours for refills to be completed.    Today you received the following chemotherapy and/or immunotherapy agents Retacrit injection.       To help prevent nausea and vomiting after your treatment, we encourage you to take your nausea medication as directed.  BELOW ARE SYMPTOMS THAT SHOULD BE REPORTED IMMEDIATELY: *FEVER GREATER THAN 100.4 F (38 C) OR HIGHER *CHILLS OR SWEATING *NAUSEA AND VOMITING THAT IS NOT CONTROLLED WITH YOUR NAUSEA MEDICATION *UNUSUAL SHORTNESS OF BREATH *UNUSUAL BRUISING OR BLEEDING *URINARY PROBLEMS (pain or burning when urinating, or frequent urination) *BOWEL PROBLEMS (unusual diarrhea, constipation, pain near the anus) TENDERNESS IN MOUTH AND THROAT WITH OR WITHOUT PRESENCE OF ULCERS (sore throat, sores in mouth, or a toothache) UNUSUAL RASH, SWELLING OR PAIN  UNUSUAL VAGINAL DISCHARGE OR ITCHING   Items with * indicate a potential emergency and should be followed up as soon as possible or  go to the Emergency Department if any problems should occur.  Please show the CHEMOTHERAPY ALERT CARD or IMMUNOTHERAPY ALERT CARD at check-in to the Emergency Department and triage nurse.  Should you have questions after your visit or need to cancel or reschedule your appointment, please contact [facility] CENTER AT [facility] [phone]  and follow the prompts.  Office hours are 8:00 a.m. to 4:30 p.m. Monday - Friday. Please note that voicemails left after 4:00 p.m. may not be returned until the following business day.  We are closed weekends and major holidays. You have access to a nurse at all times for urgent questions. Please call the main number to the clinic [phone] and follow the prompts.  For any non-urgent questions, you may also contact your provider using MyChart. We now offer e-Visits for anyone 36 and older to request care online for non-urgent symptoms. For details visit [email or web address].   Also download the MyChart app! Go to the app store, search "MyChart", open the app, select [place], and log in with your MyChart username and password.

## 2023-04-23 LAB — TACROLIMUS LEVEL: Tacrolimus (FK506) - LabCorp: 3.4 ng/mL (ref 2.0–20.0)

## 2023-04-28 ENCOUNTER — Inpatient Hospital Stay: Payer: BC Managed Care – PPO

## 2023-04-28 VITALS — BP 131/59 | HR 63 | Temp 97.9°F | Resp 20

## 2023-04-28 DIAGNOSIS — D500 Iron deficiency anemia secondary to blood loss (chronic): Secondary | ICD-10-CM

## 2023-04-28 DIAGNOSIS — D631 Anemia in chronic kidney disease: Secondary | ICD-10-CM

## 2023-04-28 DIAGNOSIS — I129 Hypertensive chronic kidney disease with stage 1 through stage 4 chronic kidney disease, or unspecified chronic kidney disease: Secondary | ICD-10-CM | POA: Diagnosis not present

## 2023-04-28 LAB — CBC WITH DIFFERENTIAL/PLATELET
Abs Immature Granulocytes: 0.11 10*3/uL — ABNORMAL HIGH (ref 0.00–0.07)
Basophils Absolute: 0 10*3/uL (ref 0.0–0.1)
Basophils Relative: 1 %
Eosinophils Absolute: 0.1 10*3/uL (ref 0.0–0.5)
Eosinophils Relative: 4 %
HCT: 30.7 % — ABNORMAL LOW (ref 39.0–52.0)
Hemoglobin: 9.6 g/dL — ABNORMAL LOW (ref 13.0–17.0)
Immature Granulocytes: 5 %
Lymphocytes Relative: 19 %
Lymphs Abs: 0.4 10*3/uL — ABNORMAL LOW (ref 0.7–4.0)
MCH: 31.7 pg (ref 26.0–34.0)
MCHC: 31.3 g/dL (ref 30.0–36.0)
MCV: 101.3 fL — ABNORMAL HIGH (ref 80.0–100.0)
Monocytes Absolute: 0.2 10*3/uL (ref 0.1–1.0)
Monocytes Relative: 8 %
Neutro Abs: 1.3 10*3/uL — ABNORMAL LOW (ref 1.7–7.7)
Neutrophils Relative %: 63 %
Platelets: 130 10*3/uL — ABNORMAL LOW (ref 150–400)
RBC: 3.03 MIL/uL — ABNORMAL LOW (ref 4.22–5.81)
RDW: 15.4 % (ref 11.5–15.5)
WBC: 2.1 10*3/uL — ABNORMAL LOW (ref 4.0–10.5)
nRBC: 0 % (ref 0.0–0.2)

## 2023-04-28 MED ORDER — EPOETIN ALFA-EPBX 20000 UNIT/ML IJ SOLN
20000.0000 [IU] | Freq: Once | INTRAMUSCULAR | Status: AC
  Administered 2023-04-28: 20000 [IU] via SUBCUTANEOUS
  Filled 2023-04-28: qty 1

## 2023-04-28 NOTE — Progress Notes (Signed)
Patient presents today for Retacrit injection. Hemoglobin reviewed prior to administration. VSS tolerated without incident or complaint. See MAR for details. Patient stable during and after injection. Patient discharged in satisfactory condition with no s/s of distress noted.  

## 2023-04-28 NOTE — Patient Instructions (Signed)
[facility] [place]  Discharge Instructions: Thank you for choosing [facility] to provide your oncology and hematology care.  If you have a lab appointment with the Cancer Center - please note that after April 8th, 2024, all labs will be drawn in the cancer center.  You do not have to check in or register with the main entrance as you have in the past but will complete your check-in in the cancer center.  Wear comfortable clothing and clothing appropriate for easy access to any Portacath or PICC line.   We strive to give you quality time with your provider. You may need to reschedule your appointment if you arrive late (15 or more minutes).  Arriving late affects you and other patients whose appointments are after yours.  Also, if you miss three or more appointments without notifying the office, you may be dismissed from the clinic at the provider's discretion.      For prescription refill requests, have your pharmacy contact our office and allow 72 hours for refills to be completed.    Today you received the following Retacrit, return as scheduled.   To help prevent nausea and vomiting after your treatment, we encourage you to take your nausea medication as directed.  BELOW ARE SYMPTOMS THAT SHOULD BE REPORTED IMMEDIATELY: *FEVER GREATER THAN 100.4 F (38 C) OR HIGHER *CHILLS OR SWEATING *NAUSEA AND VOMITING THAT IS NOT CONTROLLED WITH YOUR NAUSEA MEDICATION *UNUSUAL SHORTNESS OF BREATH *UNUSUAL BRUISING OR BLEEDING *URINARY PROBLEMS (pain or burning when urinating, or frequent urination) *BOWEL PROBLEMS (unusual diarrhea, constipation, pain near the anus) TENDERNESS IN MOUTH AND THROAT WITH OR WITHOUT PRESENCE OF ULCERS (sore throat, sores in mouth, or a toothache) UNUSUAL RASH, SWELLING OR PAIN  UNUSUAL VAGINAL DISCHARGE OR ITCHING   Items with * indicate a potential emergency and should be followed up as soon as possible or go to the Emergency Department if  any problems should occur.  Please show the CHEMOTHERAPY ALERT CARD or IMMUNOTHERAPY ALERT CARD at check-in to the Emergency Department and triage nurse.  Should you have questions after your visit or need to cancel or reschedule your appointment, please contact [facility] [place] [phone]  and follow the prompts.  Office hours are 8:00 a.m. to 4:30 p.m. Monday - Friday. Please note that voicemails left after 4:00 p.m. may not be returned until the following business day.  We are closed weekends and major holidays. You have access to a nurse at all times for urgent questions. Please call the main number to the clinic [phone] and follow the prompts.  For any non-urgent questions, you may also contact your provider using MyChart. We now offer e-Visits for anyone 18 and older to request care online for non-urgent symptoms. For details visit [email or web address].   Also download the MyChart app! Go to the app store, search "MyChart", open the app, select , and log in with your MyChart username and password.   

## 2023-05-04 ENCOUNTER — Other Ambulatory Visit (HOSPITAL_COMMUNITY)
Admission: RE | Admit: 2023-05-04 | Discharge: 2023-05-04 | Disposition: A | Discharge status: Home / Self Care | Payer: BC Managed Care – PPO | Source: Ambulatory Visit

## 2023-05-04 DIAGNOSIS — D649 Anemia, unspecified: Secondary | ICD-10-CM | POA: Diagnosis not present

## 2023-05-04 DIAGNOSIS — D849 Immunodeficiency, unspecified: Secondary | ICD-10-CM | POA: Insufficient documentation

## 2023-05-04 DIAGNOSIS — Z944 Liver transplant status: Secondary | ICD-10-CM | POA: Insufficient documentation

## 2023-05-04 LAB — COMPREHENSIVE METABOLIC PANEL
ALT: 13 U/L (ref 0–44)
AST: 12 U/L — ABNORMAL LOW (ref 15–41)
Albumin: 3.6 g/dL (ref 3.5–5.0)
Alkaline Phosphatase: 70 U/L (ref 38–126)
Anion gap: 5 (ref 5–15)
BUN: 29 mg/dL — ABNORMAL HIGH (ref 8–23)
CO2: 19 mmol/L — ABNORMAL LOW (ref 22–32)
Calcium: 8.3 mg/dL — ABNORMAL LOW (ref 8.9–10.3)
Chloride: 115 mmol/L — ABNORMAL HIGH (ref 98–111)
Creatinine, Ser: 1.64 mg/dL — ABNORMAL HIGH (ref 0.61–1.24)
GFR, Estimated: 47 mL/min — ABNORMAL LOW (ref >60)
Glucose, Bld: 87 mg/dL (ref 70–99)
Potassium: 5.2 mmol/L — ABNORMAL HIGH (ref 3.5–5.1)
Sodium: 139 mmol/L (ref 135–145)
Total Bilirubin: 0.7 mg/dL (ref 0.3–1.2)
Total Protein: 5.9 g/dL — ABNORMAL LOW (ref 6.5–8.1)

## 2023-05-04 LAB — CBC WITH DIFFERENTIAL/PLATELET
Abs Immature Granulocytes: 0 10*3/uL (ref 0.00–0.07)
Basophils Absolute: 0 10*3/uL (ref 0.0–0.1)
Basophils Relative: 1 %
Eosinophils Absolute: 0.1 10*3/uL (ref 0.0–0.5)
Eosinophils Relative: 6 %
HCT: 34.9 % — ABNORMAL LOW (ref 39.0–52.0)
Hemoglobin: 10.6 g/dL — ABNORMAL LOW (ref 13.0–17.0)
Immature Granulocytes: 0 %
Lymphocytes Relative: 45 %
Lymphs Abs: 0.7 10*3/uL (ref 0.7–4.0)
MCH: 31.6 pg (ref 26.0–34.0)
MCHC: 30.4 g/dL (ref 30.0–36.0)
MCV: 104.2 fL — ABNORMAL HIGH (ref 80.0–100.0)
Monocytes Absolute: 0.2 10*3/uL (ref 0.1–1.0)
Monocytes Relative: 15 %
Neutro Abs: 0.5 10*3/uL — ABNORMAL LOW (ref 1.7–7.7)
Neutrophils Relative %: 33 %
Platelets: 112 10*3/uL — ABNORMAL LOW (ref 150–400)
RBC: 3.35 MIL/uL — ABNORMAL LOW (ref 4.22–5.81)
RDW: 15.7 % — ABNORMAL HIGH (ref 11.5–15.5)
WBC: 1.6 10*3/uL — ABNORMAL LOW (ref 4.0–10.5)
nRBC: 0 % (ref 0.0–0.2)

## 2023-05-04 LAB — MAGNESIUM: Magnesium: 1.7 mg/dL (ref 1.7–2.4)

## 2023-05-05 ENCOUNTER — Inpatient Hospital Stay: Payer: BC Managed Care – PPO

## 2023-05-05 ENCOUNTER — Inpatient Hospital Stay: Payer: BC Managed Care – PPO | Attending: Physician Assistant

## 2023-05-05 VITALS — BP 139/58 | HR 74 | Temp 97.5°F | Resp 18

## 2023-05-05 DIAGNOSIS — D631 Anemia in chronic kidney disease: Secondary | ICD-10-CM | POA: Insufficient documentation

## 2023-05-05 DIAGNOSIS — N1832 Chronic kidney disease, stage 3b: Secondary | ICD-10-CM | POA: Diagnosis present

## 2023-05-05 MED ORDER — EPOETIN ALFA-EPBX 20000 UNIT/ML IJ SOLN
20000.0000 [IU] | Freq: Once | INTRAMUSCULAR | Status: AC
  Administered 2023-05-05: 20000 [IU] via SUBCUTANEOUS
  Filled 2023-05-05: qty 1

## 2023-05-05 NOTE — Patient Instructions (Signed)
[facility] [place]  Discharge Instructions: Thank you for choosing [facility] to provide your oncology and hematology care.  If you have a lab appointment with the Cancer Center - please note that after April 8th, 2024, all labs will be drawn in the cancer center.  You do not have to check in or register with the main entrance as you have in the past but will complete your check-in in the cancer center.  Wear comfortable clothing and clothing appropriate for easy access to any Portacath or PICC line.   We strive to give you quality time with your provider. You may need to reschedule your appointment if you arrive late (15 or more minutes).  Arriving late affects you and other patients whose appointments are after yours.  Also, if you miss three or more appointments without notifying the office, you may be dismissed from the clinic at the provider's discretion.      For prescription refill requests, have your pharmacy contact our office and allow 72 hours for refills to be completed.  To help prevent nausea and vomiting after your treatment, we encourage you to take your nausea medication as directed.  BELOW ARE SYMPTOMS THAT SHOULD BE REPORTED IMMEDIATELY: *FEVER GREATER THAN 100.4 F (38 C) OR HIGHER *CHILLS OR SWEATING *NAUSEA AND VOMITING THAT IS NOT CONTROLLED WITH YOUR NAUSEA MEDICATION *UNUSUAL SHORTNESS OF BREATH *UNUSUAL BRUISING OR BLEEDING *URINARY PROBLEMS (pain or burning when urinating, or frequent urination) *BOWEL PROBLEMS (unusual diarrhea, constipation, pain near the anus) TENDERNESS IN MOUTH AND THROAT WITH OR WITHOUT PRESENCE OF ULCERS (sore throat, sores in mouth, or a toothache) UNUSUAL RASH, SWELLING OR PAIN  UNUSUAL VAGINAL DISCHARGE OR ITCHING   Items with * indicate a potential emergency and should be followed up as soon as possible or go to the Emergency Department if any problems should occur.  Please show the CHEMOTHERAPY ALERT CARD or  IMMUNOTHERAPY ALERT CARD at check-in to the Emergency Department and triage nurse.  Should you have questions after your visit or need to cancel or reschedule your appointment, please contact [facility] [place] [phone]  and follow the prompts.  Office hours are 8:00 a.m. to 4:30 p.m. Monday - Friday. Please note that voicemails left after 4:00 p.m. may not be returned until the following business day.  We are closed weekends and major holidays. You have access to a nurse at all times for urgent questions. Please call the main number to the clinic [phone] and follow the prompts.  For any non-urgent questions, you may also contact your provider using MyChart. We now offer e-Visits for anyone 18 and older to request care online for non-urgent symptoms. For details visit [email or web address].   Also download the MyChart app! Go to the app store, search "MyChart", open the app, select [place], and log in with your MyChart username and password.   

## 2023-05-05 NOTE — Progress Notes (Signed)
Patient presents today for Retacrit injection. Hemoglobin reviewed prior to administration. VSS. Injection tolerated without incident or complaint. See MAR for details. Patient stable during and after injection.  Patient discharged in satisfactory condition with no s/s of distress noted.    

## 2023-05-07 LAB — TACROLIMUS LEVEL: Tacrolimus (FK506) - LabCorp: 7.1 ng/mL (ref 2.0–20.0)

## 2023-05-11 ENCOUNTER — Encounter: Payer: Self-pay | Admitting: Internal Medicine

## 2023-05-11 ENCOUNTER — Ambulatory Visit: Payer: BC Managed Care – PPO | Admitting: Internal Medicine

## 2023-05-11 ENCOUNTER — Other Ambulatory Visit: Payer: Self-pay

## 2023-05-11 VITALS — BP 136/68 | HR 76 | Temp 98.6°F | Ht 67.0 in | Wt 175.6 lb

## 2023-05-11 DIAGNOSIS — R197 Diarrhea, unspecified: Secondary | ICD-10-CM | POA: Diagnosis not present

## 2023-05-11 DIAGNOSIS — D631 Anemia in chronic kidney disease: Secondary | ICD-10-CM

## 2023-05-11 NOTE — Progress Notes (Unsigned)
Primary Care Physician:  [name], MD Primary Gastroenterologist:  Dr. Jena Gauss  Pre-Procedure History & Physical: HPI:  Michael Harmon is a 62 y.o. male here for follow-up.  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  He has done well very well postoperatively except for diarrhea.  Had diarrhea ongoing since surgery.  Watery.  Nonbloody.  Intermittent.  Slows down a bit with Imodium.  His immunosuppressive regimen has been modified for this reason; also, leukopenia.  C. difficile negative immediately postop. He is developed some aphthous ulcers which correlate with exposure to gluten as he is very aware of this disease and its effects on his body over the years.  He states [facility] pharmacy was going to check his new medication regimen to make sure there is nothing gluten containing.  He has not heard back.  It is notable he had some diarrhea back in 2017 and biopsies at that time were negative for microscopic colitis.  He has not had any nausea or vomiting.  Oral intake is very good.  He is reaching his milestones posttransplant.  Past Medical History:  Diagnosis Date   Allergy    nose and sinus problems   Anemia    Anemia in chronic kidney disease (CKD) 11/23/2022   Asthma    Celiac disease    Cirrhosis, cryptogenic (HCC)    completed Hep A and B vaccines   DM (diabetes mellitus) (HCC)    Encounter for general adult medical examination with abnormal findings 09/08/2022   GERD (gastroesophageal reflux disease)    Hyperlipidemia    diet controlled now with 100 pound weight loss   Hypertension    Iron deficiency anemia due to chronic blood loss 08/13/2021   Malignant neoplasm of prostate (HCC) 10/03/2021   Murmur, heart 03/02/2022   Narrow angle glaucoma suspect of both eyes    Renal cyst 09/03/2017   right   Situational depression 07/06/2017   Splenomegaly    Thrombocytopenia (HCC)    hematology, ?related to chol med (tricor) and glimeripide, just being  monitored, above 100,000    Past Surgical History:  Procedure Laterality Date   BIOPSY  07/09/2016   Procedure: BIOPSY;  Surgeon: Corbin Ade, MD;  [facility];  Service: Endoscopy;;  duodenal, gastric, esophaageal   BIOPSY  07/31/2021   Procedure: BIOPSY;  Surgeon: Corbin Ade, MD;  [facility];  Service: Endoscopy;;   CLOSED MANIPULATION SHOULDER     COLONOSCOPY  11/2015   Dr. Allena Katz: cecal bx neg for microscopic colitis. ascending colon polyp (adenomatous)   COLONOSCOPY WITH PROPOFOL N/A 07/31/2021   Procedure: COLONOSCOPY WITH PROPOFOL;  Surgeon: Corbin Ade, MD;  [facility];  Service: Endoscopy;  Laterality: N/A;  7:30am   ESOPHAGEAL BANDING N/A 08/04/2017   Procedure: ESOPHAGEAL BANDING;  Surgeon: Corbin Ade, MD;  [facility];  Service: Endoscopy;  Laterality: N/A;  esophageal varices banding   ESOPHAGEAL BANDING N/A 10/13/2017   Procedure: ESOPHAGEAL BANDING;  Surgeon: Corbin Ade, MD;  [facility];  Service: Endoscopy;  Laterality: N/A;   ESOPHAGEAL BANDING N/A 04/09/2020   Procedure: ESOPHAGEAL BANDING;  Surgeon: Corbin Ade, MD;  [facility];  Service: Endoscopy;  Laterality: N/A;   ESOPHAGOGASTRODUODENOSCOPY N/A 07/09/2016   Procedure: ESOPHAGOGASTRODUODENOSCOPY (EGD);  Surgeon: Corbin Ade, MD;  [facility];  Service: Endoscopy;  Laterality: N/A;  730   ESOPHAGOGASTRODUODENOSCOPY N/A 08/04/2017  Procedure: ESOPHAGOGASTRODUODENOSCOPY (EGD);  Surgeon: Corbin Ade, MD;  [facility];  Service: Endoscopy;  Laterality: N/A;  7:30am   ESOPHAGOGASTRODUODENOSCOPY N/A 10/13/2017   Dr. Jena Gauss: grade 2 esophageal varices status post banding, portal hypertensive gastropathy   ESOPHAGOGASTRODUODENOSCOPY  11/23/2019   Renarda Mullinix: Esophageal varices, 4 columns of grade 2-3 status post banding.  Varices more prominent than seen in December 2018.  Portal gastropathy.  Normal-appearing  small bowel.   ESOPHAGOGASTRODUODENOSCOPY (EGD) WITH PROPOFOL N/A 04/09/2020   Dr. Jena Gauss: 4 columns of grade 2/grade 3 esophageal varices somewhat recalcitrant.  Status post esophageal band ligation today.  Portal gastropathy.  Normal-appearing small bowel.   ESOPHAGOGASTRODUODENOSCOPY (EGD) WITH PROPOFOL N/A 07/31/2021   Procedure: ESOPHAGOGASTRODUODENOSCOPY (EGD) WITH PROPOFOL;  Surgeon: Corbin Ade, MD;  [facility];  Service: Endoscopy;  Laterality: N/A;   GOLD SEED IMPLANT N/A 01/15/2022   Procedure: GOLD SEED IMPLANT;  Surgeon: Malen Gauze, MD;  Location: [facility];  Service: Urology;  Laterality: N/A;   LIVER TRANSPLANT  01/16/2023   SPACE OAR INSTILLATION N/A 01/15/2022   Procedure: SPACE OAR INSTILLATION;  Surgeon: Malen Gauze, MD;  Location: [facility];  Service: Urology;  Laterality: N/A;   US PARACENTESIS  06/10/2022    Prior to Admission medications   Medication Sig Start Date End Date Taking? Authorizing Provider  amLODipine (NORVASC) 5 MG tablet Take by mouth. 04/09/23 04/08/24 Yes [provider]  aspirin EC 81 MG tablet Take by mouth. 01/18/23  Yes [provider]  BD PEN NEEDLE NANO 2ND GEN 32G X 4 MM MISC  08/06/21  Yes [provider]  Continuous Blood Gluc Receiver (FREESTYLE LIBRE 2 READER) DEVI USE AS DIRECTED TO TEST GLUCOSE LEVELS AT LEAST 4 TIMES DAILY E11.9 09/29/21  Yes [provider]  Continuous Blood Gluc Sensor (FREESTYLE LIBRE 2 SENSOR) MISC USE AS DIRECTED FOR BLOOD SUGAR MONITORING. CHANGE EVERY 14 DAYS 08/10/21  Yes [provider]  cyanocobalamin (VITAMIN B12) 500 MCG tablet Take 1 tablet (500 mcg total) by mouth daily. 03/30/23  Yes Pennington, Rebekah M, PA-C  hydrocortisone (ANUSOL-HC) 25 MG suppository Place 1 suppository (25 mg total) rectally 2 (two) times daily as needed for hemorrhoids or anal itching. 03/02/22  Yes [name], MD  mycophenolate (MYFORTIC) 180 MG EC tablet Take 360 mg by  mouth 2 (two) times daily.   Yes [provider]  pantoprazole (PROTONIX) 40 MG tablet Take 40 mg by mouth 2 (two) times daily.   Yes [provider]  PREDNISONE PO Take 7.5 mg by mouth daily. 03/08/23  Yes [provider]  Specialty Vitamins Products (MG PLUS PROTEIN) 133 MG TABS Take by mouth. 03/08/23  Yes [provider]  tacrolimus (PROGRAF) 1 MG capsule Take 4 mg by mouth 2 (two) times daily. 03/03/23 03/02/24 Yes [provider]  tamsulosin (FLOMAX) 0.4 MG CAPS capsule Take by mouth. 04/14/23 05/14/23 Yes [provider]  valGANciclovir (VALCYTE) 450 MG tablet Take by mouth daily.   Yes [provider]  Glucagon (GVOKE HYPOPEN 2-PACK) 1 MG/0.2ML SOAJ INJECT 1 MG AS NEEDED BY SUBCUTANEOUS ROUTE. Patient not taking: Reported on 05/11/2023    [provider]  Insulin Aspart FlexPen (NOVOLOG) 100 UNIT/ML Inject into the skin. Patient not taking: Reported on 05/11/2023 02/11/23 05/12/23  [provider]    Allergies as of 05/11/2023 - Review Complete 05/11/2023  Allergen Reaction Noted   Penicillins Other (See Comments) 04/07/2016   Gluten meal Diarrhea  10/06/2017   Biaxin [clarithromycin] Tinitus    Prednisone Hives 11/21/2019    Family History  Problem Relation Age of Onset   Other Mother        chronic diarrhea   COPD Mother    Heart disease Mother        died at 76   Arthritis Mother    Cancer Mother    Depression Mother    Diabetes Mother    Hyperlipidemia Mother    Hypertension Mother    Stroke Mother    Arthritis Father    Asthma Father    Birth defects Father    Heart disease Father        aortic valve replaced   Heart disease Maternal Grandmother    Stroke Maternal Grandmother    Heart disease Maternal Grandfather    Stroke Maternal Grandfather    Diabetes Paternal Grandmother    Stroke Paternal Grandfather    Heart disease Paternal Grandfather    Liver disease Neg Hx    Colon cancer Neg Hx      Social History   Socioeconomic History   Marital status: Married    Spouse name: Melissa   Number of children: 0   Years of education: 14   Highest education level: Not on file  Occupational History   Occupation: Lexicographer, 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   Has no children.   Eats all food groups.   Works for [employer]       Social Determinants of Health   Financial Resource Strain: Not on file  Food Insecurity: Not on file  Transportation Needs: Not on file  Physical Activity: Not on file  Stress: Not on file  Social Connections: Not on file  Intimate Partner Violence: Not on file    Review of Systems: See HPI, otherwise negative ROS  Physical Exam: BP 136/68 (BP Location: Right Arm, Patient Position: Sitting, Cuff Size: Normal)   Pulse 76   Temp 98.6 F (37 C) (Oral)   Ht 5\' 7"  (1.702 m)   Wt 175 lb 9.6 oz (79.7 kg)   SpO2 97%   BMI 27.50 kg/m  General:   Alert,  pleasant and cooperative in NAD Lungs:  Clear throughout to auscultation.   No wheezes, crackles, or rhonchi. No acute distress. Heart:  Regular rate and rhythm; no murmurs, clicks, rubs,  or gallops. Abdomen: Horizontal surgical scar.  Abdomen is flat with a large amount of redundant skin.  Abdomen is entirely soft and nontender Pulses:  Normal pulses noted. Extremities:  Without clubbing or edema.  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  He has had a fairly unremarkable posttransplant course except for persistent non- bloody watery diarrhea. Differential  includes side effects of his new medication regimen.  Surreptitious exposure to gluten.  Infectious etiology less likely.  Moreover, microscopic colitis possible but felt to be less likely in this clinical setting. He has aphthous ulcers which has correlated well with gluten exposure previously in this nice gentleman.  Recommendations:  take  up to 8 Imodium daily for diarrhea.  In an abundance of caution, we will go ahead and double check your stool to make sure you are not harboring an infection causing diarrhea (GIP and C. difficile)  We will double check with the [facility] transplant folks to make sure there is no gluten in your new medical regimen.  Further recommendations to follow.  Office visit with [place] in 2 months.   Notice: This dictation was prepared with Dragon dictation along with smaller phrase technology. Any transcriptional errors that result from this process are unintentional and may not be corrected upon review.

## 2023-05-11 NOTE — Patient Instructions (Addendum)
It was great to see you again today!   Congratulations on your new liver!  You may take up to 8 Imodium daily for diarrhea.  In an abundance of caution we will go ahead and double check your stool to make sure you are not harboring an infection causing diarrhea (GIP and C. difficile)  We will double check with the [facility] transplant folks to make sure there is no gluten in your new medical regimen.  Further recommendations to follow.  Office visit with [place] in 2 months.

## 2023-05-12 ENCOUNTER — Inpatient Hospital Stay: Payer: BC Managed Care – PPO

## 2023-05-12 ENCOUNTER — Other Ambulatory Visit: Payer: Self-pay | Admitting: Physician Assistant

## 2023-05-12 VITALS — BP 141/58 | HR 69 | Temp 98.5°F | Resp 18

## 2023-05-12 DIAGNOSIS — N1832 Chronic kidney disease, stage 3b: Secondary | ICD-10-CM

## 2023-05-12 LAB — CBC
HCT: 36.3 % — ABNORMAL LOW (ref 39.0–52.0)
Hemoglobin: 10.8 g/dL — ABNORMAL LOW (ref 13.0–17.0)
MCH: 31 pg (ref 26.0–34.0)
MCHC: 29.8 g/dL — ABNORMAL LOW (ref 30.0–36.0)
MCV: 104.3 fL — ABNORMAL HIGH (ref 80.0–100.0)
Platelets: 112 10*3/uL — ABNORMAL LOW (ref 150–400)
RBC: 3.48 MIL/uL — ABNORMAL LOW (ref 4.22–5.81)
RDW: 14.7 % (ref 11.5–15.5)
WBC: 2.2 10*3/uL — ABNORMAL LOW (ref 4.0–10.5)
nRBC: 0 % (ref 0.0–0.2)

## 2023-05-12 MED ORDER — EPOETIN ALFA-EPBX 20000 UNIT/ML IJ SOLN
20000.0000 [IU] | Freq: Once | INTRAMUSCULAR | Status: AC
  Administered 2023-05-12: 20000 [IU] via SUBCUTANEOUS
  Filled 2023-05-12: qty 1

## 2023-05-12 NOTE — Progress Notes (Signed)
Michael Harmon presents today for Retacrit injection per the provider's orders.  Stable during administration without incident; injection site WNL; see MAR for injection details.  Patient tolerated procedure well and without incident.  No questions or complaints noted at this time.  

## 2023-05-12 NOTE — Progress Notes (Signed)
Due to significant improvement in Hgb, we will switch to Retacrit 20,000 units every 2 weeks rather than weekly.  [name], PA-C  05/12/23 1:43 PM

## 2023-05-12 NOTE — Patient Instructions (Signed)
[facility] [place]  Discharge Instructions: Thank you for choosing [facility] to provide your oncology and hematology care.  If you have a lab appointment with the Cancer Center - please note that after April 8th, 2024, all labs will be drawn in the cancer center.  You do not have to check in or register with the main entrance as you have in the past but will complete your check-in in the cancer center.  Wear comfortable clothing and clothing appropriate for easy access to any Portacath or PICC line.   We strive to give you quality time with your provider. You may need to reschedule your appointment if you arrive late (15 or more minutes).  Arriving late affects you and other patients whose appointments are after yours.  Also, if you miss three or more appointments without notifying the office, you may be dismissed from the clinic at the provider's discretion.      For prescription refill requests, have your pharmacy contact our office and allow 72 hours for refills to be completed.    Today you received the following chemotherapy and/or immunotherapy agents Retacrit      To help prevent nausea and vomiting after your treatment, we encourage you to take your nausea medication as directed.  BELOW ARE SYMPTOMS THAT SHOULD BE REPORTED IMMEDIATELY: *FEVER GREATER THAN 100.4 F (38 C) OR HIGHER *CHILLS OR SWEATING *NAUSEA AND VOMITING THAT IS NOT CONTROLLED WITH YOUR NAUSEA MEDICATION *UNUSUAL SHORTNESS OF BREATH *UNUSUAL BRUISING OR BLEEDING *URINARY PROBLEMS (pain or burning when urinating, or frequent urination) *BOWEL PROBLEMS (unusual diarrhea, constipation, pain near the anus) TENDERNESS IN MOUTH AND THROAT WITH OR WITHOUT PRESENCE OF ULCERS (sore throat, sores in mouth, or a toothache) UNUSUAL RASH, SWELLING OR PAIN  UNUSUAL VAGINAL DISCHARGE OR ITCHING   Items with * indicate a potential emergency and should be followed up as soon as possible or go to the  Emergency Department if any problems should occur.  Please show the CHEMOTHERAPY ALERT CARD or IMMUNOTHERAPY ALERT CARD at check-in to the Emergency Department and triage nurse.  Should you have questions after your visit or need to cancel or reschedule your appointment, please contact [facility] [place] [phone]  and follow the prompts.  Office hours are 8:00 a.m. to 4:30 p.m. Monday - Friday. Please note that voicemails left after 4:00 p.m. may not be returned until the following business day.  We are closed weekends and major holidays. You have access to a nurse at all times for urgent questions. Please call the main number to the clinic [phone] and follow the prompts.  For any non-urgent questions, you may also contact your provider using MyChart. We now offer e-Visits for anyone 18 and older to request care online for non-urgent symptoms. For details visit [email or web address].   Also download the MyChart app! Go to the app store, search "MyChart", open the app, select [place], and log in with your MyChart username and password.   

## 2023-05-14 ENCOUNTER — Other Ambulatory Visit (HOSPITAL_COMMUNITY)
Admission: RE | Admit: 2023-05-14 | Discharge: 2023-05-14 | Disposition: A | Discharge status: Home / Self Care | Payer: BC Managed Care – PPO | Source: Ambulatory Visit

## 2023-05-14 DIAGNOSIS — Z944 Liver transplant status: Secondary | ICD-10-CM | POA: Insufficient documentation

## 2023-05-14 LAB — CBC WITH DIFFERENTIAL/PLATELET
Abs Immature Granulocytes: 0 10*3/uL (ref 0.00–0.07)
Band Neutrophils: 2 %
Basophils Absolute: 0 10*3/uL (ref 0.0–0.1)
Basophils Relative: 1 %
Eosinophils Absolute: 0 10*3/uL (ref 0.0–0.5)
Eosinophils Relative: 2 %
HCT: 38.4 % — ABNORMAL LOW (ref 39.0–52.0)
Hemoglobin: 11.6 g/dL — ABNORMAL LOW (ref 13.0–17.0)
Lymphocytes Relative: 40 %
Lymphs Abs: 0.8 10*3/uL (ref 0.7–4.0)
MCH: 31.4 pg (ref 26.0–34.0)
MCHC: 30.2 g/dL (ref 30.0–36.0)
MCV: 104.1 fL — ABNORMAL HIGH (ref 80.0–100.0)
Monocytes Absolute: 0.1 10*3/uL (ref 0.1–1.0)
Monocytes Relative: 6 %
Neutro Abs: 1 10*3/uL — ABNORMAL LOW (ref 1.7–7.7)
Neutrophils Relative %: 49 %
Platelets: 100 10*3/uL — ABNORMAL LOW (ref 150–400)
RBC: 3.69 MIL/uL — ABNORMAL LOW (ref 4.22–5.81)
RDW: 14.5 % (ref 11.5–15.5)
WBC: 2 10*3/uL — ABNORMAL LOW (ref 4.0–10.5)
nRBC: 0 % (ref 0.0–0.2)

## 2023-05-14 LAB — COMPREHENSIVE METABOLIC PANEL
ALT: 18 U/L (ref 0–44)
AST: 13 U/L — ABNORMAL LOW (ref 15–41)
Albumin: 3.4 g/dL — ABNORMAL LOW (ref 3.5–5.0)
Alkaline Phosphatase: 66 U/L (ref 38–126)
Anion gap: 6 (ref 5–15)
BUN: 27 mg/dL — ABNORMAL HIGH (ref 8–23)
CO2: 20 mmol/L — ABNORMAL LOW (ref 22–32)
Calcium: 8.2 mg/dL — ABNORMAL LOW (ref 8.9–10.3)
Chloride: 111 mmol/L (ref 98–111)
Creatinine, Ser: 1.7 mg/dL — ABNORMAL HIGH (ref 0.61–1.24)
GFR, Estimated: 45 mL/min — ABNORMAL LOW (ref >60)
Glucose, Bld: 88 mg/dL (ref 70–99)
Potassium: 5.4 mmol/L — ABNORMAL HIGH (ref 3.5–5.1)
Sodium: 137 mmol/L (ref 135–145)
Total Bilirubin: 0.6 mg/dL (ref 0.3–1.2)
Total Protein: 5.6 g/dL — ABNORMAL LOW (ref 6.5–8.1)

## 2023-05-14 LAB — MAGNESIUM: Magnesium: 1.8 mg/dL (ref 1.7–2.4)

## 2023-05-15 LAB — GI PROFILE, STOOL, PCR

## 2023-05-17 LAB — TACROLIMUS LEVEL: Tacrolimus (FK506) - LabCorp: 8.1 ng/mL (ref 2.0–20.0)

## 2023-05-19 ENCOUNTER — Inpatient Hospital Stay: Payer: BC Managed Care – PPO

## 2023-05-21 LAB — C DIFFICILE, CYTOTOXIN B

## 2023-05-21 LAB — C DIFFICILE TOXINS A+B W/RFLX: C difficile Toxins A+B, EIA: NEGATIVE

## 2023-05-25 ENCOUNTER — Other Ambulatory Visit: Payer: Self-pay

## 2023-05-25 DIAGNOSIS — N1832 Chronic kidney disease, stage 3b: Secondary | ICD-10-CM

## 2023-05-26 ENCOUNTER — Inpatient Hospital Stay: Payer: BC Managed Care – PPO

## 2023-05-26 DIAGNOSIS — N1832 Chronic kidney disease, stage 3b: Secondary | ICD-10-CM | POA: Diagnosis not present

## 2023-05-26 DIAGNOSIS — D631 Anemia in chronic kidney disease: Secondary | ICD-10-CM

## 2023-05-26 LAB — CBC
HCT: 39.3 % (ref 39.0–52.0)
Hemoglobin: 11.9 g/dL — ABNORMAL LOW (ref 13.0–17.0)
MCH: 31.5 pg (ref 26.0–34.0)
MCHC: 30.3 g/dL (ref 30.0–36.0)
MCV: 104 fL — ABNORMAL HIGH (ref 80.0–100.0)
Platelets: 110 10*3/uL — ABNORMAL LOW (ref 150–400)
RBC: 3.78 MIL/uL — ABNORMAL LOW (ref 4.22–5.81)
RDW: 13.7 % (ref 11.5–15.5)
WBC: 1.6 10*3/uL — ABNORMAL LOW (ref 4.0–10.5)
nRBC: 0 % (ref 0.0–0.2)

## 2023-05-26 NOTE — Progress Notes (Signed)
Hemoglobin is 11.9 , no retacrit needed today per protocol.

## 2023-05-31 ENCOUNTER — Other Ambulatory Visit (HOSPITAL_COMMUNITY)
Admission: RE | Admit: 2023-05-31 | Discharge: 2023-05-31 | Disposition: A | Discharge status: Home / Self Care | Payer: BC Managed Care – PPO | Source: Ambulatory Visit

## 2023-05-31 DIAGNOSIS — Z944 Liver transplant status: Secondary | ICD-10-CM | POA: Diagnosis present

## 2023-05-31 LAB — CBC WITH DIFFERENTIAL/PLATELET
Abs Immature Granulocytes: 0 10*3/uL (ref 0.00–0.07)
Basophils Absolute: 0 10*3/uL (ref 0.0–0.1)
Basophils Relative: 0 %
Eosinophils Absolute: 0 10*3/uL (ref 0.0–0.5)
Eosinophils Relative: 2 %
HCT: 38.2 % — ABNORMAL LOW (ref 39.0–52.0)
Hemoglobin: 11.6 g/dL — ABNORMAL LOW (ref 13.0–17.0)
Lymphocytes Relative: 57 %
Lymphs Abs: 1.1 10*3/uL (ref 0.7–4.0)
MCH: 31.4 pg (ref 26.0–34.0)
MCHC: 30.4 g/dL (ref 30.0–36.0)
MCV: 103.2 fL — ABNORMAL HIGH (ref 80.0–100.0)
Monocytes Absolute: 0.1 10*3/uL (ref 0.1–1.0)
Monocytes Relative: 6 %
Neutro Abs: 0.7 10*3/uL — ABNORMAL LOW (ref 1.7–7.7)
Neutrophils Relative %: 35 %
Platelets: 100 10*3/uL — ABNORMAL LOW (ref 150–400)
RBC: 3.7 MIL/uL — ABNORMAL LOW (ref 4.22–5.81)
RDW: 13.5 % (ref 11.5–15.5)
WBC: 1.9 10*3/uL — ABNORMAL LOW (ref 4.0–10.5)
nRBC: 0 % (ref 0.0–0.2)

## 2023-05-31 LAB — COMPREHENSIVE METABOLIC PANEL
ALT: 14 U/L (ref 0–44)
AST: 13 U/L — ABNORMAL LOW (ref 15–41)
Albumin: 3.3 g/dL — ABNORMAL LOW (ref 3.5–5.0)
Alkaline Phosphatase: 60 U/L (ref 38–126)
Anion gap: 4 — ABNORMAL LOW (ref 5–15)
BUN: 38 mg/dL — ABNORMAL HIGH (ref 8–23)
CO2: 22 mmol/L (ref 22–32)
Calcium: 8.1 mg/dL — ABNORMAL LOW (ref 8.9–10.3)
Chloride: 110 mmol/L (ref 98–111)
Creatinine, Ser: 1.61 mg/dL — ABNORMAL HIGH (ref 0.61–1.24)
GFR, Estimated: 48 mL/min — ABNORMAL LOW (ref >60)
Glucose, Bld: 97 mg/dL (ref 70–99)
Potassium: 5.5 mmol/L — ABNORMAL HIGH (ref 3.5–5.1)
Sodium: 136 mmol/L (ref 135–145)
Total Bilirubin: 0.8 mg/dL (ref 0.3–1.2)
Total Protein: 5.4 g/dL — ABNORMAL LOW (ref 6.5–8.1)

## 2023-05-31 LAB — MAGNESIUM: Magnesium: 1.8 mg/dL (ref 1.7–2.4)

## 2023-06-02 ENCOUNTER — Inpatient Hospital Stay: Payer: BC Managed Care – PPO

## 2023-06-08 ENCOUNTER — Other Ambulatory Visit: Payer: Self-pay

## 2023-06-08 DIAGNOSIS — D631 Anemia in chronic kidney disease: Secondary | ICD-10-CM

## 2023-06-09 ENCOUNTER — Inpatient Hospital Stay: Payer: BC Managed Care – PPO | Attending: Physician Assistant

## 2023-06-09 ENCOUNTER — Inpatient Hospital Stay: Payer: BC Managed Care – PPO

## 2023-06-09 VITALS — BP 139/57 | HR 73 | Temp 96.6°F | Resp 16

## 2023-06-09 DIAGNOSIS — D509 Iron deficiency anemia, unspecified: Secondary | ICD-10-CM | POA: Diagnosis not present

## 2023-06-09 DIAGNOSIS — D631 Anemia in chronic kidney disease: Secondary | ICD-10-CM | POA: Diagnosis not present

## 2023-06-09 DIAGNOSIS — D61818 Other pancytopenia: Secondary | ICD-10-CM | POA: Insufficient documentation

## 2023-06-09 DIAGNOSIS — Z944 Liver transplant status: Secondary | ICD-10-CM | POA: Diagnosis not present

## 2023-06-09 DIAGNOSIS — N1832 Chronic kidney disease, stage 3b: Secondary | ICD-10-CM

## 2023-06-09 DIAGNOSIS — E611 Iron deficiency: Secondary | ICD-10-CM | POA: Diagnosis not present

## 2023-06-09 DIAGNOSIS — R161 Splenomegaly, not elsewhere classified: Secondary | ICD-10-CM | POA: Diagnosis not present

## 2023-06-09 DIAGNOSIS — K7460 Unspecified cirrhosis of liver: Secondary | ICD-10-CM | POA: Insufficient documentation

## 2023-06-09 LAB — CBC
HCT: 35.1 % — ABNORMAL LOW (ref 39.0–52.0)
Hemoglobin: 10.7 g/dL — ABNORMAL LOW (ref 13.0–17.0)
MCH: 31.5 pg (ref 26.0–34.0)
MCHC: 30.5 g/dL (ref 30.0–36.0)
MCV: 103.2 fL — ABNORMAL HIGH (ref 80.0–100.0)
Platelets: 114 10*3/uL — ABNORMAL LOW (ref 150–400)
RBC: 3.4 MIL/uL — ABNORMAL LOW (ref 4.22–5.81)
RDW: 13.8 % (ref 11.5–15.5)
WBC: 2 10*3/uL — ABNORMAL LOW (ref 4.0–10.5)
nRBC: 0 % (ref 0.0–0.2)

## 2023-06-09 MED ORDER — EPOETIN ALFA-EPBX 20000 UNIT/ML IJ SOLN
20000.0000 [IU] | Freq: Once | INTRAMUSCULAR | Status: AC
  Administered 2023-06-09: 20000 [IU] via SUBCUTANEOUS
  Filled 2023-06-09: qty 1

## 2023-06-09 NOTE — Patient Instructions (Signed)
[facility]  Discharge Instructions: Thank you for choosing [facility] to provide your oncology and hematology care.  If you have a lab appointment with the Cancer Center - please note that after April 8th, 2024, all labs will be drawn in the cancer center.  You do not have to check in or register with the main entrance as you have in the past but will complete your check-in in the cancer center.  Wear comfortable clothing and clothing appropriate for easy access to any Portacath or PICC line.   We strive to give you quality time with your provider. You may need to reschedule your appointment if you arrive late (15 or more minutes).  Arriving late affects you and other patients whose appointments are after yours.  Also, if you miss three or more appointments without notifying the office, you may be dismissed from the clinic at the provider's discretion.      For prescription refill requests, have your pharmacy contact our office and allow 72 hours for refills to be completed.    Today you received the following chemotherapy and/or immunotherapy agents Retacrit.  Your Hemaglobin today was 10.7      To help prevent nausea and vomiting after your treatment, we encourage you to take your nausea medication as directed.  BELOW ARE SYMPTOMS THAT SHOULD BE REPORTED IMMEDIATELY: *FEVER GREATER THAN 100.4 F (38 C) OR HIGHER *CHILLS OR SWEATING *NAUSEA AND VOMITING THAT IS NOT CONTROLLED WITH YOUR NAUSEA MEDICATION *UNUSUAL SHORTNESS OF BREATH *UNUSUAL BRUISING OR BLEEDING *URINARY PROBLEMS (pain or burning when urinating, or frequent urination) *BOWEL PROBLEMS (unusual diarrhea, constipation, pain near the anus) TENDERNESS IN MOUTH AND THROAT WITH OR WITHOUT PRESENCE OF ULCERS (sore throat, sores in mouth, or a toothache) UNUSUAL RASH, SWELLING OR PAIN  UNUSUAL VAGINAL DISCHARGE OR ITCHING   Items with * indicate a potential emergency and should be followed up as  soon as possible or go to the Emergency Department if any problems should occur.  Please show the CHEMOTHERAPY ALERT CARD or IMMUNOTHERAPY ALERT CARD at check-in to the Emergency Department and triage nurse.  Should you have questions after your visit or need to cancel or reschedule your appointment, please contact [facility] [phone]  and follow the prompts.  Office hours are 8:00 a.m. to 4:30 p.m. Monday - Friday. Please note that voicemails left after 4:00 p.m. may not be returned until the following business day.  We are closed weekends and major holidays. You have access to a nurse at all times for urgent questions. Please call the main number to the clinic [phone] and follow the prompts.  For any non-urgent questions, you may also contact your provider using MyChart. We now offer e-Visits for anyone 9 and older to request care online for non-urgent symptoms. For details visit [email or web address].   Also download the MyChart app! Go to the app store, search "MyChart", open the app, select [place], and log in with your MyChart username and password.

## 2023-06-09 NOTE — Progress Notes (Signed)
Patient presented today for Retacrit injection per MD orders. Hgb today is 10.7, patient will receive injection.  See MAR for details.  Patient remained stable during injection.  He was discharged ambulatory and in stable condition from clinic. He is aware of his follow up appointments.

## 2023-06-16 ENCOUNTER — Inpatient Hospital Stay: Payer: BC Managed Care – PPO

## 2023-06-22 ENCOUNTER — Other Ambulatory Visit: Payer: Self-pay

## 2023-06-22 DIAGNOSIS — N1832 Chronic kidney disease, stage 3b: Secondary | ICD-10-CM

## 2023-06-22 DIAGNOSIS — D500 Iron deficiency anemia secondary to blood loss (chronic): Secondary | ICD-10-CM

## 2023-06-22 NOTE — Progress Notes (Unsigned)
[facility] [address], [place] [address]   CLINIC:  Medical Oncology/Hematology  PCP:  [name], MD [address] 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  He was last seen by Rojelio Brenner PA-C on 03/30/2023.  At today's visit, he reports feeling fairly well.  He is tolerating Retacrit well.  He reports increased blood pressure at home - usually around 140/60, but recently increased SBP around 160, which he attributes to back and leg pain.  Blood pressure in clinic today is 163/68.  He denies any current symptoms concerning for DVT or PE.  His energy continues to improve following liver transplant.  He continues to bruise easily, but notes that he has been on aspirin s/p transplant.  He denies any obvious bleeding such as epistaxis, hematochezia, melena, or hematuria.  He denies any new lumps or bumps.  He has not had any recent infections.  He denies any B symptoms.  He is taking his Vitamin B12 500 mcg daily.  He has 75% energy and 100% appetite. He is starting to regain some weight after his transplant surgery.  ASSESSMENT & PLAN:  1.  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 - CT scan (11/14/2021 at [facility]) showed enlarged spleen measuring 17 cm - Work-up negative for other causes of thrombocytopenia - normal B12, methylmalonic acid, folate, copper; negative rheumatoid factor/ANA; no abnormal platelets noted on pathology smear review - Received platelet transfusion x1 on 08/18/2021 prior to prostate biopsy.  Blood transfusion x1 on 01/15/2022 prior to prostate SpaceOAR placement is - Bone marrow biopsy (02/10/2022) shows hypercellular marrow for age with trilineage lineage hematopoiesis.  No increase in blasts.  No evidence of metastatic carcinoma. - Liver transplant at [facility] on 01/16/2023 - Labs today (06/23/2023): Platelets 90, WBC 1.5/ANC 0.5  - DIFFERENTIAL DIAGNOSIS: Thrombocytopenia and leukopenia likely secondary to cirrhosis (s/p liver transplant 01/16/2023) and splenic sequestration.  Unclear if patient has any underlying ITP in addition to this. Discussed with patient that spleen size may not fully returned to normal after liver transplantation, and therefore platelet count may or may not fully return to normal - PLAN: Platelets are improved following liver transplantation.  No indication for treatment at this time.  We will continue to monitor with periodic CBCs. - Patient's Myfortic dose was recently decreased in half (as of 05/31/2023) by transplant team due to neutropenia.   2.  Normocytic anemia, secondary to CKD + iron deficiency  - Bone marrow biopsy (02/10/2022) shows hypercellular marrow for age with trilineage lineage hematopoiesis.  No increase in blasts.  No evidence of metastatic carcinoma. - EGDs at [facility] in April 2023  in October 2023 showed esophageal varices (s/p banding), old blood in the stomach, and portal hypertensive gastropathy  - Other work-up (08/18/2021) showed normal reticulocytes; normal B12, methylmalonic acid, folate, and copper.   Immunofixation and SPEP negative.  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 Jena Gauss - He received IV Venofer x1000 mg, last dose given 09/03/2021 - Reports PRBC transfusion x2 during hospitalization at [facility] in June/July 2023 - Retacrit 10,000 units every 2 weeks started 10/22/2022 (interrupted due to liver transplant 01/16/2023).  Retacrit restarted on 04/14/2023.  Current dose is 20,000 units every 2 weeks. - Most recent labs (06/23/2023):  CBC: Hgb 11.0/MCV 104.1 Vitamin B12 improved at 845, MMA pending  Creatinine 1.75/GFR 43 (CKD stage IIIb) Ferritin 160, iron saturation 38% - He denies any gross blood loss such as epistaxis, rectal bleeding, melena, or hematuria. - Energy levels are improved.  No pica, chest pain, or difficulty breathing. - DIFFERENTIAL DIAGNOSIS: Anemia secondary to CKD, further complicated by malabsorption (celiac disease), chronic GI bleeding - PLAN: No indication for IV iron at this time. - We will DECREASE Retacrit to 10,000 units every 4 weeks. - Continue vitamin B12 500 mcg daily.  (Recheck B12/MMA in 3 months) - Labs (CBC/D, CMP, ferritin, iron/TIBC, B12, MMA) in 3 months with OFFICE visit 1 week after labs  3.  Cirrhosis with esophageal varices - S/P LIVER TRANSPLANT 01/16/2023 - Diagnosed in 2017 - Patient had EGD/colonoscopy on 07/31/2021 which showed portal colopathy as well as gastric erosions and grade 3 varices, which were not banded due to low platelet count - EGD (12/26/2021 via [facility]): Grade 2 esophageal varices, banded x3; old blood noted in stomach; mild portal hypertensive gastropathy - Patient follows with hepatology at [facility] - Liver transplant at [facility] on 01/16/2023 NOTE: Patient's post-transplant nurse coordinator at [facility] is [facility]  [phone])    4.  Stage T1c adenocarcinoma of the prostate, Gleason 3+4   - Diagnosed in October  2022 with prostate biopsy after abnormal PSA (8.2) - Following with urologist (Dr. Mena Goes / Dr. Ronne Binning) and has also been seen by radiation oncology (Ashlyn Bruning PA-C / Dr. Margaretmary Dys) - He had SpaceOAR placement on 01/15/2022 - Patient is undergoing XRT in [place], which was completed on 03/13/2022. - He is receiving Lupron shots and following with urology and radiation oncology  PLAN SUMMARY: >> CBC + Retacrit every 4 weeks (next on 07/21/2023) >> Labs in 3 months = CBC/D, CMP, ferritin, iron/TIBC, B12, MMA >> OFFICE visit in 3 months (1 week after lab panel)     REVIEW OF SYSTEMS:   Review of Systems  Constitutional:  Negative for appetite change, chills, diaphoresis, fatigue, fever and unexpected weight change.  HENT:   Negative for lump/mass and nosebleeds.   Eyes:  Negative for eye problems.  Respiratory:  Negative for cough, hemoptysis and shortness of breath.   Cardiovascular:  Positive for leg swelling. Negative for chest pain and palpitations.  Gastrointestinal:  Positive for diarrhea. Negative for abdominal pain, blood in stool, constipation, nausea and vomiting.  Genitourinary:  Positive for frequency (Incomplete bladder emptying). Negative for hematuria.   Skin: Negative.   Neurological:  Negative for dizziness, headaches and light-headedness.  Hematological:  Does not bruise/bleed easily.  Psychiatric/Behavioral:  Positive for sleep disturbance.      PHYSICAL EXAM:  ECOG PERFORMANCE STATUS: 1 - Symptomatic but completely ambulatory  There were no vitals filed  for this visit. There were no vitals filed for this visit. Physical Exam Constitutional:      Appearance: Normal appearance. He is obese.  Cardiovascular:     Heart sounds: Normal heart sounds.  Pulmonary:     Breath sounds: Normal breath sounds.  Musculoskeletal:     Right lower leg: Edema (ankle edema) present.     Left lower leg: Edema (ankle edema) present.  Neurological:     General: No  focal deficit present.     Mental Status: Mental status is at baseline.  Psychiatric:        Behavior: Behavior normal. Behavior is cooperative.     PAST MEDICAL/SURGICAL HISTORY:  Past Medical History:  Diagnosis Date   Allergy    nose and sinus problems   Anemia    Anemia in chronic kidney disease (CKD) 11/23/2022   Asthma    Celiac disease    Cirrhosis, cryptogenic (HCC)    completed Hep A and B vaccines   DM (diabetes mellitus) (HCC)    Encounter for general adult medical examination with abnormal findings 09/08/2022   GERD (gastroesophageal reflux disease)    Hyperlipidemia    diet controlled now with 100 pound weight loss   Hypertension    Iron deficiency anemia due to chronic blood loss 08/13/2021   Malignant neoplasm of prostate (HCC) 10/03/2021   Murmur, heart 03/02/2022   Narrow angle glaucoma suspect of both eyes    Renal cyst 09/03/2017   right   Situational depression 07/06/2017   Splenomegaly    Thrombocytopenia (HCC)    hematology, ?related to chol med (tricor) and glimeripide, just being monitored, above 100,000   Past Surgical History:  Procedure Laterality Date   BIOPSY  07/09/2016   Procedure: BIOPSY;  Surgeon: Corbin Ade, MD;  [facility];  Service: Endoscopy;;  duodenal, gastric, esophaageal   BIOPSY  07/31/2021   Procedure: BIOPSY;  Surgeon: Corbin Ade, MD;  [facility];  Service: Endoscopy;;   CLOSED MANIPULATION SHOULDER     COLONOSCOPY  11/2015   Dr. Allena Katz: cecal bx neg for microscopic colitis. ascending colon polyp (adenomatous)   COLONOSCOPY WITH PROPOFOL N/A 07/31/2021   Procedure: COLONOSCOPY WITH PROPOFOL;  Surgeon: Corbin Ade, MD;  [facility]: [facility];  Service: Endoscopy;  Laterality: N/A;  7:30am   ESOPHAGEAL BANDING N/A 08/04/2017   Procedure: ESOPHAGEAL BANDING;  Surgeon: Corbin Ade, MD;  [facility];  Service: Endoscopy;  Laterality: N/A;  esophageal varices banding   ESOPHAGEAL  BANDING N/A 10/13/2017   Procedure: ESOPHAGEAL BANDING;  Surgeon: Corbin Ade, MD;  [facility];  Service: Endoscopy;  Laterality: N/A;   ESOPHAGEAL BANDING N/A 04/09/2020   Procedure: ESOPHAGEAL BANDING;  Surgeon: Corbin Ade, MD;  [facility];  Service: Endoscopy;  Laterality: N/A;   ESOPHAGOGASTRODUODENOSCOPY N/A 07/09/2016   Procedure: ESOPHAGOGASTRODUODENOSCOPY (EGD);  Surgeon: Corbin Ade, MD;  [facility];  Service: Endoscopy;  Laterality: N/A;  730   ESOPHAGOGASTRODUODENOSCOPY N/A 08/04/2017   Procedure: ESOPHAGOGASTRODUODENOSCOPY (EGD);  Surgeon: Corbin Ade, MD;  [facility];  Service: Endoscopy;  Laterality: N/A;  7:30am   ESOPHAGOGASTRODUODENOSCOPY N/A 10/13/2017   Dr. Jena Gauss: grade 2 esophageal varices status post banding, portal hypertensive gastropathy   ESOPHAGOGASTRODUODENOSCOPY  11/23/2019   [name]: Esophageal varices, 4 columns of grade 2-3 status post banding.  Varices more prominent than seen in December 2018.  Portal gastropathy.  Normal-appearing small  bowel.   ESOPHAGOGASTRODUODENOSCOPY (EGD) WITH PROPOFOL N/A 04/09/2020   Dr. Jena Gauss: 4 columns of grade 2/grade 3 esophageal varices somewhat recalcitrant.  Status post esophageal band ligation today.  Portal gastropathy.  Normal-appearing small bowel.   ESOPHAGOGASTRODUODENOSCOPY (EGD) WITH PROPOFOL N/A 07/31/2021   Procedure: ESOPHAGOGASTRODUODENOSCOPY (EGD) WITH PROPOFOL;  Surgeon: Corbin Ade, MD;  [facility];  Service: Endoscopy;  Laterality: N/A;   GOLD SEED IMPLANT N/A 01/15/2022   Procedure: GOLD SEED IMPLANT;  Surgeon: Malen Gauze, MD;  Location: [facility];  Service: Urology;  Laterality: N/A;   LIVER TRANSPLANT  01/16/2023   SPACE OAR INSTILLATION N/A 01/15/2022   Procedure: SPACE OAR INSTILLATION;  Surgeon: Malen Gauze, MD;  Location: [facility];  Service: Urology;  Laterality: N/A;   US PARACENTESIS  06/10/2022    SOCIAL HISTORY:   Social History   Socioeconomic History   Marital status: Married    Spouse name: Melissa   Number of children: 0   Years of education: 14   Highest education level: Not on file  Occupational History   Occupation: Lexicographer, 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   Has no children.   Eats all food groups.   Works for [employer]       Social Determinants of Health   Financial Resource Strain: Low Risk  (03/03/2023)   Received from [facility], [facility]   Overall Financial Resource Strain (CARDIA)    Difficulty of Paying Living Expenses: Not hard at all  Food Insecurity: No Food Insecurity (03/03/2023)   Received from [facility], [facility]   Hunger Vital Sign    Worried About Running Out of Food in the Last Year: Never true    Ran Out of Food in the Last Year: Never true  Transportation Needs: No Transportation Needs (03/03/2023)   Received from [facility], [facility]   [facility] - Transportation    In the past 12 months, has lack of transportation kept you from medical appointments or from getting medications?: No    Lack of Transportation (Non-Medical): No  Physical Activity: Not on file  Stress: Not on file  Social Connections: Not on file  Intimate Partner Violence: Not on file    FAMILY HISTORY:  Family History  Problem Relation Age of Onset   Other Mother        chronic diarrhea   COPD Mother    Heart disease Mother        died at 32   Arthritis Mother    Cancer Mother    Depression Mother     Diabetes Mother    Hyperlipidemia Mother    Hypertension Mother    Stroke Mother    Arthritis Father    Asthma Father    Birth defects Father    Heart disease Father        aortic valve replaced   Heart disease Maternal Grandmother    Stroke Maternal Grandmother    Heart disease Maternal Grandfather  Stroke Maternal Grandfather    Diabetes Paternal Grandmother    Stroke Paternal Grandfather    Heart disease Paternal Grandfather    Liver disease Neg Hx    Colon cancer Neg Hx     CURRENT MEDICATIONS:  Outpatient Encounter Medications as of 06/23/2023  Medication Sig   amLODipine (NORVASC) 5 MG tablet Take by mouth.   aspirin EC 81 MG tablet Take by mouth.   BD PEN NEEDLE NANO 2ND GEN 32G X 4 MM MISC    Continuous Blood Gluc Receiver (FREESTYLE LIBRE 2 READER) DEVI USE AS DIRECTED TO TEST GLUCOSE LEVELS AT LEAST 4 TIMES DAILY E11.9   Continuous Blood Gluc Sensor (FREESTYLE LIBRE 2 SENSOR) MISC USE AS DIRECTED FOR BLOOD SUGAR MONITORING. CHANGE EVERY 14 DAYS   cyanocobalamin (VITAMIN B12) 500 MCG tablet Take 1 tablet (500 mcg total) by mouth daily.   Glucagon (GVOKE HYPOPEN 2-PACK) 1 MG/0.2ML SOAJ INJECT 1 MG AS NEEDED BY SUBCUTANEOUS ROUTE. (Patient not taking: Reported on 05/11/2023)   hydrocortisone (ANUSOL-HC) 25 MG suppository Place 1 suppository (25 mg total) rectally 2 (two) times daily as needed for hemorrhoids or anal itching.   mycophenolate (MYFORTIC) 180 MG EC tablet Take 360 mg by mouth 2 (two) times daily.   pantoprazole (PROTONIX) 40 MG tablet Take 40 mg by mouth 2 (two) times daily.   PREDNISONE PO Take 7.5 mg by mouth daily.   Specialty Vitamins Products (MG PLUS PROTEIN) 133 MG TABS Take by mouth.   tacrolimus (PROGRAF) 1 MG capsule Take 4 mg by mouth 2 (two) times daily.   valGANciclovir (VALCYTE) 450 MG tablet Take by mouth daily.   No facility-administered encounter medications on file as of 06/23/2023.    ALLERGIES:  Allergies  Allergen Reactions    Penicillins Other (See Comments)    As a baby, unknown reaction Did it involve swelling of the face/tongue/throat, SOB, or low BP? Unknown Did it involve sudden or severe rash/hives, skin peeling, or any reaction on the inside of your mouth or nose? Unknown Did you need to seek medical attention at a hospital or doctor's office? Unknown When did it last happen?      infant allergy If all above answers are "NO", may proceed with cephalosporin use. .    Gluten Meal Diarrhea   Biaxin [Clarithromycin] Tinitus    Ears ringing, loss sense of smell   Prednisone Hives    Made blood sugar high    LABORATORY DATA:  I have reviewed the labs as listed.  CBC    Component Value Date/Time   WBC 2.0 (L) 06/09/2023 0902   RBC 3.40 (L) 06/09/2023 0902   HGB 10.7 (L) 06/09/2023 0902   HGB 11.1 (L) 09/02/2021 1655   HCT 35.1 (L) 06/09/2023 0902   HCT 32.9 (L) 09/02/2021 1655   PLT 114 (L) 06/09/2023 0902   PLT 43 (LL) 09/02/2021 1655   MCV 103.2 (H) 06/09/2023 0902   MCV 96 09/02/2021 1655   MCH 31.5 06/09/2023 0902   MCHC 30.5 06/09/2023 0902   RDW 13.8 06/09/2023 0902   RDW 14.0 09/02/2021 1655   LYMPHSABS 1.1 05/31/2023 0834   LYMPHSABS 1.2 09/02/2021 1655   MONOABS 0.1 05/31/2023 0834   EOSABS 0.0 05/31/2023 0834   EOSABS 0.3 09/02/2021 1655   BASOSABS 0.0 05/31/2023 0834   BASOSABS 0.0 09/02/2021 1655      Latest Ref Rng & Units 05/31/2023    8:34 AM 05/14/2023    8:26 AM 05/04/2023  8:03 AM  CMP  Glucose 70 - 99 mg/dL 97  88  87   BUN 8 - 23 mg/dL 38  27  29   Creatinine 0.61 - 1.24 mg/dL 3.66  4.40  3.47   Sodium 135 - 145 mmol/L 136  137  139   Potassium 3.5 - 5.1 mmol/L 5.5  5.4  5.2   Chloride 98 - 111 mmol/L 110  111  115   CO2 22 - 32 mmol/L 22  20  19    Calcium 8.9 - 10.3 mg/dL 8.1  8.2  8.3   Total Protein 6.5 - 8.1 g/dL 5.4  5.6  5.9   Total Bilirubin 0.3 - 1.2 mg/dL 0.8  0.6  0.7   Alkaline Phos 38 - 126 U/L 60  66  70   AST 15 - 41 U/L 13  13  12    ALT 0 - 44  U/L 14  18  13      DIAGNOSTIC IMAGING:  I have independently reviewed the relevant imaging and discussed with the patient.   WRAP UP:  All questions were answered. The patient knows to call the clinic with any problems, questions or concerns.  Medical decision making: Moderate  Time spent on visit: I spent 20 minutes counseling the patient face to face. The total time spent in the appointment was 30 minutes and more than 50% was on counseling.  [name], PA-C  06/23/23 12:48 PM

## 2023-06-23 ENCOUNTER — Other Ambulatory Visit: Payer: Self-pay | Admitting: *Deleted

## 2023-06-23 ENCOUNTER — Inpatient Hospital Stay: Payer: BC Managed Care – PPO

## 2023-06-23 ENCOUNTER — Inpatient Hospital Stay (HOSPITAL_BASED_OUTPATIENT_CLINIC_OR_DEPARTMENT_OTHER): Payer: BC Managed Care – PPO | Admitting: Physician Assistant

## 2023-06-23 VITALS — BP 163/68 | HR 73 | Temp 97.8°F | Resp 18 | Ht 67.0 in | Wt 177.0 lb

## 2023-06-23 DIAGNOSIS — D696 Thrombocytopenia, unspecified: Secondary | ICD-10-CM

## 2023-06-23 DIAGNOSIS — D61818 Other pancytopenia: Secondary | ICD-10-CM | POA: Diagnosis not present

## 2023-06-23 DIAGNOSIS — D849 Immunodeficiency, unspecified: Secondary | ICD-10-CM

## 2023-06-23 DIAGNOSIS — Z944 Liver transplant status: Secondary | ICD-10-CM

## 2023-06-23 DIAGNOSIS — D631 Anemia in chronic kidney disease: Secondary | ICD-10-CM

## 2023-06-23 DIAGNOSIS — N1832 Chronic kidney disease, stage 3b: Secondary | ICD-10-CM

## 2023-06-23 DIAGNOSIS — D500 Iron deficiency anemia secondary to blood loss (chronic): Secondary | ICD-10-CM

## 2023-06-23 LAB — CBC WITH DIFFERENTIAL/PLATELET
Abs Immature Granulocytes: 0 10*3/uL (ref 0.00–0.07)
Basophils Absolute: 0 10*3/uL (ref 0.0–0.1)
Basophils Relative: 0 %
Eosinophils Absolute: 0 10*3/uL (ref 0.0–0.5)
Eosinophils Relative: 2 %
HCT: 35.8 % — ABNORMAL LOW (ref 39.0–52.0)
Hemoglobin: 11 g/dL — ABNORMAL LOW (ref 13.0–17.0)
Lymphocytes Relative: 53 %
Lymphs Abs: 0.8 10*3/uL (ref 0.7–4.0)
MCH: 32 pg (ref 26.0–34.0)
MCHC: 30.7 g/dL (ref 30.0–36.0)
MCV: 104.1 fL — ABNORMAL HIGH (ref 80.0–100.0)
Monocytes Absolute: 0.2 10*3/uL (ref 0.1–1.0)
Monocytes Relative: 11 %
Neutro Abs: 0.5 10*3/uL — ABNORMAL LOW (ref 1.7–7.7)
Neutrophils Relative %: 34 %
Platelets: 90 10*3/uL — ABNORMAL LOW (ref 150–400)
RBC: 3.44 MIL/uL — ABNORMAL LOW (ref 4.22–5.81)
RDW: 15.8 % — ABNORMAL HIGH (ref 11.5–15.5)
WBC: 1.5 10*3/uL — ABNORMAL LOW (ref 4.0–10.5)
nRBC: 0 % (ref 0.0–0.2)

## 2023-06-23 LAB — COMPREHENSIVE METABOLIC PANEL
ALT: 15 U/L (ref 0–44)
AST: 12 U/L — ABNORMAL LOW (ref 15–41)
Albumin: 3.6 g/dL (ref 3.5–5.0)
Alkaline Phosphatase: 56 U/L (ref 38–126)
Anion gap: 7 (ref 5–15)
BUN: 46 mg/dL — ABNORMAL HIGH (ref 8–23)
CO2: 22 mmol/L (ref 22–32)
Calcium: 8.5 mg/dL — ABNORMAL LOW (ref 8.9–10.3)
Chloride: 108 mmol/L (ref 98–111)
Creatinine, Ser: 1.75 mg/dL — ABNORMAL HIGH (ref 0.61–1.24)
GFR, Estimated: 43 mL/min — ABNORMAL LOW (ref >60)
Glucose, Bld: 102 mg/dL — ABNORMAL HIGH (ref 70–99)
Potassium: 5.4 mmol/L — ABNORMAL HIGH (ref 3.5–5.1)
Sodium: 137 mmol/L (ref 135–145)
Total Bilirubin: 0.9 mg/dL (ref 0.3–1.2)
Total Protein: 5.6 g/dL — ABNORMAL LOW (ref 6.5–8.1)

## 2023-06-23 LAB — VITAMIN B12: Vitamin B-12: 845 pg/mL (ref 180–914)

## 2023-06-23 LAB — IRON AND TIBC
Iron: 95 ug/dL (ref 45–182)
Saturation Ratios: 38 % (ref 17.9–39.5)
TIBC: 251 ug/dL (ref 250–450)
UIBC: 156 ug/dL

## 2023-06-23 LAB — MAGNESIUM: Magnesium: 1.8 mg/dL (ref 1.7–2.4)

## 2023-06-23 LAB — FERRITIN: Ferritin: 160 ng/mL (ref 24–336)

## 2023-06-23 NOTE — Progress Notes (Signed)
Orders entered for Mag and tacrolimus level per Dr. Horton Marshall @ [facility] transplant team.

## 2023-06-23 NOTE — Patient Instructions (Signed)
[facility] at [facility] **VISIT SUMMARY & IMPORTANT INSTRUCTIONS **   You were seen today by Rojelio Brenner PA-C for your follow-up visit.   We will change your Retacrit injections to once every 4 weeks, since your blood levels have improved so well! Will continue to monitor your platelets and white blood cells. Continue to take vitamin B12 500 mcg daily.  FOLLOW-UP APPOINTMENT: 3 months  ** Thank you for trusting me with your healthcare!  I strive to provide all of my patients with quality care at each visit.  If you receive a survey for this visit, I would be so grateful to you for taking the time to provide feedback.  Thank you in advance!  ~ Parmvir Boomer                   Dr. Doreatha Massed   &   Rojelio Brenner, PA-C   - - - - - - - - - - - - - - - - - -    Thank you for choosing [facility] at [facility] to provide your oncology and hematology care.  To afford each patient quality time with our provider, please arrive at least 15 minutes before your scheduled appointment time.   If you have a lab appointment with the Cancer Center please come in thru the Main Entrance and check in at the main information desk.  You need to re-schedule your appointment should you arrive 10 or more minutes late.  We strive to give you quality time with our providers, and arriving late affects you and other patients whose appointments are after yours.  Also, if you no show three or more times for appointments you may be dismissed from the clinic at the providers discretion.     Again, thank you for choosing [facility].  Our hope is that these requests will decrease the amount of time that you wait before being seen by our physicians.       _____________________________________________________________  Should you have questions after your visit to [facility], please contact our office at [phone] and follow the  prompts.  Our office hours are 8:00 a.m. and 4:30 p.m. Monday - Friday.  Please note that voicemails left after 4:00 p.m. may not be returned until the following business day.  We are closed weekends and major holidays.  You do have access to a nurse 24-7, just call the main number to the clinic [phone] and do not press any options, hold on the line and a nurse will answer the phone.    For prescription refill requests, have your pharmacy contact our office and allow 72 hours.

## 2023-06-23 NOTE — Progress Notes (Signed)
Hemoglobin was 8.0 today, no retacrit needed today.

## 2023-06-25 LAB — TACROLIMUS LEVEL: Tacrolimus (FK506) - LabCorp: 6.9 ng/mL (ref 2.0–20.0)

## 2023-06-26 LAB — METHYLMALONIC ACID, SERUM: Methylmalonic Acid, Quantitative: 221 nmol/L (ref 0–378)

## 2023-06-28 ENCOUNTER — Encounter: Payer: Self-pay | Admitting: Internal Medicine

## 2023-07-07 ENCOUNTER — Other Ambulatory Visit (HOSPITAL_COMMUNITY)
Admission: RE | Admit: 2023-07-07 | Discharge: 2023-07-07 | Disposition: A | Discharge status: Home / Self Care | Payer: BC Managed Care – PPO | Source: Ambulatory Visit

## 2023-07-07 DIAGNOSIS — D849 Immunodeficiency, unspecified: Secondary | ICD-10-CM | POA: Insufficient documentation

## 2023-07-07 DIAGNOSIS — Z944 Liver transplant status: Secondary | ICD-10-CM | POA: Diagnosis present

## 2023-07-07 LAB — COMPREHENSIVE METABOLIC PANEL
ALT: 14 U/L (ref 0–44)
AST: 12 U/L — ABNORMAL LOW (ref 15–41)
Albumin: 3.2 g/dL — ABNORMAL LOW (ref 3.5–5.0)
Alkaline Phosphatase: 59 U/L (ref 38–126)
Anion gap: 8 (ref 5–15)
BUN: 39 mg/dL — ABNORMAL HIGH (ref 8–23)
CO2: 22 mmol/L (ref 22–32)
Calcium: 8.3 mg/dL — ABNORMAL LOW (ref 8.9–10.3)
Chloride: 108 mmol/L (ref 98–111)
Creatinine, Ser: 1.8 mg/dL — ABNORMAL HIGH (ref 0.61–1.24)
GFR, Estimated: 42 mL/min — ABNORMAL LOW (ref >60)
Glucose, Bld: 98 mg/dL (ref 70–99)
Potassium: 5.7 mmol/L — ABNORMAL HIGH (ref 3.5–5.1)
Sodium: 138 mmol/L (ref 135–145)
Total Bilirubin: 1 mg/dL (ref 0.3–1.2)
Total Protein: 5.2 g/dL — ABNORMAL LOW (ref 6.5–8.1)

## 2023-07-07 LAB — CBC WITH DIFFERENTIAL/PLATELET
Abs Immature Granulocytes: 0 10*3/uL (ref 0.00–0.07)
Basophils Absolute: 0 10*3/uL (ref 0.0–0.1)
Basophils Relative: 1 %
Eosinophils Absolute: 0 10*3/uL (ref 0.0–0.5)
Eosinophils Relative: 2 %
HCT: 30.8 % — ABNORMAL LOW (ref 39.0–52.0)
Hemoglobin: 9.7 g/dL — ABNORMAL LOW (ref 13.0–17.0)
Lymphocytes Relative: 48 %
Lymphs Abs: 0.7 10*3/uL (ref 0.7–4.0)
MCH: 33.1 pg (ref 26.0–34.0)
MCHC: 31.5 g/dL (ref 30.0–36.0)
MCV: 105.1 fL — ABNORMAL HIGH (ref 80.0–100.0)
Monocytes Absolute: 0.1 10*3/uL (ref 0.1–1.0)
Monocytes Relative: 7 %
Neutro Abs: 0.6 10*3/uL — ABNORMAL LOW (ref 1.7–7.7)
Neutrophils Relative %: 42 %
Platelets: 116 10*3/uL — ABNORMAL LOW (ref 150–400)
RBC: 2.93 MIL/uL — ABNORMAL LOW (ref 4.22–5.81)
RDW: 15.2 % (ref 11.5–15.5)
WBC: 1.5 10*3/uL — ABNORMAL LOW (ref 4.0–10.5)
nRBC: 0 % (ref 0.0–0.2)

## 2023-07-07 LAB — MAGNESIUM: Magnesium: 1.8 mg/dL (ref 1.7–2.4)

## 2023-07-09 LAB — TACROLIMUS LEVEL: Tacrolimus (FK506) - LabCorp: 7.8 ng/mL (ref 2.0–20.0)

## 2023-07-12 LAB — HM DIABETES EYE EXAM

## 2023-07-13 ENCOUNTER — Telehealth: Payer: Self-pay | Admitting: Internal Medicine

## 2023-07-13 NOTE — Telephone Encounter (Signed)
Medical records reviewed from [facility] transplant.  Seen 8/28.  Felt to have normal graft function.  Normal LFTs.  Waxing and waning lower extremity edema.  Diarrhea improved but not resolved.  Overall assessment he is progressing nicely status post orthotopic liver transplant.

## 2023-07-21 ENCOUNTER — Other Ambulatory Visit: Payer: Self-pay

## 2023-07-21 ENCOUNTER — Inpatient Hospital Stay: Payer: BC Managed Care – PPO

## 2023-07-21 ENCOUNTER — Inpatient Hospital Stay: Payer: BC Managed Care – PPO | Attending: Physician Assistant

## 2023-07-21 VITALS — BP 157/65 | HR 75 | Temp 97.6°F | Resp 18

## 2023-07-21 DIAGNOSIS — D509 Iron deficiency anemia, unspecified: Secondary | ICD-10-CM | POA: Insufficient documentation

## 2023-07-21 DIAGNOSIS — Z944 Liver transplant status: Secondary | ICD-10-CM

## 2023-07-21 DIAGNOSIS — E611 Iron deficiency: Secondary | ICD-10-CM | POA: Insufficient documentation

## 2023-07-21 DIAGNOSIS — D500 Iron deficiency anemia secondary to blood loss (chronic): Secondary | ICD-10-CM

## 2023-07-21 DIAGNOSIS — D631 Anemia in chronic kidney disease: Secondary | ICD-10-CM | POA: Diagnosis not present

## 2023-07-21 DIAGNOSIS — D696 Thrombocytopenia, unspecified: Secondary | ICD-10-CM | POA: Insufficient documentation

## 2023-07-21 DIAGNOSIS — N1832 Chronic kidney disease, stage 3b: Secondary | ICD-10-CM | POA: Diagnosis present

## 2023-07-21 LAB — COMPREHENSIVE METABOLIC PANEL WITH GFR
ALT: 19 U/L (ref 0–44)
AST: 16 U/L (ref 15–41)
Albumin: 2.9 g/dL — ABNORMAL LOW (ref 3.5–5.0)
Alkaline Phosphatase: 65 U/L (ref 38–126)
Anion gap: 7 (ref 5–15)
BUN: 31 mg/dL — ABNORMAL HIGH (ref 8–23)
CO2: 21 mmol/L — ABNORMAL LOW (ref 22–32)
Calcium: 8 mg/dL — ABNORMAL LOW (ref 8.9–10.3)
Chloride: 112 mmol/L — ABNORMAL HIGH (ref 98–111)
Creatinine, Ser: 2.17 mg/dL — ABNORMAL HIGH (ref 0.61–1.24)
GFR, Estimated: 33 mL/min — ABNORMAL LOW (ref >60)
Glucose, Bld: 79 mg/dL (ref 70–99)
Potassium: 5.6 mmol/L — ABNORMAL HIGH (ref 3.5–5.1)
Sodium: 140 mmol/L (ref 135–145)
Total Bilirubin: 0.6 mg/dL (ref 0.3–1.2)
Total Protein: 5.2 g/dL — ABNORMAL LOW (ref 6.5–8.1)

## 2023-07-21 LAB — CBC WITH DIFFERENTIAL/PLATELET
Abs Immature Granulocytes: 0 10*3/uL (ref 0.00–0.07)
Basophils Absolute: 0 10*3/uL (ref 0.0–0.1)
Basophils Relative: 1 %
Eosinophils Absolute: 0.1 10*3/uL (ref 0.0–0.5)
Eosinophils Relative: 4 %
HCT: 34.8 % — ABNORMAL LOW (ref 39.0–52.0)
Hemoglobin: 10.4 g/dL — ABNORMAL LOW (ref 13.0–17.0)
Lymphocytes Relative: 46 %
Lymphs Abs: 0.8 10*3/uL (ref 0.7–4.0)
MCH: 32.1 pg (ref 26.0–34.0)
MCHC: 29.9 g/dL — ABNORMAL LOW (ref 30.0–36.0)
MCV: 107.4 fL — ABNORMAL HIGH (ref 80.0–100.0)
Monocytes Absolute: 0.1 10*3/uL (ref 0.1–1.0)
Monocytes Relative: 5 %
Neutro Abs: 0.7 10*3/uL — ABNORMAL LOW (ref 1.7–7.7)
Neutrophils Relative %: 44 %
Platelets: 105 10*3/uL — ABNORMAL LOW (ref 150–400)
RBC: 3.24 MIL/uL — ABNORMAL LOW (ref 4.22–5.81)
RDW: 13.6 % (ref 11.5–15.5)
WBC: 1.7 10*3/uL — ABNORMAL LOW (ref 4.0–10.5)
nRBC: 0 % (ref 0.0–0.2)

## 2023-07-21 LAB — MAGNESIUM: Magnesium: 1.7 mg/dL (ref 1.7–2.4)

## 2023-07-21 MED ORDER — EPOETIN ALFA-EPBX 10000 UNIT/ML IJ SOLN
10000.0000 [IU] | Freq: Once | INTRAMUSCULAR | Status: AC
  Administered 2023-07-21: 10000 [IU] via SUBCUTANEOUS
  Filled 2023-07-21: qty 1

## 2023-07-21 NOTE — Progress Notes (Signed)
Patient presents today for Retacrit per providers order.  Hgb noted to be 10.4.  Patient has no new complaints at this time.

## 2023-07-23 LAB — TACROLIMUS LEVEL: Tacrolimus (FK506) - LabCorp: 6 ng/mL (ref 2.0–20.0)

## 2023-07-29 ENCOUNTER — Encounter: Payer: Self-pay | Admitting: Hematology

## 2023-08-05 ENCOUNTER — Other Ambulatory Visit (HOSPITAL_COMMUNITY)
Admission: RE | Admit: 2023-08-05 | Discharge: 2023-08-05 | Disposition: A | Discharge status: Home / Self Care | Payer: BC Managed Care – PPO | Source: Ambulatory Visit

## 2023-08-05 DIAGNOSIS — Z944 Liver transplant status: Secondary | ICD-10-CM | POA: Insufficient documentation

## 2023-08-05 DIAGNOSIS — D849 Immunodeficiency, unspecified: Secondary | ICD-10-CM | POA: Insufficient documentation

## 2023-08-05 LAB — COMPREHENSIVE METABOLIC PANEL
ALT: 18 U/L (ref 0–44)
AST: 16 U/L (ref 15–41)
Albumin: 2.6 g/dL — ABNORMAL LOW (ref 3.5–5.0)
Alkaline Phosphatase: 72 U/L (ref 38–126)
Anion gap: 8 (ref 5–15)
BUN: 30 mg/dL — ABNORMAL HIGH (ref 8–23)
CO2: 20 mmol/L — ABNORMAL LOW (ref 22–32)
Calcium: 7.6 mg/dL — ABNORMAL LOW (ref 8.9–10.3)
Chloride: 109 mmol/L (ref 98–111)
Creatinine, Ser: 1.74 mg/dL — ABNORMAL HIGH (ref 0.61–1.24)
GFR, Estimated: 44 mL/min — ABNORMAL LOW (ref >60)
Glucose, Bld: 98 mg/dL (ref 70–99)
Potassium: 4.9 mmol/L (ref 3.5–5.1)
Sodium: 137 mmol/L (ref 135–145)
Total Bilirubin: 0.4 mg/dL (ref 0.3–1.2)
Total Protein: 4.7 g/dL — ABNORMAL LOW (ref 6.5–8.1)

## 2023-08-05 LAB — CBC WITH DIFFERENTIAL/PLATELET
Abs Immature Granulocytes: 0 10*3/uL (ref 0.00–0.07)
Basophils Absolute: 0 10*3/uL (ref 0.0–0.1)
Basophils Relative: 1 %
Eosinophils Absolute: 0 10*3/uL (ref 0.0–0.5)
Eosinophils Relative: 0 %
HCT: 33 % — ABNORMAL LOW (ref 39.0–52.0)
Hemoglobin: 10.6 g/dL — ABNORMAL LOW (ref 13.0–17.0)
Lymphocytes Relative: 63 %
Lymphs Abs: 0.5 10*3/uL — ABNORMAL LOW (ref 0.7–4.0)
MCH: 33.8 pg (ref 26.0–34.0)
MCHC: 32.1 g/dL (ref 30.0–36.0)
MCV: 105.1 fL — ABNORMAL HIGH (ref 80.0–100.0)
Monocytes Absolute: 0.1 10*3/uL (ref 0.1–1.0)
Monocytes Relative: 13 %
Neutro Abs: 0.2 10*3/uL — CL (ref 1.7–7.7)
Neutrophils Relative %: 23 %
Platelets: 104 10*3/uL — ABNORMAL LOW (ref 150–400)
RBC: 3.14 MIL/uL — ABNORMAL LOW (ref 4.22–5.81)
RDW: 13.5 % (ref 11.5–15.5)
WBC: 0.8 10*3/uL — CL (ref 4.0–10.5)
nRBC: 0 % (ref 0.0–0.2)

## 2023-08-05 LAB — MAGNESIUM: Magnesium: 1.8 mg/dL (ref 1.7–2.4)

## 2023-08-08 LAB — TACROLIMUS LEVEL: Tacrolimus (FK506) - LabCorp: 6 ng/mL (ref 2.0–20.0)

## 2023-08-13 ENCOUNTER — Other Ambulatory Visit (HOSPITAL_COMMUNITY)
Admission: RE | Admit: 2023-08-13 | Discharge: 2023-08-13 | Disposition: A | Discharge status: Home / Self Care | Payer: BC Managed Care – PPO | Source: Ambulatory Visit

## 2023-08-13 DIAGNOSIS — Z944 Liver transplant status: Secondary | ICD-10-CM | POA: Insufficient documentation

## 2023-08-13 DIAGNOSIS — D500 Iron deficiency anemia secondary to blood loss (chronic): Secondary | ICD-10-CM | POA: Diagnosis not present

## 2023-08-13 LAB — COMPREHENSIVE METABOLIC PANEL
ALT: 27 U/L (ref 0–44)
AST: 26 U/L (ref 15–41)
Albumin: 2.5 g/dL — ABNORMAL LOW (ref 3.5–5.0)
Alkaline Phosphatase: 95 U/L (ref 38–126)
Anion gap: 3 — ABNORMAL LOW (ref 5–15)
BUN: 36 mg/dL — ABNORMAL HIGH (ref 8–23)
CO2: 24 mmol/L (ref 22–32)
Calcium: 7.7 mg/dL — ABNORMAL LOW (ref 8.9–10.3)
Chloride: 112 mmol/L — ABNORMAL HIGH (ref 98–111)
Creatinine, Ser: 1.89 mg/dL — ABNORMAL HIGH (ref 0.61–1.24)
GFR, Estimated: 39 mL/min — ABNORMAL LOW (ref >60)
Glucose, Bld: 93 mg/dL (ref 70–99)
Potassium: 5.1 mmol/L (ref 3.5–5.1)
Sodium: 139 mmol/L (ref 135–145)
Total Bilirubin: 0.5 mg/dL (ref 0.3–1.2)
Total Protein: 4.4 g/dL — ABNORMAL LOW (ref 6.5–8.1)

## 2023-08-13 LAB — CBC WITH DIFFERENTIAL/PLATELET
Abs Immature Granulocytes: 0.28 10*3/uL — ABNORMAL HIGH (ref 0.00–0.07)
Basophils Absolute: 0 10*3/uL (ref 0.0–0.1)
Basophils Relative: 1 %
Eosinophils Absolute: 0.2 10*3/uL (ref 0.0–0.5)
Eosinophils Relative: 3 %
HCT: 37.7 % — ABNORMAL LOW (ref 39.0–52.0)
Hemoglobin: 11.4 g/dL — ABNORMAL LOW (ref 13.0–17.0)
Immature Granulocytes: 6 %
Lymphocytes Relative: 21 %
Lymphs Abs: 0.9 10*3/uL (ref 0.7–4.0)
MCH: 31.9 pg (ref 26.0–34.0)
MCHC: 30.2 g/dL (ref 30.0–36.0)
MCV: 105.6 fL — ABNORMAL HIGH (ref 80.0–100.0)
Monocytes Absolute: 0.8 10*3/uL (ref 0.1–1.0)
Monocytes Relative: 18 %
Neutro Abs: 2.2 10*3/uL (ref 1.7–7.7)
Neutrophils Relative %: 51 %
Platelets: 128 10*3/uL — ABNORMAL LOW (ref 150–400)
RBC: 3.57 MIL/uL — ABNORMAL LOW (ref 4.22–5.81)
RDW: 13.2 % (ref 11.5–15.5)
WBC: 4.4 10*3/uL (ref 4.0–10.5)
nRBC: 0 % (ref 0.0–0.2)

## 2023-08-13 LAB — MAGNESIUM: Magnesium: 1.8 mg/dL (ref 1.7–2.4)

## 2023-08-15 LAB — TACROLIMUS LEVEL: Tacrolimus (FK506) - LabCorp: 8.9 ng/mL (ref 2.0–20.0)

## 2023-08-17 ENCOUNTER — Other Ambulatory Visit: Payer: Self-pay | Admitting: *Deleted

## 2023-08-17 DIAGNOSIS — Z944 Liver transplant status: Secondary | ICD-10-CM

## 2023-08-17 DIAGNOSIS — D500 Iron deficiency anemia secondary to blood loss (chronic): Secondary | ICD-10-CM

## 2023-08-18 ENCOUNTER — Other Ambulatory Visit: Payer: Self-pay

## 2023-08-18 ENCOUNTER — Observation Stay (HOSPITAL_COMMUNITY)
Admission: EM | Admit: 2023-08-18 | Discharge: 2023-08-20 | Disposition: A | Discharge status: Home / Self Care | Payer: BC Managed Care – PPO | Attending: Internal Medicine | Admitting: Internal Medicine

## 2023-08-18 ENCOUNTER — Inpatient Hospital Stay: Payer: BC Managed Care – PPO

## 2023-08-18 ENCOUNTER — Inpatient Hospital Stay: Payer: BC Managed Care – PPO | Attending: Physician Assistant

## 2023-08-18 ENCOUNTER — Telehealth: Payer: Self-pay | Admitting: *Deleted

## 2023-08-18 ENCOUNTER — Encounter (HOSPITAL_COMMUNITY): Payer: Self-pay | Admitting: Emergency Medicine

## 2023-08-18 DIAGNOSIS — D696 Thrombocytopenia, unspecified: Secondary | ICD-10-CM | POA: Diagnosis not present

## 2023-08-18 DIAGNOSIS — Z7982 Long term (current) use of aspirin: Secondary | ICD-10-CM | POA: Diagnosis not present

## 2023-08-18 DIAGNOSIS — Z8546 Personal history of malignant neoplasm of prostate: Secondary | ICD-10-CM | POA: Diagnosis not present

## 2023-08-18 DIAGNOSIS — E875 Hyperkalemia: Secondary | ICD-10-CM | POA: Diagnosis not present

## 2023-08-18 DIAGNOSIS — R7989 Other specified abnormal findings of blood chemistry: Secondary | ICD-10-CM | POA: Diagnosis present

## 2023-08-18 DIAGNOSIS — N1832 Chronic kidney disease, stage 3b: Secondary | ICD-10-CM | POA: Insufficient documentation

## 2023-08-18 DIAGNOSIS — Z944 Liver transplant status: Secondary | ICD-10-CM | POA: Diagnosis not present

## 2023-08-18 DIAGNOSIS — E1122 Type 2 diabetes mellitus with diabetic chronic kidney disease: Secondary | ICD-10-CM | POA: Insufficient documentation

## 2023-08-18 DIAGNOSIS — Z79899 Other long term (current) drug therapy: Secondary | ICD-10-CM | POA: Insufficient documentation

## 2023-08-18 DIAGNOSIS — J45909 Unspecified asthma, uncomplicated: Secondary | ICD-10-CM | POA: Diagnosis not present

## 2023-08-18 DIAGNOSIS — N184 Chronic kidney disease, stage 4 (severe): Secondary | ICD-10-CM | POA: Insufficient documentation

## 2023-08-18 DIAGNOSIS — D500 Iron deficiency anemia secondary to blood loss (chronic): Secondary | ICD-10-CM

## 2023-08-18 LAB — BASIC METABOLIC PANEL
Anion gap: 5 (ref 5–15)
BUN: 42 mg/dL — ABNORMAL HIGH (ref 8–23)
CO2: 22 mmol/L (ref 22–32)
Calcium: 7.9 mg/dL — ABNORMAL LOW (ref 8.9–10.3)
Chloride: 111 mmol/L (ref 98–111)
Creatinine, Ser: 2.11 mg/dL — ABNORMAL HIGH (ref 0.61–1.24)
GFR, Estimated: 35 mL/min — ABNORMAL LOW (ref >60)
Glucose, Bld: 78 mg/dL (ref 70–99)
Potassium: 6.8 mmol/L (ref 3.5–5.1)
Sodium: 138 mmol/L (ref 135–145)

## 2023-08-18 LAB — CBC WITH DIFFERENTIAL/PLATELET
Abs Immature Granulocytes: 0 10*3/uL (ref 0.00–0.07)
Band Neutrophils: 1 %
Basophils Absolute: 0 10*3/uL (ref 0.0–0.1)
Basophils Relative: 0 %
Eosinophils Absolute: 0.2 10*3/uL (ref 0.0–0.5)
Eosinophils Relative: 8 %
HCT: 36.5 % — ABNORMAL LOW (ref 39.0–52.0)
Hemoglobin: 11.4 g/dL — ABNORMAL LOW (ref 13.0–17.0)
Lymphocytes Relative: 28 %
Lymphs Abs: 0.8 10*3/uL (ref 0.7–4.0)
MCH: 32.9 pg (ref 26.0–34.0)
MCHC: 31.2 g/dL (ref 30.0–36.0)
MCV: 105.5 fL — ABNORMAL HIGH (ref 80.0–100.0)
Monocytes Absolute: 0.2 10*3/uL (ref 0.1–1.0)
Monocytes Relative: 7 %
Neutro Abs: 1.7 10*3/uL (ref 1.7–7.7)
Neutrophils Relative %: 56 %
Platelets: 145 10*3/uL — ABNORMAL LOW (ref 150–400)
RBC: 3.46 MIL/uL — ABNORMAL LOW (ref 4.22–5.81)
RDW: 13.3 % (ref 11.5–15.5)
WBC: 3 10*3/uL — ABNORMAL LOW (ref 4.0–10.5)
nRBC: 0 % (ref 0.0–0.2)

## 2023-08-18 LAB — COMPREHENSIVE METABOLIC PANEL
ALT: 20 U/L (ref 0–44)
AST: 21 U/L (ref 15–41)
Albumin: 2.6 g/dL — ABNORMAL LOW (ref 3.5–5.0)
Alkaline Phosphatase: 105 U/L (ref 38–126)
Anion gap: 4 — ABNORMAL LOW (ref 5–15)
BUN: 42 mg/dL — ABNORMAL HIGH (ref 8–23)
CO2: 22 mmol/L (ref 22–32)
Calcium: 8 mg/dL — ABNORMAL LOW (ref 8.9–10.3)
Chloride: 113 mmol/L — ABNORMAL HIGH (ref 98–111)
Creatinine, Ser: 2.1 mg/dL — ABNORMAL HIGH (ref 0.61–1.24)
GFR, Estimated: 35 mL/min — ABNORMAL LOW (ref >60)
Glucose, Bld: 87 mg/dL (ref 70–99)
Potassium: 6.7 mmol/L (ref 3.5–5.1)
Sodium: 139 mmol/L (ref 135–145)
Total Bilirubin: 0.5 mg/dL (ref 0.3–1.2)
Total Protein: 4.8 g/dL — ABNORMAL LOW (ref 6.5–8.1)

## 2023-08-18 LAB — CBG MONITORING, ED
Glucose-Capillary: 13 mg/dL — CL (ref 70–99)
Glucose-Capillary: 154 mg/dL — ABNORMAL HIGH (ref 70–99)

## 2023-08-18 LAB — GLUCOSE, CAPILLARY: Glucose-Capillary: 145 mg/dL — ABNORMAL HIGH (ref 70–99)

## 2023-08-18 LAB — POTASSIUM: Potassium: 6 mmol/L — ABNORMAL HIGH (ref 3.5–5.1)

## 2023-08-18 LAB — MAGNESIUM: Magnesium: 2 mg/dL (ref 1.7–2.4)

## 2023-08-18 MED ORDER — SODIUM CHLORIDE 0.9 % IV SOLN: INTRAVENOUS | Status: AC

## 2023-08-18 MED ORDER — DEXTROSE 50 % IV SOLN: 50.0000 mL | Freq: Once | INTRAVENOUS | Status: AC

## 2023-08-18 MED ORDER — ONDANSETRON HCL 4 MG/2ML IJ SOLN: 4.0000 mg | Freq: Four times a day (QID) | INTRAMUSCULAR | Status: DC | PRN

## 2023-08-18 MED ORDER — TAMSULOSIN HCL 0.4 MG PO CAPS
0.4000 mg | ORAL_CAPSULE | Freq: Every day | ORAL | Status: DC
  Administered 2023-08-19 – 2023-08-20 (×2): 0.4 mg via ORAL
  Filled 2023-08-18 (×2): qty 1

## 2023-08-18 MED ORDER — CARVEDILOL 3.125 MG PO TABS
6.2500 mg | ORAL_TABLET | Freq: Two times a day (BID) | ORAL | Status: DC
  Administered 2023-08-18 – 2023-08-20 (×4): 6.25 mg via ORAL
  Filled 2023-08-18 (×4): qty 2

## 2023-08-18 MED ORDER — PANTOPRAZOLE SODIUM 40 MG PO TBEC
40.0000 mg | DELAYED_RELEASE_TABLET | Freq: Two times a day (BID) | ORAL | Status: DC
  Administered 2023-08-18 – 2023-08-20 (×3): 40 mg via ORAL
  Filled 2023-08-18 (×4): qty 1

## 2023-08-18 MED ORDER — ONDANSETRON HCL 4 MG PO TABS: 4.0000 mg | ORAL_TABLET | Freq: Four times a day (QID) | ORAL | Status: DC | PRN

## 2023-08-18 MED ORDER — TACROLIMUS 0.5 MG PO CAPS
2.0000 mg | ORAL_CAPSULE | Freq: Every day | ORAL | Status: DC
  Administered 2023-08-19 – 2023-08-20 (×2): 2 mg via ORAL
  Filled 2023-08-18: qty 4

## 2023-08-18 MED ORDER — ASPIRIN 81 MG PO TBEC
81.0000 mg | DELAYED_RELEASE_TABLET | Freq: Every day | ORAL | Status: DC
  Administered 2023-08-19 – 2023-08-20 (×2): 81 mg via ORAL
  Filled 2023-08-18 (×2): qty 1

## 2023-08-18 MED ORDER — CALCIUM GLUCONATE-NACL 1-0.675 GM/50ML-% IV SOLN
1.0000 g | Freq: Once | INTRAVENOUS | Status: AC
  Administered 2023-08-18: 1000 mg via INTRAVENOUS
  Filled 2023-08-18: qty 50

## 2023-08-18 MED ORDER — TACROLIMUS 0.5 MG PO CAPS
3.0000 mg | ORAL_CAPSULE | Freq: Every day | ORAL | Status: DC
  Administered 2023-08-18 – 2023-08-19 (×2): 3 mg via ORAL
  Filled 2023-08-18 (×2): qty 6

## 2023-08-18 MED ORDER — SODIUM ZIRCONIUM CYCLOSILICATE 5 G PO PACK
10.0000 g | PACK | Freq: Once | ORAL | Status: AC
  Administered 2023-08-18: 10 g via ORAL
  Filled 2023-08-18: qty 2

## 2023-08-18 MED ORDER — DEXTROSE 50 % IV SOLN
1.0000 | Freq: Once | INTRAVENOUS | Status: AC
  Administered 2023-08-18: 50 mL via INTRAVENOUS
  Filled 2023-08-18: qty 50

## 2023-08-18 MED ORDER — INSULIN ASPART 100 UNIT/ML IV SOLN
10.0000 [IU] | Freq: Once | INTRAVENOUS | Status: AC
  Administered 2023-08-18: 10 [IU] via INTRAVENOUS

## 2023-08-18 MED ORDER — CALCIUM GLUCONATE 10 % IV SOLN
1.0000 g | Freq: Once | INTRAVENOUS | Status: AC
  Administered 2023-08-18: 1 g via INTRAVENOUS
  Filled 2023-08-18: qty 10

## 2023-08-18 MED ORDER — DEXTROSE 50 % IV SOLN
INTRAVENOUS | Status: AC
  Administered 2023-08-18: 50 mL via INTRAVENOUS
  Filled 2023-08-18: qty 50

## 2023-08-18 MED ORDER — MYCOPHENOLATE SODIUM 180 MG PO TBEC
180.0000 mg | DELAYED_RELEASE_TABLET | Freq: Two times a day (BID) | ORAL | Status: DC
  Administered 2023-08-18 – 2023-08-20 (×4): 180 mg via ORAL
  Filled 2023-08-18 (×10): qty 1

## 2023-08-18 MED ORDER — SODIUM ZIRCONIUM CYCLOSILICATE 10 G PO PACK
10.0000 g | PACK | Freq: Once | ORAL | Status: AC
  Administered 2023-08-18: 10 g via ORAL
  Filled 2023-08-18: qty 1

## 2023-08-18 MED ORDER — SODIUM BICARBONATE 8.4 % IV SOLN
50.0000 meq | Freq: Once | INTRAVENOUS | Status: AC
  Administered 2023-08-18: 50 meq via INTRAVENOUS
  Filled 2023-08-18: qty 50

## 2023-08-18 MED ORDER — ENOXAPARIN SODIUM 40 MG/0.4ML IJ SOSY
40.0000 mg | PREFILLED_SYRINGE | INTRAMUSCULAR | Status: DC
  Filled 2023-08-18 (×2): qty 0.4

## 2023-08-18 MED ORDER — VITAMIN B-12 100 MCG PO TABS
500.0000 ug | ORAL_TABLET | Freq: Every day | ORAL | Status: DC
  Administered 2023-08-19 – 2023-08-20 (×2): 500 ug via ORAL
  Filled 2023-08-18 (×2): qty 5

## 2023-08-18 NOTE — ED Notes (Signed)
ED TO INPATIENT HANDOFF REPORT  ED Nurse Name and Phone #:  Neldon Mc RN [phone]  S Name/Age/Gender Michael Harmon 62 y.o. male Room/Bed: APA04/APA04  Code Status   Code Status: Full Code  Home/SNF/Other Home Patient oriented to: self, place, time, and situation Is this baseline? Yes   Triage Complete: Triage complete  Chief Complaint Hyperkalemia [E87.5]  Triage Note Pt had routine blood work this morning and was called to come to the ED to have his potassium rechecked K 6.8 pt has no complaints    Allergies Allergies  Allergen Reactions   Penicillins Other (See Comments)    As a baby, unknown reaction Did it involve swelling of the face/tongue/throat, SOB, or low BP? Unknown Did it involve sudden or severe rash/hives, skin peeling, or any reaction on the inside of your mouth or nose? Unknown Did you need to seek medical attention at a hospital or doctor's office? Unknown When did it last happen?      infant allergy If all above answers are "NO", may proceed with cephalosporin use. .    Gluten Meal Diarrhea   Biaxin [Clarithromycin] Tinitus    Ears ringing, loss sense of smell    Level of Care/Admitting Diagnosis ED Disposition     ED Disposition  Admit   Condition  --   Comment  Hospital Area: [facility] [[id number]]  Level of Care: Telemetry [5]  Covid Evaluation: Asymptomatic - no recent exposure (last 10 days) testing not required  Diagnosis: Hyperkalemia [846962]  Admitting Physician: TAT, DAVID [[id number]]  Attending Physician: TAT, DAVID [[id number]]          B Medical/Surgery History Past Medical History:  Diagnosis Date   Allergy    nose and sinus problems   Anemia    Anemia in chronic kidney disease (CKD) 11/23/2022   Asthma    Celiac disease    Cirrhosis, cryptogenic (HCC)    completed Hep A and B vaccines   DM (diabetes mellitus) (HCC)    Encounter for general adult medical examination with abnormal findings 09/08/2022    GERD (gastroesophageal reflux disease)    Hyperlipidemia    diet controlled now with 100 pound weight loss   Hypertension    Iron deficiency anemia due to chronic blood loss 08/13/2021   Malignant neoplasm of prostate (HCC) 10/03/2021   Murmur, heart 03/02/2022   Narrow angle glaucoma suspect of both eyes    Renal cyst 09/03/2017   right   Situational depression 07/06/2017   Splenomegaly    Thrombocytopenia (HCC)    hematology, ?related to chol med (tricor) and glimeripide, just being monitored, above 100,000   Past Surgical History:  Procedure Laterality Date   BIOPSY  07/09/2016   Procedure: BIOPSY;  Surgeon: Corbin Ade, MD;  [facility];  Service: Endoscopy;;  duodenal, gastric, esophaageal   BIOPSY  07/31/2021   Procedure: BIOPSY;  Surgeon: Corbin Ade, MD;  [facility];  Service: Endoscopy;;   CLOSED MANIPULATION SHOULDER     COLONOSCOPY  11/2015   Dr. Allena Katz: cecal bx neg for microscopic colitis. ascending colon polyp (adenomatous)   COLONOSCOPY WITH PROPOFOL N/A 07/31/2021   Procedure: COLONOSCOPY WITH PROPOFOL;  Surgeon: Corbin Ade, MD;  [facility];  Service: Endoscopy;  Laterality: N/A;  7:30am   ESOPHAGEAL BANDING N/A 08/04/2017   Procedure: ESOPHAGEAL BANDING;  Surgeon: Corbin Ade, MD;  [facility];  Service: Endoscopy;  Laterality: N/A;  esophageal  varices banding   ESOPHAGEAL BANDING N/A 10/13/2017   Procedure: ESOPHAGEAL BANDING;  Surgeon: Corbin Ade, MD;  [facility];  Service: Endoscopy;  Laterality: N/A;   ESOPHAGEAL BANDING N/A 04/09/2020   Procedure: ESOPHAGEAL BANDING;  Surgeon: Corbin Ade, MD;  [facility];  Service: Endoscopy;  Laterality: N/A;   ESOPHAGOGASTRODUODENOSCOPY N/A 07/09/2016   Procedure: ESOPHAGOGASTRODUODENOSCOPY (EGD);  Surgeon: Corbin Ade, MD;  [facility];  Service: Endoscopy;  Laterality: N/A;  730   ESOPHAGOGASTRODUODENOSCOPY N/A  08/04/2017   Procedure: ESOPHAGOGASTRODUODENOSCOPY (EGD);  Surgeon: Corbin Ade, MD;  [facility];  Service: Endoscopy;  Laterality: N/A;  7:30am   ESOPHAGOGASTRODUODENOSCOPY N/A 10/13/2017   Dr. Jena Gauss: grade 2 esophageal varices status post banding, portal hypertensive gastropathy   ESOPHAGOGASTRODUODENOSCOPY  11/23/2019   [name]: Esophageal varices, 4 columns of grade 2-3 status post banding.  Varices more prominent than seen in December 2018.  Portal gastropathy.  Normal-appearing small bowel.   ESOPHAGOGASTRODUODENOSCOPY (EGD) WITH PROPOFOL N/A 04/09/2020   Dr. Jena Gauss: 4 columns of grade 2/grade 3 esophageal varices somewhat recalcitrant.  Status post esophageal band ligation today.  Portal gastropathy.  Normal-appearing small bowel.   ESOPHAGOGASTRODUODENOSCOPY (EGD) WITH PROPOFOL N/A 07/31/2021   Procedure: ESOPHAGOGASTRODUODENOSCOPY (EGD) WITH PROPOFOL;  Surgeon: Corbin Ade, MD;  [facility];  Service: Endoscopy;  Laterality: N/A;   GOLD SEED IMPLANT N/A 01/15/2022   Procedure: GOLD SEED IMPLANT;  Surgeon: Malen Gauze, MD;  [facility]: [facility];  Service: Urology;  Laterality: N/A;   LIVER TRANSPLANT  01/16/2023   SPACE OAR INSTILLATION N/A 01/15/2022   Procedure: SPACE OAR INSTILLATION;  Surgeon: Malen Gauze, MD;  Location: [facility];  Service: Urology;  Laterality: N/A;   US PARACENTESIS  06/10/2022     A IV Location/Drains/Wounds Patient Lines/Drains/Airways Status     Active Line/Drains/Airways     Name Placement date Placement time Site Days   Peripheral IV 08/18/23 20 G 1" Anterior;Left Forearm 08/18/23  1244  Forearm  less than 1            Intake/Output Last 24 hours No intake or output data in the 24 hours ending 08/18/23 1603  Labs/Imaging Results for orders placed or performed during the hospital encounter of 08/18/23 (from the past 48 hour(s))  Basic metabolic panel     Status: Abnormal   Collection Time: 08/18/23 11:31 AM   Result Value Ref Range   Sodium 138 135 - 145 mmol/L   Potassium 6.8 (HH) 3.5 - 5.1 mmol/L    Comment: CRITICAL RESULT CALLED TO, READ BACK BY AND VERIFIED WITH  FOWLER,D AT 11:55AM ON 08/18/23 BY FESTERMAN,C   Chloride 111 98 - 111 mmol/L   CO2 22 22 - 32 mmol/L   Glucose, Bld 78 70 - 99 mg/dL    Comment: Glucose reference range applies only to samples taken after fasting for at least 8 hours.   BUN 42 (H) 8 - 23 mg/dL   Creatinine, Ser 1.61 (H) 0.61 - 1.24 mg/dL   Calcium 7.9 (L) 8.9 - 10.3 mg/dL   GFR, Estimated 35 (L) >60 mL/min    Comment: (NOTE) Calculated using the CKD-EPI Creatinine Equation (2021)    Anion gap 5 5 - 15    Comment: Performed at [facility], [address], [place], [place] [address]  CBG monitoring, ED     Status: Abnormal   Collection Time: 08/18/23  2:07 PM  Result Value Ref  Range   Glucose-Capillary 13 (LL) 70 - 99 mg/dL    Comment: Glucose reference range applies only to samples taken after fasting for at least 8 hours.   Comment 1 Notify RN   CBG monitoring, ED     Status: Abnormal   Collection Time: 08/18/23  2:16 PM  Result Value Ref Range   Glucose-Capillary 154 (H) 70 - 99 mg/dL    Comment: Glucose reference range applies only to samples taken after fasting for at least 8 hours.   No results found.  Pending Labs [employer] (From admission, onward)     Start     Ordered   Signed and Held  HIV Antibody (routine testing w rflx)  (HIV Antibody (Routine testing w reflex) panel)  Once,   R        Signed and Held   Signed and Held  Basic metabolic panel  Tomorrow morning,   R        Signed and Held   Signed and Held  Potassium  Once,   R        Signed and Held            Vitals/Pain Today's Vitals   08/18/23 1300 08/18/23 1445 08/18/23 1500 08/18/23 1600  BP: (!) 147/66 126/68 112/61 (!) 121/54  Pulse: 69 (!) 56 (!) 56 (!) 54  Resp: 11 17 18 16   Temp:      TempSrc:      SpO2: 96% 98% 97% 98%    Isolation Precautions No  active isolations  Medications Medications  calcium gluconate inj 10% (1 g) URGENT USE ONLY! (1 g Intravenous Given 08/18/23 1250)  insulin aspart (novoLOG) injection 10 Units (10 Units Intravenous Given 08/18/23 1250)    And  dextrose 50 % solution 50 mL (50 mLs Intravenous Given 08/18/23 1249)  sodium bicarbonate injection 50 mEq (50 mEq Intravenous Given 08/18/23 1249)  sodium zirconium cyclosilicate (LOKELMA) packet 10 g (10 g Oral Given 08/18/23 1302)  dextrose 50 % solution 50 mL (50 mLs Intravenous Given 08/18/23 1413)    Mobility walks     Focused Assessments    R Recommendations: See Admitting Provider Note  Report given to:  Tori LPN  Additional Notes:

## 2023-08-18 NOTE — H&P (Signed)
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  Pt had routine BMP done by his transplant team which revealed potassium of 6.7.  He was contacted and told to go to the ED.  The patient has had a hx of chronic hyperkalemia, usually in the 5.2-5.6 range.  He was previously on lasix for peripheral edema.  Pt states that in the past week he was started on spironolactone due to recurrently LE edema and hence the reason for the the routine BMP draw.  He denies any other new medicines or new OTC supplements.  Patient denies fevers, chills, headache, chest pain, dyspnea, nausea, vomiting, diarrhea, abdominal pain, dysuria, hematuria, hematochezia, and melena. In the ED, the patient was afebrile and hemodynamically stable with oxygen saturation 96% on RA.  He was given calcium gluconate, D50+insulin, bicarbonate and a dose of Lokelma.  He was admitted to telemetry for further monitoring.    Review of Systems: As mentioned in the history of present illness. All other systems reviewed and are negative. Past Medical History:  Diagnosis Date   Allergy    nose and sinus problems   Anemia    Anemia in chronic kidney disease (CKD) 11/23/2022   Asthma    Celiac disease    Cirrhosis, cryptogenic (HCC)    completed Hep A and B vaccines   DM (diabetes mellitus) (HCC)    Encounter for general adult medical examination with abnormal findings 09/08/2022   GERD (gastroesophageal reflux disease)    Hyperlipidemia    diet controlled now with 100 pound weight loss   Hypertension    Iron deficiency  anemia due to chronic blood loss 08/13/2021   Malignant neoplasm of prostate (HCC) 10/03/2021   Murmur, heart 03/02/2022   Narrow angle glaucoma suspect of both eyes    Renal cyst 09/03/2017   right   Situational depression 07/06/2017   Splenomegaly    Thrombocytopenia (HCC)    hematology, ?related to chol med (tricor) and glimeripide, just being monitored, above 100,000   Past Surgical History:  Procedure Laterality Date   BIOPSY  07/09/2016   Procedure: BIOPSY;  Surgeon: Corbin Ade, MD;  [facility];  Service: Endoscopy;;  duodenal, gastric, esophaageal   BIOPSY  07/31/2021   Procedure: BIOPSY;  Surgeon: Corbin Ade, MD;  [facility];  Service: Endoscopy;;   CLOSED MANIPULATION SHOULDER     COLONOSCOPY  11/2015   Dr. Allena Katz: cecal bx neg for microscopic colitis. ascending colon polyp (adenomatous)   COLONOSCOPY WITH PROPOFOL N/A 07/31/2021   Procedure: COLONOSCOPY WITH PROPOFOL;  Surgeon: Corbin Ade, MD;  [facility];  Service: Endoscopy;  Laterality: N/A;  7:30am   ESOPHAGEAL BANDING N/A 08/04/2017   Procedure: ESOPHAGEAL BANDING;  Surgeon: Corbin Ade, MD;  [facility];  Service: Endoscopy;  Laterality: N/A;  esophageal varices banding   ESOPHAGEAL BANDING N/A 10/13/2017   Procedure: ESOPHAGEAL BANDING;  Surgeon: Corbin Ade, MD;  [facility];  Service: Endoscopy;  Laterality: N/A;   ESOPHAGEAL BANDING N/A  04/09/2020   Procedure: ESOPHAGEAL BANDING;  Surgeon: Corbin Ade, MD;  [facility];  Service: Endoscopy;  Laterality: N/A;   ESOPHAGOGASTRODUODENOSCOPY N/A 07/09/2016   Procedure: ESOPHAGOGASTRODUODENOSCOPY (EGD);  Surgeon: Corbin Ade, MD;  [facility];  Service: Endoscopy;  Laterality: N/A;  730   ESOPHAGOGASTRODUODENOSCOPY N/A 08/04/2017   Procedure: ESOPHAGOGASTRODUODENOSCOPY (EGD);  Surgeon: Corbin Ade, MD;  [facility];  Service: Endoscopy;  Laterality: N/A;   7:30am   ESOPHAGOGASTRODUODENOSCOPY N/A 10/13/2017   Dr. Jena Gauss: grade 2 esophageal varices status post banding, portal hypertensive gastropathy   ESOPHAGOGASTRODUODENOSCOPY  11/23/2019   [name]: Esophageal varices, 4 columns of grade 2-3 status post banding.  Varices more prominent than seen in December 2018.  Portal gastropathy.  Normal-appearing small bowel.   ESOPHAGOGASTRODUODENOSCOPY (EGD) WITH PROPOFOL N/A 04/09/2020   Dr. Jena Gauss: 4 columns of grade 2/grade 3 esophageal varices somewhat recalcitrant.  Status post esophageal band ligation today.  Portal gastropathy.  Normal-appearing small bowel.   ESOPHAGOGASTRODUODENOSCOPY (EGD) WITH PROPOFOL N/A 07/31/2021   Procedure: ESOPHAGOGASTRODUODENOSCOPY (EGD) WITH PROPOFOL;  Surgeon: Corbin Ade, MD;  [facility];  Service: Endoscopy;  Laterality: N/A;   GOLD SEED IMPLANT N/A 01/15/2022   Procedure: GOLD SEED IMPLANT;  Surgeon: Malen Gauze, MD;  [facility]: [facility];  Service: Urology;  Laterality: N/A;   LIVER TRANSPLANT  01/16/2023   SPACE OAR INSTILLATION N/A 01/15/2022   Procedure: SPACE OAR INSTILLATION;  Surgeon: Malen Gauze, MD;  Location: [facility];  Service: Urology;  Laterality: N/A;   US PARACENTESIS  06/10/2022   Social History:  reports that he has never smoked. He has never used smokeless tobacco. He reports that he does not currently use alcohol. He reports that he does not use drugs.  Allergies  Allergen Reactions   Penicillins Other (See Comments)    As a baby, unknown reaction Did it involve swelling of the face/tongue/throat, SOB, or low BP? Unknown Did it involve sudden or severe rash/hives, skin peeling, or any reaction on the inside of your mouth or nose? Unknown Did you need to seek medical attention at a hospital or doctor's office? Unknown When did it last happen?      infant allergy If all above answers are "NO", may proceed with cephalosporin use. .    Gluten Meal Diarrhea   Biaxin  [Clarithromycin] Tinitus    Ears ringing, loss sense of smell    Family History  Problem Relation Age of Onset   Other Mother        chronic diarrhea   COPD Mother    Heart disease Mother        died at 11   Arthritis Mother    Cancer Mother    Depression Mother    Diabetes Mother    Hyperlipidemia Mother    Hypertension Mother    Stroke Mother    Arthritis Father    Asthma Father    Birth defects Father    Heart disease Father        aortic valve replaced   Heart disease Maternal Grandmother    Stroke Maternal Grandmother    Heart disease Maternal Grandfather    Stroke Maternal Grandfather    Diabetes Paternal Grandmother    Stroke Paternal Grandfather    Heart disease Paternal Grandfather    Liver disease Neg Hx    Colon cancer Neg Hx     Prior to Admission medications   Medication Sig Start Date  End Date Taking? Authorizing Provider  amLODipine (NORVASC) 5 MG tablet Take by mouth. 04/09/23 04/08/24  [provider]  aspirin EC 81 MG tablet Take by mouth. 01/18/23   [provider]  BD PEN NEEDLE NANO 2ND GEN 32G X 4 MM MISC  08/06/21   [provider]  Continuous Blood Gluc Receiver (FREESTYLE LIBRE 2 READER) DEVI USE AS DIRECTED TO TEST GLUCOSE LEVELS AT LEAST 4 TIMES DAILY E11.9 09/29/21   [provider]  Continuous Blood Gluc Sensor (FREESTYLE LIBRE 2 SENSOR) MISC USE AS DIRECTED FOR BLOOD SUGAR MONITORING. CHANGE EVERY 14 DAYS 08/10/21   [provider]  cyanocobalamin (VITAMIN B12) 500 MCG tablet Take 1 tablet (500 mcg total) by mouth daily. 03/30/23   [name], PA-C  furosemide (LASIX) 20 MG tablet Take by mouth. 06/14/23 06/13/24  [provider]  Glucagon (GVOKE HYPOPEN 2-PACK) 1 MG/0.2ML SOAJ     [provider]  hydrocortisone (ANUSOL-HC) 25 MG suppository Place 1 suppository (25 mg total) rectally 2 (two) times daily as needed for hemorrhoids or anal itching. 03/02/22   [name], MD   mycophenolate (MYFORTIC) 180 MG EC tablet Take 180 mg by mouth 2 (two) times daily.    [provider]  pantoprazole (PROTONIX) 40 MG tablet Take 40 mg by mouth 2 (two) times daily.    [provider]  PREDNISONE PO Take 7.5 mg by mouth daily. 03/08/23   [provider]  Specialty Vitamins Products (MG PLUS PROTEIN) 133 MG TABS Take by mouth. 03/08/23   [provider]  tacrolimus (PROGRAF) 1 MG capsule Take 3 mg by mouth 2 (two) times daily. Taking 3 mg in the morning and 3 mg at night 03/03/23 03/02/24  [provider]  valGANciclovir (VALCYTE) 450 MG tablet Take by mouth daily.    [provider]    Physical Exam: Vitals:   08/18/23 1107 08/18/23 1300  BP: (!) 143/79 (!) 147/66  Pulse: 69 69  Resp: 16 11  Temp: 98.3 F (36.8 C)   TempSrc: Oral   SpO2: 96% 96%   GENERAL:  A&O x 3, NAD, well developed, cooperative, follows commands HEENT: [place]/AT, No thrush, No icterus, No oral ulcers Neck:  No neck mass, No meningismus, soft, supple CV: RRR, no S3, no S4, no rub, no JVD Lungs:  CTA, no wheeze, no rhonchi, good air movement Abd: soft/NT +BS, nondistended Ext: No edema, no lymphangitis, no cyanosis, no rashes Neuro:  CN II-XII intact, strength 4/5 in RUE, RLE, strength 4/5 LUE, LLE; sensation intact bilateral; no dysmetria; babinski equivocal  Data Reviewed: Data reviewed above in history.  Assessment and Plan: Hyperkalemia -worsening due to recent spironolactone -temporizing measures given in ED -Lokelma -repeat K -judicious IVF  NASH Cirrhosis s/p liver transplant -continue tacrolimus 2 mg AM, 3 mg hs -continue mycophenolate 180 mg bid  HTN -continue coreg 6.25 mg bid  CKD 3B -baseline creatinine 1.6-1.9  RBBB -chronic  -noted on EKG 07/29/21 previously  Thrombocytopenia -chronic -monitor for signs of bleed    Advance Care Planning: FULL  Consults: none  Family Communication: none  Severity of Illness: The  appropriate patient status for this patient is OBSERVATION. Observation status is judged to be reasonable and necessary in order to provide the required intensity of service to ensure the patient's safety. The patient's presenting symptoms, physical exam findings, and initial radiographic and laboratory data in the context of their medical condition is felt to place them at  decreased risk for further clinical deterioration. Furthermore, it is anticipated that the patient will be medically stable for discharge from the hospital within 2 midnights of admission.   Author: Catarina Hartshorn, MD 08/18/2023 1:42 PM  For on call review [email or web address].

## 2023-08-18 NOTE — ED Provider Notes (Signed)
[place] EMERGENCY DEPARTMENT AT [facility] Provider Note   CSN: [id number] Arrival date & time: 08/18/23  1051     History {Add pertinent medical, surgical, social history, OB history to HPI:1} Chief Complaint  Patient presents with   Abnormal Lab    Michael Harmon is a 62 y.o. male.  Patient has a history of liver transplant in March.  He had labs done today which showed his potassium was elevated.  Patient has no complaints except for mild weakness   Weakness      Home Medications Prior to Admission medications   Medication Sig Start Date End Date Taking? Authorizing Provider  amLODipine (NORVASC) 5 MG tablet Take by mouth. 04/09/23 04/08/24  [provider]  aspirin EC 81 MG tablet Take by mouth. 01/18/23   [provider]  BD PEN NEEDLE NANO 2ND GEN 32G X 4 MM MISC  08/06/21   [provider]  Continuous Blood Gluc Receiver (FREESTYLE LIBRE 2 READER) DEVI USE AS DIRECTED TO TEST GLUCOSE LEVELS AT LEAST 4 TIMES DAILY E11.9 09/29/21   [provider]  Continuous Blood Gluc Sensor (FREESTYLE LIBRE 2 SENSOR) MISC USE AS DIRECTED FOR BLOOD SUGAR MONITORING. CHANGE EVERY 14 DAYS 08/10/21   [provider]  cyanocobalamin (VITAMIN B12) 500 MCG tablet Take 1 tablet (500 mcg total) by mouth daily. 03/30/23   [name], PA-C  furosemide (LASIX) 20 MG tablet Take by mouth. 06/14/23 06/13/24  [provider]  Glucagon (GVOKE HYPOPEN 2-PACK) 1 MG/0.2ML SOAJ     [provider]  hydrocortisone (ANUSOL-HC) 25 MG suppository Place 1 suppository (25 mg total) rectally 2 (two) times daily as needed for hemorrhoids or anal itching. 03/02/22   [name], MD  mycophenolate (MYFORTIC) 180 MG EC tablet Take 180 mg by mouth 2 (two) times daily.    [provider]  pantoprazole (PROTONIX) 40 MG tablet Take 40 mg by mouth 2 (two) times daily.    [provider]  PREDNISONE PO Take 7.5 mg by mouth  daily. 03/08/23   [provider]  Specialty Vitamins Products (MG PLUS PROTEIN) 133 MG TABS Take by mouth. 03/08/23   [provider]  tacrolimus (PROGRAF) 1 MG capsule Take 3 mg by mouth 2 (two) times daily. Taking 3 mg in the morning and 3 mg at night 03/03/23 03/02/24  [provider]  valGANciclovir (VALCYTE) 450 MG tablet Take by mouth daily.    [provider]      Allergies    Penicillins, Gluten meal, and Biaxin [clarithromycin]    Review of Systems   Review of Systems  Neurological:  Positive for weakness.    Physical Exam Updated Vital Signs BP (!) 147/66   Pulse 69   Temp 98.3 F (36.8 C) (Oral)   Resp 11   SpO2 96%  Physical Exam  ED Results / Procedures / Treatments   Labs (all labs ordered are listed, but only abnormal results are displayed) Labs Reviewed  BASIC METABOLIC PANEL - Abnormal; Notable for the following components:      Result Value   Potassium 6.8 (*)    BUN 42 (*)    Creatinine, Ser 2.11 (*)    Calcium 7.9 (*)    GFR, Estimated 35 (*)    All other components within normal limits    EKG None  Radiology No results found.  Procedures Procedures  {Document cardiac monitor, telemetry assessment procedure when appropriate:1}  Medications Ordered  in ED Medications  calcium gluconate inj 10% (1 g) URGENT USE ONLY! (1 g Intravenous Given 08/18/23 1250)  insulin aspart (novoLOG) injection 10 Units (10 Units Intravenous Given 08/18/23 1250)    And  dextrose 50 % solution 50 mL (50 mLs Intravenous Given 08/18/23 1249)  sodium bicarbonate injection 50 mEq (50 mEq Intravenous Given 08/18/23 1249)  sodium zirconium cyclosilicate (LOKELMA) packet 10 g (10 g Oral Given 08/18/23 1302)    ED Course/ Medical Decision Making/ A&P   {  CRITICAL CARE Performed by: Bethann Berkshire Total critical care time: 40 minutes Critical care time was exclusive of separately billable procedures and treating other patients. Critical  care was necessary to treat or prevent imminent or life-threatening deterioration. Critical care was time spent personally by me on the following activities: development of treatment plan with patient and/or surrogate as well as nursing, discussions with consultants, evaluation of patient's response to treatment, examination of patient, obtaining history from patient or surrogate, ordering and performing treatments and interventions, ordering and review of laboratory studies, ordering and review of radiographic studies, pulse oximetry and re-evaluation of patient's condition.  Click here for ABCD2, HEART and other calculatorsREFRESH Note before signing :1}                              Medical Decision Making Amount and/or Complexity of Data Reviewed Labs: ordered. ECG/medicine tests: ordered.  Risk OTC drugs. Prescription drug management. Decision regarding hospitalization.   Hyperkalemia.  Patient will be admitted to medicine  {Document critical care time when appropriate:1} {Document review of labs and clinical decision tools ie heart score, Chads2Vasc2 etc:1}  {Document your independent review of radiology images, and any outside records:1} {Document your discussion with family members, caretakers, and with consultants:1} {Document social determinants of health affecting pt's care:1} {Document your decision making why or why not admission, treatments were needed:1} Final Clinical Impression(s) / ED Diagnoses Final diagnoses:  Hyperkalemia    Rx / DC Orders ED Discharge Orders     None

## 2023-08-18 NOTE — Progress Notes (Signed)
Hgb 11.4 today.  Injection not indicated.  Patient discharged ambulatory in stable condition.

## 2023-08-18 NOTE — Telephone Encounter (Signed)
CRITICAL VALUE STICKER  CRITICAL VALUE:  K + 6.7  RECEIVER (on-site recipient of call):  Cynda Acres, RN  DATE & TIME NOTIFIED: 9:30 am 08/18/23  MD NOTIFIED:   [phone] or [phone] Notified patient and Transplant coordinator Belenda Cruise Lot at [facility] Transplant team.  They are to call patient with instructions, as this was ordered by Dr. Horton Marshall.  Patient made aware to report to the ER if he develops any new symptoms.  Verbalized understanding.  Patient also advised to call me back if he does not receive instruction from team within the hour.  TIME OF NOTIFICATION: 930  RESPONSE:  Maryln Manuel, RN [facility] Transplant Coordinator to contact patient with instructions

## 2023-08-18 NOTE — ED Triage Notes (Signed)
Pt had routine blood work this morning and was called to come to the ED to have his potassium rechecked K 6.8 pt has no complaints

## 2023-08-18 NOTE — Hospital Course (Addendum)
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  Pt had routine BMP done by his transplant team which revealed potassium of 6.7.  He was contacted and told to go to the ED.  The patient has had a hx of chronic hyperkalemia, usually in the 5.2-5.6 range.  He was previously on lasix for peripheral edema.  Pt states that in the past week he was started on spironolactone due to recurrently LE edema and hence the reason for the the routine BMP draw.  He denies any other new medicines or new OTC supplements.  Patient denies fevers, chills, headache, chest pain, dyspnea, nausea, vomiting, diarrhea, abdominal pain, dysuria, hematuria, hematochezia, and melena. In the ED, the patient was afebrile and hemodynamically stable with oxygen saturation 96% on RA.  EKG showed sinus rhythm with RBBB.  He was given calcium gluconate, D50+insulin, bicarbonate and a dose of Lokelma.  He was admitted to telemetry for further monitoring.

## 2023-08-19 ENCOUNTER — Other Ambulatory Visit (HOSPITAL_COMMUNITY): Payer: Self-pay

## 2023-08-19 ENCOUNTER — Encounter: Payer: Self-pay | Admitting: Hematology

## 2023-08-19 DIAGNOSIS — Z944 Liver transplant status: Secondary | ICD-10-CM | POA: Diagnosis not present

## 2023-08-19 DIAGNOSIS — D696 Thrombocytopenia, unspecified: Secondary | ICD-10-CM | POA: Diagnosis not present

## 2023-08-19 DIAGNOSIS — E875 Hyperkalemia: Secondary | ICD-10-CM | POA: Diagnosis not present

## 2023-08-19 DIAGNOSIS — N1832 Chronic kidney disease, stage 3b: Secondary | ICD-10-CM | POA: Diagnosis not present

## 2023-08-19 LAB — BASIC METABOLIC PANEL WITH GFR
Anion gap: 5 (ref 5–15)
BUN: 35 mg/dL — ABNORMAL HIGH (ref 8–23)
CO2: 20 mmol/L — ABNORMAL LOW (ref 22–32)
Calcium: 7.8 mg/dL — ABNORMAL LOW (ref 8.9–10.3)
Chloride: 113 mmol/L — ABNORMAL HIGH (ref 98–111)
Creatinine, Ser: 1.79 mg/dL — ABNORMAL HIGH (ref 0.61–1.24)
GFR, Estimated: 42 mL/min — ABNORMAL LOW
Glucose, Bld: 62 mg/dL — ABNORMAL LOW (ref 70–99)
Potassium: 5.7 mmol/L — ABNORMAL HIGH (ref 3.5–5.1)
Sodium: 138 mmol/L (ref 135–145)

## 2023-08-19 LAB — POTASSIUM: Potassium: 5.8 mmol/L — ABNORMAL HIGH (ref 3.5–5.1)

## 2023-08-19 LAB — HIV ANTIBODY (ROUTINE TESTING W REFLEX): HIV Screen 4th Generation wRfx: NONREACTIVE

## 2023-08-19 MED ORDER — SODIUM ZIRCONIUM CYCLOSILICATE 10 G PO PACK
10.0000 g | PACK | Freq: Once | ORAL | Status: AC
  Administered 2023-08-19: 10 g via ORAL
  Filled 2023-08-19: qty 1

## 2023-08-19 MED ORDER — SODIUM ZIRCONIUM CYCLOSILICATE 10 G PO PACK
10.0000 g | PACK | Freq: Four times a day (QID) | ORAL | Status: AC
  Administered 2023-08-19 – 2023-08-20 (×4): 10 g via ORAL
  Filled 2023-08-19 (×4): qty 1

## 2023-08-19 NOTE — TOC Benefit Eligibility Note (Signed)
Patient Product/process development scientist completed.    The patient is insured through [employer] [facility]. Patient has [employer], may use a copay card, and/or apply for patient assistance if available.    Ran test claim for Lokelma 10 g pack and Non Formulary   This test claim was processed through [facility]- copay amounts may vary at other pharmacies due to [employer], or as the patient moves through the different stages of their insurance plan.     Roland Earl, CPHT Pharmacy Technician III Certified Patient Advocate [facility] Patient Advocate Team Direct Number: [phone]  Fax: [phone]

## 2023-08-19 NOTE — Progress Notes (Signed)
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  Pt had routine BMP done by his transplant team which revealed potassium of 6.7.  He was contacted and told to go to the ED.  The patient has had a hx of chronic hyperkalemia, usually in the 5.2-5.6 range.  He was previously on lasix for peripheral edema.  Pt states that in the past week he was started on spironolactone due to recurrently LE edema and hence the reason for the the routine BMP draw.  He denies any other new medicines or new OTC supplements.  Patient denies fevers, chills, headache, chest pain, dyspnea, nausea, vomiting, diarrhea, abdominal pain, dysuria, hematuria, hematochezia, and melena. In the ED, the patient was afebrile and hemodynamically stable with oxygen saturation 96% on RA.  EKG showed sinus rhythm with RBBB.  He was given calcium gluconate, D50+insulin, bicarbonate and a dose of Lokelma.  He was admitted to telemetry for further monitoring.     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    Objective: Vitals:   08/19/23 0014 08/19/23 0546 08/19/23 0900 08/19/23 1310  BP: (!) 127/56 (!) 139/57  (!) 143/61  Pulse: (!) 59 66  70  Resp: 18 18 15 18   Temp: 98.9 F (37.2 C) 98.9 F (37.2 C)  98.5 F (36.9 C)  TempSrc:      SpO2: 99% 97%  99%  Weight:      Height:        Intake/Output Summary (Last 24 hours) at 08/19/2023 1401 Last data filed at 08/19/2023 0500 Gross per 24 hour  Intake 294.6 ml  Output --  Net 294.6 ml   Weight change:  Exam:  General:  Pt is alert, follows commands appropriately, not in acute distress HEENT: No icterus, No thrush, No neck mass, [place]/AT Cardiovascular: RRR, S1/S2, no rubs, no gallops Respiratory: CTA bilaterally, no wheezing, no crackles, no rhonchi Abdomen: Soft/+BS, non tender, non distended, no guarding Extremities: No edema, No lymphangitis, No petechiae, No rashes, no synovitis   Data Reviewed: I have personally reviewed following labs and imaging studies Basic Metabolic Panel: Recent Labs  Lab 08/13/23 1001 08/18/23 0907 08/18/23 1131 08/18/23 1652 08/19/23 0501 08/19/23 1238  NA 139 139 138  --  138  --  K 5.1 6.7* 6.8* 6.0* 5.7* 5.8*  CL 112* 113* 111  --  113*  --   CO2 24 22 22   --  20*  --   GLUCOSE 93 87 78  --  62*  --   BUN 36* 42* 42*  --  35*  --   CREATININE 1.89* 2.10* 2.11*  --  1.79*  --   CALCIUM 7.7* 8.0* 7.9*  --  7.8*  --   MG 1.8 2.0  --   --   --   --    Liver Function Tests: Recent Labs  Lab 08/13/23 1001 08/18/23 0907  AST 26 21  ALT 27 20  ALKPHOS 95 105  BILITOT 0.5 0.5  PROT 4.4* 4.8*  ALBUMIN 2.5* 2.6*   No results for input(s): "LIPASE",  "AMYLASE" in the last 168 hours. No results for input(s): "AMMONIA" in the last 168 hours. Coagulation Profile: No results for input(s): "INR", "PROTIME" in the last 168 hours. CBC: Recent Labs  Lab 08/13/23 1001 08/18/23 0907  WBC 4.4 3.0*  NEUTROABS 2.2 1.7  HGB 11.4* 11.4*  HCT 37.7* 36.5*  MCV 105.6* 105.5*  PLT 128* 145*   Cardiac Enzymes: No results for input(s): "CKTOTAL", "CKMB", "CKMBINDEX", "TROPONINI" in the last 168 hours. BNP: Invalid input(s): "POCBNP" CBG: Recent Labs  Lab 08/18/23 1407 08/18/23 1416 08/18/23 1933  GLUCAP 13* 154* 145*   HbA1C: No results for input(s): "HGBA1C" in the last 72 hours. Urine analysis:    Component Value Date/Time   COLORURINE STRAW (A) 02/11/2023 1303   APPEARANCEUR CLEAR 02/11/2023 1303   APPEARANCEUR Clear 06/22/2022 0959   LABSPEC 1.009 02/11/2023 1303   PHURINE 5.0 02/11/2023 1303   GLUCOSEU NEGATIVE 02/11/2023 1303   HGBUR NEGATIVE 02/11/2023 1303   BILIRUBINUR NEGATIVE 02/11/2023 1303   BILIRUBINUR Negative 06/22/2022 0959   KETONESUR NEGATIVE 02/11/2023 1303   PROTEINUR NEGATIVE 02/11/2023 1303   NITRITE NEGATIVE 02/11/2023 1303   LEUKOCYTESUR NEGATIVE 02/11/2023 1303   Sepsis Labs: @LABRCNTIP (procalcitonin:4,lacticidven:4) )No results found for this or any previous visit (from the past 240 hour(s)).   Scheduled Meds:  aspirin EC  81 mg Oral Daily   carvedilol  6.25 mg Oral BID WC   enoxaparin (LOVENOX) injection  40 mg Subcutaneous Q24H   mycophenolate  180 mg Oral BID   pantoprazole  40 mg Oral BID   sodium zirconium cyclosilicate  10 g Oral Q6H   tacrolimus  2 mg Oral Daily   tacrolimus  3 mg Oral QHS   tamsulosin  0.4 mg Oral Daily   cyanocobalamin  500 mcg Oral Daily   Continuous Infusions:  sodium chloride 75 mL/hr at 08/19/23 6578    Procedures/Studies: No results found.  Catarina Hartshorn, DO  [facility]  If 7PM-7AM, please contact night-coverage [email or web address] Password  [email or web address] 08/19/2023, 2:01 PM   LOS: 0 days

## 2023-08-19 NOTE — Progress Notes (Signed)
   08/19/23 1001  TOC Brief Assessment  Insurance and Status Reviewed  Patient has primary care physician Yes  Home environment has been reviewed from home  Prior level of function: independent  Prior/Current Home Services No current home services  Social Determinants of Health Reivew SDOH reviewed no interventions necessary  Readmission risk has been reviewed Yes  Transition of care needs no transition of care needs at this time    Transition of Care Department [facility]) has reviewed patient and no TOC needs have been identified at this time. We will continue to monitor patient advancement through interdisciplinary progression rounds. If new patient transition needs arise, please place a TOC consult.

## 2023-08-20 DIAGNOSIS — E875 Hyperkalemia: Secondary | ICD-10-CM | POA: Diagnosis not present

## 2023-08-20 DIAGNOSIS — D696 Thrombocytopenia, unspecified: Secondary | ICD-10-CM | POA: Diagnosis not present

## 2023-08-20 DIAGNOSIS — N1832 Chronic kidney disease, stage 3b: Secondary | ICD-10-CM | POA: Diagnosis not present

## 2023-08-20 DIAGNOSIS — Z944 Liver transplant status: Secondary | ICD-10-CM | POA: Diagnosis not present

## 2023-08-20 LAB — BASIC METABOLIC PANEL
Anion gap: 4 — ABNORMAL LOW (ref 5–15)
BUN: 31 mg/dL — ABNORMAL HIGH (ref 8–23)
CO2: 21 mmol/L — ABNORMAL LOW (ref 22–32)
Calcium: 7.7 mg/dL — ABNORMAL LOW (ref 8.9–10.3)
Chloride: 111 mmol/L (ref 98–111)
Creatinine, Ser: 1.78 mg/dL — ABNORMAL HIGH (ref 0.61–1.24)
GFR, Estimated: 42 mL/min — ABNORMAL LOW (ref >60)
Glucose, Bld: 70 mg/dL (ref 70–99)
Potassium: 5 mmol/L (ref 3.5–5.1)
Sodium: 136 mmol/L (ref 135–145)

## 2023-08-20 NOTE — Plan of Care (Signed)
  Problem: Education: Goal: Knowledge of General Education information will improve Description: Including pain rating scale, medication(s)/side effects and non-pharmacologic comfort measures Outcome: Adequate for Discharge   Problem: Health Behavior/Discharge Planning: Goal: Ability to manage health-related needs will improve Outcome: Adequate for Discharge   Problem: Clinical Measurements: Goal: Ability to maintain clinical measurements within normal limits will improve Outcome: Adequate for Discharge Goal: Will remain free from infection Outcome: Adequate for Discharge Goal: Diagnostic test results will improve Outcome: Adequate for Discharge Goal: Respiratory complications will improve Outcome: Adequate for Discharge Goal: Cardiovascular complication will be avoided Outcome: Adequate for Discharge   Problem: Activity: Goal: Risk for activity intolerance will decrease Outcome: Adequate for Discharge   Problem: Nutrition: Goal: Adequate nutrition will be maintained Outcome: Adequate for Discharge   Problem: Coping: Goal: Level of anxiety will decrease Outcome: Adequate for Discharge   Problem: Elimination: Goal: Will not experience complications related to bowel motility Outcome: Adequate for Discharge Goal: Will not experience complications related to urinary retention Outcome: Adequate for Discharge   Problem: Pain Managment: Goal: General experience of comfort will improve Outcome: Adequate for Discharge   Problem: Safety: Goal: Ability to remain free from injury will improve Outcome: Adequate for Discharge   Problem: Skin Integrity: Goal: Risk for impaired skin integrity will decrease Outcome: Adequate for Discharge   Problem: Education: Goal: Understanding of post-operative needs will improve Outcome: Adequate for Discharge Goal: Individualized Educational Video(s) Outcome: Adequate for Discharge   Problem: Clinical Measurements: Goal: Postoperative  complications will be avoided or minimized Outcome: Adequate for Discharge   Problem: Respiratory: Goal: Will regain and/or maintain adequate ventilation Outcome: Adequate for Discharge

## 2023-08-20 NOTE — Discharge Summary (Signed)
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  Pt had routine BMP done by his transplant team which revealed potassium of 6.7.  He was contacted and told to go to the ED.  The patient has had a hx of chronic hyperkalemia, usually in the 5.2-5.6 range.  He was previously on lasix for peripheral edema.  Pt states that in the past week he was started on spironolactone due to recurrently LE edema and hence the reason for the the routine BMP draw.  He denies any other new medicines or new OTC supplements.  Patient denies fevers, chills, headache, chest pain, dyspnea, nausea, vomiting, diarrhea, abdominal pain, dysuria, hematuria, hematochezia, and melena. In the ED, the patient was afebrile and hemodynamically stable with oxygen saturation 96% on RA.  EKG showed sinus rhythm with RBBB.  He was given calcium gluconate, D50+insulin, bicarbonate and a dose of Lokelma.  He was admitted to telemetry for further monitoring.    Assessment and Plan:  Hyperkalemia -worsening due to recent spironolactone -temporizing measures given in ED -Lokelma--given 3 doses initial 24 hours -K remains 5.8>>improved to 5.0 on day of dc -discussed with nephrology -continued Lokelma around the clock for next 24 hours -left message for patient's transplant coordinator -judicious IVF -discussed with pharmacy--will get patient discount voucher for [facility] at time of d/c -pt given samples of Lokelma and voucher for Lokelma x 1 month at time of dc   NASH  Cirrhosis s/p liver transplant -continue tacrolimus 2 mg AM, 3 mg hs -continue mycophenolate 180 mg bid   HTN -continue coreg 6.25 mg bid   CKD 3B -baseline creatinine 1.6-1.9 -serum creatinine 1.78 on day of dc   RBBB -chronic  -noted on EKG 07/29/21 previously   Thrombocytopenia -chronic -monitor for signs of bleed -stable       Consultants: none Procedures performed: none  Disposition: Home Diet recommendation:  Low potassium DISCHARGE MEDICATION: Allergies as of 08/20/2023       Reactions   Penicillins Other (See Comments)   As a baby, unknown reaction Did it involve swelling of the face/tongue/throat, SOB, or low BP? Unknown Did it involve sudden or severe rash/hives, skin peeling, or any reaction on the inside of your mouth or nose? Unknown Did you need to seek medical attention at a hospital or doctor's office? Unknown When did it last happen?      infant allergy If all above answers are "NO", may proceed with cephalosporin use. .   Gluten Meal Diarrhea   Biaxin [clarithromycin] Tinitus   Ears ringing, loss sense of smell        Medication List     STOP taking these medications    spironolactone 50 MG tablet Commonly known as: ALDACTONE       TAKE these medications    aspirin EC 81 MG tablet Take by mouth.   carvedilol 6.25 MG tablet Commonly known as: COREG Take 6.25 mg by mouth 2 (two) times daily with a meal.  cyanocobalamin 500 MCG tablet Commonly known as: VITAMIN B12 Take 1 tablet (500 mcg total) by mouth daily.   hydrocortisone 25 MG suppository Commonly known as: ANUSOL-HC Place 1 suppository (25 mg total) rectally 2 (two) times daily as needed for hemorrhoids or anal itching.   MG Plus Protein 133 MG Tabs Take by mouth.   mycophenolate 180 MG EC tablet Commonly known as: MYFORTIC Take 180 mg by mouth 2 (two) times daily.   pantoprazole 40 MG tablet Commonly known as: PROTONIX Take 40 mg by mouth 2 (two) times daily.    tacrolimus 1 MG capsule Commonly known as: PROGRAF Take 2-3 mg by mouth 2 (two) times daily. Taking 2 mg in the morning and 3 mg at night   tamsulosin 0.4 MG Caps capsule Commonly known as: FLOMAX Take 0.4 mg by mouth daily.        Discharge Exam: Filed Weights   08/18/23 1633  Weight: 80.4 kg   HEENT:  [place]/AT, No thrush, no icterus CV:  RRR, no rub, no S3, no S4 Lung:  CTA, no wheeze, no rhonchi Abd:  soft/+BS, NT Ext:  No edema, no lymphangitis, no synovitis, no rash   Condition at discharge: stable  The results of significant diagnostics from this hospitalization (including imaging, microbiology, ancillary and laboratory) are listed below for reference.   Imaging Studies: No results found.  Microbiology: Results for orders placed or performed in visit on 05/11/23  C difficile Toxins A+B W/Rflx     Status: None   Collection Time: 05/12/23 11:00 AM   Specimen: Stool   ST  Result Value Ref Range Status   C difficile Toxins A+B, EIA Negative Negative Final  C difficile, Cytotoxin B     Status: None   Collection Time: 05/12/23 11:00 AM   ST  Result Value Ref Range Status   C diff Toxin B Final report  Final    Comment:                             Reference Range: Negative   Result 1 Comment  Final    Comment: Negative No cytotoxin detected.     Labs: CBC: Recent Labs  Lab 08/18/23 0907  WBC 3.0*  NEUTROABS 1.7  HGB 11.4*  HCT 36.5*  MCV 105.5*  PLT 145*   Basic Metabolic Panel: Recent Labs  Lab 08/18/23 0907 08/18/23 1131 08/18/23 1652 08/19/23 0501 08/19/23 1238 08/20/23 0455  NA 139 138  --  138  --  136  K 6.7* 6.8* 6.0* 5.7* 5.8* 5.0  CL 113* 111  --  113*  --  111  CO2 22 22  --  20*  --  21*  GLUCOSE 87 78  --  62*  --  70  BUN 42* 42*  --  35*  --  31*  CREATININE 2.10* 2.11*  --  1.79*  --  1.78*  CALCIUM 8.0* 7.9*  --  7.8*  --  7.7*  MG 2.0  --   --   --   --   --    Liver Function Tests: Recent Labs  Lab 08/18/23 0907   AST 21  ALT 20  ALKPHOS 105  BILITOT 0.5  PROT 4.8*  ALBUMIN 2.6*   CBG: Recent Labs  Lab 08/18/23 1407 08/18/23 1416 08/18/23 1933  GLUCAP 13* 154* 145*    Discharge time spent: greater than 30 minutes.  Signed: Catarina Hartshorn, MD  [facility] 08/20/2023

## 2023-08-20 NOTE — TOC Progression Note (Signed)
Transition of Care [facility]) - Progression Note    Patient Details  Name: JB KODA MRN: [id number] Date of Birth: 1961-08-30  Transition [facility] CM/SW Contact  Elliot Gault, LCSW Phone Number: 08/20/2023, 11:19 AM  Clinical Narrative:      Pt medically stable for dc per MD. Received TOC consult for medication assistance. Spoke with MD and pharmacist yesterday regarding medication coverage at dc. Per MD, specialist bringing by samples of the medication and pt to follow up with his transplant team for further assistance on getting auth from the insurance for this non formulary drug.  No further TOC need.      Expected Discharge Plan and Services         Expected Discharge Date: 08/20/23                                     Social Determinants of Health (SDOH) Interventions SDOH Screenings   Food Insecurity: No Food Insecurity (08/18/2023)  Housing: Low Risk  (08/18/2023)  Transportation Needs: No Transportation Needs (08/18/2023)  Utilities: Not At Risk (08/18/2023)  Depression (PHQ2-9): Low Risk  (03/17/2023)  Financial Resource Strain: Low Risk  (08/18/2023)   Received from [facility]  Tobacco Use: Low Risk  (08/18/2023)    Readmission Risk Interventions     No data to display

## 2023-08-20 NOTE — Plan of Care (Signed)
  Problem: Clinical Measurements: Goal: Ability to maintain clinical measurements within normal limits will improve Outcome: Progressing Goal: Cardiovascular complication will be avoided Outcome: Progressing   Problem: Activity: Goal: Risk for activity intolerance will decrease Outcome: Progressing   Problem: Coping: Goal: Level of anxiety will decrease Outcome: Progressing   Problem: Elimination: Goal: Will not experience complications related to bowel motility Outcome: Progressing   Problem: Education: Goal: Understanding of post-operative needs will improve Outcome: Progressing   Problem: Clinical Measurements: Goal: Postoperative complications will be avoided or minimized Outcome: Progressing

## 2023-08-21 LAB — TACROLIMUS LEVEL: Tacrolimus (FK506) - LabCorp: 6.3 ng/mL (ref 2.0–20.0)

## 2023-08-26 ENCOUNTER — Telehealth: Payer: Self-pay | Admitting: Internal Medicine

## 2023-08-26 ENCOUNTER — Other Ambulatory Visit (HOSPITAL_COMMUNITY)
Admission: RE | Admit: 2023-08-26 | Discharge: 2023-08-26 | Disposition: A | Discharge status: Home / Self Care | Payer: BC Managed Care – PPO | Source: Ambulatory Visit

## 2023-08-26 DIAGNOSIS — Z944 Liver transplant status: Secondary | ICD-10-CM | POA: Insufficient documentation

## 2023-08-26 DIAGNOSIS — Z7901 Long term (current) use of anticoagulants: Secondary | ICD-10-CM | POA: Diagnosis not present

## 2023-08-26 DIAGNOSIS — D849 Immunodeficiency, unspecified: Secondary | ICD-10-CM | POA: Diagnosis not present

## 2023-08-26 LAB — COMPREHENSIVE METABOLIC PANEL
ALT: 20 U/L (ref 0–44)
AST: 19 U/L (ref 15–41)
Albumin: 2.6 g/dL — ABNORMAL LOW (ref 3.5–5.0)
Alkaline Phosphatase: 95 U/L (ref 38–126)
Anion gap: 8 (ref 5–15)
BUN: 42 mg/dL — ABNORMAL HIGH (ref 8–23)
CO2: 21 mmol/L — ABNORMAL LOW (ref 22–32)
Calcium: 7.7 mg/dL — ABNORMAL LOW (ref 8.9–10.3)
Chloride: 110 mmol/L (ref 98–111)
Creatinine, Ser: 2.04 mg/dL — ABNORMAL HIGH (ref 0.61–1.24)
GFR, Estimated: 36 mL/min — ABNORMAL LOW (ref >60)
Glucose, Bld: 101 mg/dL — ABNORMAL HIGH (ref 70–99)
Potassium: 4.6 mmol/L (ref 3.5–5.1)
Sodium: 139 mmol/L (ref 135–145)
Total Bilirubin: 0.3 mg/dL (ref 0.3–1.2)
Total Protein: 5.1 g/dL — ABNORMAL LOW (ref 6.5–8.1)

## 2023-08-26 LAB — CBC WITH DIFFERENTIAL/PLATELET
Abs Immature Granulocytes: 0.03 10*3/uL (ref 0.00–0.07)
Basophils Absolute: 0 10*3/uL (ref 0.0–0.1)
Basophils Relative: 1 %
Eosinophils Absolute: 0.1 10*3/uL (ref 0.0–0.5)
Eosinophils Relative: 4 %
HCT: 35.6 % — ABNORMAL LOW (ref 39.0–52.0)
Hemoglobin: 10.8 g/dL — ABNORMAL LOW (ref 13.0–17.0)
Immature Granulocytes: 1 %
Lymphocytes Relative: 29 %
Lymphs Abs: 0.8 10*3/uL (ref 0.7–4.0)
MCH: 31.4 pg (ref 26.0–34.0)
MCHC: 30.3 g/dL (ref 30.0–36.0)
MCV: 103.5 fL — ABNORMAL HIGH (ref 80.0–100.0)
Monocytes Absolute: 0.4 10*3/uL (ref 0.1–1.0)
Monocytes Relative: 15 %
Neutro Abs: 1.4 10*3/uL — ABNORMAL LOW (ref 1.7–7.7)
Neutrophils Relative %: 50 %
Platelets: 149 10*3/uL — ABNORMAL LOW (ref 150–400)
RBC: 3.44 MIL/uL — ABNORMAL LOW (ref 4.22–5.81)
RDW: 13.2 % (ref 11.5–15.5)
WBC: 2.9 10*3/uL — ABNORMAL LOW (ref 4.0–10.5)
nRBC: 0 % (ref 0.0–0.2)

## 2023-08-26 LAB — MAGNESIUM: Magnesium: 1.9 mg/dL (ref 1.7–2.4)

## 2023-08-26 NOTE — Telephone Encounter (Signed)
FYI, Called patient to schedule a TOC patient would keep original appt for 09/16/2023 he is doing fine.

## 2023-08-29 LAB — TACROLIMUS LEVEL: Tacrolimus (FK506) - LabCorp: 7.9 ng/mL (ref 2.0–20.0)

## 2023-08-31 ENCOUNTER — Encounter: Payer: Self-pay | Admitting: Internal Medicine

## 2023-08-31 ENCOUNTER — Ambulatory Visit: Payer: BC Managed Care – PPO | Admitting: Internal Medicine

## 2023-08-31 VITALS — BP 127/73 | HR 61 | Temp 97.8°F | Ht 67.0 in | Wt 179.4 lb

## 2023-08-31 DIAGNOSIS — R197 Diarrhea, unspecified: Secondary | ICD-10-CM

## 2023-08-31 DIAGNOSIS — N1832 Chronic kidney disease, stage 3b: Secondary | ICD-10-CM | POA: Diagnosis not present

## 2023-08-31 DIAGNOSIS — K219 Gastro-esophageal reflux disease without esophagitis: Secondary | ICD-10-CM

## 2023-08-31 NOTE — Patient Instructions (Signed)
It was good to see you again today!  I recommend you drop back on your Protonix to 40 mg once daily 30 minutes before breakfast  Fully agree with limiting potassium in your diet.  Elimination of gluten is much as possible.  Use Imodium as needed for diarrhea not to exceed label instructions  Keep appointment with hematology coming up.  Repeat labs as planned  Unless something comes up, plan to see you back in the office in 6 weeks

## 2023-08-31 NOTE — Progress Notes (Signed)
Primary Care Physician:  [name], MD Primary Gastroenterologist:  Dr. Jena Gauss  Pre-Procedure History & Physical: HPI:  Michael Harmon is a 62 y.o. male here for follow-up;  status post orthotopic liver transplant March of this year.  Course complicated by chronic kidney disease and recent hyperkalemia for which is admitted to [facility] and transferred down to [facility].  Developed severe hypoglycemia with glucose, insulin injection. Some progression of chronic kidney disease noted.  He is on a potassium restricted diet  - spironolactone discontinued.  On twice daily Protonix hasreflux symptoms well-controlled. He sees hematology here at [facility] on a regular basis;  office visit labs coming up. Developed granulocytopenia since I last saw him requiring GSF therapy down at [facility]. Weight up 4 pounds over that when he was seen in the office previously.  Superficial clot at IV site left forearm from [facility] admission doing much better. He continues on a gluten-free diet for celiac disease.  Managing diarrhea with Imodium. Past Medical History:  Diagnosis Date   Allergy    nose and sinus problems   Anemia    Anemia in chronic kidney disease (CKD) 11/23/2022   Asthma    Celiac disease    Cirrhosis, cryptogenic (HCC)    completed Hep A and B vaccines   DM (diabetes mellitus) (HCC)    Encounter for general adult medical examination with abnormal findings 09/08/2022   GERD (gastroesophageal reflux disease)    Hyperlipidemia    diet controlled now with 100 pound weight loss   Hypertension    Iron deficiency anemia due to chronic blood loss 08/13/2021   Malignant neoplasm of prostate (HCC) 10/03/2021   Murmur, heart 03/02/2022   Narrow angle glaucoma suspect of both eyes    Renal cyst 09/03/2017   right   Situational depression 07/06/2017   Splenomegaly    Thrombocytopenia (HCC)    hematology, ?related to chol med (tricor) and glimeripide, just being monitored, above 100,000     Past Surgical History:  Procedure Laterality Date   BIOPSY  07/09/2016   Procedure: BIOPSY;  Surgeon: Corbin Ade, MD;  [facility];  Service: Endoscopy;;  duodenal, gastric, esophaageal   BIOPSY  07/31/2021   Procedure: BIOPSY;  Surgeon: Corbin Ade, MD;  [facility];  Service: Endoscopy;;   CLOSED MANIPULATION SHOULDER     COLONOSCOPY  11/2015   Dr. Allena Katz: cecal bx neg for microscopic colitis. ascending colon polyp (adenomatous)   COLONOSCOPY WITH PROPOFOL N/A 07/31/2021   Procedure: COLONOSCOPY WITH PROPOFOL;  Surgeon: Corbin Ade, MD;  [facility];  Service: Endoscopy;  Laterality: N/A;  7:30am   ESOPHAGEAL BANDING N/A 08/04/2017   Procedure: ESOPHAGEAL BANDING;  Surgeon: Corbin Ade, MD;  [facility];  Service: Endoscopy;  Laterality: N/A;  esophageal varices banding   ESOPHAGEAL BANDING N/A 10/13/2017   Procedure: ESOPHAGEAL BANDING;  Surgeon: Corbin Ade, MD;  [facility];  Service: Endoscopy;  Laterality: N/A;   ESOPHAGEAL BANDING N/A 04/09/2020   Procedure: ESOPHAGEAL BANDING;  Surgeon: Corbin Ade, MD;  [facility];  Service: Endoscopy;  Laterality: N/A;   ESOPHAGOGASTRODUODENOSCOPY N/A 07/09/2016   Procedure: ESOPHAGOGASTRODUODENOSCOPY (EGD);  Surgeon: Corbin Ade, MD;  [facility];  Service: Endoscopy;  Laterality: N/A;  730   ESOPHAGOGASTRODUODENOSCOPY N/A 08/04/2017   Procedure: ESOPHAGOGASTRODUODENOSCOPY (EGD);  Surgeon: Corbin Ade, MD;  [facility];  Service: Endoscopy;  Laterality: N/A;  7:30am   ESOPHAGOGASTRODUODENOSCOPY N/A 10/13/2017   Dr. Jena Gauss: grade 2 esophageal varices status post banding, portal hypertensive gastropathy   ESOPHAGOGASTRODUODENOSCOPY  11/23/2019   [name]: Esophageal varices, 4 columns of grade 2-3 status post banding.  Varices more prominent than seen in December 2018.  Portal gastropathy.  Normal-appearing small bowel.    ESOPHAGOGASTRODUODENOSCOPY (EGD) WITH PROPOFOL N/A 04/09/2020   Dr. Jena Gauss: 4 columns of grade 2/grade 3 esophageal varices somewhat recalcitrant.  Status post esophageal band ligation today.  Portal gastropathy.  Normal-appearing small bowel.   ESOPHAGOGASTRODUODENOSCOPY (EGD) WITH PROPOFOL N/A 07/31/2021   Procedure: ESOPHAGOGASTRODUODENOSCOPY (EGD) WITH PROPOFOL;  Surgeon: Corbin Ade, MD;  [facility];  Service: Endoscopy;  Laterality: N/A;   GOLD SEED IMPLANT N/A 01/15/2022   Procedure: GOLD SEED IMPLANT;  Surgeon: Malen Gauze, MD;  Location: [facility];  Service: Urology;  Laterality: N/A;   LIVER TRANSPLANT  01/16/2023   SPACE OAR INSTILLATION N/A 01/15/2022   Procedure: SPACE OAR INSTILLATION;  Surgeon: Malen Gauze, MD;  Location: [facility];  Service: Urology;  Laterality: N/A;   US PARACENTESIS  06/10/2022    Prior to Admission medications   Medication Sig Start Date End Date Taking? Authorizing Provider  aspirin EC 81 MG tablet Take by mouth. 01/18/23  Yes [provider]  carvedilol (COREG) 6.25 MG tablet Take 6.25 mg by mouth 2 (two) times daily with a meal. 08/11/23 08/10/24 Yes [provider]  Continuous Glucose Sensor (FREESTYLE LIBRE 2 SENSOR) MISC See admin instructions. 08/23/23  Yes [provider]  cyanocobalamin (VITAMIN B12) 500 MCG tablet Take 1 tablet (500 mcg total) by mouth daily. 03/30/23  Yes Pennington, Rebekah M, PA-C  fludrocortisone (FLORINEF) 0.1 MG tablet Take by mouth. 08/24/23 11/22/23 Yes [provider]  Multiple Vitamins-Minerals (CITRACAL +D3) TABS Take by mouth.   Yes [provider]  mycophenolate (MYFORTIC) 180 MG EC tablet Take 180 mg by mouth 2 (two) times daily.   Yes [provider]  pantoprazole (PROTONIX) 40 MG tablet Take 40 mg by mouth 2 (two) times daily.   Yes [provider]  tacrolimus (PROGRAF) 1 MG capsule Take 2-3 mg by mouth 2 (two) times daily. Taking 2 mg in  the morning and 3 mg at night 03/03/23 03/02/24 Yes [provider]  tamsulosin (FLOMAX) 0.4 MG CAPS capsule Take 0.4 mg by mouth daily. 06/21/23  Yes [provider]  torsemide (DEMADEX) 10 MG tablet Take 1 tablet by mouth daily. 08/24/23 11/22/23 Yes [provider]    Allergies as of 08/31/2023 - Review Complete 08/31/2023  Allergen Reaction Noted   Penicillins Other (See Comments) 04/07/2016   Gluten meal Diarrhea 10/06/2017   Biaxin [clarithromycin] Tinitus     Family History  Problem Relation Age of Onset   Other Mother        chronic diarrhea   COPD Mother    Heart disease Mother        died at 67   Arthritis Mother    Cancer Mother    Depression Mother    Diabetes Mother    Hyperlipidemia Mother    Hypertension Mother    Stroke Mother    Arthritis Father    Asthma Father    Birth defects Father    Heart disease Father        aortic valve replaced   Heart disease Maternal Grandmother    Stroke Maternal Grandmother    Heart disease Maternal Grandfather  Stroke Maternal Grandfather    Diabetes Paternal Grandmother    Stroke Paternal Grandfather    Heart disease Paternal Grandfather    Liver disease Neg Hx    Colon cancer Neg Hx     Social History   Socioeconomic History   Marital status: Married    Spouse name: Melissa   Number of children: 0   Years of education: 14   Highest education level: Not on file  Occupational History   Occupation: Lexicographer, 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   Has no children.   Eats all food groups.   Works for [employer]       Social  Determinants of Health   Financial Resource Strain: Low Risk  (08/24/2023)   Received from [facility]   Overall Financial Resource Strain (CARDIA)    Difficulty of Paying Living Expenses: Not hard at all  Food Insecurity: No Food Insecurity (08/24/2023)   Received from [facility]   Hunger Vital Sign    Worried About Running Out of Food in the Last Year: Never true    Ran Out of Food in the Last Year: Never true  Transportation Needs: No Transportation Needs (08/24/2023)   Received from [facility] - Transportation    In the past 12 months, has lack of transportation kept you from medical appointments or from getting medications?: No    Lack of Transportation (Non-Medical): No  Physical Activity: Not on file  Stress: Not on file  Social Connections: Not on file  Intimate Partner Violence: Not At Risk (08/18/2023)   Humiliation, Afraid, Rape, and Kick questionnaire    Fear of Current or Ex-Partner: No    Emotionally Abused: No    Physically Abused: No    Sexually Abused: No    Review of Systems: See HPI, otherwise negative ROS  Physical Exam: BP 127/73 (BP Location: Left Arm, Patient Position: Sitting, Cuff Size: Normal)   Pulse 61   Temp 97.8 F (36.6 C) (Oral)   Ht 5\' 7"  (1.702 m)   Wt 179 lb 6.4 oz (81.4 kg)   SpO2 95%   BMI 28.10 kg/m  General:   Alert,  pleasant and cooperative in NAD Lungs:  Clear throughout to auscultation.   No wheezes, crackles, or rhonchi. No acute distress. Heart:  Regular rate and rhythm; no murmurs, clicks, rubs,  or gallops. Abdomen: Non-distended, normal bowel sounds.  Soft and nontender without appreciable mass or hepatosplenomegaly.  Extremities:  Without clubbing or edema.  Impression/Plan: Pleasant 62 year old gentleman with celiac disease, cirrhosis secondary to NASH status post orthotopic liver transplantation back in March of this year.  Overall doing well has noted to  have some progressing renal dysfunction and presented with hyperkalemia recently for which he was admitted to [facility]  -subsequent transfer down to [facility]; furosemide switched to torsemide; spironolactone held.   Also he developed leukopenia requiring granulocyte colony-stimulating factor therapy.  Diarrhea has improved.  Reflux well-controlled on twice daily PPI; I suspect he can drop back to once a  day.  He appears euvolemic today.  Recommendations:   I recommend decrease Protonix to 40 mg once daily 30 minutes before breakfast  Fully agree with limiting potassium in  diet.  Elimination of gluten as much as possible.  Use Imodium as needed for diarrhea not to exceed label instructions  Keep appointment with hematology coming up.  Repeat labs as planned  Unless something comes up, plan to see patient back in the office in 6 weeks      Notice: This dictation was prepared with Dragon dictation along with smaller phrase technology. Any transcriptional errors that result from this process are unintentional and may not be corrected upon review.

## 2023-09-08 ENCOUNTER — Other Ambulatory Visit: Payer: Self-pay

## 2023-09-08 ENCOUNTER — Inpatient Hospital Stay: Payer: BC Managed Care – PPO | Attending: Physician Assistant

## 2023-09-08 ENCOUNTER — Inpatient Hospital Stay: Payer: BC Managed Care – PPO

## 2023-09-08 DIAGNOSIS — E611 Iron deficiency: Secondary | ICD-10-CM | POA: Diagnosis not present

## 2023-09-08 DIAGNOSIS — K7460 Unspecified cirrhosis of liver: Secondary | ICD-10-CM | POA: Diagnosis not present

## 2023-09-08 DIAGNOSIS — D509 Iron deficiency anemia, unspecified: Secondary | ICD-10-CM | POA: Insufficient documentation

## 2023-09-08 DIAGNOSIS — D696 Thrombocytopenia, unspecified: Secondary | ICD-10-CM

## 2023-09-08 DIAGNOSIS — Z944 Liver transplant status: Secondary | ICD-10-CM | POA: Diagnosis not present

## 2023-09-08 DIAGNOSIS — D61818 Other pancytopenia: Secondary | ICD-10-CM | POA: Diagnosis not present

## 2023-09-08 DIAGNOSIS — D631 Anemia in chronic kidney disease: Secondary | ICD-10-CM | POA: Diagnosis not present

## 2023-09-08 DIAGNOSIS — N1832 Chronic kidney disease, stage 3b: Secondary | ICD-10-CM | POA: Insufficient documentation

## 2023-09-08 LAB — IRON AND TIBC
Iron: 87 ug/dL (ref 45–182)
Saturation Ratios: 62 % — ABNORMAL HIGH (ref 17.9–39.5)
TIBC: 141 ug/dL — ABNORMAL LOW (ref 250–450)
UIBC: 54 ug/dL

## 2023-09-08 LAB — CBC WITH DIFFERENTIAL/PLATELET
Abs Immature Granulocytes: 0.02 10*3/uL (ref 0.00–0.07)
Basophils Absolute: 0 10*3/uL (ref 0.0–0.1)
Basophils Relative: 1 %
Eosinophils Absolute: 0.3 10*3/uL (ref 0.0–0.5)
Eosinophils Relative: 7 %
HCT: 33.5 % — ABNORMAL LOW (ref 39.0–52.0)
Hemoglobin: 10.6 g/dL — ABNORMAL LOW (ref 13.0–17.0)
Immature Granulocytes: 1 %
Lymphocytes Relative: 17 %
Lymphs Abs: 0.6 10*3/uL — ABNORMAL LOW (ref 0.7–4.0)
MCH: 32.3 pg (ref 26.0–34.0)
MCHC: 31.6 g/dL (ref 30.0–36.0)
MCV: 102.1 fL — ABNORMAL HIGH (ref 80.0–100.0)
Monocytes Absolute: 0.6 10*3/uL (ref 0.1–1.0)
Monocytes Relative: 15 %
Neutro Abs: 2.3 10*3/uL (ref 1.7–7.7)
Neutrophils Relative %: 59 %
Platelets: 114 10*3/uL — ABNORMAL LOW (ref 150–400)
RBC: 3.28 MIL/uL — ABNORMAL LOW (ref 4.22–5.81)
RDW: 13.9 % (ref 11.5–15.5)
WBC: 3.8 10*3/uL — ABNORMAL LOW (ref 4.0–10.5)
nRBC: 0 % (ref 0.0–0.2)

## 2023-09-08 LAB — COMPREHENSIVE METABOLIC PANEL
ALT: 34 U/L (ref 0–44)
AST: 27 U/L (ref 15–41)
Albumin: 2.5 g/dL — ABNORMAL LOW (ref 3.5–5.0)
Alkaline Phosphatase: 88 U/L (ref 38–126)
Anion gap: 6 (ref 5–15)
BUN: 31 mg/dL — ABNORMAL HIGH (ref 8–23)
CO2: 23 mmol/L (ref 22–32)
Calcium: 7.8 mg/dL — ABNORMAL LOW (ref 8.9–10.3)
Chloride: 113 mmol/L — ABNORMAL HIGH (ref 98–111)
Creatinine, Ser: 1.8 mg/dL — ABNORMAL HIGH (ref 0.61–1.24)
GFR, Estimated: 42 mL/min — ABNORMAL LOW (ref >60)
Glucose, Bld: 93 mg/dL (ref 70–99)
Potassium: 4.8 mmol/L (ref 3.5–5.1)
Sodium: 142 mmol/L (ref 135–145)
Total Bilirubin: 0.6 mg/dL (ref <1.2)
Total Protein: 4.6 g/dL — ABNORMAL LOW (ref 6.5–8.1)

## 2023-09-08 LAB — VITAMIN B12: Vitamin B-12: 2255 pg/mL — ABNORMAL HIGH (ref 180–914)

## 2023-09-08 LAB — FERRITIN: Ferritin: 316 ng/mL (ref 24–336)

## 2023-09-08 LAB — MAGNESIUM: Magnesium: 1.6 mg/dL — ABNORMAL LOW (ref 1.7–2.4)

## 2023-09-10 LAB — TACROLIMUS LEVEL: Tacrolimus (FK506) - LabCorp: 5 ng/mL (ref 2.0–20.0)

## 2023-09-13 LAB — METHYLMALONIC ACID, SERUM: Methylmalonic Acid, Quantitative: 197 nmol/L (ref 0–378)

## 2023-09-14 NOTE — Progress Notes (Unsigned)
[facility] 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  He was last seen by Rojelio Brenner PA-C on 06/23/2023.  Since his last visit, he was hospitalized from 08/18/2023 to 08/20/2023 due to severe hyperkalemia (potassium 6.7).  At today's visit, he reports feeling ***.  He is tolerating Retacrit well.  *** ***He reports increased blood pressure at home - usually around 140/60, but recently increased SBP around 160, which he attributes to back and leg pain.  *** ***Blood pressure in clinic today is 163/68***.  He denies any current symptoms concerning for DVT or PE.  His energy continues to improve following liver transplant.*** ***  He continues to bruise easily, but notes that he has been on aspirin s/p transplant.   ***He denies any obvious bleeding such as epistaxis, hematochezia, melena, or hematuria.   ***He denies any new lumps or bumps.  *** He has not had any recent infections.  *** He denies any B symptoms.  *** He is taking his Vitamin B12 500 mcg daily.  He has 75***% energy and 100***% appetite. He is starting to regain some weight after his transplant surgery.  ASSESSMENT & PLAN:  1.  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 - CT scan (11/14/2021 at [facility]) showed enlarged spleen measuring 17 cm - Work-up negative for other causes of thrombocytopenia - normal B12, methylmalonic acid, folate, copper; negative rheumatoid factor/ANA; no abnormal platelets noted on pathology smear review - Received platelet transfusion x1 on 08/18/2021 prior to prostate biopsy.  Blood transfusion x1 on 01/15/2022 prior to prostate SpaceOAR placement is - Bone marrow biopsy (02/10/2022) shows hypercellular marrow for age with trilineage lineage hematopoiesis.  No increase in blasts.  No evidence of metastatic carcinoma. - Liver transplant at [facility] on 01/16/2023 - Most recent labs (09/08/2023): Platelets 114, WBC 3.8/ALC 0.6. - DIFFERENTIAL DIAGNOSIS: Thrombocytopenia and leukopenia likely secondary to cirrhosis (s/p liver transplant 01/16/2023) and splenic sequestration.  Unclear if patient has any underlying ITP in addition to this. Discussed with patient that spleen size may not fully returned to normal after liver transplantation, and therefore platelet count may or may not fully return to normal - PLAN: Platelets are improved following liver transplantation.  No indication for treatment at this time.  We will continue to monitor with periodic CBCs. - Patient's Myfortic dose was recently decreased in half (as of 05/31/2023) by transplant team due to neutropenia.   2.  Normocytic anemia, secondary to CKD + iron deficiency  - Bone marrow biopsy (02/10/2022) shows hypercellular  marrow for age with trilineage lineage hematopoiesis.  No increase in blasts.  No evidence of metastatic carcinoma. - EGDs at [facility] in April 2023 in October 2023 showed esophageal varices (s/p  banding), old blood in the stomach, and portal hypertensive gastropathy  - Other work-up (08/18/2021) showed normal reticulocytes; normal B12, methylmalonic acid, folate, and copper.  Immunofixation and SPEP negative.  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 Jena Gauss - He received IV Venofer x1000 mg, last dose given 09/03/2021 - Reports PRBC transfusion x2 during hospitalization at [facility] in June/July 2023 - Retacrit 10,000 units every 2 weeks started 10/22/2022 (interrupted due to liver transplant 01/16/2023).  Retacrit restarted on 04/14/2023.  Current dose is 10,000 units every 4 weeks. - Most recent labs (09/08/2023):  CBC: Hgb 10.6/MCV 102.1 Vitamin B12 elevated at 2255, MMA normal Creatinine 1.80/GFR 42 (CKD stage IIIb) Ferritin 316, iron saturation 62% - He denies any gross blood loss such as epistaxis, rectal bleeding, melena, or hematuria. - Energy levels are improved.  No pica, chest pain, or difficulty breathing. - DIFFERENTIAL DIAGNOSIS: Anemia secondary to CKD, further complicated by malabsorption (celiac disease), chronic GI bleeding - PLAN: No indication for IV iron at this time. - Continue Retacrit 10,000 units every 4 weeks. - Patient can STOP vitamin B12 supplement at this time. - Labs (CBC/D, CMP, ferritin, iron/TIBC, B12, MMA) in 3 months with OFFICE visit 1 week after labs  3.  Cirrhosis with esophageal varices - S/P LIVER TRANSPLANT 01/16/2023 - Diagnosed in 2017 - Patient had EGD/colonoscopy on 07/31/2021 which showed portal colopathy as well as gastric erosions and grade 3 varices, which were not banded due to low platelet count - EGD (12/26/2021 via [facility]): Grade 2 esophageal varices, banded x3; old blood noted in stomach; mild portal hypertensive gastropathy - Patient follows with hepatology at [facility] - Liver transplant at [facility] on 01/16/2023 NOTE: Patient's  post-transplant nurse coordinator at [facility] is [facility]  [phone])    4.  Stage T1c adenocarcinoma of the prostate, Gleason 3+4   - Diagnosed in October 2022 with prostate biopsy after abnormal PSA (8.2) - Following with urologist (Dr. Mena Goes / Dr. Ronne Binning) and has also been seen by radiation oncology (Ashlyn Bruning PA-C / Dr. Margaretmary Dys) - He had SpaceOAR placement on 01/15/2022 - Patient is undergoing XRT in [place], which was completed on 03/13/2022. - He is receiving Lupron shots and following with urology and radiation oncology  PLAN SUMMARY: >> CBC + Retacrit every 4 weeks >> Labs in 3 months = CBC/D, CMP, ferritin, iron/TIBC, B12, MMA >> OFFICE visit in 3 months (1 week after lab panel) ***    REVIEW OF SYSTEMS: ***  Review of Systems  Constitutional:  Negative for appetite change, chills, diaphoresis, fatigue, fever and unexpected weight change.  HENT:   Negative for lump/mass and nosebleeds.   Eyes:  Negative for eye problems.  Respiratory:  Negative for cough, hemoptysis and shortness of breath.   Cardiovascular:  Positive for leg swelling. Negative for chest pain and palpitations.  Gastrointestinal:  Positive for diarrhea. Negative for abdominal pain, blood in stool, constipation, nausea and vomiting.  Genitourinary:  Positive for frequency (Incomplete bladder emptying). Negative for hematuria.   Skin: Negative.   Neurological:  Negative for dizziness, headaches and light-headedness.  Hematological:  Does not bruise/bleed easily.  Psychiatric/Behavioral:  Positive for sleep disturbance.      PHYSICAL EXAM:  ECOG PERFORMANCE STATUS: 1 - Symptomatic but completely ambulatory *** There were no vitals filed for this visit. There were no vitals filed for this visit. Physical Exam Constitutional:      Appearance: Normal appearance. He is obese.  Cardiovascular:     Heart sounds: Normal heart sounds.  Pulmonary:     Breath sounds: Normal breath sounds.   Musculoskeletal:     Right lower leg: Edema (ankle edema) present.     Left lower leg: Edema (ankle edema) present.  Neurological:     General: No focal deficit present.     Mental Status: Mental status is at baseline.  Psychiatric:        Behavior: Behavior normal. Behavior is cooperative.    PAST MEDICAL/SURGICAL HISTORY:  Past Medical History:  Diagnosis Date   Allergy    nose and sinus problems   Anemia    Anemia in chronic kidney disease (CKD) 11/23/2022   Asthma    Celiac disease    Cirrhosis, cryptogenic (HCC)    completed Hep A and B vaccines   DM (diabetes mellitus) (HCC)    Encounter for general adult medical examination with abnormal findings 09/08/2022   GERD (gastroesophageal reflux disease)    Hyperlipidemia    diet controlled now with 100 pound weight loss   Hypertension    Iron deficiency anemia due to chronic blood loss 08/13/2021   Malignant neoplasm of prostate (HCC) 10/03/2021   Murmur, heart 03/02/2022   Narrow angle glaucoma suspect of both eyes    Renal cyst 09/03/2017   right   Situational depression 07/06/2017   Splenomegaly    Thrombocytopenia (HCC)    hematology, ?related to chol med (tricor) and glimeripide, just being monitored, above 100,000   Past Surgical History:  Procedure Laterality Date   BIOPSY  07/09/2016   Procedure: BIOPSY;  Surgeon: Corbin Ade, MD;  [facility];  Service: Endoscopy;;  duodenal, gastric, esophaageal   BIOPSY  07/31/2021   Procedure: BIOPSY;  Surgeon: Corbin Ade, MD;  [facility];  Service: Endoscopy;;   CLOSED MANIPULATION SHOULDER     COLONOSCOPY  11/2015   Dr. Allena Katz: cecal bx neg for microscopic colitis. ascending colon polyp (adenomatous)   COLONOSCOPY WITH PROPOFOL N/A 07/31/2021   Procedure: COLONOSCOPY WITH PROPOFOL;  Surgeon: Corbin Ade, MD;  [facility];  Service: Endoscopy;  Laterality: N/A;  7:30am   ESOPHAGEAL BANDING N/A 08/04/2017   Procedure:  ESOPHAGEAL BANDING;  Surgeon: Corbin Ade, MD;  [facility];  Service: Endoscopy;  Laterality: N/A;  esophageal varices banding   ESOPHAGEAL BANDING N/A 10/13/2017   Procedure: ESOPHAGEAL BANDING;  Surgeon: Corbin Ade, MD;  [facility];  Service: Endoscopy;  Laterality: N/A;   ESOPHAGEAL BANDING N/A 04/09/2020   Procedure: ESOPHAGEAL BANDING;  Surgeon: Corbin Ade, MD;  [facility];  Service: Endoscopy;  Laterality: N/A;   ESOPHAGOGASTRODUODENOSCOPY N/A 07/09/2016   Procedure: ESOPHAGOGASTRODUODENOSCOPY (EGD);  Surgeon: Corbin Ade, MD;  [facility];  Service: Endoscopy;  Laterality: N/A;  730   ESOPHAGOGASTRODUODENOSCOPY N/A 08/04/2017   Procedure: ESOPHAGOGASTRODUODENOSCOPY (EGD);  Surgeon: Corbin Ade, MD;  [facility];  Service: Endoscopy;  Laterality: N/A;  7:30am   ESOPHAGOGASTRODUODENOSCOPY N/A 10/13/2017   Dr. Jena Gauss: grade 2 esophageal varices status post banding, portal hypertensive gastropathy   ESOPHAGOGASTRODUODENOSCOPY  11/23/2019   [name]: Esophageal varices, 4 columns of grade 2-3 status post banding.  Varices more prominent than seen in December 2018.  Portal gastropathy.  Normal-appearing small bowel.   ESOPHAGOGASTRODUODENOSCOPY (EGD) WITH PROPOFOL N/A 04/09/2020   Dr. Jena Gauss: 4 columns of grade 2/grade 3 esophageal varices somewhat recalcitrant.  Status post esophageal band ligation today.  Portal gastropathy.  Normal-appearing small bowel.   ESOPHAGOGASTRODUODENOSCOPY (EGD) WITH PROPOFOL N/A 07/31/2021   Procedure: ESOPHAGOGASTRODUODENOSCOPY (EGD) WITH PROPOFOL;  Surgeon: Corbin Ade, MD;  [facility];  Service: Endoscopy;  Laterality: N/A;   GOLD SEED IMPLANT N/A 01/15/2022   Procedure: GOLD SEED IMPLANT;  Surgeon: Malen Gauze, MD;  Location: [facility];  Service: Urology;  Laterality: N/A;   LIVER TRANSPLANT  01/16/2023   SPACE OAR INSTILLATION N/A 01/15/2022   Procedure: SPACE OAR  INSTILLATION;  Surgeon: Malen Gauze, MD;  Location: [facility];  Service: Urology;  Laterality: N/A;   US PARACENTESIS  06/10/2022    SOCIAL HISTORY:  Social History   Socioeconomic History   Marital status: Married    Spouse name: Michael Harmon   Number of children: 0   Years of education: 14   Highest education level: Not on file  Occupational History   Occupation: Lexicographer, 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   Has no children.   Eats all food groups.   Works for [employer]       Social Determinants of Health   Financial Resource Strain: Low Risk  (08/24/2023)   Received from [facility]   Overall Financial Resource Strain (CARDIA)    Difficulty of Paying Living Expenses: Not hard at all  Food Insecurity: No Food Insecurity (08/24/2023)   Received from [facility]   Hunger Vital Sign    Worried About Running Out of Food in the Last Year: Never true    Ran Out of Food in the Last Year: Never true  Transportation Needs: No Transportation Needs (08/24/2023)   Received from [facility] - Transportation    In the past 12 months, has lack of transportation kept you from medical appointments or from getting medications?: No    Lack of Transportation (Non-Medical): No  Physical Activity: Not on file  Stress: Not on file  Social Connections: Not on file  Intimate Partner Violence: Not At Risk (08/18/2023)   Humiliation, Afraid, Rape, and Kick questionnaire    Fear of Current or Ex-Partner: No    Emotionally Abused: No    Physically Abused: No    Sexually  Abused: No    FAMILY HISTORY:  Family History  Problem Relation Age of Onset   Other Mother        chronic diarrhea   COPD Mother    Heart disease Mother        died at 75   Arthritis Mother    Cancer Mother    Depression Mother    Diabetes Mother    Hyperlipidemia Mother    Hypertension Mother    Stroke Mother    Arthritis Father    Asthma Father    Birth defects Father  Heart disease Father        aortic valve replaced   Heart disease Maternal Grandmother    Stroke Maternal Grandmother    Heart disease Maternal Grandfather    Stroke Maternal Grandfather    Diabetes Paternal Grandmother    Stroke Paternal Grandfather    Heart disease Paternal Grandfather    Liver disease Neg Hx    Colon cancer Neg Hx     CURRENT MEDICATIONS:  Outpatient Encounter Medications as of 09/15/2023  Medication Sig   aspirin EC 81 MG tablet Take by mouth.   carvedilol (COREG) 6.25 MG tablet Take 6.25 mg by mouth 2 (two) times daily with a meal.   Continuous Glucose Sensor (FREESTYLE LIBRE 2 SENSOR) MISC See admin instructions.   cyanocobalamin (VITAMIN B12) 500 MCG tablet Take 1 tablet (500 mcg total) by mouth daily.   fludrocortisone (FLORINEF) 0.1 MG tablet Take by mouth.   Multiple Vitamins-Minerals (CITRACAL +D3) TABS Take by mouth.   mycophenolate (MYFORTIC) 180 MG EC tablet Take 180 mg by mouth 2 (two) times daily.   pantoprazole (PROTONIX) 40 MG tablet Take 40 mg by mouth 2 (two) times daily.   tacrolimus (PROGRAF) 1 MG capsule Take 2-3 mg by mouth 2 (two) times daily. Taking 2 mg in the morning and 3 mg at night   tamsulosin (FLOMAX) 0.4 MG CAPS capsule Take 0.4 mg by mouth daily.   torsemide (DEMADEX) 10 MG tablet Take 1 tablet by mouth daily.   No facility-administered encounter medications on file as of 09/15/2023.    ALLERGIES:  Allergies  Allergen Reactions   Penicillins Other (See Comments)    As a baby, unknown reaction Did it involve swelling of the  face/tongue/throat, SOB, or low BP? Unknown Did it involve sudden or severe rash/hives, skin peeling, or any reaction on the inside of your mouth or nose? Unknown Did you need to seek medical attention at a hospital or doctor's office? Unknown When did it last happen?      infant allergy If all above answers are "NO", may proceed with cephalosporin use. .    Gluten Meal Diarrhea   Biaxin [Clarithromycin] Tinitus    Ears ringing, loss sense of smell    LABORATORY DATA:  I have reviewed the labs as listed.  CBC    Component Value Date/Time   WBC 3.8 (L) 09/08/2023 0841   RBC 3.28 (L) 09/08/2023 0841   HGB 10.6 (L) 09/08/2023 0841   HGB 11.1 (L) 09/02/2021 1655   HCT 33.5 (L) 09/08/2023 0841   HCT 32.9 (L) 09/02/2021 1655   PLT 114 (L) 09/08/2023 0841   PLT 43 (LL) 09/02/2021 1655   MCV 102.1 (H) 09/08/2023 0841   MCV 96 09/02/2021 1655   MCH 32.3 09/08/2023 0841   MCHC 31.6 09/08/2023 0841   RDW 13.9 09/08/2023 0841   RDW 14.0 09/02/2021 1655   LYMPHSABS 0.6 (L) 09/08/2023 0841   LYMPHSABS 1.2 09/02/2021 1655   MONOABS 0.6 09/08/2023 0841   EOSABS 0.3 09/08/2023 0841   EOSABS 0.3 09/02/2021 1655   BASOSABS 0.0 09/08/2023 0841   BASOSABS 0.0 09/02/2021 1655      Latest Ref Rng & Units 09/08/2023    8:41 AM 08/26/2023    9:14 AM 08/20/2023    4:55 AM  CMP  Glucose 70 - 99 mg/dL 93  324  70   BUN 8 - 23 mg/dL 31  42  31   Creatinine 0.61 - 1.24 mg/dL 4.01  0.27  1.78   Sodium 135 - 145 mmol/L 142  139  136   Potassium 3.5 - 5.1 mmol/L 4.8  4.6  5.0   Chloride 98 - 111 mmol/L 113  110  111   CO2 22 - 32 mmol/L 23  21  21    Calcium 8.9 - 10.3 mg/dL 7.8  7.7  7.7   Total Protein 6.5 - 8.1 g/dL 4.6  5.1    Total Bilirubin <1.2 mg/dL 0.6  0.3    Alkaline Phos 38 - 126 U/L 88  95    AST 15 - 41 U/L 27  19    ALT 0 - 44 U/L 34  20      DIAGNOSTIC IMAGING:  I have independently reviewed the relevant imaging and discussed with the patient.   WRAP UP:  All questions  were answered. The patient knows to call the clinic with any problems, questions or concerns.  Medical decision making: Moderate  Time spent on visit: I spent 20 minutes counseling the patient face to face. The total time spent in the appointment was 30 minutes and more than 50% was on counseling.  [name], PA-C  ***

## 2023-09-15 ENCOUNTER — Inpatient Hospital Stay: Payer: BC Managed Care – PPO

## 2023-09-15 ENCOUNTER — Inpatient Hospital Stay (HOSPITAL_BASED_OUTPATIENT_CLINIC_OR_DEPARTMENT_OTHER): Payer: BC Managed Care – PPO | Admitting: Physician Assistant

## 2023-09-15 VITALS — BP 135/54 | HR 58 | Temp 97.5°F | Resp 18 | Wt 187.0 lb

## 2023-09-15 DIAGNOSIS — E538 Deficiency of other specified B group vitamins: Secondary | ICD-10-CM

## 2023-09-15 DIAGNOSIS — D631 Anemia in chronic kidney disease: Secondary | ICD-10-CM

## 2023-09-15 DIAGNOSIS — D696 Thrombocytopenia, unspecified: Secondary | ICD-10-CM

## 2023-09-15 DIAGNOSIS — N1832 Chronic kidney disease, stage 3b: Secondary | ICD-10-CM

## 2023-09-15 DIAGNOSIS — D61818 Other pancytopenia: Secondary | ICD-10-CM | POA: Diagnosis not present

## 2023-09-15 LAB — CBC
HCT: 33.4 % — ABNORMAL LOW (ref 39.0–52.0)
Hemoglobin: 10.1 g/dL — ABNORMAL LOW (ref 13.0–17.0)
MCH: 31.1 pg (ref 26.0–34.0)
MCHC: 30.2 g/dL (ref 30.0–36.0)
MCV: 102.8 fL — ABNORMAL HIGH (ref 80.0–100.0)
Platelets: 107 10*3/uL — ABNORMAL LOW (ref 150–400)
RBC: 3.25 MIL/uL — ABNORMAL LOW (ref 4.22–5.81)
RDW: 13.6 % (ref 11.5–15.5)
WBC: 4.3 10*3/uL (ref 4.0–10.5)
nRBC: 0 % (ref 0.0–0.2)

## 2023-09-15 MED ORDER — EPOETIN ALFA-EPBX 10000 UNIT/ML IJ SOLN
10000.0000 [IU] | Freq: Once | INTRAMUSCULAR | Status: AC
  Administered 2023-09-15: 10000 [IU] via SUBCUTANEOUS
  Filled 2023-09-15: qty 1

## 2023-09-15 NOTE — Progress Notes (Signed)
Patient presents today for Retacrit injection and follow up with R. Pennington PA. HGB 10.6 on 09/08/2023. Blood pressure within parameters for treatment.   New CBC collected per R. Pennington PA request. HGB today 10.1.   Michael Harmon presents today for injection per the provider's orders.  Retacrit administration without incident; injection site WNL; see MAR for injection details.  Patient tolerated procedure well and without incident.  No complaints at this time. Discharged from clinic ambulatory in stable condition. Alert and oriented x 3. F/U with [facility] as scheduled.

## 2023-09-15 NOTE — Patient Instructions (Signed)
[facility] **VISIT SUMMARY & IMPORTANT INSTRUCTIONS **   You were seen today by Rojelio Brenner PA-C for your follow-up visit.   Continue Retacrit injections every 4 weeks Will continue to monitor your platelets and white blood cells. You can STOP taking your vitamin B12  FOLLOW-UP APPOINTMENT: 3 months  ** Thank you for trusting me with your healthcare!  I strive to provide all of my patients with quality care at each visit.  If you receive a survey for this visit, I would be so grateful to you for taking the time to provide feedback.  Thank you in advance!  ~ Laythan Hayter                   Dr. Doreatha Massed   &   Rojelio Brenner, PA-C   - - - - - - - - - - - - - - - - - -    Thank you for choosing [facility] at [facility] to provide your oncology and hematology care.  To afford each patient quality time with our provider, please arrive at least 15 minutes before your scheduled appointment time.   If you have a lab appointment with the Cancer Center please come in thru the Main Entrance and check in at the main information desk.  You need to re-schedule your appointment should you arrive 10 or more minutes late.  We strive to give you quality time with our providers, and arriving late affects you and other patients whose appointments are after yours.  Also, if you no show three or more times for appointments you may be dismissed from the clinic at the providers discretion.     Again, thank you for choosing [facility].  Our hope is that these requests will decrease the amount of time that you wait before being seen by our physicians.       _____________________________________________________________  Should you have questions after your visit to [facility], please contact our office at [phone] and follow the prompts.  Our office hours are 8:00 a.m. and 4:30 p.m. Monday - Friday.  Please  note that voicemails left after 4:00 p.m. may not be returned until the following business day.  We are closed weekends and major holidays.  You do have access to a nurse 24-7, just call the main number to the clinic [phone] and do not press any options, hold on the line and a nurse will answer the phone.    For prescription refill requests, have your pharmacy contact our office and allow 72 hours.

## 2023-09-15 NOTE — Patient Instructions (Signed)
[facility] - A DEPT OF [facility]  Discharge Instructions: Thank you for choosing [facility] to provide your oncology and hematology care.  If you have a lab appointment with the Cancer Center - please note that after April 8th, 2024, all labs will be drawn in the cancer center.  You do not have to check in or register with the main entrance as you have in the past but will complete your check-in in the cancer center.  Wear comfortable clothing and clothing appropriate for easy access to any Portacath or PICC line.   We strive to give you quality time with your provider. You may need to reschedule your appointment if you arrive late (15 or more minutes).  Arriving late affects you and other patients whose appointments are after yours.  Also, if you miss three or more appointments without notifying the office, you may be dismissed from the clinic at the provider's discretion.      For prescription refill requests, have your pharmacy contact our office and allow 72 hours for refills to be completed.    Today you received the following chemotherapy and/or immunotherapy agents Retacrit. Epoetin Alfa Injection What is this medication? EPOETIN ALFA (e POE e tin AL fa) treats low levels of red blood cells (anemia) caused by kidney disease, chemotherapy, or HIV medications. It can also be used in people who are at risk for blood loss during surgery. It works by Systems analyst make more red blood cells, which reduces the need for blood transfusions. This medicine may be used for other purposes; ask your health care provider or pharmacist if you have questions. COMMON BRAND NAME(S): Epogen, Procrit, Retacrit What should I tell my care team before I take this medication? They need to know if you have any of these conditions: Blood clots Cancer Heart disease High blood pressure On dialysis Seizures Stroke An unusual or allergic reaction to epoetin  alfa, albumin, benzyl alcohol, other medications, foods, dyes, or preservatives Pregnant or trying to get pregnant Breast-feeding How should I use this medication? This medication is injected into a vein or under the skin. It is usually given by your care team in a hospital or clinic setting. It may also be given at home. If you get this medication at home, you will be taught how to prepare and give it. Use exactly as directed. Take it as directed on the prescription label at the same time every day. Keep taking it unless your care team tells you to stop. It is important that you put your used needles and syringes in a special sharps container. Do not put them in a trash can. If you do not have a sharps container, call your pharmacist or care team to get one. A special MedGuide will be given to you by the pharmacist with each prescription and refill. Be sure to read this information carefully each time. Talk to your care team about the use of this medication in children. While this medication may be used in children as young as 1 month of age for selected conditions, precautions do apply. Overdosage: If you think you have taken too much of this medicine contact a poison control center or emergency room at once. NOTE: This medicine is only for you. Do not share this medicine with others. What if I miss a dose? If you miss a dose, take it as soon as you can. If it is almost time for your  next dose, take only that dose. Do not take double or extra doses. What may interact with this medication? Darbepoetin alfa Methoxy polyethylene glycol-epoetin beta This list may not describe all possible interactions. Give your health care provider a list of all the medicines, herbs, non-prescription drugs, or dietary supplements you use. Also tell them if you smoke, drink alcohol, or use illegal drugs. Some items may interact with your medicine. What should I watch for while using this medication? Visit your care  team for regular checks on your progress. Check your blood pressure as directed. Know what your blood pressure should be and when to contact your care team. Your condition will be monitored carefully while you are receiving this medication. You may need blood work while taking this medication. What side effects may I notice from receiving this medication? Side effects that you should report to your care team as soon as possible: Allergic reactions--skin rash, itching, hives, swelling of the face, lips, tongue, or throat Blood clot--pain, swelling, or warmth in the leg, shortness of breath, chest pain Heart attack--pain or tightness in the chest, shoulders, arms, or jaw, nausea, shortness of breath, cold or clammy skin, feeling faint or lightheaded Increase in blood pressure Rash, fever, and swollen lymph nodes Redness, blistering, peeling, or loosening of the skin, including inside the mouth Seizures Stroke--sudden numbness or weakness of the face, arm, or leg, trouble speaking, confusion, trouble walking, loss of balance or coordination, dizziness, severe headache, change in vision Side effects that usually do not require medical attention (report to your care team if they continue or are bothersome): Bone, joint, or muscle pain Cough Headache Nausea Pain, redness, or irritation at injection site This list may not describe all possible side effects. Call your doctor for medical advice about side effects. You may report side effects to FDA at 1-800-FDA-1088. Where should I keep my medication? Keep out of the reach of children and pets. Store in a refrigerator. Do not freeze. Do not shake. Protect from light. Keep this medication in the original container until you are ready to take it. See product for storage information. Get rid of any unused medication after the expiration date. To get rid of medications that are no longer needed or have expired: Take the medication to a medication take-back  program. Check with your pharmacy or law enforcement to find a location. If you cannot return the medication, ask your pharmacist or care team how to get rid of the medication safely. NOTE: This sheet is a summary. It may not cover all possible information. If you have questions about this medicine, talk to your doctor, pharmacist, or health care provider.  2024 Elsevier/Gold Standard (2022-02-20 00:00:00)       To help prevent nausea and vomiting after your treatment, we encourage you to take your nausea medication as directed.  BELOW ARE SYMPTOMS THAT SHOULD BE REPORTED IMMEDIATELY: *FEVER GREATER THAN 100.4 F (38 C) OR HIGHER *CHILLS OR SWEATING *NAUSEA AND VOMITING THAT IS NOT CONTROLLED WITH YOUR NAUSEA MEDICATION *UNUSUAL SHORTNESS OF BREATH *UNUSUAL BRUISING OR BLEEDING *URINARY PROBLEMS (pain or burning when urinating, or frequent urination) *BOWEL PROBLEMS (unusual diarrhea, constipation, pain near the anus) TENDERNESS IN MOUTH AND THROAT WITH OR WITHOUT PRESENCE OF ULCERS (sore throat, sores in mouth, or a toothache) UNUSUAL RASH, SWELLING OR PAIN  UNUSUAL VAGINAL DISCHARGE OR ITCHING   Items with * indicate a potential emergency and should be followed up as soon as possible or go to the Emergency Department if  any problems should occur.  Please show the CHEMOTHERAPY ALERT CARD or IMMUNOTHERAPY ALERT CARD at check-in to the Emergency Department and triage nurse.  Should you have questions after your visit or need to cancel or reschedule your appointment, please contact [facility] - A DEPT OF [facility] [phone]  and follow the prompts.  Office hours are 8:00 a.m. to 4:30 p.m. Monday - Friday. Please note that voicemails left after 4:00 p.m. may not be returned until the following business day.  We are closed weekends and major holidays. You have access to a nurse at all times for urgent questions. Please call the main number to the clinic  [phone] and follow the prompts.  For any non-urgent questions, you may also contact your provider using MyChart. We now offer e-Visits for anyone 34 and older to request care online for non-urgent symptoms. For details visit [email or web address].   Also download the MyChart app! Go to the app store, search "MyChart", open the app, select , and log in with your MyChart username and password.

## 2023-09-17 ENCOUNTER — Encounter: Payer: Self-pay | Admitting: Internal Medicine

## 2023-09-17 ENCOUNTER — Ambulatory Visit (INDEPENDENT_AMBULATORY_CARE_PROVIDER_SITE_OTHER): Payer: BC Managed Care – PPO | Admitting: Internal Medicine

## 2023-09-17 VITALS — BP 120/74 | HR 74 | Ht 67.0 in | Wt 187.2 lb

## 2023-09-17 DIAGNOSIS — N1832 Chronic kidney disease, stage 3b: Secondary | ICD-10-CM

## 2023-09-17 DIAGNOSIS — Z5309 Procedure and treatment not carried out because of other contraindication: Secondary | ICD-10-CM

## 2023-09-17 DIAGNOSIS — E8809 Other disorders of plasma-protein metabolism, not elsewhere classified: Secondary | ICD-10-CM | POA: Insufficient documentation

## 2023-09-17 DIAGNOSIS — C61 Malignant neoplasm of prostate: Secondary | ICD-10-CM

## 2023-09-17 DIAGNOSIS — I8500 Esophageal varices without bleeding: Secondary | ICD-10-CM

## 2023-09-17 DIAGNOSIS — E1169 Type 2 diabetes mellitus with other specified complication: Secondary | ICD-10-CM

## 2023-09-17 DIAGNOSIS — Z0001 Encounter for general adult medical examination with abnormal findings: Secondary | ICD-10-CM | POA: Diagnosis not present

## 2023-09-17 DIAGNOSIS — Z794 Long term (current) use of insulin: Secondary | ICD-10-CM

## 2023-09-17 DIAGNOSIS — Z944 Liver transplant status: Secondary | ICD-10-CM

## 2023-09-17 NOTE — Assessment & Plan Note (Signed)
Has recent worsening of leg swelling due to hypoalbuminemia, history of liver cirrhosis Unable to take regular protein supplement due to hypotension content-recently had hospitalization for hyperkalemia Has been advised to take Nepro supplement

## 2023-09-17 NOTE — Assessment & Plan Note (Signed)
HbA1c: 4.2 today  Associated with HLD Was on NovoLog ISS, followed by Endocrinology in [facility] endocrinology - has not required insulin since liver transplant Advised to follow diabetic diet Not on statin due to liver cirrhosis Recent CMP reviewed from chart Diabetic eye exam: Advised to follow up with Ophthalmology for diabetic eye exam

## 2023-09-17 NOTE — Assessment & Plan Note (Signed)
Completed radiotherapy for prostate cancer.  He is followed by oncology and urology currently.  He denies any hematuria, dysuria or urinary hesitancy or resistance currently.

## 2023-09-17 NOTE — Assessment & Plan Note (Addendum)
Last CMP showed GFR of 42 Followed by Nephrology at [facility] Avoid nephrotoxic agents

## 2023-09-17 NOTE — Assessment & Plan Note (Signed)
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

## 2023-09-17 NOTE — Patient Instructions (Addendum)
Please continue to take medications as prescribed.  Please continue to follow low carb diet and perform moderate exercise/walking as tolerated.

## 2023-09-17 NOTE — Assessment & Plan Note (Signed)
Physical exam as documented. Fasting blood tests ordered. 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

## 2023-09-17 NOTE — Assessment & Plan Note (Signed)
Noted on last EGD at [facility] Followed by GI - has been placed on Coreg Gets surveillance EGD at [facility] health

## 2023-09-17 NOTE — Assessment & Plan Note (Addendum)
Due to h/o liver cirrhosis

## 2023-09-17 NOTE — Progress Notes (Signed)
Established Patient Office Visit  Subjective:  Patient ID: Michael Harmon, male    DOB: December 31, 1960  Age: 62 y.o. MRN: [id number]  CC:  Chief Complaint  Patient presents with   Annual Exam   Leg Swelling    Lower extremity swelling     HPI Michael Harmon is a 62 y.o. male with past medical history of HTN, hepatic cirrhosis, portal hypertension, esophageal varices, celiac disease, type II DM, HLD, thrombocytopenia and IDA who presents for annual physical.  He was recently admitted for hyperkalemia.  He had hypoglycemia due to insulin given for hyperkalemia.  He later went to [facility] and was given torsemide 10 mg upon discharge.  He was also given fludrocortisone for hypotension.  He had worsening of leg swelling since being discharged on 08/24/23.  He was advised to increase dose of torsemide to 20 mg and stop fludrocortisone from today by transplant clinic.  Celiac disease, hepatic cirrhosis: He had liver transplant on 01/16/2023.  He is on tacrolimus, CellCept and prednisone currently.  Followed by [facility] transplant team and nephrology.  He is followed by [facility] and [facility] liver clinic.  He has history of esophageal varices as well.  He used to get surveillance EGD locally, but has to get it at [facility] now due to his thrombocytopenia. EGD has shown esophageal varices in the past, for which he needs repeat surveillance EGD. He denies any melena or hematochezia currently. He has chronic leg swelling in the setting of hypoalbuminemia.   He has completed radiotherapy for prostate cancer.  He is followed by oncology and urology currently.  He denies any hematuria, dysuria or urinary hesitancy or resistance currently.   Type 2 DM: His HbA1c was 4.2 today. He has not needed insulin since liver transplant. He did not tolerate metformin in the past, had diarrhea with that.  He attributes his celiac disease symptom onset to metformin.  Denies any polyuria or polydipsia currently.  He used to  take Lipitor for HLD, but was stopped due to hepatic cirrhosis.    Past Medical History:  Diagnosis Date   Allergy    nose and sinus problems   Anemia    Anemia in chronic kidney disease (CKD) 11/23/2022   Asthma    Celiac disease    Cirrhosis, cryptogenic (HCC)    completed Hep A and B vaccines   DM (diabetes mellitus) (HCC)    Encounter for general adult medical examination with abnormal findings 09/08/2022   GERD (gastroesophageal reflux disease)    Hyperlipidemia    diet controlled now with 100 pound weight loss   Hypertension    Iron deficiency anemia due to chronic blood loss 08/13/2021   Malignant neoplasm of prostate (HCC) 10/03/2021   Murmur, heart 03/02/2022   Narrow angle glaucoma suspect of both eyes    Renal cyst 09/03/2017   right   Situational depression 07/06/2017   Splenomegaly    Thrombocytopenia (HCC)    hematology, ?related to chol med (tricor) and glimeripide, just being monitored, above 100,000    Past Surgical History:  Procedure Laterality Date   BIOPSY  07/09/2016   Procedure: BIOPSY;  Surgeon: Corbin Ade, MD;  [facility];  Service: Endoscopy;;  duodenal, gastric, esophaageal   BIOPSY  07/31/2021   Procedure: BIOPSY;  Surgeon: Corbin Ade, MD;  [facility];  Service: Endoscopy;;   CLOSED MANIPULATION SHOULDER     COLONOSCOPY  11/2015   Dr. Allena Katz: cecal bx  neg for microscopic colitis. ascending colon polyp (adenomatous)   COLONOSCOPY WITH PROPOFOL N/A 07/31/2021   Procedure: COLONOSCOPY WITH PROPOFOL;  Surgeon: Corbin Ade, MD;  [facility];  Service: Endoscopy;  Laterality: N/A;  7:30am   ESOPHAGEAL BANDING N/A 08/04/2017   Procedure: ESOPHAGEAL BANDING;  Surgeon: Corbin Ade, MD;  [facility];  Service: Endoscopy;  Laterality: N/A;  esophageal varices banding   ESOPHAGEAL BANDING N/A 10/13/2017   Procedure: ESOPHAGEAL BANDING;  Surgeon: Corbin Ade, MD;  [facility];   Service: Endoscopy;  Laterality: N/A;   ESOPHAGEAL BANDING N/A 04/09/2020   Procedure: ESOPHAGEAL BANDING;  Surgeon: Corbin Ade, MD;  [facility];  Service: Endoscopy;  Laterality: N/A;   ESOPHAGOGASTRODUODENOSCOPY N/A 07/09/2016   Procedure: ESOPHAGOGASTRODUODENOSCOPY (EGD);  Surgeon: Corbin Ade, MD;  [facility];  Service: Endoscopy;  Laterality: N/A;  730   ESOPHAGOGASTRODUODENOSCOPY N/A 08/04/2017   Procedure: ESOPHAGOGASTRODUODENOSCOPY (EGD);  Surgeon: Corbin Ade, MD;  [facility];  Service: Endoscopy;  Laterality: N/A;  7:30am   ESOPHAGOGASTRODUODENOSCOPY N/A 10/13/2017   Dr. Jena Gauss: grade 2 esophageal varices status post banding, portal hypertensive gastropathy   ESOPHAGOGASTRODUODENOSCOPY  11/23/2019   [name]: Esophageal varices, 4 columns of grade 2-3 status post banding.  Varices more prominent than seen in December 2018.  Portal gastropathy.  Normal-appearing small bowel.   ESOPHAGOGASTRODUODENOSCOPY (EGD) WITH PROPOFOL N/A 04/09/2020   Dr. Jena Gauss: 4 columns of grade 2/grade 3 esophageal varices somewhat recalcitrant.  Status post esophageal band ligation today.  Portal gastropathy.  Normal-appearing small bowel.   ESOPHAGOGASTRODUODENOSCOPY (EGD) WITH PROPOFOL N/A 07/31/2021   Procedure: ESOPHAGOGASTRODUODENOSCOPY (EGD) WITH PROPOFOL;  Surgeon: Corbin Ade, MD;  [facility];  Service: Endoscopy;  Laterality: N/A;   GOLD SEED IMPLANT N/A 01/15/2022   Procedure: GOLD SEED IMPLANT;  Surgeon: Malen Gauze, MD;  Location: [facility];  Service: Urology;  Laterality: N/A;   LIVER TRANSPLANT  01/16/2023   SPACE OAR INSTILLATION N/A 01/15/2022   Procedure: SPACE OAR INSTILLATION;  Surgeon: Malen Gauze, MD;  Location: [facility];  Service: Urology;  Laterality: N/A;   US PARACENTESIS  06/10/2022    Family History  Problem Relation Age of Onset   Other Mother        chronic diarrhea   COPD Mother    Heart disease Mother         died at 21   Arthritis Mother    Cancer Mother    Depression Mother    Diabetes Mother    Hyperlipidemia Mother    Hypertension Mother    Stroke Mother    Arthritis Father    Asthma Father    Birth defects Father    Heart disease Father        aortic valve replaced   Heart disease Maternal Grandmother    Stroke Maternal Grandmother    Heart disease Maternal Grandfather    Stroke Maternal Grandfather    Diabetes Paternal Grandmother    Stroke Paternal Grandfather    Heart disease Paternal Grandfather    Liver disease Neg Hx    Colon cancer Neg Hx     Social History   Socioeconomic History   Marital status: Married    Spouse name: Melissa   Number of children: 0   Years of education: 14   Highest education level: Not on file  Occupational History   Occupation: Lexicographer, 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   Has no children.   Eats all food groups.   Works for [employer]       Social Determinants of Health   Financial Resource Strain: Low Risk  (08/24/2023)   Received from [facility]   Overall Financial Resource Strain (CARDIA)    Difficulty of Paying Living Expenses: Not hard at all  Food Insecurity: No Food Insecurity (08/24/2023)   Received from [facility]   Hunger Vital Sign    Worried About Running Out of Food in the Last Year: Never true    Ran Out of Food in the Last Year: Never true  Transportation Needs: No Transportation Needs (08/24/2023)   Received from [facility] - Transportation    In the past 12 months, has lack of  transportation kept you from medical appointments or from getting medications?: No    Lack of Transportation (Non-Medical): No  Physical Activity: Not on file  Stress: Not on file  Social Connections: Not on file  Intimate Partner Violence: Not At Risk (08/18/2023)   Humiliation, Afraid, Rape, and Kick questionnaire    Fear of Current or Ex-Partner: No    Emotionally Abused: No    Physically Abused: No    Sexually Abused: No    Outpatient Medications Prior to Visit  Medication Sig Dispense Refill   aspirin EC 81 MG tablet Take by mouth.     carvedilol (COREG) 6.25 MG tablet Take 6.25 mg by mouth 2 (two) times daily with a meal.     Continuous Glucose Sensor (FREESTYLE LIBRE 2 SENSOR) MISC See admin instructions.     cyanocobalamin (VITAMIN B12) 500 MCG tablet Take 1 tablet (500 mcg total) by mouth daily. 90 tablet 3   Multiple Vitamins-Minerals (CITRACAL +D3) TABS Take by mouth.     mycophenolate (MYFORTIC) 180 MG EC tablet Take 180 mg by mouth 2 (two) times daily.     pantoprazole (PROTONIX) 40 MG tablet Take 40 mg by mouth 2 (two) times daily.     tacrolimus (PROGRAF) 1 MG capsule Take 2-3 mg by mouth 2 (two) times daily. Taking 2 mg in the morning and 3 mg at night     tamsulosin (FLOMAX) 0.4 MG CAPS capsule Take 0.4 mg by mouth daily.     torsemide (DEMADEX) 10 MG tablet Take 2 tablets by mouth daily.     fludrocortisone (FLORINEF) 0.1 MG tablet Take by mouth. (Patient not taking: Reported on 09/17/2023)     No facility-administered medications prior to visit.    Allergies  Allergen Reactions   Penicillins Other (See Comments)    As a baby, unknown reaction Did it involve swelling of the face/tongue/throat, SOB, or low BP? Unknown Did it involve sudden or severe rash/hives, skin peeling, or any reaction on the inside of your mouth or nose? Unknown Did you need to seek medical attention at a hospital or doctor's office? Unknown When did it last happen?      infant allergy If  all above answers are "NO", may proceed with  cephalosporin use. .    Gluten Meal Diarrhea   Biaxin [Clarithromycin] Tinitus    Ears ringing, loss sense of smell    ROS Review of Systems  Constitutional:  Positive for fatigue. Negative for chills and fever.  HENT:  Negative for congestion and sore throat.   Eyes:  Negative for pain and discharge.  Respiratory:  Negative for cough and shortness of breath.   Cardiovascular:  Positive for leg swelling. Negative for chest pain and palpitations.  Gastrointestinal:  Positive for diarrhea. Negative for nausea and vomiting.  Endocrine: Negative for polydipsia and polyuria.  Genitourinary:  Negative for dysuria and hematuria.  Musculoskeletal:  Negative for neck pain and neck stiffness.  Skin:  Negative for rash.  Neurological:  Negative for dizziness, weakness, numbness and headaches.  Psychiatric/Behavioral:  Positive for sleep disturbance. Negative for agitation and behavioral problems.       Objective:    Physical Exam Vitals reviewed.  Constitutional:      General: He is not in acute distress.    Appearance: He is not diaphoretic.  HENT:     Head: Normocephalic and atraumatic.     Nose: Nose normal.     Mouth/Throat:     Mouth: Mucous membranes are moist.  Eyes:     General: No scleral icterus.    Extraocular Movements: Extraocular movements intact.  Cardiovascular:     Rate and Rhythm: Normal rate and regular rhythm.     Heart sounds: Normal heart sounds. No murmur heard. Pulmonary:     Breath sounds: Normal breath sounds. No wheezing or rales.  Abdominal:     Palpations: Abdomen is soft.     Tenderness: There is no abdominal tenderness.  Musculoskeletal:     Cervical back: Neck supple. No tenderness.     Right lower leg: Edema (1+) present.     Left lower leg: Edema (2+) present.  Skin:    General: Skin is warm.     Findings: No rash.  Neurological:     General: No focal deficit present.     Mental Status: He is  alert and oriented to person, place, and time.     Sensory: No sensory deficit.     Motor: No weakness.  Psychiatric:        Mood and Affect: Mood normal.        Behavior: Behavior normal.     BP 120/74 (BP Location: Left Arm, Patient Position: Sitting, Cuff Size: Normal)   Pulse 74   Ht 5\' 7"  (1.702 m)   Wt 187 lb 3.2 oz (84.9 kg)   SpO2 96%   BMI 29.32 kg/m  Wt Readings from Last 3 Encounters:  09/17/23 187 lb 3.2 oz (84.9 kg)  09/15/23 187 lb (84.8 kg)  08/31/23 179 lb 6.4 oz (81.4 kg)    Lab Results  Component Value Date   TSH 3.135 10/22/2022   Lab Results  Component Value Date   WBC 4.3 09/15/2023   HGB 10.1 (L) 09/15/2023   HCT 33.4 (L) 09/15/2023   MCV 102.8 (H) 09/15/2023   PLT 107 (L) 09/15/2023   Lab Results  Component Value Date   NA 142 09/08/2023   K 4.8 09/08/2023   CO2 23 09/08/2023   GLUCOSE 93 09/08/2023   BUN 31 (H) 09/08/2023   CREATININE 1.80 (H) 09/08/2023   BILITOT 0.6 09/08/2023   ALKPHOS 88 09/08/2023   AST 27 09/08/2023   ALT 34 09/08/2023   PROT 4.6 (L) 09/08/2023  ALBUMIN 2.5 (L) 09/08/2023   CALCIUM 7.8 (L) 09/08/2023   ANIONGAP 6 09/08/2023   Lab Results  Component Value Date   CHOL 148 09/14/2022   Lab Results  Component Value Date   HDL 52 09/14/2022   Lab Results  Component Value Date   LDLCALC 88 09/14/2022   Lab Results  Component Value Date   TRIG 42 09/14/2022   Lab Results  Component Value Date   CHOLHDL 2.8 09/14/2022   Lab Results  Component Value Date   HGBA1C 4.3 (L) 09/14/2022      Assessment & Plan:   Problem List Items Addressed This Visit       Cardiovascular and Mediastinum   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)    Noted on last EGD at [facility] Followed by GI - has been placed on Coreg Gets surveillance EGD at [facility]        Endocrine   Type 2 diabetes mellitus with other specified complication (HCC) - Primary    HbA1c: 4.2 today  Associated with HLD Was on  NovoLog ISS, followed by Endocrinology in [facility] endocrinology - has not required insulin since liver transplant Advised to follow diabetic diet Not on statin due to liver cirrhosis Recent CMP reviewed from chart Diabetic eye exam: Advised to follow up with Ophthalmology for diabetic eye exam      Relevant Orders   Bayer DCA Hb A1c Waived   Urine Microalbumin w/creat. ratio     Genitourinary   Malignant neoplasm of prostate [facility])    Completed radiotherapy for prostate cancer.  He is followed by oncology and urology currently.  He denies any hematuria, dysuria or urinary hesitancy or resistance currently.      Chronic kidney disease, stage 3b (HCC)    Last CMP showed GFR of 42 Followed by Nephrology at [facility] Avoid nephrotoxic agents        Other   Encounter for general adult medical examination with abnormal findings    Physical exam as documented. Fasting blood tests ordered. 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   Follow-up: Return in about 6 months (around 03/16/2024).    [name], MD

## 2023-09-19 LAB — MICROALBUMIN / CREATININE URINE RATIO
Creatinine, Urine: 33.6 mg/dL
Microalb/Creat Ratio: <9 mg/g{creat} (ref 0–29)
Microalbumin, Urine: <3 ug/mL

## 2023-09-20 LAB — BAYER DCA HB A1C WAIVED: HB A1C (BAYER DCA - WAIVED): 4.2 % — ABNORMAL LOW (ref 4.8–5.6)

## 2023-09-24 ENCOUNTER — Other Ambulatory Visit (HOSPITAL_COMMUNITY)
Admission: RE | Admit: 2023-09-24 | Discharge: 2023-09-24 | Disposition: A | Discharge status: Home / Self Care | Payer: BC Managed Care – PPO | Source: Ambulatory Visit | Attending: Gastroenterology | Admitting: Gastroenterology

## 2023-09-24 ENCOUNTER — Other Ambulatory Visit (HOSPITAL_COMMUNITY)
Admission: RE | Admit: 2023-09-24 | Discharge: 2023-09-24 | Disposition: A | Discharge status: Home / Self Care | Payer: BC Managed Care – PPO | Source: Ambulatory Visit | Attending: Internal Medicine | Admitting: Internal Medicine

## 2023-09-24 DIAGNOSIS — D849 Immunodeficiency, unspecified: Secondary | ICD-10-CM | POA: Insufficient documentation

## 2023-09-24 DIAGNOSIS — Z944 Liver transplant status: Secondary | ICD-10-CM | POA: Insufficient documentation

## 2023-09-24 DIAGNOSIS — R946 Abnormal results of thyroid function studies: Secondary | ICD-10-CM | POA: Insufficient documentation

## 2023-09-24 DIAGNOSIS — B259 Cytomegaloviral disease, unspecified: Secondary | ICD-10-CM | POA: Insufficient documentation

## 2023-09-24 DIAGNOSIS — E119 Type 2 diabetes mellitus without complications: Secondary | ICD-10-CM | POA: Diagnosis not present

## 2023-09-24 LAB — CBC WITH DIFFERENTIAL/PLATELET
Abs Immature Granulocytes: 0.02 10*3/uL (ref 0.00–0.07)
Basophils Absolute: 0 10*3/uL (ref 0.0–0.1)
Basophils Relative: 1 %
Eosinophils Absolute: 0.2 10*3/uL (ref 0.0–0.5)
Eosinophils Relative: 5 %
HCT: 32.9 % — ABNORMAL LOW (ref 39.0–52.0)
Hemoglobin: 10.2 g/dL — ABNORMAL LOW (ref 13.0–17.0)
Immature Granulocytes: 1 %
Lymphocytes Relative: 21 %
Lymphs Abs: 0.8 10*3/uL (ref 0.7–4.0)
MCH: 32 pg (ref 26.0–34.0)
MCHC: 31 g/dL (ref 30.0–36.0)
MCV: 103.1 fL — ABNORMAL HIGH (ref 80.0–100.0)
Monocytes Absolute: 0.4 10*3/uL (ref 0.1–1.0)
Monocytes Relative: 10 %
Neutro Abs: 2.3 10*3/uL (ref 1.7–7.7)
Neutrophils Relative %: 62 %
Platelets: 125 10*3/uL — ABNORMAL LOW (ref 150–400)
RBC: 3.19 MIL/uL — ABNORMAL LOW (ref 4.22–5.81)
RDW: 14.5 % (ref 11.5–15.5)
WBC: 3.7 10*3/uL — ABNORMAL LOW (ref 4.0–10.5)
nRBC: 0 % (ref 0.0–0.2)

## 2023-09-24 LAB — LIPID PANEL
Cholesterol: 60 mg/dL (ref 0–200)
HDL: 37 mg/dL — ABNORMAL LOW (ref >40)
LDL Cholesterol: 18 mg/dL (ref 0–99)
Total CHOL/HDL Ratio: 1.6 {ratio}
Triglycerides: 27 mg/dL (ref <150)
VLDL: 5 mg/dL (ref 0–40)

## 2023-09-24 LAB — COMPREHENSIVE METABOLIC PANEL
ALT: 37 U/L (ref 0–44)
AST: 35 U/L (ref 15–41)
Albumin: 2.3 g/dL — ABNORMAL LOW (ref 3.5–5.0)
Alkaline Phosphatase: 108 U/L (ref 38–126)
Anion gap: 5 (ref 5–15)
BUN: 26 mg/dL — ABNORMAL HIGH (ref 8–23)
CO2: 22 mmol/L (ref 22–32)
Calcium: 7.5 mg/dL — ABNORMAL LOW (ref 8.9–10.3)
Chloride: 113 mmol/L — ABNORMAL HIGH (ref 98–111)
Creatinine, Ser: 2.24 mg/dL — ABNORMAL HIGH (ref 0.61–1.24)
GFR, Estimated: 32 mL/min — ABNORMAL LOW (ref >60)
Glucose, Bld: 99 mg/dL (ref 70–99)
Potassium: 5.2 mmol/L — ABNORMAL HIGH (ref 3.5–5.1)
Sodium: 140 mmol/L (ref 135–145)
Total Bilirubin: 0.9 mg/dL (ref <1.2)
Total Protein: 4.4 g/dL — ABNORMAL LOW (ref 6.5–8.1)

## 2023-09-24 LAB — T4, FREE: Free T4: 1.02 ng/dL (ref 0.61–1.12)

## 2023-09-24 LAB — TSH: TSH: 3.222 u[IU]/mL (ref 0.350–4.500)

## 2023-09-24 LAB — MAGNESIUM: Magnesium: 1.7 mg/dL (ref 1.7–2.4)

## 2023-09-27 ENCOUNTER — Telehealth: Payer: Self-pay

## 2023-09-27 NOTE — Transitions of Care (Post Inpatient/ED Visit) (Unsigned)
 09/27/2023  Name: Michael Harmon MRN: [id number] DOB: 06-25-1961  Today's TOC FU Call Status: Today's TOC FU Call Status:: Successful TOC FU Call Completed TOC FU Call Complete Date: 09/27/23 Patient's Name and Date of Birth confirmed.  Transition Care Management Follow-up Telephone Call Date of Discharge: 09/26/23 Discharge Facility: Other [facility]) Name of Other (Non[facility]) Discharge Facility: [facility] Type of Discharge: Inpatient Admission Primary Inpatient Discharge Diagnosis:: Localized Edema How have you been since you were released from the hospital?: Better Any questions or concerns?: No  Items Reviewed: Did you receive and understand the discharge instructions provided?: No Medications obtained,verified, and reconciled?: Yes (Medications Reviewed) Any new allergies since your discharge?: No Dietary orders reviewed?: Yes Type of Diet Ordered:: low potassium/gluten free Do you have support at home?: Yes  Medications Reviewed Today: Medications Reviewed Today     Reviewed by Yoseph Haile, Jordan Hawks, CMA (Certified Medical Assistant) on 09/27/23 at 1539  Med List Status: <None>   Medication Order Taking? Sig Documenting Provider Last Dose Status Informant  aspirin EC 81 MG tablet [id number] Yes Take by mouth. [provider] Taking Active Self  carvedilol (COREG) 6.25 MG tablet [id number] Yes Take 6.25 mg by mouth 2 (two) times daily with a meal. [provider] Taking Active Self  Continuous Glucose Sensor (FREESTYLE LIBRE 2 SENSOR) MISC [id number] Yes See admin instructions. [provider] Taking Active   cyanocobalamin (VITAMIN B12) 500 MCG tablet [id number] No Take 1 tablet (500 mcg total) by mouth daily.  Patient not taking: Reported on 09/27/2023   [name], PA-C Not Taking Consider Medication Status and Discontinue (Change in therapy) Self  furosemide (LASIX) 40 MG tablet [id number] Yes Take 40 mg by mouth daily. [provider]  Active   gabapentin (NEURONTIN) 300 MG capsule [id number] Yes Take 300 mg by mouth daily. [provider]  Active   Multiple Vitamins-Minerals (CITRACAL +D3) TABS [id number] No Take by mouth.  Patient not taking: Reported on 09/27/2023   [provider] Not Taking Consider Medication Status and Discontinue (Discontinued by provider)   mycophenolate (MYFORTIC) 180 MG EC tablet [id number] Yes Take 180 mg by mouth 2 (two) times daily. [provider] Taking Active Self  pantoprazole (PROTONIX) 40 MG tablet [id number] Yes Take 40 mg by mouth 2 (two) times daily. [provider] Taking Active Self  tacrolimus (PROGRAF) 1 MG capsule [id number] Yes Take 2-3 mg by mouth 2 (two) times daily. Taking 2 mg in the morning and 3 mg at night [provider] Taking Active Self  tamsulosin (FLOMAX) 0.4 MG CAPS capsule [id number] Yes Take 0.4 mg by mouth daily. [provider] Taking Active Self  torsemide (DEMADEX) 10 MG tablet [id number] No Take 2 tablets by mouth daily.  Patient not taking: Reported on 09/27/2023   [provider] Not Taking Consider Medication Status and Discontinue (Change in therapy)             Home Care and Equipment/Supplies: Were Home Health Services Ordered?: NA Any new equipment or medical supplies ordered?: NA  Functional Questionnaire: Do you need assistance with bathing/showering or dressing?: No Do you need assistance with meal preparation?: No Do you need assistance with eating?: No Do you have difficulty maintaining continence: No Do you need assistance with getting out of bed/getting out of a chair/moving?: No Do you have difficulty managing or taking your medications?: No  Follow up appointments reviewed: PCP Follow-up appointment confirmed?: NA Specialist Hospital Follow-up appointment confirmed?: Yes Date  of Specialist follow-up appointment?: 10/04/23 Follow-Up Specialty Provider::  transplant team + nephrology team at [facility] Do you need transportation to your follow-up appointment?: No Do you understand care options if your condition(s) worsen?: Yes-patient verbalized understanding     Elisabella Hacker, CMA  [facility] AWV Team Direct Dial: [phone]

## 2023-09-28 LAB — TACROLIMUS LEVEL: Tacrolimus (FK506) - LabCorp: 5.3 ng/mL (ref 2.0–20.0)

## 2023-09-29 ENCOUNTER — Other Ambulatory Visit (HOSPITAL_COMMUNITY)
Admission: RE | Admit: 2023-09-29 | Discharge: 2023-09-29 | Disposition: A | Discharge status: Home / Self Care | Payer: BC Managed Care – PPO | Source: Ambulatory Visit | Attending: Radiation Oncology | Admitting: Radiation Oncology

## 2023-09-29 ENCOUNTER — Other Ambulatory Visit (HOSPITAL_COMMUNITY)
Admission: RE | Admit: 2023-09-29 | Discharge: 2023-09-29 | Disposition: A | Discharge status: Home / Self Care | Payer: BC Managed Care – PPO | Source: Ambulatory Visit | Attending: Gastroenterology | Admitting: Gastroenterology

## 2023-09-29 DIAGNOSIS — D631 Anemia in chronic kidney disease: Secondary | ICD-10-CM | POA: Insufficient documentation

## 2023-09-29 DIAGNOSIS — Z944 Liver transplant status: Secondary | ICD-10-CM | POA: Diagnosis not present

## 2023-09-29 DIAGNOSIS — C61 Malignant neoplasm of prostate: Secondary | ICD-10-CM | POA: Insufficient documentation

## 2023-09-29 DIAGNOSIS — D849 Immunodeficiency, unspecified: Secondary | ICD-10-CM | POA: Insufficient documentation

## 2023-09-29 DIAGNOSIS — N1832 Chronic kidney disease, stage 3b: Secondary | ICD-10-CM | POA: Insufficient documentation

## 2023-09-29 LAB — CBC WITH DIFFERENTIAL/PLATELET
Abs Immature Granulocytes: 0.01 10*3/uL (ref 0.00–0.07)
Basophils Absolute: 0 10*3/uL (ref 0.0–0.1)
Basophils Relative: 1 %
Eosinophils Absolute: 0.1 10*3/uL (ref 0.0–0.5)
Eosinophils Relative: 4 %
HCT: 32.3 % — ABNORMAL LOW (ref 39.0–52.0)
Hemoglobin: 10.1 g/dL — ABNORMAL LOW (ref 13.0–17.0)
Immature Granulocytes: 0 %
Lymphocytes Relative: 27 %
Lymphs Abs: 0.8 10*3/uL (ref 0.7–4.0)
MCH: 32.3 pg (ref 26.0–34.0)
MCHC: 31.3 g/dL (ref 30.0–36.0)
MCV: 103.2 fL — ABNORMAL HIGH (ref 80.0–100.0)
Monocytes Absolute: 0.4 10*3/uL (ref 0.1–1.0)
Monocytes Relative: 13 %
Neutro Abs: 1.6 10*3/uL — ABNORMAL LOW (ref 1.7–7.7)
Neutrophils Relative %: 55 %
Platelets: 131 10*3/uL — ABNORMAL LOW (ref 150–400)
RBC: 3.13 MIL/uL — ABNORMAL LOW (ref 4.22–5.81)
RDW: 13.8 % (ref 11.5–15.5)
WBC: 3 10*3/uL — ABNORMAL LOW (ref 4.0–10.5)
nRBC: 0 % (ref 0.0–0.2)

## 2023-09-29 LAB — COMPREHENSIVE METABOLIC PANEL
ALT: 31 U/L (ref 0–44)
AST: 28 U/L (ref 15–41)
Albumin: 2.8 g/dL — ABNORMAL LOW (ref 3.5–5.0)
Alkaline Phosphatase: 118 U/L (ref 38–126)
Anion gap: 5 (ref 5–15)
BUN: 28 mg/dL — ABNORMAL HIGH (ref 8–23)
CO2: 22 mmol/L (ref 22–32)
Calcium: 7.9 mg/dL — ABNORMAL LOW (ref 8.9–10.3)
Chloride: 112 mmol/L — ABNORMAL HIGH (ref 98–111)
Creatinine, Ser: 2.54 mg/dL — ABNORMAL HIGH (ref 0.61–1.24)
GFR, Estimated: 28 mL/min — ABNORMAL LOW (ref >60)
Glucose, Bld: 98 mg/dL (ref 70–99)
Potassium: 5.7 mmol/L — ABNORMAL HIGH (ref 3.5–5.1)
Sodium: 139 mmol/L (ref 135–145)
Total Bilirubin: 0.8 mg/dL (ref <1.2)
Total Protein: 4.8 g/dL — ABNORMAL LOW (ref 6.5–8.1)

## 2023-09-29 LAB — PSA: Prostatic Specific Antigen: 0.03 ng/mL (ref 0.00–4.00)

## 2023-09-29 LAB — MAGNESIUM: Magnesium: 2 mg/dL (ref 1.7–2.4)

## 2023-10-02 LAB — TACROLIMUS LEVEL: Tacrolimus (FK506) - LabCorp: 4.5 ng/mL (ref 2.0–20.0)

## 2023-10-12 ENCOUNTER — Ambulatory Visit: Payer: BC Managed Care – PPO | Admitting: Internal Medicine

## 2023-10-13 ENCOUNTER — Inpatient Hospital Stay: Payer: BC Managed Care – PPO

## 2023-10-13 ENCOUNTER — Inpatient Hospital Stay: Payer: BC Managed Care – PPO | Attending: Physician Assistant

## 2023-10-13 ENCOUNTER — Other Ambulatory Visit: Payer: Self-pay | Admitting: *Deleted

## 2023-10-13 VITALS — BP 119/56 | HR 66 | Temp 97.4°F | Resp 18

## 2023-10-13 DIAGNOSIS — N1832 Chronic kidney disease, stage 3b: Secondary | ICD-10-CM | POA: Insufficient documentation

## 2023-10-13 DIAGNOSIS — D631 Anemia in chronic kidney disease: Secondary | ICD-10-CM | POA: Diagnosis not present

## 2023-10-13 DIAGNOSIS — D849 Immunodeficiency, unspecified: Secondary | ICD-10-CM

## 2023-10-13 DIAGNOSIS — D509 Iron deficiency anemia, unspecified: Secondary | ICD-10-CM | POA: Insufficient documentation

## 2023-10-13 LAB — CBC WITH DIFFERENTIAL/PLATELET
Abs Immature Granulocytes: 0.01 10*3/uL (ref 0.00–0.07)
Basophils Absolute: 0 10*3/uL (ref 0.0–0.1)
Basophils Relative: 1 %
Eosinophils Absolute: 0.1 10*3/uL (ref 0.0–0.5)
Eosinophils Relative: 3 %
HCT: 31.1 % — ABNORMAL LOW (ref 39.0–52.0)
Hemoglobin: 9.7 g/dL — ABNORMAL LOW (ref 13.0–17.0)
Immature Granulocytes: 0 %
Lymphocytes Relative: 20 %
Lymphs Abs: 0.7 10*3/uL (ref 0.7–4.0)
MCH: 30.9 pg (ref 26.0–34.0)
MCHC: 31.2 g/dL (ref 30.0–36.0)
MCV: 99 fL (ref 80.0–100.0)
Monocytes Absolute: 0.3 10*3/uL (ref 0.1–1.0)
Monocytes Relative: 10 %
Neutro Abs: 2.3 10*3/uL (ref 1.7–7.7)
Neutrophils Relative %: 66 %
Platelets: 149 10*3/uL — ABNORMAL LOW (ref 150–400)
RBC: 3.14 MIL/uL — ABNORMAL LOW (ref 4.22–5.81)
RDW: 13.4 % (ref 11.5–15.5)
WBC: 3.4 10*3/uL — ABNORMAL LOW (ref 4.0–10.5)
nRBC: 0 % (ref 0.0–0.2)

## 2023-10-13 LAB — COMPREHENSIVE METABOLIC PANEL
ALT: 44 U/L (ref 0–44)
AST: 34 U/L (ref 15–41)
Albumin: 2.2 g/dL — ABNORMAL LOW (ref 3.5–5.0)
Alkaline Phosphatase: 142 U/L — ABNORMAL HIGH (ref 38–126)
Anion gap: 5 (ref 5–15)
BUN: 16 mg/dL (ref 8–23)
CO2: 18 mmol/L — ABNORMAL LOW (ref 22–32)
Calcium: 7.5 mg/dL — ABNORMAL LOW (ref 8.9–10.3)
Chloride: 115 mmol/L — ABNORMAL HIGH (ref 98–111)
Creatinine, Ser: 2.55 mg/dL — ABNORMAL HIGH (ref 0.61–1.24)
GFR, Estimated: 28 mL/min — ABNORMAL LOW (ref >60)
Glucose, Bld: 127 mg/dL — ABNORMAL HIGH (ref 70–99)
Potassium: 5.2 mmol/L — ABNORMAL HIGH (ref 3.5–5.1)
Sodium: 138 mmol/L (ref 135–145)
Total Bilirubin: 0.5 mg/dL (ref <1.2)
Total Protein: 4.2 g/dL — ABNORMAL LOW (ref 6.5–8.1)

## 2023-10-13 LAB — MAGNESIUM: Magnesium: 1.7 mg/dL (ref 1.7–2.4)

## 2023-10-13 MED ORDER — EPOETIN ALFA-EPBX 10000 UNIT/ML IJ SOLN
10000.0000 [IU] | Freq: Once | INTRAMUSCULAR | Status: AC
  Administered 2023-10-13: 10000 [IU] via SUBCUTANEOUS
  Filled 2023-10-13: qty 1

## 2023-10-13 NOTE — Progress Notes (Signed)
Michael Harmon presents today for injection per the provider's orders.  Retacrit 10,000U administration without incident; injection site WNL; see MAR for injection details.  Patient tolerated procedure well and without incident.  No questions or complaints noted at this time. Patient's hemoglobin noted to be 9.7 today.   Discharged from clinic ambulatory in stable condition. Alert and oriented x 3. F/U with [facility] as scheduled.

## 2023-10-13 NOTE — Patient Instructions (Signed)
[facility] [place] - A DEPT OF [facility]  Discharge Instructions: Thank you for choosing [facility] to provide your oncology and hematology care.  If you have a lab appointment with the Cancer Center - please note that after April 8th, 2024, all labs will be drawn in the cancer center.  You do not have to check in or register with the main entrance as you have in the past but will complete your check-in in the cancer center.  Wear comfortable clothing and clothing appropriate for easy access to any Portacath or PICC line.   We strive to give you quality time with your provider. You may need to reschedule your appointment if you arrive late (15 or more minutes).  Arriving late affects you and other patients whose appointments are after yours.  Also, if you miss three or more appointments without notifying the office, you may be dismissed from the clinic at the provider's discretion.      For prescription refill requests, have your pharmacy contact our office and allow 72 hours for refills to be completed.    Today you received Retacrit 10,000 units injection   .  BELOW ARE SYMPTOMS THAT SHOULD BE REPORTED IMMEDIATELY: *FEVER GREATER THAN 100.4 F (38 C) OR HIGHER *CHILLS OR SWEATING *NAUSEA AND VOMITING THAT IS NOT CONTROLLED WITH YOUR NAUSEA MEDICATION *UNUSUAL SHORTNESS OF BREATH *UNUSUAL BRUISING OR BLEEDING *URINARY PROBLEMS (pain or burning when urinating, or frequent urination) *BOWEL PROBLEMS (unusual diarrhea, constipation, pain near the anus) TENDERNESS IN MOUTH AND THROAT WITH OR WITHOUT PRESENCE OF ULCERS (sore throat, sores in mouth, or a toothache) UNUSUAL RASH, SWELLING OR PAIN  UNUSUAL VAGINAL DISCHARGE OR ITCHING   Items with * indicate a potential emergency and should be followed up as soon as possible or go to the Emergency Department if any problems should occur.  Please show the CHEMOTHERAPY ALERT CARD or IMMUNOTHERAPY ALERT  CARD at check-in to the Emergency Department and triage nurse.  Should you have questions after your visit or need to cancel or reschedule your appointment, please contact [facility] [place] - A DEPT OF Eligha [facility]ideg[facility] [phone]  and follow the prompts.  Office hours are 8:00 a.m. to 4:30 p.m. Monday - Friday. Please note that voicemails left after 4:00 p.m. may not be returned until the following business day.  We are closed weekends and major holidays. You have access to a nurse at all times for urgent questions. Please call the main number to the clinic [phone] and follow the prompts.  For any non-urgent questions, you may also contact your provider using MyChart. We now offer e-Visits for anyone 60 and older to request care online for non-urgent symptoms. For details visit [email or web address].   Also download the MyChart app! Go to the app store, search "MyChart", open the app, select [facility], and log in with your MyChart username and password.

## 2023-10-15 LAB — TACROLIMUS LEVEL: Tacrolimus (FK506) - LabCorp: 6 ng/mL (ref 2.0–20.0)

## 2023-10-21 ENCOUNTER — Encounter: Payer: Self-pay | Admitting: Internal Medicine

## 2023-10-21 NOTE — Telephone Encounter (Signed)
 Care team updated and letter sent for eye exam notes.

## 2023-10-22 ENCOUNTER — Other Ambulatory Visit: Payer: Self-pay | Admitting: Physician Assistant

## 2023-10-22 DIAGNOSIS — N1832 Chronic kidney disease, stage 3b: Secondary | ICD-10-CM

## 2023-10-22 DIAGNOSIS — D500 Iron deficiency anemia secondary to blood loss (chronic): Secondary | ICD-10-CM

## 2023-10-22 NOTE — Progress Notes (Addendum)
Patient contacted nurse line today (10/22/2023) to inform us that he was recently hospitalized at [facility], and his hemoglobin was down to 8.6 at discharge.  Patient inquired whether or not any adjustment in his Retacrit injections is necessary.  Since Hgb is trending downward, we will go ahead and bring patient in next week for labs and Retacrit injection. We will increase frequency of Retacrit 10,000 units every 2 weeks if needed.  In addition to CBC, we will also check ferritin and iron/TIBC. If ferritin is 50-100 or iron saturation 15 to 20%, recommend giving Venofer 300 mg x 1 dose due to functional iron deficiency in the setting of anemia of CKD If ferritin is <50 or iron saturation <15%, would give Venofer 300 mg x 3 doses   [name], PA-C 10/22/23 10:55 PM

## 2023-10-26 IMAGING — CT CT ASPRIRATION BONE MARROW
1 of 2 series · 15 of 28 positions shown, 19 images · non-contrast
Comparison: none

INDICATION: 60-year-old with history of prostate cancer, cirrhosis and
thrombocytopenia.

[Series 2: i-spiral 5.0 br40 · axial · 0.94mm/px · z∈[+1212,+1366]mm · 15 of 48 slices shown, 19 images]
[im 2/48  mediastinal]
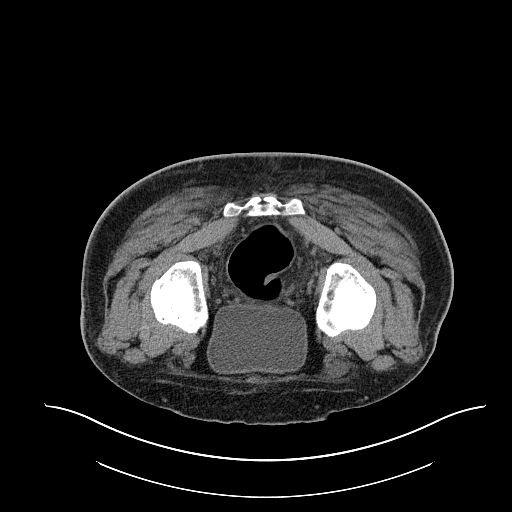
[im 2/48  lung]
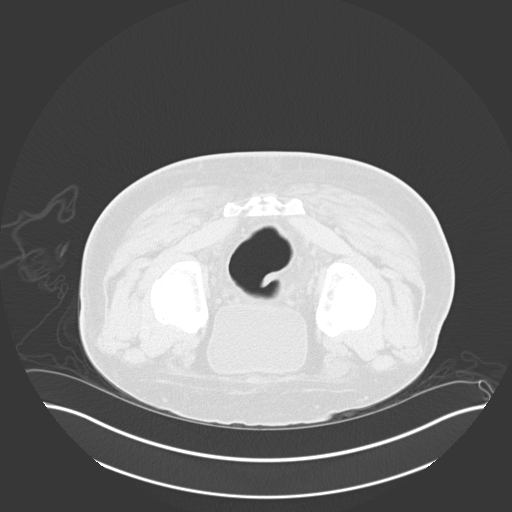
[im 6/48  lung]
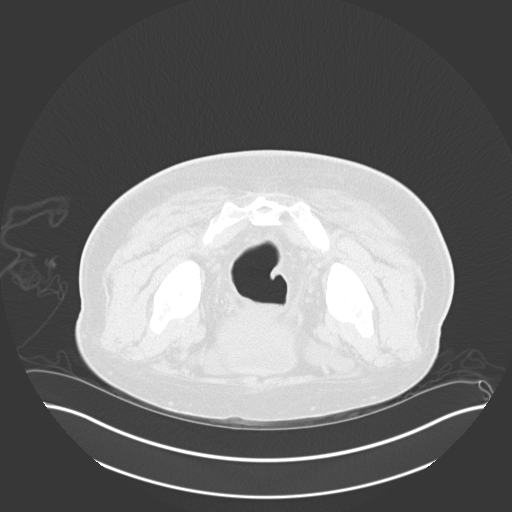
[im 8/48  lung]
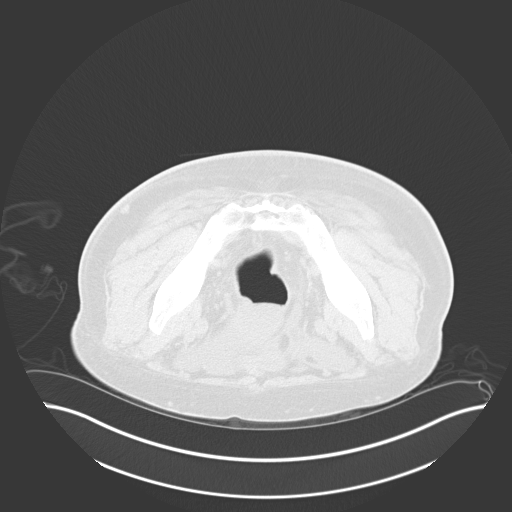
[im 12/48  lung]
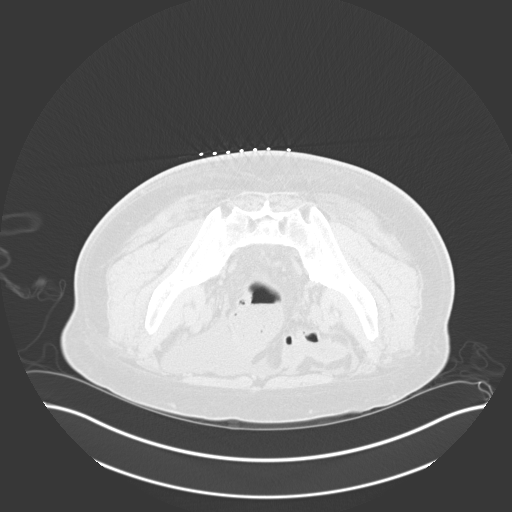
[im 14/48  mediastinal]
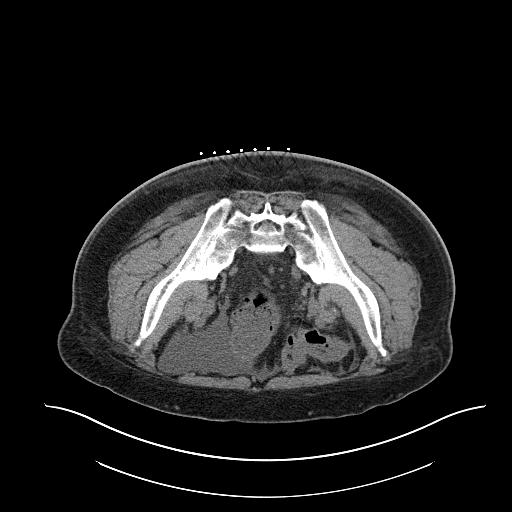
[im 14/48  lung]
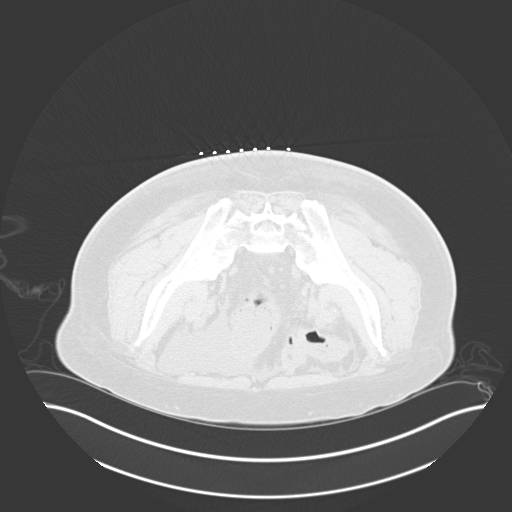
[im 18/48  lung]
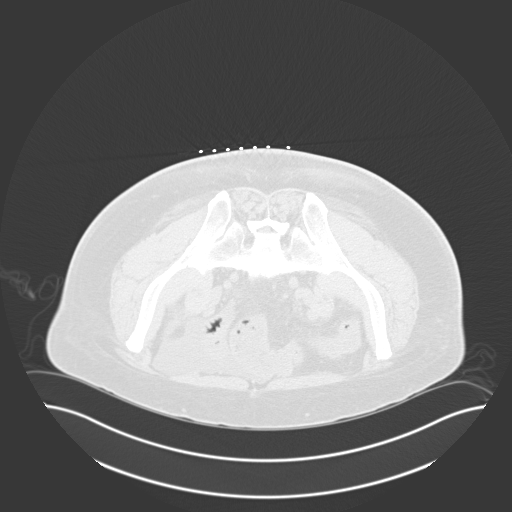
[im 20/48  lung]
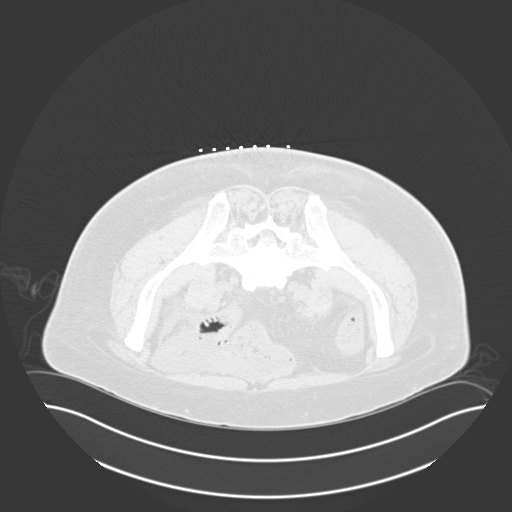
[im 24/48  lung]
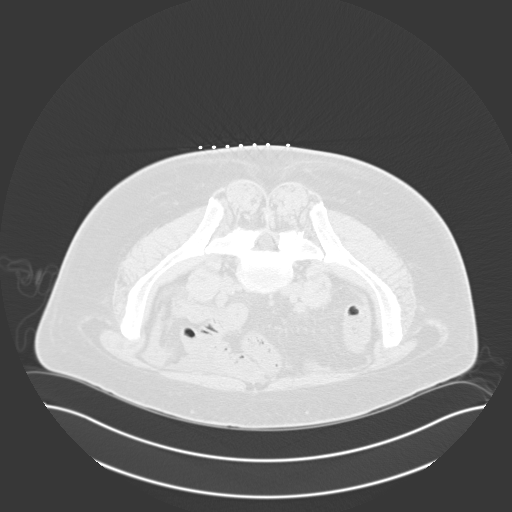
[im 28/48  mediastinal]
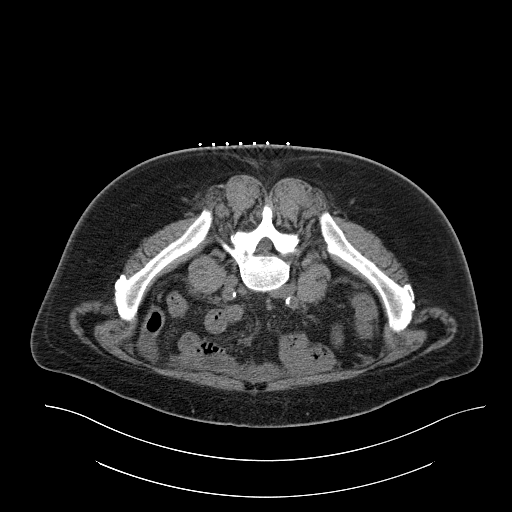
[im 28/48  lung]
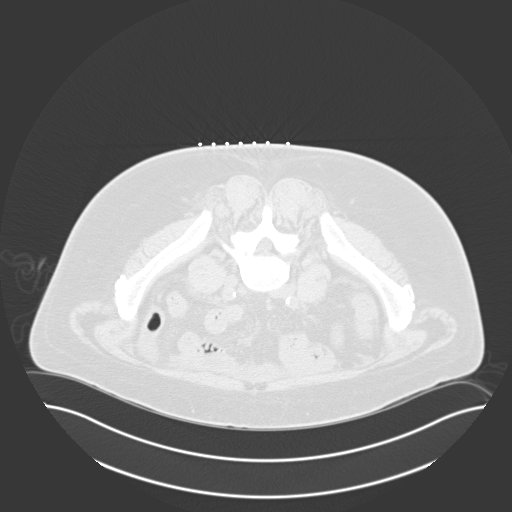
[im 30/48  lung]
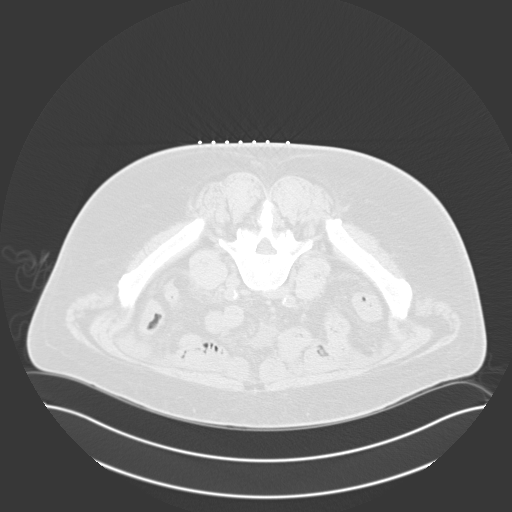
[im 34/48  lung]
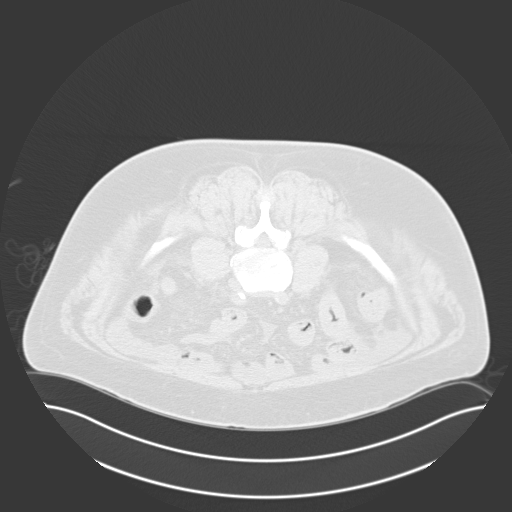
[im 36/48  lung]
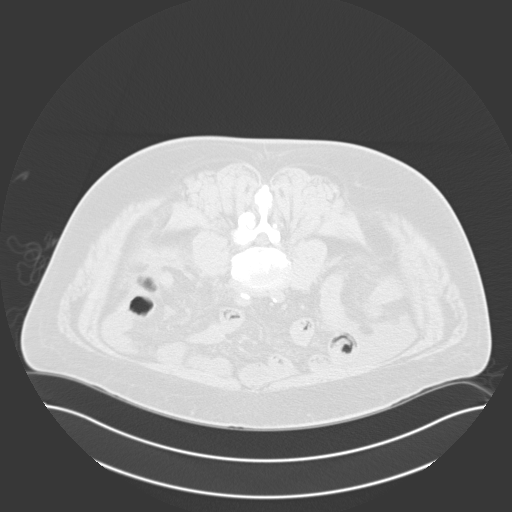
[im 40/48  mediastinal]
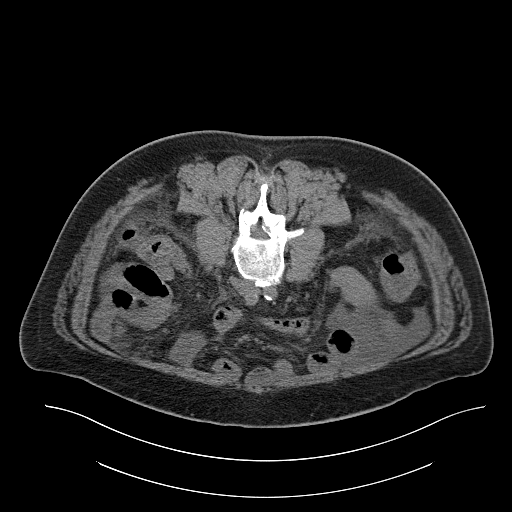
[im 40/48  lung]
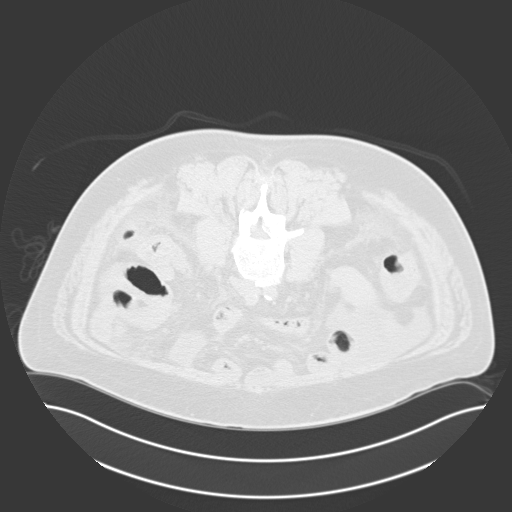
[im 42/48  lung]
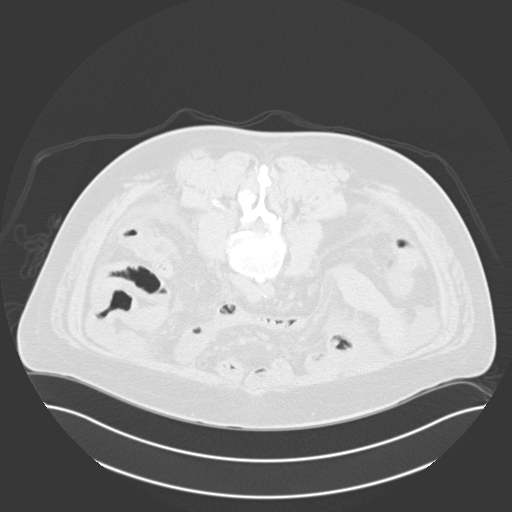
[im 46/48  lung]
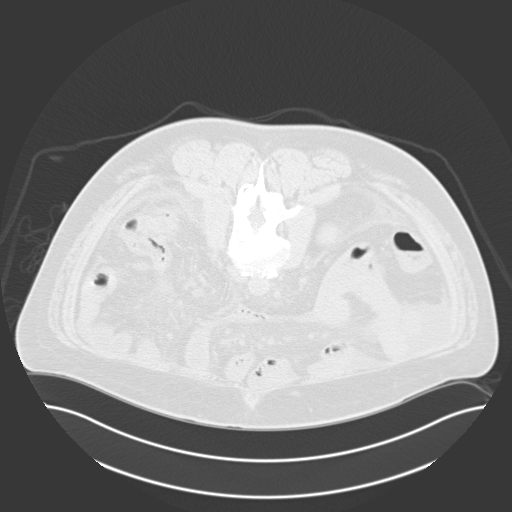

[15 of 28 positions shown; findings below may reference images not displayed]

EXAM:
CT GUIDED BONE MARROW ASPIRATES AND BIOPSY

MEDICATIONS:
None.

ANESTHESIA/SEDATION:
Moderate (conscious) sedation was employed during this procedure. A
total of Versed 4mg and fentanyl 100 mcg was administered
intravenously at the order of the provider performing the procedure.

Total intra-service moderate sedation time: 12 minutes.

Patient's level of consciousness and vital signs were monitored
continuously by radiology nurse throughout the procedure under the
supervision of the provider performing the procedure.

COMPLICATIONS:
None immediate.

PROCEDURE:
The procedure was explained to the patient. The risks and benefits
of the procedure were discussed and the patient's questions were
addressed. Informed consent was obtained from the patient. The
patient was placed prone on CT table. Images of the pelvis were
obtained. The right side of back was prepped and draped in sterile
fashion. The skin and right posterior ilium were anesthetized with
1% lidocaine. 11 gauge bone needle was directed into the right ilium
with CT guidance. Two aspirates and one core biopsy were obtained.
Bandage placed over the puncture site.

RADIATION DOSE REDUCTION: This exam was performed according to the
departmental dose-optimization program which includes automated
exposure control, adjustment of the mA and/or kV according to
patient size and/or use of iterative reconstruction technique.
FINDINGS: Small amount of ascites in the abdomen. Bone needle directed into
the posterior right ilium.
IMPRESSION: 1. CT-guided bone marrow aspiration and core biopsy.
2. Small amount of ascites, probably secondary to known cirrhosis.

## 2023-10-26 IMAGING — CT CT BIOPSY
1 of 2 series · 15 of 28 positions shown, 19 images · non-contrast
Comparison: none

INDICATION: 60-year-old with history of prostate cancer, cirrhosis and
thrombocytopenia.

[Series 2: i-spiral 5.0 br40 · axial · 0.94mm/px · z∈[+1212,+1366]mm · 15 of 48 slices shown, 19 images]
[im 2/48  mediastinal]
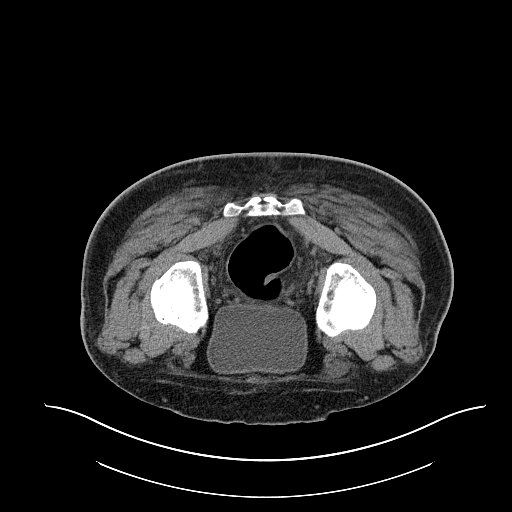
[im 2/48  lung]
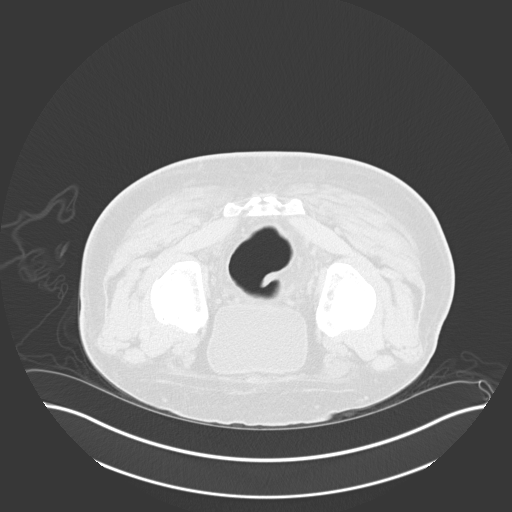
[im 6/48  lung]
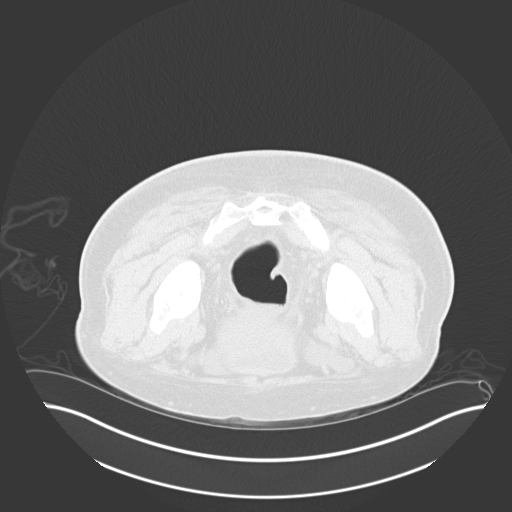
[im 8/48  lung]
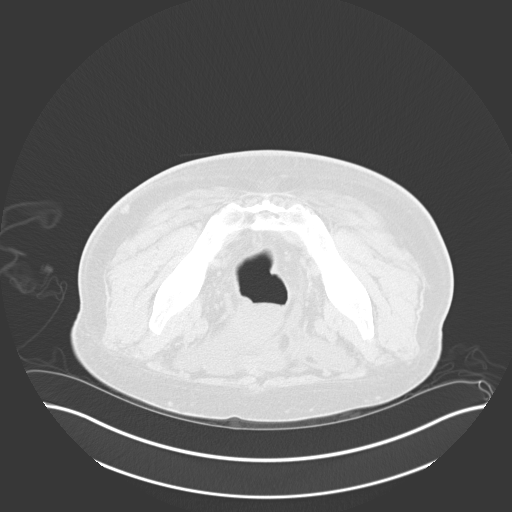
[im 12/48  lung]
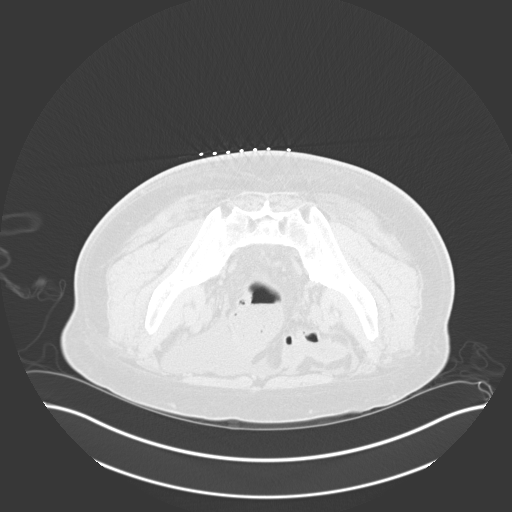
[im 14/48  mediastinal]
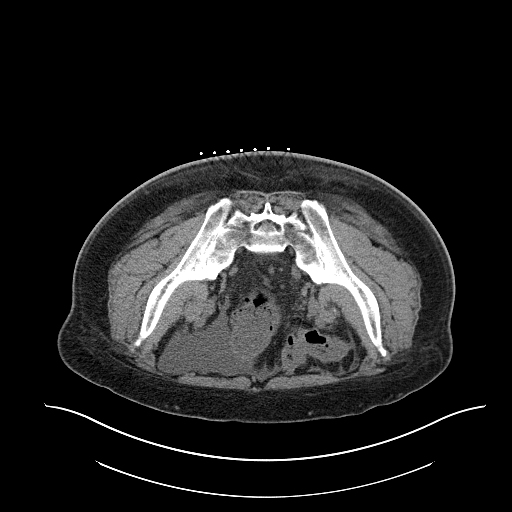
[im 14/48  lung]
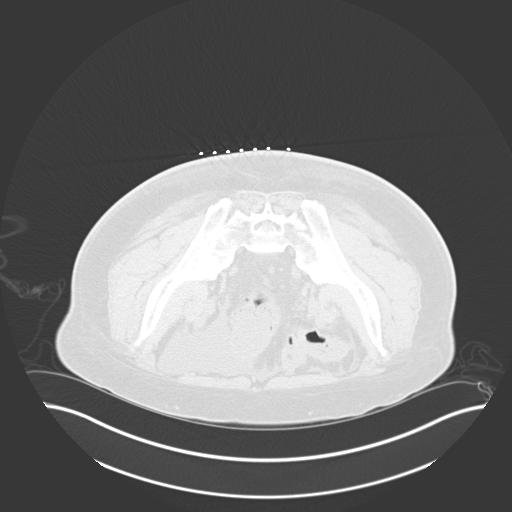
[im 18/48  lung]
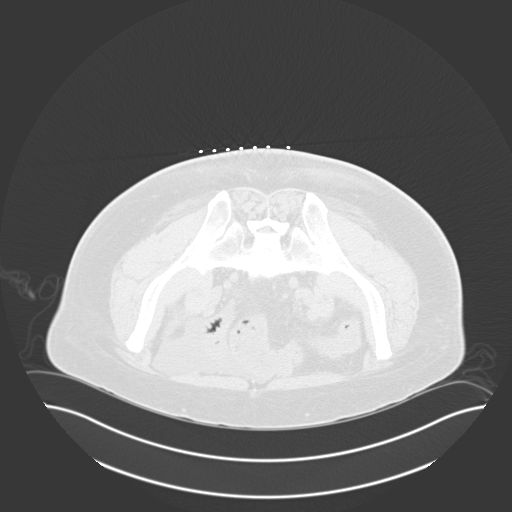
[im 20/48  lung]
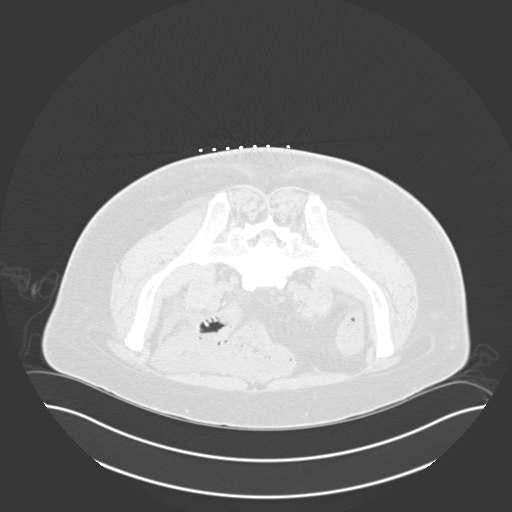
[im 24/48  lung]
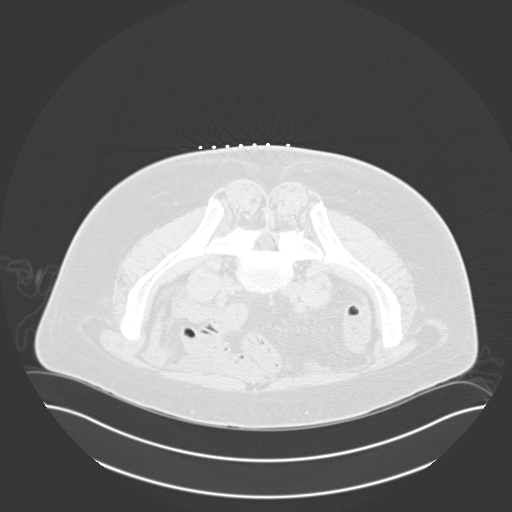
[im 28/48  mediastinal]
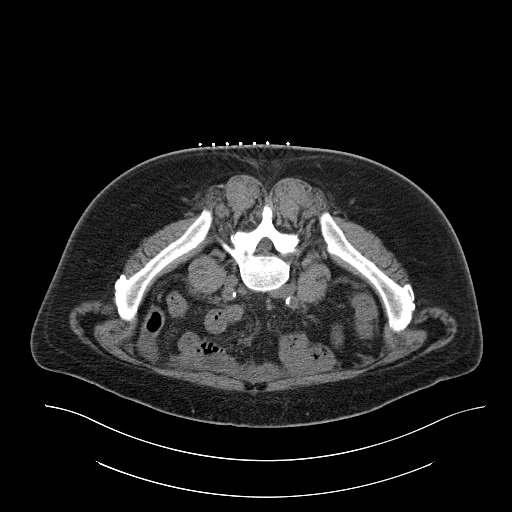
[im 28/48  lung]
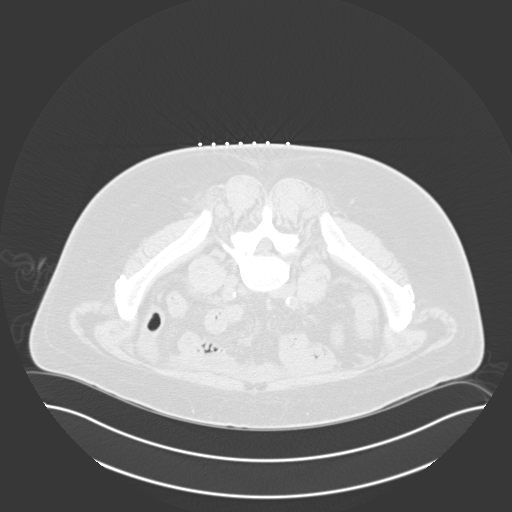
[im 30/48  lung]
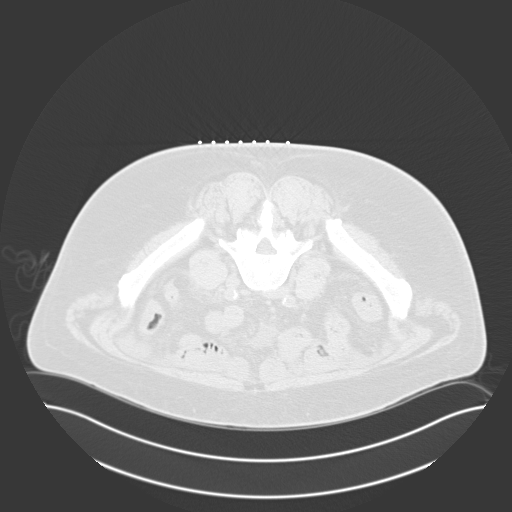
[im 34/48  lung]
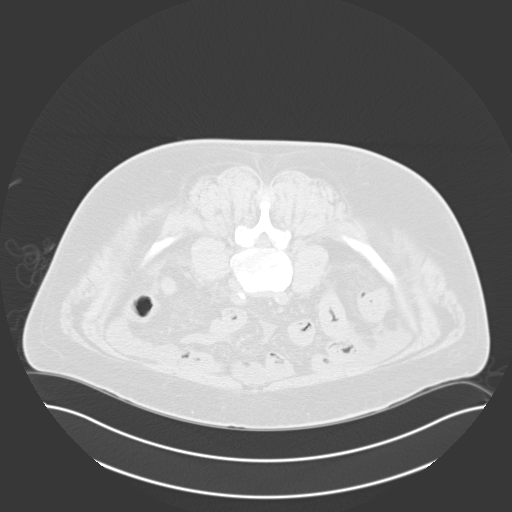
[im 36/48  lung]
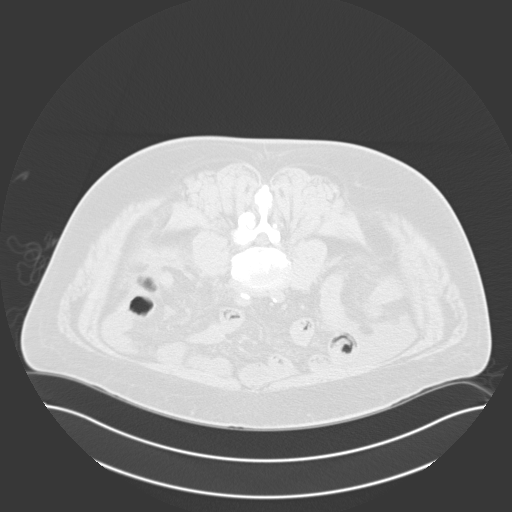
[im 40/48  mediastinal]
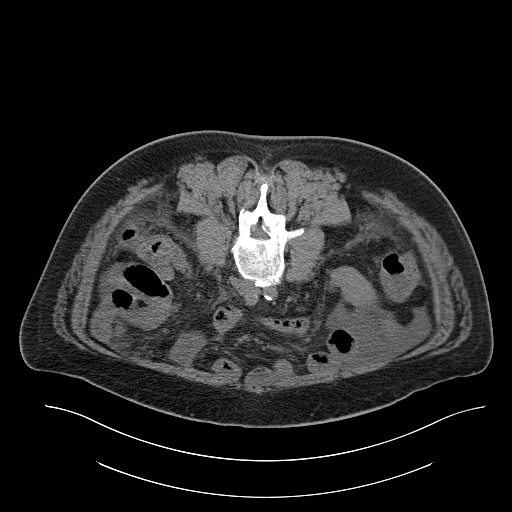
[im 40/48  lung]
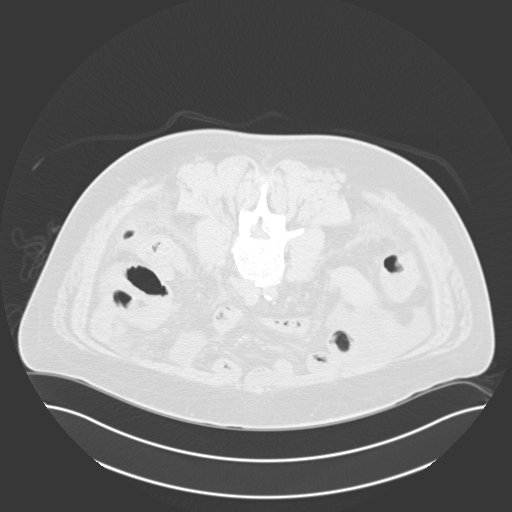
[im 42/48  lung]
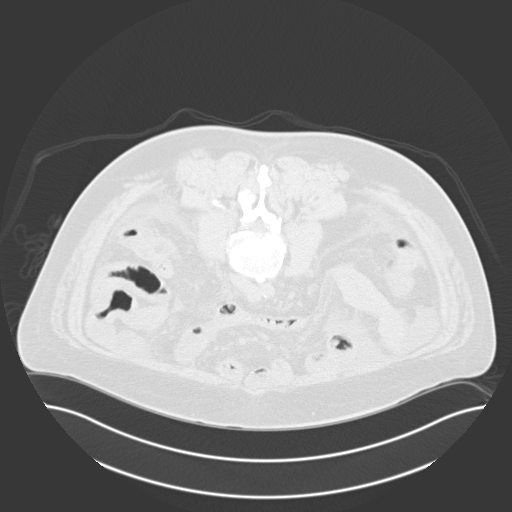
[im 46/48  lung]
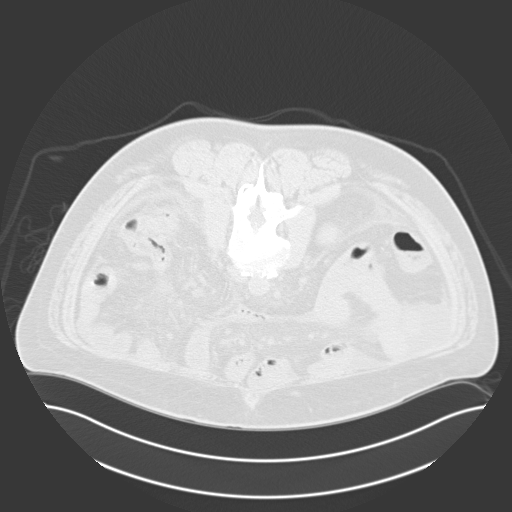

[15 of 28 positions shown; findings below may reference images not displayed]

EXAM:
CT GUIDED BONE MARROW ASPIRATES AND BIOPSY

MEDICATIONS:
None.

ANESTHESIA/SEDATION:
Moderate (conscious) sedation was employed during this procedure. A
total of Versed 4mg and fentanyl 100 mcg was administered
intravenously at the order of the provider performing the procedure.

Total intra-service moderate sedation time: 12 minutes.

Patient's level of consciousness and vital signs were monitored
continuously by radiology nurse throughout the procedure under the
supervision of the provider performing the procedure.

COMPLICATIONS:
None immediate.

PROCEDURE:
The procedure was explained to the patient. The risks and benefits
of the procedure were discussed and the patient's questions were
addressed. Informed consent was obtained from the patient. The
patient was placed prone on CT table. Images of the pelvis were
obtained. The right side of back was prepped and draped in sterile
fashion. The skin and right posterior ilium were anesthetized with
1% lidocaine. 11 gauge bone needle was directed into the right ilium
with CT guidance. Two aspirates and one core biopsy were obtained.
Bandage placed over the puncture site.

RADIATION DOSE REDUCTION: This exam was performed according to the
departmental dose-optimization program which includes automated
exposure control, adjustment of the mA and/or kV according to
patient size and/or use of iterative reconstruction technique.
FINDINGS: Small amount of ascites in the abdomen. Bone needle directed into
the posterior right ilium.
IMPRESSION: 1. CT-guided bone marrow aspiration and core biopsy.
2. Small amount of ascites, probably secondary to known cirrhosis.

## 2023-10-28 ENCOUNTER — Inpatient Hospital Stay: Payer: BC Managed Care – PPO

## 2023-11-04 ENCOUNTER — Other Ambulatory Visit (HOSPITAL_COMMUNITY)
Admission: RE | Admit: 2023-11-04 | Discharge: 2023-11-04 | Disposition: A | Discharge status: Home / Self Care | Payer: BC Managed Care – PPO | Source: Ambulatory Visit | Attending: Gastroenterology | Admitting: Gastroenterology

## 2023-11-04 DIAGNOSIS — Z944 Liver transplant status: Secondary | ICD-10-CM | POA: Insufficient documentation

## 2023-11-04 LAB — CBC WITH DIFFERENTIAL/PLATELET
Abs Immature Granulocytes: 0.02 10*3/uL (ref 0.00–0.07)
Basophils Absolute: 0 10*3/uL (ref 0.0–0.1)
Basophils Relative: 1 %
Eosinophils Absolute: 0.1 10*3/uL (ref 0.0–0.5)
Eosinophils Relative: 2 %
HCT: 31.3 % — ABNORMAL LOW (ref 39.0–52.0)
Hemoglobin: 10.2 g/dL — ABNORMAL LOW (ref 13.0–17.0)
Immature Granulocytes: 1 %
Lymphocytes Relative: 17 %
Lymphs Abs: 0.7 10*3/uL (ref 0.7–4.0)
MCH: 31.3 pg (ref 26.0–34.0)
MCHC: 32.6 g/dL (ref 30.0–36.0)
MCV: 96 fL (ref 80.0–100.0)
Monocytes Absolute: 0.5 10*3/uL (ref 0.1–1.0)
Monocytes Relative: 11 %
Neutro Abs: 2.9 10*3/uL (ref 1.7–7.7)
Neutrophils Relative %: 68 %
Platelets: 189 10*3/uL (ref 150–400)
RBC: 3.26 MIL/uL — ABNORMAL LOW (ref 4.22–5.81)
RDW: 14.3 % (ref 11.5–15.5)
WBC: 4.2 10*3/uL (ref 4.0–10.5)
nRBC: 0 % (ref 0.0–0.2)

## 2023-11-04 LAB — COMPREHENSIVE METABOLIC PANEL
ALT: 36 U/L (ref 0–44)
AST: 45 U/L — ABNORMAL HIGH (ref 15–41)
Albumin: 2.1 g/dL — ABNORMAL LOW (ref 3.5–5.0)
Alkaline Phosphatase: 140 U/L — ABNORMAL HIGH (ref 38–126)
Anion gap: 8 (ref 5–15)
BUN: 26 mg/dL — ABNORMAL HIGH (ref 8–23)
CO2: 19 mmol/L — ABNORMAL LOW (ref 22–32)
Calcium: 7.7 mg/dL — ABNORMAL LOW (ref 8.9–10.3)
Chloride: 114 mmol/L — ABNORMAL HIGH (ref 98–111)
Creatinine, Ser: 2.91 mg/dL — ABNORMAL HIGH (ref 0.61–1.24)
GFR, Estimated: 23 mL/min — ABNORMAL LOW (ref >60)
Glucose, Bld: 108 mg/dL — ABNORMAL HIGH (ref 70–99)
Potassium: 4 mmol/L (ref 3.5–5.1)
Sodium: 141 mmol/L (ref 135–145)
Total Bilirubin: 1 mg/dL (ref 0.0–1.2)
Total Protein: 4.7 g/dL — ABNORMAL LOW (ref 6.5–8.1)

## 2023-11-04 LAB — MAGNESIUM: Magnesium: 1.6 mg/dL — ABNORMAL LOW (ref 1.7–2.4)

## 2023-11-07 LAB — TACROLIMUS LEVEL: Tacrolimus (FK506) - LabCorp: 4.1 ng/mL (ref 2.0–20.0)

## 2023-11-08 ENCOUNTER — Other Ambulatory Visit: Payer: Self-pay | Admitting: Physician Assistant

## 2023-11-08 ENCOUNTER — Encounter: Payer: Self-pay | Admitting: Physician Assistant

## 2023-11-08 NOTE — Progress Notes (Signed)
 Patient had notified clinic on 10/22/2023 that he had Hgb 8.6 following hospitalization at [facility] earlier in December 2024. We have tentatively planned on increasing Retacrit  injections to be given every 2 weeks (rather than 4 weeks).  However, after reviewing his most recent labs from 11/04/2023, Hgb has come back up to 10.2 without having needed the increased dose!  (Iron  levels checked at [facility] on 10/25/2023 did not show any need for IV iron .)  Therefore, we can continue with Retacrit  injections every 4 weeks for the time being, but can certainly increase doses in the future if needed.  Patient notified of the above via MyChart message.  Pleasant CHRISTELLA Barefoot, PA-C 11/08/23 8:14 PM

## 2023-11-10 ENCOUNTER — Inpatient Hospital Stay: Payer: BC Managed Care – PPO

## 2023-11-10 ENCOUNTER — Inpatient Hospital Stay: Payer: BC Managed Care – PPO | Attending: Physician Assistant

## 2023-11-10 VITALS — BP 120/80 | HR 82 | Temp 99.6°F | Resp 18

## 2023-11-10 DIAGNOSIS — D631 Anemia in chronic kidney disease: Secondary | ICD-10-CM | POA: Insufficient documentation

## 2023-11-10 DIAGNOSIS — C61 Malignant neoplasm of prostate: Secondary | ICD-10-CM | POA: Diagnosis not present

## 2023-11-10 DIAGNOSIS — N1832 Chronic kidney disease, stage 3b: Secondary | ICD-10-CM | POA: Diagnosis present

## 2023-11-10 LAB — CBC
HCT: 33.2 % — ABNORMAL LOW (ref 39.0–52.0)
Hemoglobin: 10.4 g/dL — ABNORMAL LOW (ref 13.0–17.0)
MCH: 30.5 pg (ref 26.0–34.0)
MCHC: 31.3 g/dL (ref 30.0–36.0)
MCV: 97.4 fL (ref 80.0–100.0)
Platelets: 238 10*3/uL (ref 150–400)
RBC: 3.41 MIL/uL — ABNORMAL LOW (ref 4.22–5.81)
RDW: 14.5 % (ref 11.5–15.5)
WBC: 8.3 10*3/uL (ref 4.0–10.5)
nRBC: 0 % (ref 0.0–0.2)

## 2023-11-10 MED ORDER — EPOETIN ALFA-EPBX 10000 UNIT/ML IJ SOLN
10000.0000 [IU] | Freq: Once | INTRAMUSCULAR | Status: AC
  Administered 2023-11-10: 10000 [IU] via SUBCUTANEOUS
  Filled 2023-11-10: qty 1

## 2023-11-10 NOTE — Progress Notes (Signed)
 Michael Harmon presents today for injection per the provider's orders.  Retacrit  10,000U administration without incident; injection site WNL; see MAR for injection details.  Patient tolerated procedure well and without incident.  No questions or complaints noted at this time. Patient's hemoglobin noted to be 10.4 today,  Discharged from clinic ambulatory in stable condition. Alert and oriented x 3. F/U with [facility] as scheduled.

## 2023-11-10 NOTE — Patient Instructions (Signed)
 [facility] [place] - A DEPT OF [facility]  Discharge Instructions: Thank you for choosing [facility] to provide your oncology and hematology care.  If you have a lab appointment with the Cancer Center - please note that after April 8th, 2024, all labs will be drawn in the cancer center.  You do not have to check in or register with the main entrance as you have in the past but will complete your check-in in the cancer center.  Wear comfortable clothing and clothing appropriate for easy access to any Portacath or PICC line.   We strive to give you quality time with your provider. You may need to reschedule your appointment if you arrive late (15 or more minutes).  Arriving late affects you and other patients whose appointments are after yours.  Also, if you miss three or more appointments without notifying the office, you may be dismissed from the clinic at the provider's discretion.      For prescription refill requests, have your pharmacy contact our office and allow 72 hours for refills to be completed.    Today you received Retacrit 10,000U injection.     BELOW ARE SYMPTOMS THAT SHOULD BE REPORTED IMMEDIATELY: *FEVER GREATER THAN 100.4 F (38 C) OR HIGHER *CHILLS OR SWEATING *NAUSEA AND VOMITING THAT IS NOT CONTROLLED WITH YOUR NAUSEA MEDICATION *UNUSUAL SHORTNESS OF BREATH *UNUSUAL BRUISING OR BLEEDING *URINARY PROBLEMS (pain or burning when urinating, or frequent urination) *BOWEL PROBLEMS (unusual diarrhea, constipation, pain near the anus) TENDERNESS IN MOUTH AND THROAT WITH OR WITHOUT PRESENCE OF ULCERS (sore throat, sores in mouth, or a toothache) UNUSUAL RASH, SWELLING OR PAIN  UNUSUAL VAGINAL DISCHARGE OR ITCHING   Items with * indicate a potential emergency and should be followed up as soon as possible or go to the Emergency Department if any problems should occur.  Please show the CHEMOTHERAPY ALERT CARD or IMMUNOTHERAPY ALERT CARD at  check-in to the Emergency Department and triage nurse.  Should you have questions after your visit or need to cancel or reschedule your appointment, please contact [facility] [place] - A DEPT OF Eligha Bridegroom [facility] [phone]  and follow the prompts.  Office hours are 8:00 a.m. to 4:30 p.m. Monday - Friday. Please note that voicemails left after 4:00 p.m. may not be returned until the following business day.  We are closed weekends and major holidays. You have access to a nurse at all times for urgent questions. Please call the main number to the clinic [phone] and follow the prompts.  For any non-urgent questions, you may also contact your provider using MyChart. We now offer e-Visits for anyone 75 and older to request care online for non-urgent symptoms. For details visit [email or web address].   Also download the MyChart app! Go to the app store, search "MyChart", open the app, select [facility], and log in with your MyChart username and password.

## 2023-11-12 ENCOUNTER — Ambulatory Visit (INDEPENDENT_AMBULATORY_CARE_PROVIDER_SITE_OTHER): Payer: BC Managed Care – PPO | Admitting: Internal Medicine

## 2023-11-12 VITALS — BP 113/69 | HR 88 | Temp 98.0°F | Ht 67.0 in | Wt 171.6 lb

## 2023-11-12 DIAGNOSIS — N289 Disorder of kidney and ureter, unspecified: Secondary | ICD-10-CM

## 2023-11-12 DIAGNOSIS — K219 Gastro-esophageal reflux disease without esophagitis: Secondary | ICD-10-CM | POA: Diagnosis not present

## 2023-11-12 DIAGNOSIS — T8640 Unspecified complication of liver transplant: Secondary | ICD-10-CM | POA: Diagnosis not present

## 2023-11-12 DIAGNOSIS — K900 Celiac disease: Secondary | ICD-10-CM

## 2023-11-12 DIAGNOSIS — N1832 Chronic kidney disease, stage 3b: Secondary | ICD-10-CM

## 2023-11-12 NOTE — Patient Instructions (Addendum)
 It was good to see you again today.  As discussed, I am concerned about your declining renal function.  This should be followed up closely by the transplant physicians at [facility]  Your most recent liver enzymes are elevated but only mildly so.  For now, continue with a gluten-free diet, 2 g sodium restriction  No change in your medical regimen from my standpoint.  Plan to see you back in 3 months.  As discussed, if you need me before then please do not hesitate to reach out.

## 2023-11-12 NOTE — Progress Notes (Signed)
 [facility]   Primary Care Physician:  Tobie Suzzane POUR, MD Primary Gastroenterologist:  Dr. Shaaron  Pre-Procedure History & Physical: HPI:  Michael Harmon is a 63 y.o. 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  Posttransplant course complicated by sepsis and hyponatremia requiring admission.  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  In fact, end-stage renal disease.  He is being evaluated for transplant at [facility].    History of celiac disease under control with a gluten-free diet. Reflux well-controlled on Protonix  40 mg orally twice daily. Continues to work.  Past Medical History:  Diagnosis Date   Allergy    nose and sinus problems   Anemia    Anemia in chronic kidney disease (CKD) 11/23/2022   Asthma    Celiac disease    Cirrhosis, cryptogenic (HCC)    completed Hep A and B vaccines   DM (diabetes mellitus) (HCC)    Encounter for general adult medical examination with abnormal findings 09/08/2022   GERD (gastroesophageal reflux disease)    Hyperlipidemia    diet controlled now with 100 pound weight loss   Hypertension    Iron  deficiency anemia due to chronic blood loss 08/13/2021   Malignant neoplasm of prostate (HCC) 10/03/2021   Murmur, heart 03/02/2022   Narrow angle glaucoma suspect of both eyes    Renal cyst 09/03/2017   right   Situational depression 07/06/2017   Splenomegaly    Thrombocytopenia (HCC)    hematology, ?related to chol med (tricor) and glimeripide, just being monitored, above 100,000    Past Surgical History:  Procedure Laterality Date   BIOPSY  07/09/2016   Procedure: BIOPSY;  Surgeon: Lamar CHRISTELLA Shaaron, MD;  [facility];  Service: Endoscopy;;  duodenal, gastric, esophaageal   BIOPSY  07/31/2021   Procedure: BIOPSY;  Surgeon: Shaaron Lamar CHRISTELLA, MD;  [facility];  Service: Endoscopy;;   CLOSED MANIPULATION SHOULDER     COLONOSCOPY  11/2015   Dr. Tobie:  cecal bx neg for microscopic colitis. ascending colon polyp (adenomatous)   COLONOSCOPY WITH PROPOFOL  N/A 07/31/2021   Procedure: COLONOSCOPY WITH PROPOFOL ;  Surgeon: Shaaron Lamar CHRISTELLA, MD;  [facility];  Service: Endoscopy;  Laterality: N/A;  7:30am   ESOPHAGEAL BANDING N/A 08/04/2017   Procedure: ESOPHAGEAL BANDING;  Surgeon: Shaaron Lamar CHRISTELLA, MD;  [facility];  Service: Endoscopy;  Laterality: N/A;  esophageal varices banding   ESOPHAGEAL BANDING N/A 10/13/2017   Procedure: ESOPHAGEAL BANDING;  Surgeon: Shaaron Lamar CHRISTELLA, MD;  [facility];  Service: Endoscopy;  Laterality: N/A;   ESOPHAGEAL BANDING N/A 04/09/2020   Procedure: ESOPHAGEAL BANDING;  Surgeon: Shaaron Lamar CHRISTELLA, MD;  [facility];  Service: Endoscopy;  Laterality: N/A;   ESOPHAGOGASTRODUODENOSCOPY N/A 07/09/2016   Procedure: ESOPHAGOGASTRODUODENOSCOPY (EGD);  Surgeon: Lamar CHRISTELLA Shaaron, MD;  [facility];  Service: Endoscopy;  Laterality: N/A;  730   ESOPHAGOGASTRODUODENOSCOPY N/A 08/04/2017   Procedure: ESOPHAGOGASTRODUODENOSCOPY (EGD);  Surgeon: Shaaron Lamar CHRISTELLA, MD;  [facility];  Service: Endoscopy;  Laterality: N/A;  7:30am   ESOPHAGOGASTRODUODENOSCOPY N/A 10/13/2017   Dr. Shaaron: grade 2 esophageal varices status post banding, portal hypertensive gastropathy   ESOPHAGOGASTRODUODENOSCOPY  11/23/2019   Evea Sheek: Esophageal varices, 4 columns of grade 2-3 status post banding.  Varices more prominent than seen in December 2018.  Portal gastropathy.  Normal-appearing small bowel.   ESOPHAGOGASTRODUODENOSCOPY (EGD) WITH PROPOFOL  N/A 04/09/2020   Dr. Shaaron:  4 columns of grade 2/grade 3 esophageal varices somewhat recalcitrant.  Status post esophageal band ligation today.  Portal gastropathy.  Normal-appearing small bowel.   ESOPHAGOGASTRODUODENOSCOPY (EGD) WITH PROPOFOL  N/A 07/31/2021   Procedure: ESOPHAGOGASTRODUODENOSCOPY (EGD) WITH PROPOFOL ;  Surgeon: Shaaron Lamar HERO, MD;  [facility];  Service: Endoscopy;  Laterality: N/A;   GOLD SEED IMPLANT N/A 01/15/2022   Procedure: GOLD SEED IMPLANT;  Surgeon: Sherrilee Belvie CROME, MD;  Location: [facility];  Service: Urology;  Laterality: N/A;   LIVER TRANSPLANT  01/16/2023   SPACE OAR INSTILLATION N/A 01/15/2022   Procedure: SPACE OAR INSTILLATION;  Surgeon: Sherrilee Belvie CROME, MD;  Location: [facility];  Service: Urology;  Laterality: N/A;   US  PARACENTESIS  06/10/2022    Prior to Admission medications   Medication Sig Start Date End Date Taking? Authorizing Provider  aspirin  EC 81 MG tablet Take by mouth. 01/18/23   [provider]  carvedilol  (COREG ) 6.25 MG tablet Take 6.25 mg by mouth 2 (two) times daily with a meal. 08/11/23 08/10/24  [provider]  Continuous Glucose Sensor (FREESTYLE LIBRE 2 SENSOR) MISC See admin instructions. 08/23/23   [provider]  cyanocobalamin  (VITAMIN B12) 500 MCG tablet Take 1 tablet (500 mcg total) by mouth daily. Patient not taking: Reported on 09/27/2023 03/30/23   Lamon Pleasant HERO, PA-C  furosemide  (LASIX ) 40 MG tablet Take 40 mg by mouth daily. 09/26/23 09/25/24  [provider]  gabapentin (NEURONTIN) 300 MG capsule Take 300 mg by mouth daily. 09/27/23 09/26/24  [provider]  Multiple Vitamins-Minerals (CITRACAL +D3) TABS Take by mouth.    [provider]  mycophenolate  (MYFORTIC ) 180 MG EC tablet Take 180 mg by mouth 2 (two) times daily.    [provider]  pantoprazole  (PROTONIX ) 40 MG tablet Take 40 mg by mouth 2 (two) times daily.    [provider]  tacrolimus  (PROGRAF ) 1 MG capsule Take 2-3 mg by mouth 2 (two) times daily. Taking 2 mg in the morning and 3 mg at night 03/03/23 03/02/24  [provider]  tamsulosin  (FLOMAX ) 0.4 MG CAPS capsule Take 0.4 mg by mouth daily. 06/21/23   [provider]  torsemide (DEMADEX) 10 MG tablet Take 2 tablets by mouth daily. Patient not taking: Reported on  09/27/2023 08/24/23 11/22/23  [provider]    Allergies as of 11/12/2023 - Review Complete 11/10/2023  Allergen Reaction Noted   Penicillins Other (See Comments) 04/07/2016   Gluten meal Diarrhea 10/06/2017   Biaxin [clarithromycin] Tinitus     Family History  Problem Relation Age of Onset   Other Mother        chronic diarrhea   COPD Mother    Heart disease Mother        died at 55   Arthritis Mother    Cancer Mother    Depression Mother    Diabetes Mother    Hyperlipidemia Mother    Hypertension Mother    Stroke Mother    Arthritis Father    Asthma Father    Birth defects Father    Heart disease Father        aortic valve replaced   Heart disease Maternal Grandmother    Stroke Maternal Grandmother    Heart disease Maternal Grandfather    Stroke Maternal Grandfather    Diabetes Paternal Grandmother    Stroke Paternal Grandfather    Heart disease Paternal Grandfather    Liver disease Neg Hx    Colon  cancer Neg Hx     Social History   Socioeconomic History   Marital status: Married    Spouse name: Melissa   Number of children: 0   Years of education: 14   Highest education level: Not on file  Occupational History   Occupation: lexicographer, 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   Has no children.   Eats all food groups.   Works for [employer]       Social Drivers of Corporate Investment Banker Strain: Low Risk  (10/14/2023)   Received from [facility]   Overall Financial Resource Strain (CARDIA)    Difficulty of Paying Living Expenses: Not hard at all  Food Insecurity: No  Food Insecurity (10/14/2023)   Received from [facility]   Hunger Vital Sign    Worried About Running Out of Food in the Last Year: Never true    Ran Out of Food in the Last Year: Never true  Transportation Needs: No Transportation Needs (10/14/2023)   Received from [facility] - Transportation    In the past 12 months, has lack of transportation kept you from medical appointments or from getting medications?: No    Lack of Transportation (Non-Medical): No  Physical Activity: Not on file  Stress: Not on file  Social Connections: Not on file  Intimate Partner Violence: Not At Risk (08/18/2023)   Humiliation, Afraid, Rape, and Kick questionnaire    Fear of Current or Ex-Partner: No    Emotionally Abused: No    Physically Abused: No    Sexually Abused: No    Review of Systems: See HPI, otherwise negative ROS  Physical Exam: There were no vitals taken for this visit. General:   Alert, chronically ill gaunt appearing gentleman appears pleasant no acute distress.  Accompanied by his wife today  Lungs:  Clear throughout to auscultation.   No wheezes, crackles, or rhonchi. No acute distress. Heart:  Regular rate and rhythm; no murmurs, clicks, rubs,  or gallops. Abdomen: Non-distended, transplant scar well-healing normal bowel sounds.  Soft and nontender without appreciable mass or hepatosplenomegaly.   Impression/Plan: Very very pleasant 63 year old gentleman with the MASLD, status post orthotopic liver transplantation last year.  Bumpy post transplant course with hyponatremia and sepsis.  Unfortunately, progressive renal dysfunction; he is now pursuing a kidney transplant.  Celiac disease in remission.  He does an extremely good job with a gluten-free diet  GERD well-controlled on twice daily Protonix  at this time.  Recommendations:  For now, continue with a gluten-free diet, 2 g sodium restriction  No change in your medical regimen  from my standpoint.  Plan to see you back in 3 months.  Continue to follow-up closely with [facility] as they are from the center regarding your care at this time.  Protonix  40 g twice daily  Patient is to reach out to me if any problems between now and next office visit here.  Notice: This dictation was prepared with Dragon dictation along with smaller phrase technology. Any transcriptional errors that result  from this process are unintentional and may not be corrected upon review.

## 2023-11-23 ENCOUNTER — Other Ambulatory Visit (HOSPITAL_COMMUNITY)
Admission: RE | Admit: 2023-11-23 | Discharge: 2023-11-23 | Disposition: A | Discharge status: Home / Self Care | Payer: BC Managed Care – PPO | Source: Ambulatory Visit | Attending: Gastroenterology | Admitting: Gastroenterology

## 2023-11-23 DIAGNOSIS — Z944 Liver transplant status: Secondary | ICD-10-CM | POA: Insufficient documentation

## 2023-11-23 LAB — CBC WITH DIFFERENTIAL/PLATELET
Abs Immature Granulocytes: 0.04 10*3/uL (ref 0.00–0.07)
Basophils Absolute: 0 10*3/uL (ref 0.0–0.1)
Basophils Relative: 0 %
Eosinophils Absolute: 0.1 10*3/uL (ref 0.0–0.5)
Eosinophils Relative: 1 %
HCT: 32.2 % — ABNORMAL LOW (ref 39.0–52.0)
Hemoglobin: 10.5 g/dL — ABNORMAL LOW (ref 13.0–17.0)
Immature Granulocytes: 1 %
Lymphocytes Relative: 12 %
Lymphs Abs: 0.6 10*3/uL — ABNORMAL LOW (ref 0.7–4.0)
MCH: 31.5 pg (ref 26.0–34.0)
MCHC: 32.6 g/dL (ref 30.0–36.0)
MCV: 96.7 fL (ref 80.0–100.0)
Monocytes Absolute: 0.4 10*3/uL (ref 0.1–1.0)
Monocytes Relative: 8 %
Neutro Abs: 4.1 10*3/uL (ref 1.7–7.7)
Neutrophils Relative %: 78 %
Platelets: 222 10*3/uL (ref 150–400)
RBC: 3.33 MIL/uL — ABNORMAL LOW (ref 4.22–5.81)
RDW: 15.3 % (ref 11.5–15.5)
WBC: 5.3 10*3/uL (ref 4.0–10.5)
nRBC: 0 % (ref 0.0–0.2)

## 2023-11-23 LAB — COMPREHENSIVE METABOLIC PANEL
ALT: 28 U/L (ref 0–44)
AST: 37 U/L (ref 15–41)
Albumin: 1.9 g/dL — ABNORMAL LOW (ref 3.5–5.0)
Alkaline Phosphatase: 167 U/L — ABNORMAL HIGH (ref 38–126)
Anion gap: 12 (ref 5–15)
BUN: 37 mg/dL — ABNORMAL HIGH (ref 8–23)
CO2: 19 mmol/L — ABNORMAL LOW (ref 22–32)
Calcium: 7.4 mg/dL — ABNORMAL LOW (ref 8.9–10.3)
Chloride: 107 mmol/L (ref 98–111)
Creatinine, Ser: 4.33 mg/dL — ABNORMAL HIGH (ref 0.61–1.24)
GFR, Estimated: 15 mL/min — ABNORMAL LOW (ref >60)
Glucose, Bld: 99 mg/dL (ref 70–99)
Potassium: 4.3 mmol/L (ref 3.5–5.1)
Sodium: 138 mmol/L (ref 135–145)
Total Bilirubin: 1.2 mg/dL (ref 0.0–1.2)
Total Protein: 4.4 g/dL — ABNORMAL LOW (ref 6.5–8.1)

## 2023-11-23 LAB — MAGNESIUM: Magnesium: 1.6 mg/dL — ABNORMAL LOW (ref 1.7–2.4)

## 2023-11-25 LAB — TACROLIMUS LEVEL: Tacrolimus (FK506) - LabCorp: 5.4 ng/mL (ref 2.0–20.0)

## 2023-11-26 ENCOUNTER — Ambulatory Visit: Payer: BC Managed Care – PPO | Admitting: Urology

## 2023-11-27 DIAGNOSIS — N179 Acute kidney failure, unspecified: Secondary | ICD-10-CM | POA: Insufficient documentation

## 2023-11-29 ENCOUNTER — Other Ambulatory Visit (HOSPITAL_COMMUNITY)
Admission: RE | Admit: 2023-11-29 | Discharge: 2023-11-29 | Disposition: A | Discharge status: Home / Self Care | Payer: BC Managed Care – PPO | Source: Ambulatory Visit | Attending: Gastroenterology | Admitting: Gastroenterology

## 2023-11-29 ENCOUNTER — Telehealth: Payer: Self-pay

## 2023-11-29 DIAGNOSIS — Z944 Liver transplant status: Secondary | ICD-10-CM | POA: Insufficient documentation

## 2023-11-29 DIAGNOSIS — D849 Immunodeficiency, unspecified: Secondary | ICD-10-CM | POA: Insufficient documentation

## 2023-11-29 LAB — DIFFERENTIAL
Abs Immature Granulocytes: 0.03 10*3/uL (ref 0.00–0.07)
Basophils Absolute: 0 10*3/uL (ref 0.0–0.1)
Basophils Relative: 1 %
Eosinophils Absolute: 0.1 10*3/uL (ref 0.0–0.5)
Eosinophils Relative: 2 %
Immature Granulocytes: 1 %
Lymphocytes Relative: 19 %
Lymphs Abs: 0.7 10*3/uL (ref 0.7–4.0)
Monocytes Absolute: 0.4 10*3/uL (ref 0.1–1.0)
Monocytes Relative: 10 %
Neutro Abs: 2.6 10*3/uL (ref 1.7–7.7)
Neutrophils Relative %: 67 %

## 2023-11-29 LAB — CBC
HCT: 29.9 % — ABNORMAL LOW (ref 39.0–52.0)
Hemoglobin: 9.3 g/dL — ABNORMAL LOW (ref 13.0–17.0)
MCH: 31.2 pg (ref 26.0–34.0)
MCHC: 31.1 g/dL (ref 30.0–36.0)
MCV: 100.3 fL — ABNORMAL HIGH (ref 80.0–100.0)
Platelets: 173 10*3/uL (ref 150–400)
RBC: 2.98 MIL/uL — ABNORMAL LOW (ref 4.22–5.81)
RDW: 15.1 % (ref 11.5–15.5)
WBC: 3.8 10*3/uL — ABNORMAL LOW (ref 4.0–10.5)
nRBC: 0 % (ref 0.0–0.2)

## 2023-11-29 LAB — COMPREHENSIVE METABOLIC PANEL
ALT: 30 U/L (ref 0–44)
AST: 47 U/L — ABNORMAL HIGH (ref 15–41)
Albumin: 2.3 g/dL — ABNORMAL LOW (ref 3.5–5.0)
Alkaline Phosphatase: 141 U/L — ABNORMAL HIGH (ref 38–126)
Anion gap: 6 (ref 5–15)
BUN: 28 mg/dL — ABNORMAL HIGH (ref 8–23)
CO2: 22 mmol/L (ref 22–32)
Calcium: 7.5 mg/dL — ABNORMAL LOW (ref 8.9–10.3)
Chloride: 107 mmol/L (ref 98–111)
Creatinine, Ser: 2.67 mg/dL — ABNORMAL HIGH (ref 0.61–1.24)
GFR, Estimated: 26 mL/min — ABNORMAL LOW (ref >60)
Glucose, Bld: 137 mg/dL — ABNORMAL HIGH (ref 70–99)
Potassium: 5.3 mmol/L — ABNORMAL HIGH (ref 3.5–5.1)
Sodium: 135 mmol/L (ref 135–145)
Total Bilirubin: 1.1 mg/dL (ref 0.0–1.2)
Total Protein: 4.6 g/dL — ABNORMAL LOW (ref 6.5–8.1)

## 2023-11-29 LAB — MAGNESIUM: Magnesium: 2 mg/dL (ref 1.7–2.4)

## 2023-11-29 NOTE — Telephone Encounter (Signed)
Copied from CRM [id number]. Topic: General - Other >> Nov 29, 2023  2:18 PM Antony Haste wrote: Reason for CRM: [facility] Staff received a referral for this patient. Confirmed his last visit date with PCP on 09/17/23. Advised by the staff member that Brentyn may have to schedule a visit because face-to-face is needed. She is wanting to know if Dr. Allena Katz will still sign the home health orders? Callback [phone]

## 2023-11-30 NOTE — Telephone Encounter (Signed)
Spoke to Will from [employer], states that they were able to get another one of his care team MD to sign off on the forms it has been taken of. Just an FYI.

## 2023-12-01 LAB — TACROLIMUS LEVEL: Tacrolimus (FK506) - LabCorp: 6 ng/mL (ref 2.0–20.0)

## 2023-12-07 ENCOUNTER — Ambulatory Visit: Payer: BC Managed Care – PPO | Admitting: Internal Medicine

## 2023-12-07 VITALS — BP 108/69 | HR 99 | Ht 67.0 in | Wt 174.2 lb

## 2023-12-07 DIAGNOSIS — R296 Repeated falls: Secondary | ICD-10-CM | POA: Insufficient documentation

## 2023-12-07 DIAGNOSIS — Z944 Liver transplant status: Secondary | ICD-10-CM | POA: Diagnosis not present

## 2023-12-07 DIAGNOSIS — I8500 Esophageal varices without bleeding: Secondary | ICD-10-CM

## 2023-12-07 DIAGNOSIS — R5381 Other malaise: Secondary | ICD-10-CM

## 2023-12-07 DIAGNOSIS — D849 Immunodeficiency, unspecified: Secondary | ICD-10-CM

## 2023-12-07 DIAGNOSIS — N1832 Chronic kidney disease, stage 3b: Secondary | ICD-10-CM

## 2023-12-07 DIAGNOSIS — E1169 Type 2 diabetes mellitus with other specified complication: Secondary | ICD-10-CM | POA: Diagnosis not present

## 2023-12-07 DIAGNOSIS — E8809 Other disorders of plasma-protein metabolism, not elsewhere classified: Secondary | ICD-10-CM

## 2023-12-07 DIAGNOSIS — E875 Hyperkalemia: Secondary | ICD-10-CM

## 2023-12-07 DIAGNOSIS — Z794 Long term (current) use of insulin: Secondary | ICD-10-CM

## 2023-12-07 NOTE — Patient Instructions (Addendum)
Please continue taking medications as prescribed.  Please continue to maintain high protein diet and perform physical exercise with PT.  You are being referred to PT.  [facility] [address] [place], [place] [address] Ph: [phone]

## 2023-12-07 NOTE — Assessment & Plan Note (Signed)
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

## 2023-12-07 NOTE — Progress Notes (Signed)
 Established Patient Office Visit  Subjective:  Patient ID: Michael Harmon, male    DOB: [birth year]  Age: 63 y.o. MRN: [id number]  CC:  Chief Complaint  Patient presents with   Care Management    Needs physical therapy orders.     HPI Michael Harmon is a 63 y.o. male with past medical history of HTN, hepatic cirrhosis, portal hypertension, esophageal varices, celiac disease, type II DM, HLD, thrombocytopenia and IDA who presents for f/u of recent hospitalization at [facility] health after a fall.  He was admitted at [facility] due to a fall on 11/21/23 due to weakness of LE.  Upon evaluation, he was afebrile, no signs of systemic infection.  He had orthostatic vitals positive on initial evaluation, but has been off of Coreg  recently.  He was found to have AKI on CKD.  His Lasix  and metolazone have her head during the admission, which improved his kidney function.  His Lasix  was resumed upon discharge.  He was also given Veltassa for chronic hyperkalemia.  He was discharged on 11/27/23 with home PT/OT.  He has been participating in home PT, and appears to have better strength of LE.  He is able to walk without support currently.  He would benefit better from outpatient physical therapy center evaluation at this point.  Past Medical History:  Diagnosis Date   Allergy    nose and sinus problems   Anemia    Anemia in chronic kidney disease (CKD) 11/23/2022   Asthma    Celiac disease    Cirrhosis, cryptogenic (HCC)    completed Hep A and B vaccines   DM (diabetes mellitus) (HCC)    Encounter for general adult medical examination with abnormal findings 09/08/2022   GERD (gastroesophageal reflux disease)    Hyperlipidemia    diet controlled now with 100 pound weight loss   Hypertension    Iron  deficiency anemia due to chronic blood loss 08/13/2021   Malignant neoplasm of prostate (HCC) 10/03/2021   Murmur, heart 03/02/2022   Narrow angle glaucoma suspect of both eyes    Renal cyst  09/03/2017   right   Situational depression 07/06/2017   Splenomegaly    Thrombocytopenia (HCC)    hematology, ?related to chol med (tricor) and glimeripide, just being monitored, above 100,000    Past Surgical History:  Procedure Laterality Date   BIOPSY  07/09/2016   Procedure: BIOPSY;  Surgeon: Lamar CHRISTELLA Hollingshead, MD;  [facility];  Service: Endoscopy;;  duodenal, gastric, esophaageal   BIOPSY  07/31/2021   Procedure: BIOPSY;  Surgeon: Hollingshead Lamar CHRISTELLA, MD;  [facility];  Service: Endoscopy;;   CLOSED MANIPULATION SHOULDER     COLONOSCOPY  11/2015   Dr. Tobie: cecal bx neg for microscopic colitis. ascending colon polyp (adenomatous)   COLONOSCOPY WITH PROPOFOL  N/A 07/31/2021   Procedure: COLONOSCOPY WITH PROPOFOL ;  Surgeon: Hollingshead Lamar CHRISTELLA, MD;  [facility];  Service: Endoscopy;  Laterality: N/A;  7:30am   ESOPHAGEAL BANDING N/A 08/04/2017   Procedure: ESOPHAGEAL BANDING;  Surgeon: Hollingshead Lamar CHRISTELLA, MD;  [facility];  Service: Endoscopy;  Laterality: N/A;  esophageal varices banding   ESOPHAGEAL BANDING N/A 10/13/2017   Procedure: ESOPHAGEAL BANDING;  Surgeon: Hollingshead Lamar CHRISTELLA, MD;  [facility];  Service: Endoscopy;  Laterality: N/A;   ESOPHAGEAL BANDING N/A 04/09/2020   Procedure: ESOPHAGEAL BANDING;  Surgeon: Hollingshead Lamar CHRISTELLA, MD;  [facility];  Service: Endoscopy;  Laterality:  N/A;   ESOPHAGOGASTRODUODENOSCOPY N/A 07/09/2016   Procedure: ESOPHAGOGASTRODUODENOSCOPY (EGD);  Surgeon: Lamar CHRISTELLA Hollingshead, MD;  [facility];  Service: Endoscopy;  Laterality: N/A;  730   ESOPHAGOGASTRODUODENOSCOPY N/A 08/04/2017   Procedure: ESOPHAGOGASTRODUODENOSCOPY (EGD);  Surgeon: Hollingshead Lamar CHRISTELLA, MD;  [facility];  Service: Endoscopy;  Laterality: N/A;  7:30am   ESOPHAGOGASTRODUODENOSCOPY N/A 10/13/2017   Dr. Hollingshead: grade 2 esophageal varices status post banding, portal hypertensive gastropathy   ESOPHAGOGASTRODUODENOSCOPY   11/23/2019   [name]: Esophageal varices, 4 columns of grade 2-3 status post banding.  Varices more prominent than seen in December 2018.  Portal gastropathy.  Normal-appearing small bowel.   ESOPHAGOGASTRODUODENOSCOPY (EGD) WITH PROPOFOL  N/A 04/09/2020   Dr. Hollingshead: 4 columns of grade 2/grade 3 esophageal varices somewhat recalcitrant.  Status post esophageal band ligation today.  Portal gastropathy.  Normal-appearing small bowel.   ESOPHAGOGASTRODUODENOSCOPY (EGD) WITH PROPOFOL  N/A 07/31/2021   Procedure: ESOPHAGOGASTRODUODENOSCOPY (EGD) WITH PROPOFOL ;  Surgeon: Hollingshead Lamar CHRISTELLA, MD;  [facility];  Service: Endoscopy;  Laterality: N/A;   GOLD SEED IMPLANT N/A 01/15/2022   Procedure: GOLD SEED IMPLANT;  Surgeon: Sherrilee Belvie CROME, MD;  Location: [facility];  Service: Urology;  Laterality: N/A;   LIVER TRANSPLANT  01/16/2023   SPACE OAR INSTILLATION N/A 01/15/2022   Procedure: SPACE OAR INSTILLATION;  Surgeon: Sherrilee Belvie CROME, MD;  Location: [facility];  Service: Urology;  Laterality: N/A;   US  PARACENTESIS  06/10/2022    Family History  Problem Relation Age of Onset   Other Mother        chronic diarrhea   COPD Mother    Heart disease Mother        died at 60   Arthritis Mother    Cancer Mother    Depression Mother    Diabetes Mother    Hyperlipidemia Mother    Hypertension Mother    Stroke Mother    Arthritis Father    Asthma Father    Birth defects Father    Heart disease Father        aortic valve replaced   Heart disease Maternal Grandmother    Stroke Maternal Grandmother    Heart disease Maternal Grandfather    Stroke Maternal Grandfather    Diabetes Paternal Grandmother    Stroke Paternal Grandfather    Heart disease Paternal Grandfather    Liver disease Neg Hx    Colon cancer Neg Hx     Social History   Socioeconomic History   Marital status: Married    Spouse name: Melissa   Number of children: 0   Years of education: 14   Highest education level:  Tax Adviser degree: occupational, scientist, product/process development, or vocational program  Occupational History   Occupation: lexicographer, 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   Has no children.   Eats all food groups.   Works for [employer]       Social Drivers of Health   Financial Resource Strain: Low Risk  (12/07/2023)   Overall Financial Resource Strain (CARDIA)    Difficulty of Paying Living Expenses:  Not hard at all  Food Insecurity: No Food Insecurity (12/07/2023)   Hunger Vital Sign    Worried About Running Out of Food in the Last Year: Never true    Ran Out of Food in the Last Year: Never true  Transportation Needs: No Transportation Needs (12/07/2023)   PRAPARE - Administrator, Civil Service (Medical): No    Lack of Transportation (Non-Medical): No  Physical Activity: Insufficiently Active (12/07/2023)   Exercise Vital Sign    Days of Exercise per Week: 2 days    Minutes of Exercise per Session: 30 min  Stress: Stress Concern Present (12/07/2023)   [employer] of Occupational Health - Occupational Stress Questionnaire    Feeling of Stress : To some extent  Social Connections: Socially Integrated (12/07/2023)   Social Connection and Isolation Panel [NHANES]    Frequency of Communication with Friends and Family: More than three times a week    Frequency of Social Gatherings with Friends and Family: Twice a week    Attends Religious Services: More than 4 times per year    Active Member of [employer] or Organizations: Yes    Attends Engineer, Structural: More than 4 times per year    Marital Status: Married  Catering Manager  Violence: Not At Risk (08/18/2023)   Humiliation, Afraid, Rape, and Kick questionnaire    Fear of Current or Ex-Partner: No    Emotionally Abused: No    Physically Abused: No    Sexually Abused: No    Outpatient Medications Prior to Visit  Medication Sig Dispense Refill   aspirin  EC 81 MG tablet Take by mouth.     Continuous Glucose Sensor (FREESTYLE LIBRE 2 SENSOR) MISC See admin instructions.     gabapentin (NEURONTIN) 300 MG capsule Take 300 mg by mouth daily.     ondansetron  (ZOFRAN -ODT) 4 MG disintegrating tablet Take 4 mg by mouth every 8 (eight) hours as needed.     pantoprazole  (PROTONIX ) 40 MG tablet Take 40 mg by mouth 2 (two) times daily.     sodium polystyrene (KAYEXALATE) powder Take by mouth.     SPS, SODIUM POLYSTYRENE SULF, 15 GM/60ML suspension SMARTSIG:120 Milliliter(s) By Mouth Daily     tacrolimus  (PROGRAF ) 1 MG capsule Take 2-3 mg by mouth 2 (two) times daily. Taking 2 mg in the morning and 3 mg at night     tamsulosin  (FLOMAX ) 0.4 MG CAPS capsule Take 0.4 mg by mouth daily.     metolazone (ZAROXOLYN) 2.5 MG tablet Take by mouth.     mycophenolate  (MYFORTIC ) 180 MG EC tablet Take 180 mg by mouth 2 (two) times daily.     No facility-administered medications prior to visit.    Allergies  Allergen Reactions   Penicillins Other (See Comments)    As a baby, unknown reaction Did it involve swelling of the face/tongue/throat, SOB, or low BP? Unknown Did it involve sudden or severe rash/hives, skin peeling, or any reaction on the inside of your mouth or nose? Unknown Did you need to seek medical attention at a hospital or doctor's office? Unknown When did it last happen?      infant allergy If all above answers are "NO", may proceed with cephalosporin use. .    Gluten Meal Diarrhea   Biaxin [Clarithromycin] Tinitus    Ears ringing, loss sense of smell    ROS Review of Systems  Constitutional:  Positive for fatigue. Negative for chills and fever.  HENT:   Negative  for congestion and sore throat.   Eyes:  Negative for pain and discharge.  Respiratory:  Negative for cough and shortness of breath.   Cardiovascular:  Positive for leg swelling. Negative for chest pain and palpitations.  Gastrointestinal:  Positive for diarrhea. Negative for nausea and vomiting.  Endocrine: Negative for polydipsia and polyuria.  Genitourinary:  Negative for dysuria and hematuria.  Musculoskeletal:  Negative for neck pain and neck stiffness.  Skin:  Negative for rash.  Neurological:  Negative for dizziness, weakness, numbness and headaches.  Psychiatric/Behavioral:  Positive for sleep disturbance. Negative for agitation and behavioral problems.       Objective:    Physical Exam Vitals reviewed.  Constitutional:      General: He is not in acute distress.    Appearance: He is not diaphoretic.  HENT:     Head: Normocephalic and atraumatic.     Nose: Nose normal.     Mouth/Throat:     Mouth: Mucous membranes are moist.  Eyes:     General: No scleral icterus.    Extraocular Movements: Extraocular movements intact.  Cardiovascular:     Rate and Rhythm: Normal rate and regular rhythm.     Heart sounds: Normal heart sounds. No murmur heard. Pulmonary:     Breath sounds: Normal breath sounds. No wheezing or rales.  Musculoskeletal:     Cervical back: Neck supple. No tenderness.     Right lower leg: Edema (1+) present.     Left lower leg: Edema (2+) present.  Skin:    General: Skin is warm.     Findings: No rash.  Neurological:     General: No focal deficit present.     Mental Status: He is alert and oriented to person, place, and time.     Sensory: No sensory deficit.     Motor: Weakness (B/l LE - 4/5) present.  Psychiatric:        Mood and Affect: Mood normal.        Behavior: Behavior normal.     BP 108/69   Pulse 99   Ht 5' 7 (1.702 m)   Wt 174 lb 3.2 oz (79 kg)   SpO2 97%   BMI 27.28 kg/m  Wt Readings from Last 3 Encounters:  12/07/23  174 lb 3.2 oz (79 kg)  11/12/23 171 lb 9.6 oz (77.8 kg)  09/17/23 187 lb 3.2 oz (84.9 kg)    Lab Results  Component Value Date   TSH 3.222 09/24/2023   Lab Results  Component Value Date   WBC 3.8 (L) 11/29/2023   HGB 9.3 (L) 11/29/2023   HCT 29.9 (L) 11/29/2023   MCV 100.3 (H) 11/29/2023   PLT 173 11/29/2023   Lab Results  Component Value Date   NA 135 11/29/2023   K 5.3 (H) 11/29/2023   CO2 22 11/29/2023   GLUCOSE 137 (H) 11/29/2023   BUN 28 (H) 11/29/2023   CREATININE 2.67 (H) 11/29/2023   BILITOT 1.1 11/29/2023   ALKPHOS 141 (H) 11/29/2023   AST 47 (H) 11/29/2023   ALT 30 11/29/2023   PROT 4.6 (L) 11/29/2023   ALBUMIN  2.3 (L) 11/29/2023   CALCIUM  7.5 (L) 11/29/2023   ANIONGAP 6 11/29/2023   Lab Results  Component Value Date   CHOL 60 09/24/2023   Lab Results  Component Value Date   HDL 37 (L) 09/24/2023   Lab Results  Component Value Date   LDLCALC 18 09/24/2023   Lab Results  Component Value Date  TRIG 27 09/24/2023   Lab Results  Component Value Date   CHOLHDL 1.6 09/24/2023   Lab Results  Component Value Date   HGBA1C 4.2 (L) 09/17/2023      Assessment & Plan:   Problem List Items Addressed This Visit       Cardiovascular and Mediastinum   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)   Noted on last EGD at [facility] Followed by GI - has been placed on Coreg , but held due to hypotension Gets surveillance EGD at [facility] health        Endocrine   Type 2 diabetes mellitus with other specified complication (HCC)   HbA1c: 4.2  Associated with HLD Was on NovoLog  ISS, followed by Endocrinology in [facility] endocrinology - has not required insulin  since liver transplant Advised to follow diabetic diet Not on statin due to liver cirrhosis Recent CMP reviewed from chart Diabetic eye exam: Advised to follow up with Ophthalmology for diabetic eye exam        Genitourinary   Chronic kidney disease, stage 3b (HCC)   Last CMP showed GFR of  27, had AKI on CKD, likely due to dehydration Followed by Nephrology at [facility] -undergoing evaluation for renal transplant Avoid nephrotoxic agents        Other   S/P liver transplant (HCC)   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/23 for 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 Followed by [facility] transplant team      Immunosuppressed status (HCC)   On tacrolimus  CellCept stopped recently On Bactrim MWF and valacyclovir  for prophylaxis Followed by [facility] transplant team      Hyperkalemia   Chronic, likely due to CKD Continue Veltassa      Edema due to hypoalbuminemia   Has recent worsening of leg swelling due to hypoalbuminemia, history of liver cirrhosis Unable to take regular protein supplement due to hypotension content-recently had hospitalization for hyperkalemia Has been advised to take Nepro supplement, but does not tolerate it, has nausea from it Continue high protein diet      Physical deconditioning - Primary   Has bilateral lower extremity weakness likely due to multiple factors, including failure to thrive, immunosuppressed state, CKD and chronic diarrhea Has been getting home PT, but would benefit more from outpatient physical therapy at this point - referred to [facility] in [place] Continue high-protein diet      Relevant Orders   Ambulatory referral to Physical Therapy   Recurrent falls   Likely due to orthostatic hypotension from dehydration Needs to maintain adequate hydration and high-protein diet Needs to take protein supplement      Relevant Orders   Ambulatory referral to Physical Therapy    No orders of the defined types were placed in this encounter.   Follow-up: Return if symptoms worsen or fail to improve.    Suzzane MARLA Blanch, MD

## 2023-12-07 NOTE — Assessment & Plan Note (Signed)
 Has recent worsening of leg swelling due to hypoalbuminemia, history of liver cirrhosis Unable to take regular protein supplement due to hypotension content-recently had hospitalization for hyperkalemia Has been advised to take Nepro supplement, but does not tolerate it, has nausea from it Continue high protein diet

## 2023-12-07 NOTE — Assessment & Plan Note (Signed)
Likely due to orthostatic hypotension from dehydration Needs to maintain adequate hydration and high-protein diet Needs to take protein supplement

## 2023-12-07 NOTE — Assessment & Plan Note (Signed)
 HbA1c: 4.2  Associated with HLD Was on NovoLog  ISS, followed by Endocrinology in [facility] endocrinology - has not required insulin  since liver transplant Advised to follow diabetic diet Not on statin due to liver cirrhosis Recent CMP reviewed from chart Diabetic eye exam: Advised to follow up with Ophthalmology for diabetic eye exam

## 2023-12-07 NOTE — Assessment & Plan Note (Signed)
Chronic, likely due to CKD Continue Veltassa

## 2023-12-07 NOTE — Assessment & Plan Note (Signed)
 Has bilateral lower extremity weakness likely due to multiple factors, including failure to thrive, immunosuppressed state, CKD and chronic diarrhea Has been getting home PT, but would benefit more from outpatient physical therapy at this point - referred to [facility] in [place] Continue high-protein diet

## 2023-12-07 NOTE — Assessment & Plan Note (Signed)
Noted on last EGD at [facility] Followed by GI - has been placed on Coreg, but held due to hypotension Gets surveillance EGD at [facility] health

## 2023-12-07 NOTE — Assessment & Plan Note (Signed)
Last CMP showed GFR of 27, had AKI on CKD, likely due to dehydration Followed by Nephrology at [facility] -undergoing evaluation for renal transplant Avoid nephrotoxic agents

## 2023-12-07 NOTE — Assessment & Plan Note (Signed)
On tacrolimus CellCept stopped recently On Bactrim MWF and valacyclovir for prophylaxis Followed by [facility] transplant team

## 2023-12-08 ENCOUNTER — Inpatient Hospital Stay: Payer: BC Managed Care – PPO

## 2023-12-08 ENCOUNTER — Inpatient Hospital Stay: Payer: BC Managed Care – PPO | Attending: Physician Assistant

## 2023-12-08 VITALS — BP 124/63 | HR 75 | Temp 98.6°F | Resp 19

## 2023-12-08 DIAGNOSIS — C61 Malignant neoplasm of prostate: Secondary | ICD-10-CM | POA: Diagnosis not present

## 2023-12-08 DIAGNOSIS — K7460 Unspecified cirrhosis of liver: Secondary | ICD-10-CM | POA: Diagnosis not present

## 2023-12-08 DIAGNOSIS — D696 Thrombocytopenia, unspecified: Secondary | ICD-10-CM | POA: Diagnosis not present

## 2023-12-08 DIAGNOSIS — D61818 Other pancytopenia: Secondary | ICD-10-CM | POA: Diagnosis not present

## 2023-12-08 DIAGNOSIS — D509 Iron deficiency anemia, unspecified: Secondary | ICD-10-CM | POA: Diagnosis not present

## 2023-12-08 DIAGNOSIS — N184 Chronic kidney disease, stage 4 (severe): Secondary | ICD-10-CM | POA: Insufficient documentation

## 2023-12-08 DIAGNOSIS — D631 Anemia in chronic kidney disease: Secondary | ICD-10-CM | POA: Insufficient documentation

## 2023-12-08 DIAGNOSIS — Z944 Liver transplant status: Secondary | ICD-10-CM | POA: Diagnosis not present

## 2023-12-08 DIAGNOSIS — E538 Deficiency of other specified B group vitamins: Secondary | ICD-10-CM

## 2023-12-08 DIAGNOSIS — R161 Splenomegaly, not elsewhere classified: Secondary | ICD-10-CM | POA: Insufficient documentation

## 2023-12-08 DIAGNOSIS — E611 Iron deficiency: Secondary | ICD-10-CM | POA: Insufficient documentation

## 2023-12-08 DIAGNOSIS — R7989 Other specified abnormal findings of blood chemistry: Secondary | ICD-10-CM | POA: Insufficient documentation

## 2023-12-08 LAB — COMPREHENSIVE METABOLIC PANEL
ALT: 23 U/L (ref 0–44)
AST: 29 U/L (ref 15–41)
Albumin: 2.3 g/dL — ABNORMAL LOW (ref 3.5–5.0)
Alkaline Phosphatase: 114 U/L (ref 38–126)
Anion gap: 9 (ref 5–15)
BUN: 22 mg/dL (ref 8–23)
CO2: 18 mmol/L — ABNORMAL LOW (ref 22–32)
Calcium: 7.5 mg/dL — ABNORMAL LOW (ref 8.9–10.3)
Chloride: 111 mmol/L (ref 98–111)
Creatinine, Ser: 3.73 mg/dL — ABNORMAL HIGH (ref 0.61–1.24)
GFR, Estimated: 17 mL/min — ABNORMAL LOW (ref >60)
Glucose, Bld: 131 mg/dL — ABNORMAL HIGH (ref 70–99)
Potassium: 5.1 mmol/L (ref 3.5–5.1)
Sodium: 138 mmol/L (ref 135–145)
Total Bilirubin: 0.9 mg/dL (ref 0.0–1.2)
Total Protein: 4.7 g/dL — ABNORMAL LOW (ref 6.5–8.1)

## 2023-12-08 LAB — IRON AND TIBC: Iron: 63 ug/dL (ref 45–182)

## 2023-12-08 LAB — CBC WITH DIFFERENTIAL/PLATELET
Abs Immature Granulocytes: 0.03 10*3/uL (ref 0.00–0.07)
Basophils Absolute: 0.1 10*3/uL (ref 0.0–0.1)
Basophils Relative: 1 %
Eosinophils Absolute: 0.2 10*3/uL (ref 0.0–0.5)
Eosinophils Relative: 4 %
HCT: 29.8 % — ABNORMAL LOW (ref 39.0–52.0)
Hemoglobin: 9.6 g/dL — ABNORMAL LOW (ref 13.0–17.0)
Immature Granulocytes: 1 %
Lymphocytes Relative: 26 %
Lymphs Abs: 1.2 10*3/uL (ref 0.7–4.0)
MCH: 32.2 pg (ref 26.0–34.0)
MCHC: 32.2 g/dL (ref 30.0–36.0)
MCV: 100 fL (ref 80.0–100.0)
Monocytes Absolute: 0.4 10*3/uL (ref 0.1–1.0)
Monocytes Relative: 9 %
Neutro Abs: 2.7 10*3/uL (ref 1.7–7.7)
Neutrophils Relative %: 59 %
Platelets: 230 10*3/uL (ref 150–400)
RBC: 2.98 MIL/uL — ABNORMAL LOW (ref 4.22–5.81)
RDW: 15.2 % (ref 11.5–15.5)
WBC: 4.5 10*3/uL (ref 4.0–10.5)
nRBC: 0 % (ref 0.0–0.2)

## 2023-12-08 LAB — VITAMIN B12: Vitamin B-12: 1236 pg/mL — ABNORMAL HIGH (ref 180–914)

## 2023-12-08 LAB — FERRITIN: Ferritin: 327 ng/mL (ref 24–336)

## 2023-12-08 MED ORDER — EPOETIN ALFA-EPBX 10000 UNIT/ML IJ SOLN
10000.0000 [IU] | Freq: Once | INTRAMUSCULAR | Status: AC
Start: 2023-12-08 — End: 2023-12-08
  Administered 2023-12-08: 10000 [IU] via SUBCUTANEOUS
  Filled 2023-12-08: qty 1

## 2023-12-08 NOTE — Progress Notes (Signed)
 Hemoglobin today is 9.6.  We will proceed with Retacrit injection per provider orders.   Patient tolerated injection with no complaints voiced.  Site clean and dry with no bruising or swelling noted.  No complaints of pain.  Discharged with vital signs stable and no signs or symptoms of distress noted.

## 2023-12-08 NOTE — Patient Instructions (Signed)
 [facility] [place] - A DEPT OF [facility]  Discharge Instructions: Thank you for choosing [facility] to provide your oncology and hematology care.  If you have a lab appointment with the Cancer Center - please note that after April 8th, 2024, all labs will be drawn in the cancer center.  You do not have to check in or register with the main entrance as you have in the past but will complete your check-in in the cancer center.  Wear comfortable clothing and clothing appropriate for easy access to any Portacath or PICC line.   We strive to give you quality time with your provider. You may need to reschedule your appointment if you arrive late (15 or more minutes).  Arriving late affects you and other patients whose appointments are after yours.  Also, if you miss three or more appointments without notifying the office, you may be dismissed from the clinic at the provider's discretion.      For prescription refill requests, have your pharmacy contact our office and allow 72 hours for refills to be completed.    Today you received the following :  Retacrit.  Epoetin Alfa Injection What is this medication? EPOETIN ALFA (e POE e tin AL fa) treats low levels of red blood cells (anemia) caused by kidney disease, chemotherapy, or HIV medications. It can also be used in people who are at risk for blood loss during surgery. It works by Systems analyst make more red blood cells, which reduces the need for blood transfusions. This medicine may be used for other purposes; ask your health care provider or pharmacist if you have questions. COMMON BRAND NAME(S): Epogen, Procrit, Retacrit What should I tell my care team before I take this medication? They need to know if you have any of these conditions: Blood clots Cancer Heart disease High blood pressure On dialysis Seizures Stroke An unusual or allergic reaction to epoetin alfa, albumin, benzyl alcohol, other  medications, foods, dyes, or preservatives Pregnant or trying to get pregnant Breast-feeding How should I use this medication? This medication is injected into a vein or under the skin. It is usually given by your care team in a hospital or clinic setting. It may also be given at home. If you get this medication at home, you will be taught how to prepare and give it. Use exactly as directed. Take it as directed on the prescription label at the same time every day. Keep taking it unless your care team tells you to stop. It is important that you put your used needles and syringes in a special sharps container. Do not put them in a trash can. If you do not have a sharps container, call your pharmacist or care team to get one. A special MedGuide will be given to you by the pharmacist with each prescription and refill. Be sure to read this information carefully each time. Talk to your care team about the use of this medication in children. While this medication may be used in children as young as 1 month of age for selected conditions, precautions do apply. Overdosage: If you think you have taken too much of this medicine contact a poison control center or emergency room at once. NOTE: This medicine is only for you. Do not share this medicine with others. What if I miss a dose? If you miss a dose, take it as soon as you can. If it is almost time for your  next dose, take only that dose. Do not take double or extra doses. What may interact with this medication? Darbepoetin alfa Methoxy polyethylene glycol-epoetin beta This list may not describe all possible interactions. Give your health care provider a list of all the medicines, herbs, non-prescription drugs, or dietary supplements you use. Also tell them if you smoke, drink alcohol, or use illegal drugs. Some items may interact with your medicine. What should I watch for while using this medication? Visit your care team for regular checks on your  progress. Check your blood pressure as directed. Know what your blood pressure should be and when to contact your care team. Your condition will be monitored carefully while you are receiving this medication. You may need blood work while taking this medication. What side effects may I notice from receiving this medication? Side effects that you should report to your care team as soon as possible: Allergic reactions--skin rash, itching, hives, swelling of the face, lips, tongue, or throat Blood clot--pain, swelling, or warmth in the leg, shortness of breath, chest pain Heart attack--pain or tightness in the chest, shoulders, arms, or jaw, nausea, shortness of breath, cold or clammy skin, feeling faint or lightheaded Increase in blood pressure Rash, fever, and swollen lymph nodes Redness, blistering, peeling, or loosening of the skin, including inside the mouth Seizures Stroke--sudden numbness or weakness of the face, arm, or leg, trouble speaking, confusion, trouble walking, loss of balance or coordination, dizziness, severe headache, change in vision Side effects that usually do not require medical attention (report to your care team if they continue or are bothersome): Bone, joint, or muscle pain Cough Headache Nausea Pain, redness, or irritation at injection site This list may not describe all possible side effects. Call your doctor for medical advice about side effects. You may report side effects to FDA at 1-800-FDA-1088. Where should I keep my medication? Keep out of the reach of children and pets. Store in a refrigerator. Do not freeze. Do not shake. Protect from light. Keep this medication in the original container until you are ready to take it. See product for storage information. Get rid of any unused medication after the expiration date. To get rid of medications that are no longer needed or have expired: Take the medication to a medication take-back program. Check with your  pharmacy or law enforcement to find a location. If you cannot return the medication, ask your pharmacist or care team how to get rid of the medication safely. NOTE: This sheet is a summary. It may not cover all possible information. If you have questions about this medicine, talk to your doctor, pharmacist, or health care provider.  2024 Elsevier/Gold Standard (2022-02-20 00:00:00)    To help prevent nausea and vomiting after your treatment, we encourage you to take your nausea medication as directed.  BELOW ARE SYMPTOMS THAT SHOULD BE REPORTED IMMEDIATELY: *FEVER GREATER THAN 100.4 F (38 C) OR HIGHER *CHILLS OR SWEATING *NAUSEA AND VOMITING THAT IS NOT CONTROLLED WITH YOUR NAUSEA MEDICATION *UNUSUAL SHORTNESS OF BREATH *UNUSUAL BRUISING OR BLEEDING *URINARY PROBLEMS (pain or burning when urinating, or frequent urination) *BOWEL PROBLEMS (unusual diarrhea, constipation, pain near the anus) TENDERNESS IN MOUTH AND THROAT WITH OR WITHOUT PRESENCE OF ULCERS (sore throat, sores in mouth, or a toothache) UNUSUAL RASH, SWELLING OR PAIN  UNUSUAL VAGINAL DISCHARGE OR ITCHING   Items with * indicate a potential emergency and should be followed up as soon as possible or go to the Emergency Department if any problems should  occur.  Please show the CHEMOTHERAPY ALERT CARD or IMMUNOTHERAPY ALERT CARD at check-in to the Emergency Department and triage nurse.  Should you have questions after your visit or need to cancel or reschedule your appointment, please contact [facility] [place] - A DEPT OF [facility] [phone]  and follow the prompts.  Office hours are 8:00 a.m. to 4:30 p.m. Monday - Friday. Please note that voicemails left after 4:00 p.m. may not be returned until the following business day.  We are closed weekends and major holidays. You have access to a nurse at all times for urgent questions. Please call the main number to the clinic [phone] and follow the  prompts.  For any non-urgent questions, you may also contact your provider using MyChart. We now offer e-Visits for anyone 73 and older to request care online for non-urgent symptoms. For details visit [email or web address].   Also download the MyChart app! Go to the app store, search "MyChart", open the app, select [place], and log in with your MyChart username and password.

## 2023-12-12 LAB — METHYLMALONIC ACID, SERUM: Methylmalonic Acid, Quantitative: 261 nmol/L (ref 0–378)

## 2023-12-14 NOTE — Progress Notes (Unsigned)
[facility] [address], [place] [address]   CLINIC:  Medical Oncology/Hematology  PCP:  [name], MD [address] 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  He was last seen by Rojelio Brenner PA-C on 09/15/2023.  Since last visit, he has been hospitalized twice. 09/24/2023 through 09/26/2023 at [facility] for volume overload, hyperkalemia, and acute on chronic kidney injury. Hospitalized 11/23/2023 through 11/27/2023 at [facility] for acute on chronic renal failure and failure to thrive.  (Hgb dropped to 8.6 during hospitalization, but has recovered since that time.) He has had worsening kidney failure since his last visit, and is being considered for renal transplant list.  ***  *** Patient reports increased fatigue, fluid overload, extremity weakness, and some cognitive deficits over the past several months.  *** ***At today's visit, he reports feeling *** poorly, secondary to the above. *** He is tolerating Retacrit well.  He reports blood pressure at home has been within normal limits, and slightly below what he has been previously; BP in clinic today is ***. ***   He denies any current symptoms concerning for DVT or PE - he has had some extremity swelling, but US performed at [facility] has been negative for DVT.  His energy is low for the past month.  *** *** He continues to bruise easily, but notes that he has been on aspirin s/p transplant. *** He denies any obvious bleeding such as epistaxis, hematochezia, melena, or  hematuria. *** He denies any new lumps or bumps. *** He has not had any recent infections. *** He denies any B symptoms. *** He is taking his Vitamin B12 500 mcg daily.  He has *** energy and ***% appetite. He is starting to regain some weight after his transplant surgery.  ASSESSMENT & PLAN:  1.  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 - CT scan (11/14/2021 at [facility]) showed enlarged spleen measuring 17 cm - Work-up negative for other causes of thrombocytopenia - normal B12, methylmalonic acid, folate, copper; negative rheumatoid factor/ANA; no abnormal platelets noted on pathology smear review - Received platelet transfusion x1 on 08/18/2021 prior to prostate biopsy.  Blood transfusion x1 on 01/15/2022 prior to prostate SpaceOAR placement is - Bone marrow biopsy (02/10/2022) shows hypercellular marrow for age with trilineage lineage hematopoiesis.  No increase in blasts.  No evidence of metastatic carcinoma. - Liver transplant at [facility] on 01/16/2023, on Myfortic which may have been contributing to his neutropenia (dose reduced on 05/31/2023 due to neutropenia) - Most recent labs (12/08/2023): Platelets 230, WBC 4.5/normal differential*** - DIFFERENTIAL DIAGNOSIS: Thrombocytopenia and leukopenia likely secondary to cirrhosis (s/p liver transplant 01/16/2023) and splenic sequestration.  Unclear if patient has any underlying ITP in addition to this. Discussed with patient that spleen size may not fully returned to normal after liver transplantation, and therefore  platelet count may or may not fully return to normal - PLAN: Platelets are improved following liver transplantation.  No indication for treatment at this time.  We will continue to monitor with periodic CBCs. - Patient's Myfortic dose was recently decreased in half (as of 05/31/2023) by transplant team due to neutropenia.   2.  Normocytic anemia, secondary to CKD + iron deficiency  - Bone marrow biopsy (02/10/2022) shows hypercellular marrow for age with trilineage lineage hematopoiesis.  No increase in blasts.  No evidence of metastatic carcinoma. - EGDs at [facility] in April 2023 in October 2023 showed esophageal varices (s/p banding), old blood in the stomach, and portal hypertensive gastropathy  - Other work-up (08/18/2021) showed normal reticulocytes; normal B12, methylmalonic acid, folate, and copper.  Immunofixation and SPEP negative.  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 Jena Gauss - He received IV Venofer x1000 mg, last dose given 09/03/2021 - Reports PRBC transfusion x2 during hospitalization at [facility] in June/July 2023 - Retacrit 10,000 units every 2 weeks started 10/22/2022 (interrupted due to liver transplant 01/16/2023).  Retacrit restarted on 04/14/2023.  Current dose is 10,000 units every 4 weeks. - Most recent labs (12/08/2023):  Hgb 9.6/MCV 100.0 Vitamin B12 elevated at 1236, MMA normal Creatinine 3.73/GFR 17 (CKD stage IV) - following with [facility] nephrology, and aware of increased creatinine, considering possible renal transplant *** Ferritin 327, iron saturation not calculated - He denies any gross blood loss such as epistaxis, rectal bleeding, melena, or hematuria.*** - Reports severe fatigue related to other comorbidities.  *** No pica, chest pain, or difficulty breathing. - DIFFERENTIAL DIAGNOSIS: Anemia secondary to CKD, further complicated by malabsorption (celiac  disease), chronic GI bleeding - PLAN: No indication for IV iron at this time.  No indication to restart B12 supplement. - We will INCREASE frequency of Retacrit to 10,000 units every 2 weeks - Labs (CBC/D, CMP, ferritin, iron/TIBC, B12, MMA) in 3 months with OFFICE visit 1 week after labs  3.  Cirrhosis with esophageal varices - S/P LIVER TRANSPLANT 01/16/2023 - Diagnosed in 2017 - Patient had EGD/colonoscopy on 07/31/2021 which showed portal colopathy as well as gastric erosions and grade 3 varices, which were not banded due to low platelet count - EGD (12/26/2021 via [facility]): Grade 2 esophageal varices, banded x3; old blood noted in stomach; mild portal hypertensive gastropathy - Patient follows with hepatology at [facility] - Liver transplant at [facility] on 01/16/2023 NOTE: Patient's post-transplant nurse coordinator at [facility] is [facility]  [phone])    4.  Stage T1c adenocarcinoma of the prostate, Gleason 3+4   - Diagnosed in October 2022 with prostate biopsy after abnormal PSA (8.2) - Following with urologist (Dr. Mena Goes / Dr. Ronne Binning) and has also been seen by radiation oncology (Ashlyn Bruning PA-C / Dr. Margaretmary Dys) - He had SpaceOAR placement on 01/15/2022 - Patient is undergoing XRT in [place], which was completed on 03/13/2022. - He is receiving Lupron shots and following with urology and radiation oncology  PLAN SUMMARY: >> CBC + Retacrit every 2 weeks >> Labs in 3 months = CBC/D, CMP, ferritin, iron/TIBC, B12, MMA*** >> OFFICE visit in 3 months (1 week after lab panel)***  **PLEASE NOTE that Retacrit should be given same day as labs,  not necessarily same day as office visit***     REVIEW OF SYSTEMS: ***  Review of Systems  Constitutional:  Positive for fatigue. Negative for appetite change, chills, diaphoresis, fever and unexpected weight change.  HENT:   Negative for lump/mass and nosebleeds.   Eyes:  Negative for eye problems.  Respiratory:  Negative for cough, hemoptysis  and shortness of breath.   Cardiovascular:  Positive for leg swelling. Negative for chest pain and palpitations.  Gastrointestinal:  Positive for diarrhea. Negative for abdominal pain, blood in stool, constipation, nausea and vomiting.  Genitourinary:  Positive for difficulty urinating and frequency (Incomplete bladder emptying). Negative for hematuria.   Skin: Negative.   Neurological:  Positive for dizziness and headaches. Negative for light-headedness.  Hematological:  Does not bruise/bleed easily.  Psychiatric/Behavioral:  Positive for decreased concentration (Forgetful) and sleep disturbance.      PHYSICAL EXAM:***  ECOG PERFORMANCE STATUS: 1 - Symptomatic but completely ambulatory  There were no vitals filed for this visit.  There were no vitals filed for this visit.  Physical Exam Constitutional:      Appearance: Normal appearance. He is obese.  Cardiovascular:     Rate and Rhythm: Bradycardia present.     Heart sounds: Normal heart sounds.     Comments: HR 58 Pulmonary:     Breath sounds: Normal breath sounds.  Musculoskeletal:     Right lower leg: Edema present.     Left lower leg: Edema present.  Neurological:     General: No focal deficit present.     Mental Status: Mental status is at baseline.  Psychiatric:        Behavior: Behavior normal. Behavior is cooperative.     PAST MEDICAL/SURGICAL HISTORY:  Past Medical History:  Diagnosis Date   Allergy    nose and sinus problems   Anemia    Anemia in chronic kidney disease (CKD) 11/23/2022   Asthma    Celiac disease    Cirrhosis, cryptogenic (HCC)    completed Hep A and B vaccines   DM (diabetes mellitus) (HCC)    Encounter for general adult medical examination with abnormal findings 09/08/2022   GERD (gastroesophageal reflux disease)    Hyperlipidemia    diet controlled now with 100 pound weight loss   Hypertension    Iron deficiency anemia due to chronic blood loss 08/13/2021   Malignant neoplasm of  prostate (HCC) 10/03/2021   Murmur, heart 03/02/2022   Narrow angle glaucoma suspect of both eyes    Renal cyst 09/03/2017   right   Situational depression 07/06/2017   Splenomegaly    Thrombocytopenia (HCC)    hematology, ?related to chol med (tricor) and glimeripide, just being monitored, above 100,000   Past Surgical History:  Procedure Laterality Date   BIOPSY  07/09/2016   Procedure: BIOPSY;  Surgeon: Corbin Ade, MD;  [facility];  Service: Endoscopy;;  duodenal, gastric, esophaageal   BIOPSY  07/31/2021   Procedure: BIOPSY;  Surgeon: Corbin Ade, MD;  [facility];  Service: Endoscopy;;   CLOSED MANIPULATION SHOULDER     COLONOSCOPY  11/2015   Dr. Allena Katz: cecal bx neg for microscopic colitis. ascending colon polyp (adenomatous)   COLONOSCOPY WITH PROPOFOL N/A 07/31/2021   Procedure: COLONOSCOPY WITH PROPOFOL;  Surgeon: Corbin Ade, MD;  [facility];  Service: Endoscopy;  Laterality: N/A;  7:30am   ESOPHAGEAL BANDING N/A 08/04/2017   Procedure: ESOPHAGEAL BANDING;  Surgeon: Corbin Ade, MD;  [facility];  Service: Endoscopy;  Laterality: N/A;  esophageal varices banding   ESOPHAGEAL BANDING N/A 10/13/2017   Procedure: ESOPHAGEAL BANDING;  Surgeon: Corbin Ade, MD;  [facility];  Service: Endoscopy;  Laterality: N/A;   ESOPHAGEAL BANDING N/A 04/09/2020   Procedure: ESOPHAGEAL BANDING;  Surgeon: Corbin Ade, MD;  [facility];  Service: Endoscopy;  Laterality: N/A;   ESOPHAGOGASTRODUODENOSCOPY N/A 07/09/2016   Procedure: ESOPHAGOGASTRODUODENOSCOPY (EGD);  Surgeon: Corbin Ade, MD;  [facility];  Service: Endoscopy;  Laterality: N/A;  730   ESOPHAGOGASTRODUODENOSCOPY N/A 08/04/2017   Procedure: ESOPHAGOGASTRODUODENOSCOPY (EGD);  Surgeon: Corbin Ade, MD;  [facility];  Service: Endoscopy;  Laterality: N/A;  7:30am   ESOPHAGOGASTRODUODENOSCOPY N/A 10/13/2017   Dr. Jena Gauss:  grade 2 esophageal varices status post banding, portal hypertensive gastropathy   ESOPHAGOGASTRODUODENOSCOPY  11/23/2019   [name]: Esophageal varices, 4 columns of grade 2-3 status post banding.  Varices more prominent than seen in December 2018.  Portal gastropathy.  Normal-appearing small bowel.   ESOPHAGOGASTRODUODENOSCOPY (EGD) WITH PROPOFOL N/A 04/09/2020   Dr. Jena Gauss: 4 columns of grade 2/grade 3 esophageal varices somewhat recalcitrant.  Status post esophageal band ligation today.  Portal gastropathy.  Normal-appearing small bowel.   ESOPHAGOGASTRODUODENOSCOPY (EGD) WITH PROPOFOL N/A 07/31/2021   Procedure: ESOPHAGOGASTRODUODENOSCOPY (EGD) WITH PROPOFOL;  Surgeon: Corbin Ade, MD;  [facility];  Service: Endoscopy;  Laterality: N/A;   GOLD SEED IMPLANT N/A 01/15/2022   Procedure: GOLD SEED IMPLANT;  Surgeon: Malen Gauze, MD;  Location: [facility];  Service: Urology;  Laterality: N/A;   LIVER TRANSPLANT  01/16/2023   SPACE OAR INSTILLATION N/A 01/15/2022   Procedure: SPACE OAR INSTILLATION;  Surgeon: Malen Gauze, MD;  Location: [facility];  Service: Urology;  Laterality: N/A;   US PARACENTESIS  06/10/2022    SOCIAL HISTORY:  Social History   Socioeconomic History   Marital status: Married    Spouse name: Melissa   Number of children: 0   Years of education: 14   Highest education level: Associate degree: occupational, Scientist, product/process development, or vocational program  Occupational History   Occupation: Lexicographer, 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   Has no children.   Eats all food groups.   Works for [employer]        Social Drivers of Corporate investment banker Strain: Low Risk  (12/07/2023)   Overall Financial Resource Strain (CARDIA)    Difficulty of Paying Living Expenses: Not hard at all  Food Insecurity: No Food Insecurity (12/07/2023)   Hunger Vital Sign    Worried About Running Out of Food in the Last Year: Never true    Ran Out of Food in the Last Year: Never true  Transportation Needs: No Transportation Needs (12/07/2023)   PRAPARE - Administrator, Civil Service (Medical): No    Lack of Transportation (Non-Medical): No  Physical Activity: Insufficiently Active (12/07/2023)   Exercise Vital Sign    Days of Exercise per Week: 2 days    Minutes  of Exercise per Session: 30 min  Stress: Stress Concern Present (12/07/2023)   [employer] of Occupational Health - Occupational Stress Questionnaire    Feeling of Stress : To some extent  Social Connections: Socially Integrated (12/07/2023)   Social Connection and Isolation Panel [NHANES]    Frequency of Communication with Friends and Family: More than three times a week    Frequency of Social Gatherings with Friends and Family: Twice a week    Attends Religious Services: More than 4 times per year    Active Member of [employer] or Organizations: Yes    Attends Engineer, structural: More than 4 times per year    Marital Status: Married  Catering manager Violence: Not At Risk (08/18/2023)   Humiliation, Afraid, Rape, and Kick questionnaire    Fear of Current or Ex-Partner: No    Emotionally Abused: No    Physically Abused: No    Sexually Abused: No    FAMILY HISTORY:  Family History  Problem Relation Age of Onset   Other Mother        chronic diarrhea   COPD Mother    Heart disease Mother        died at 102   Arthritis Mother    Cancer Mother    Depression Mother    Diabetes Mother    Hyperlipidemia Mother    Hypertension Mother    Stroke Mother    Arthritis Father    Asthma Father    Birth defects Father    Heart  disease Father        aortic valve replaced   Heart disease Maternal Grandmother    Stroke Maternal Grandmother    Heart disease Maternal Grandfather    Stroke Maternal Grandfather    Diabetes Paternal Grandmother    Stroke Paternal Grandfather    Heart disease Paternal Grandfather    Liver disease Neg Hx    Colon cancer Neg Hx     CURRENT MEDICATIONS:  Outpatient Encounter Medications as of 12/15/2023  Medication Sig   aspirin EC 81 MG tablet Take by mouth.   Continuous Glucose Sensor (FREESTYLE LIBRE 2 SENSOR) MISC See admin instructions.   gabapentin (NEURONTIN) 300 MG capsule Take 300 mg by mouth daily.   ondansetron (ZOFRAN-ODT) 4 MG disintegrating tablet Take 4 mg by mouth every 8 (eight) hours as needed.   pantoprazole (PROTONIX) 40 MG tablet Take 40 mg by mouth 2 (two) times daily.   sodium polystyrene (KAYEXALATE) powder Take by mouth.   SPS, SODIUM POLYSTYRENE SULF, 15 GM/60ML suspension SMARTSIG:120 Milliliter(s) By Mouth Daily   tacrolimus (PROGRAF) 1 MG capsule Take 2-3 mg by mouth 2 (two) times daily. Taking 2 mg in the morning and 3 mg at night   tamsulosin (FLOMAX) 0.4 MG CAPS capsule Take 0.4 mg by mouth daily.   No facility-administered encounter medications on file as of 12/15/2023.    ALLERGIES:  Allergies  Allergen Reactions   Penicillins Other (See Comments)    As a baby, unknown reaction Did it involve swelling of the face/tongue/throat, SOB, or low BP? Unknown Did it involve sudden or severe rash/hives, skin peeling, or any reaction on the inside of your mouth or nose? Unknown Did you need to seek medical attention at a hospital or doctor's office? Unknown When did it last happen?      infant allergy If all above answers are "NO", may proceed with cephalosporin use. .    Gluten Meal Diarrhea   Biaxin [Clarithromycin]  Tinitus    Ears ringing, loss sense of smell    LABORATORY DATA:  I have reviewed the labs as listed.  CBC    Component Value  Date/Time   WBC 4.5 12/08/2023 0941   RBC 2.98 (L) 12/08/2023 0941   HGB 9.6 (L) 12/08/2023 0941   HGB 11.1 (L) 09/02/2021 1655   HCT 29.8 (L) 12/08/2023 0941   HCT 32.9 (L) 09/02/2021 1655   PLT 230 12/08/2023 0941   PLT 43 (LL) 09/02/2021 1655   MCV 100.0 12/08/2023 0941   MCV 96 09/02/2021 1655   MCH 32.2 12/08/2023 0941   MCHC 32.2 12/08/2023 0941   RDW 15.2 12/08/2023 0941   RDW 14.0 09/02/2021 1655   LYMPHSABS 1.2 12/08/2023 0941   LYMPHSABS 1.2 09/02/2021 1655   MONOABS 0.4 12/08/2023 0941   EOSABS 0.2 12/08/2023 0941   EOSABS 0.3 09/02/2021 1655   BASOSABS 0.1 12/08/2023 0941   BASOSABS 0.0 09/02/2021 1655      Latest Ref Rng & Units 12/08/2023    9:41 AM 11/29/2023   10:05 AM 11/23/2023   10:10 AM  CMP  Glucose 70 - 99 mg/dL 161  096  99   BUN 8 - 23 mg/dL 22  28  37   Creatinine 0.61 - 1.24 mg/dL 0.45  4.09  8.11   Sodium 135 - 145 mmol/L 138  135  138   Potassium 3.5 - 5.1 mmol/L 5.1  5.3  4.3   Chloride 98 - 111 mmol/L 111  107  107   CO2 22 - 32 mmol/L 18  22  19    Calcium 8.9 - 10.3 mg/dL 7.5  7.5  7.4   Total Protein 6.5 - 8.1 g/dL 4.7  4.6  4.4   Total Bilirubin 0.0 - 1.2 mg/dL 0.9  1.1  1.2   Alkaline Phos 38 - 126 U/L 114  141  167   AST 15 - 41 U/L 29  47  37   ALT 0 - 44 U/L 23  30  28      DIAGNOSTIC IMAGING:  I have independently reviewed the relevant imaging and discussed with the patient.   WRAP UP:  All questions were answered. The patient knows to call the clinic with any problems, questions or concerns.  Medical decision making: Moderate***  Time spent on visit: I spent *** minutes counseling the patient face to face. The total time spent in the appointment was *** minutes and more than 50% was on counseling.  [name], PA-C  ***

## 2023-12-15 ENCOUNTER — Inpatient Hospital Stay (HOSPITAL_BASED_OUTPATIENT_CLINIC_OR_DEPARTMENT_OTHER): Payer: BC Managed Care – PPO | Admitting: Physician Assistant

## 2023-12-15 ENCOUNTER — Other Ambulatory Visit (HOSPITAL_COMMUNITY)
Admission: RE | Admit: 2023-12-15 | Discharge: 2023-12-15 | Disposition: A | Discharge status: Home / Self Care | Payer: BC Managed Care – PPO | Source: Ambulatory Visit | Attending: Gastroenterology | Admitting: Gastroenterology

## 2023-12-15 VITALS — BP 125/63 | HR 84 | Temp 98.2°F | Resp 19 | Wt 175.0 lb

## 2023-12-15 DIAGNOSIS — N184 Chronic kidney disease, stage 4 (severe): Secondary | ICD-10-CM | POA: Diagnosis not present

## 2023-12-15 DIAGNOSIS — E538 Deficiency of other specified B group vitamins: Secondary | ICD-10-CM

## 2023-12-15 DIAGNOSIS — N1832 Chronic kidney disease, stage 3b: Secondary | ICD-10-CM | POA: Diagnosis not present

## 2023-12-15 DIAGNOSIS — Z944 Liver transplant status: Secondary | ICD-10-CM | POA: Insufficient documentation

## 2023-12-15 DIAGNOSIS — D631 Anemia in chronic kidney disease: Secondary | ICD-10-CM

## 2023-12-15 LAB — COMPREHENSIVE METABOLIC PANEL
ALT: 20 U/L (ref 0–44)
AST: 24 U/L (ref 15–41)
Albumin: 2.2 g/dL — ABNORMAL LOW (ref 3.5–5.0)
Alkaline Phosphatase: 97 U/L (ref 38–126)
Anion gap: 8 (ref 5–15)
BUN: 20 mg/dL (ref 8–23)
CO2: 15 mmol/L — ABNORMAL LOW (ref 22–32)
Calcium: 7.4 mg/dL — ABNORMAL LOW (ref 8.9–10.3)
Chloride: 115 mmol/L — ABNORMAL HIGH (ref 98–111)
Creatinine, Ser: 4.24 mg/dL — ABNORMAL HIGH (ref 0.61–1.24)
GFR, Estimated: 15 mL/min — ABNORMAL LOW (ref >60)
Glucose, Bld: 93 mg/dL (ref 70–99)
Potassium: 5.8 mmol/L — ABNORMAL HIGH (ref 3.5–5.1)
Sodium: 138 mmol/L (ref 135–145)
Total Bilirubin: 1 mg/dL (ref 0.0–1.2)
Total Protein: 4.6 g/dL — ABNORMAL LOW (ref 6.5–8.1)

## 2023-12-15 LAB — CBC WITH DIFFERENTIAL/PLATELET
Abs Immature Granulocytes: 0.01 10*3/uL (ref 0.00–0.07)
Basophils Absolute: 0 10*3/uL (ref 0.0–0.1)
Basophils Relative: 1 %
Eosinophils Absolute: 0.1 10*3/uL (ref 0.0–0.5)
Eosinophils Relative: 2 %
HCT: 30.2 % — ABNORMAL LOW (ref 39.0–52.0)
Hemoglobin: 9.6 g/dL — ABNORMAL LOW (ref 13.0–17.0)
Immature Granulocytes: 0 %
Lymphocytes Relative: 24 %
Lymphs Abs: 1 10*3/uL (ref 0.7–4.0)
MCH: 31.5 pg (ref 26.0–34.0)
MCHC: 31.8 g/dL (ref 30.0–36.0)
MCV: 99 fL (ref 80.0–100.0)
Monocytes Absolute: 0.4 10*3/uL (ref 0.1–1.0)
Monocytes Relative: 10 %
Neutro Abs: 2.7 10*3/uL (ref 1.7–7.7)
Neutrophils Relative %: 63 %
Platelets: 188 10*3/uL (ref 150–400)
RBC: 3.05 MIL/uL — ABNORMAL LOW (ref 4.22–5.81)
RDW: 15.9 % — ABNORMAL HIGH (ref 11.5–15.5)
WBC: 4.2 10*3/uL (ref 4.0–10.5)
nRBC: 0 % (ref 0.0–0.2)

## 2023-12-15 LAB — MAGNESIUM: Magnesium: 1.8 mg/dL (ref 1.7–2.4)

## 2023-12-15 NOTE — Patient Instructions (Signed)
[facility] at [facility] **VISIT SUMMARY & IMPORTANT INSTRUCTIONS **   You were seen today by Rojelio Brenner PA-C for your follow-up visit.   We will increase the frequency of your Retacrit injections to be done every 2 weeks You do NOT need to restart your vitamin B12  FOLLOW-UP APPOINTMENT: 3 months  ** Thank you for trusting me with your healthcare!  I strive to provide all of my patients with quality care at each visit.  If you receive a survey for this visit, I would be so grateful to you for taking the time to provide feedback.  Thank you in advance!  ~ Race Latour                   Dr. Doreatha Massed   &   Rojelio Brenner, PA-C   - - - - - - - - - - - - - - - - - -    Thank you for choosing [facility] at [facility] to provide your oncology and hematology care.  To afford each patient quality time with our provider, please arrive at least 15 minutes before your scheduled appointment time.   If you have a lab appointment with the Cancer Center please come in thru the Main Entrance and check in at the main information desk.  You need to re-schedule your appointment should you arrive 10 or more minutes late.  We strive to give you quality time with our providers, and arriving late affects you and other patients whose appointments are after yours.  Also, if you no show three or more times for appointments you may be dismissed from the clinic at the providers discretion.     Again, thank you for choosing [facility].  Our hope is that these requests will decrease the amount of time that you wait before being seen by our physicians.       _____________________________________________________________  Should you have questions after your visit to [facility], please contact our office at [phone] and follow the prompts.  Our office hours are 8:00 a.m. and 4:30 p.m. Monday - Friday.  Please note that  voicemails left after 4:00 p.m. may not be returned until the following business day.  We are closed weekends and major holidays.  You do have access to a nurse 24-7, just call the main number to the clinic [phone] and do not press any options, hold on the line and a nurse will answer the phone.    For prescription refill requests, have your pharmacy contact our office and allow 72 hours.

## 2023-12-17 LAB — TACROLIMUS LEVEL: Tacrolimus (FK506) - LabCorp: 7.2 ng/mL (ref 5.0–20.0)

## 2023-12-21 ENCOUNTER — Inpatient Hospital Stay: Payer: BC Managed Care – PPO

## 2023-12-21 VITALS — BP 114/59 | HR 76 | Temp 97.8°F | Resp 19

## 2023-12-21 DIAGNOSIS — N1832 Chronic kidney disease, stage 3b: Secondary | ICD-10-CM

## 2023-12-21 DIAGNOSIS — D631 Anemia in chronic kidney disease: Secondary | ICD-10-CM

## 2023-12-21 DIAGNOSIS — N184 Chronic kidney disease, stage 4 (severe): Secondary | ICD-10-CM | POA: Diagnosis not present

## 2023-12-21 LAB — CBC
HCT: 28.2 % — ABNORMAL LOW (ref 39.0–52.0)
Hemoglobin: 9.1 g/dL — ABNORMAL LOW (ref 13.0–17.0)
MCH: 31.7 pg (ref 26.0–34.0)
MCHC: 32.3 g/dL (ref 30.0–36.0)
MCV: 98.3 fL (ref 80.0–100.0)
Platelets: 145 10*3/uL — ABNORMAL LOW (ref 150–400)
RBC: 2.87 MIL/uL — ABNORMAL LOW (ref 4.22–5.81)
RDW: 15.6 % — ABNORMAL HIGH (ref 11.5–15.5)
WBC: 3.3 10*3/uL — ABNORMAL LOW (ref 4.0–10.5)
nRBC: 0 % (ref 0.0–0.2)

## 2023-12-21 MED ORDER — EPOETIN ALFA-EPBX 10000 UNIT/ML IJ SOLN
10000.0000 [IU] | Freq: Once | INTRAMUSCULAR | Status: AC
  Administered 2023-12-21: 10000 [IU] via SUBCUTANEOUS
  Filled 2023-12-21: qty 1

## 2023-12-21 NOTE — Progress Notes (Signed)
Patient's Hgb 9.1 and blood pressure stable. Pt tolerated Retacrit injection with no complaints voiced.  Site clean and dry with no bruising or swelling noted at site.  See MAR for details.  Band aid applied.  Patient stable during and after injection.  Vss with discharge and left in satisfactory condition with no s/s of distress noted. All follow ups as scheduled.   Michael Harmon [employer]

## 2023-12-21 NOTE — Patient Instructions (Signed)

## 2023-12-22 ENCOUNTER — Inpatient Hospital Stay: Payer: BC Managed Care – PPO

## 2024-01-03 ENCOUNTER — Telehealth: Payer: Self-pay

## 2024-01-03 NOTE — Transitions of Care (Post Inpatient/ED Visit) (Signed)
   01/03/2024  Name: Michael Harmon MRN: [id number] DOB: 11-20-1960  Today's TOC FU Call Status: Today's TOC FU Call Status:: Successful TOC FU Call Completed TOC FU Call Complete Date: 01/03/24 Patient's Name and Date of Birth confirmed.  Transition Care Management Follow-up Telephone Call Date of Discharge: 01/01/24 Discharge Facility: Other ([facility]) Name of Other ([facility]) Discharge Facility: [facility] Type of Discharge: Inpatient Admission Primary Inpatient Discharge Diagnosis:: Bilateral lower extremity edema How have you been since you were released from the hospital?: Same (Reports the same has I was when I went into the hospital.  [facility] is playing a game. You can look at my chart and copy and paste for the last 3 admissions.) Any questions or concerns?: No (refused to complete call.)  Placed call to patient who states that he "is no different than I was the last 3 times I was at [facility]. Reports they are playing a game with me and I am not going to tell you about it.  You can copy and paste"  Patient declines to complete TOC call with me. I provided my contact information for patient to call me back if he has questions.    Lonia Chimera, RN, BSN, CEN [employer]- Transition of Care Team.  [facility] [phone]

## 2024-01-05 ENCOUNTER — Inpatient Hospital Stay: Payer: BC Managed Care – PPO | Attending: Physician Assistant

## 2024-01-05 ENCOUNTER — Other Ambulatory Visit: Payer: Self-pay | Admitting: Physician Assistant

## 2024-01-05 ENCOUNTER — Inpatient Hospital Stay: Payer: BC Managed Care – PPO

## 2024-01-05 VITALS — BP 138/59 | HR 75 | Temp 97.8°F | Resp 18

## 2024-01-05 DIAGNOSIS — D631 Anemia in chronic kidney disease: Secondary | ICD-10-CM | POA: Insufficient documentation

## 2024-01-05 DIAGNOSIS — N184 Chronic kidney disease, stage 4 (severe): Secondary | ICD-10-CM | POA: Insufficient documentation

## 2024-01-05 DIAGNOSIS — D509 Iron deficiency anemia, unspecified: Secondary | ICD-10-CM | POA: Insufficient documentation

## 2024-01-05 LAB — CBC
HCT: 27 % — ABNORMAL LOW (ref 39.0–52.0)
Hemoglobin: 8.6 g/dL — ABNORMAL LOW (ref 13.0–17.0)
MCH: 32 pg (ref 26.0–34.0)
MCHC: 31.9 g/dL (ref 30.0–36.0)
MCV: 100.4 fL — ABNORMAL HIGH (ref 80.0–100.0)
Platelets: 115 10*3/uL — ABNORMAL LOW (ref 150–400)
RBC: 2.69 MIL/uL — ABNORMAL LOW (ref 4.22–5.81)
RDW: 16 % — ABNORMAL HIGH (ref 11.5–15.5)
WBC: 3.6 10*3/uL — ABNORMAL LOW (ref 4.0–10.5)
nRBC: 0 % (ref 0.0–0.2)

## 2024-01-05 MED ORDER — EPOETIN ALFA-EPBX 20000 UNIT/ML IJ SOLN
20000.0000 [IU] | Freq: Once | INTRAMUSCULAR | Status: AC
  Administered 2024-01-05: 20000 [IU] via SUBCUTANEOUS
  Filled 2024-01-05: qty 1

## 2024-01-05 NOTE — Patient Instructions (Signed)

## 2024-01-05 NOTE — Progress Notes (Signed)
 Hgb continues to drift downwards, with Hgb 8.6 today. We will increase dose of Retacrit to 20,000 units every 2 weeks, with continued close attention to hemoglobin trend.  [name], PA-C 01/05/24 9:39 AM

## 2024-01-05 NOTE — Progress Notes (Signed)
 Patient tolerated injection with no complaints voiced.  Site clean and dry with no bruising or swelling noted at site.  See MAR for details.  Band aid applied.  Patient stable during and after injection.  Vss with discharge and left in satisfactory condition with no s/s of distress noted.

## 2024-01-19 ENCOUNTER — Inpatient Hospital Stay: Payer: BC Managed Care – PPO

## 2024-01-19 ENCOUNTER — Telehealth: Payer: Self-pay

## 2024-01-19 NOTE — Transitions of Care (Post Inpatient/ED Visit) (Signed)
 01/19/2024  Name: Michael Harmon MRN: [id number] DOB: Dec 10, 1960  Today's TOC FU Call Status: Today's TOC FU Call Status:: Successful TOC FU Call Completed TOC FU Call Complete Date: 01/19/24 Patient's Name and Date of Birth confirmed.  Transition Care Management Follow-up Telephone Call Date of Discharge: 01/18/24 Discharge Facility: Other ([facility]) Name of Other ([facility]) Discharge Facility: [facility] Type of Discharge: Inpatient Admission Primary Inpatient Discharge Diagnosis:: Diarrhea How have you been since you were released from the hospital?: Same Any questions or concerns?: No  Items Reviewed: Did you receive and understand the discharge instructions provided?: Yes Medications obtained,verified, and reconciled?: Yes (Medications Reviewed) Any new allergies since your discharge?: No Dietary orders reviewed?: Yes Type of Diet Ordered:: gluten free diet Do you have support at home?: No (wife lives with patient but wife is disabled.)  Medications Reviewed Today: Medications Reviewed Today     Reviewed by Earlie Server, RN (Registered Nurse) on 01/19/24 at 1032  Med List Status: <None>   Medication Order Taking? Sig Documenting Provider Last Dose Status Informant  acetaminophen (TYLENOL) 325 MG tablet [id number] Yes Take 650 mg by mouth every 8 (eight) hours as needed. [provider] Taking Active   albuterol (VENTOLIN HFA) 108 (90 Base) MCG/ACT inhaler [id number] Yes Inhale 2 puffs into the lungs every 6 (six) hours as needed for wheezing or shortness of breath. [provider] Taking Active   amitriptyline (ELAVIL) 25 MG tablet [id number] Yes Take 25 mg by mouth at bedtime. [provider]  Active            Med Note (ROSE, Janani Chamber U   Wed Jan 19, 2024 10:24 AM) Reports not picked up yet from pharmacy  ammonium lactate (LAC-HYDRIN) 12 % lotion [id number] Yes Apply 1 Application topically daily. [provider]  Active             Med Note (ROSE, Kaloni Bisaillon U   Wed Jan 19, 2024 10:24 AM) Reports not picked up yet from pharmacy  aspirin EC 81 MG tablet [id number] Yes Take by mouth. [provider] Taking Active Self  Continuous Glucose Sensor (FREESTYLE LIBRE 2 SENSOR) [place] [id number] Yes See admin instructions. [provider] Taking Active   Copper Gluconate 2 MG TABS [id number] Yes Take 2 mg by mouth daily. [provider]  Active            Med Note (ROSE, Skeeter Sheard U   Wed Jan 19, 2024 10:27 AM) Has not picked up yet  diphenoxylate-atropine (LOMOTIL) 2.5-0.025 MG tablet [id number] Yes Take 1 tablet by mouth 3 (three) times daily. [provider]  Active            Med Note (ROSE, Kanai Hilger U   Wed Jan 19, 2024 10:27 AM) Has not picked up yet  furosemide (LASIX) 40 MG tablet [id number] Yes Take 40 mg by mouth 2 (two) times daily. [provider] Taking Active   gabapentin (NEURONTIN) 300 MG capsule [id number] Yes Take 200 mg by mouth 2 (two) times daily. [provider] Taking Active   lipase/protease/amylase (CREON) 36000 UNITS CPEP capsule [id number] Yes Take 2 capsules by mouth 3 (three) times daily with meals. [provider]  Active            Med Note (ROSE, Jakari Jacot U   Wed Jan 19, 2024 10:29 AM) Has not picked up from pharmacy yet  ondansetron (ZOFRAN-ODT) 4 MG disintegrating tablet [id number] No Take 4 mg by mouth every 8 (eight)  hours as needed.  Patient not taking: Reported on 01/19/2024   [provider] Not Taking Active   pantoprazole (PROTONIX) 40 MG tablet [id number] Yes Take 40 mg by mouth 2 (two) times daily. [provider] Taking Active Self  pyridoxine (B-6) 100 MG tablet [id number] Yes Take 100 mg by mouth daily. [provider] Taking Active   tacrolimus (PROGRAF) 1 MG capsule [id number] Yes Take by mouth 2 (two) times daily. Taking 1 mg in the morning and 2 mg at night [provider] Taking Active Self  tamsulosin  (FLOMAX) 0.4 MG CAPS capsule [id number] Yes Take 0.4 mg by mouth daily. [provider] Taking Active Self  VELTASSA 8.4 g packet [id number] No Take 1 packet by mouth daily.  Patient not taking: Reported on 01/19/2024   [provider] Not Taking Active   zinc gluconate 50 MG tablet [id number] Yes Take 50 mg by mouth daily. [provider]  Active            Med Note (ROSE, Tonette Koehne U   Wed Jan 19, 2024 10:31 AM) Has not picked up yet            Home Care and Equipment/Supplies: Were Home Health Services Ordered?: Yes Name of Home Health Agency:: [facility] Has Agency set up a time to come to your home?: Yes First Home Health Visit Date: 01/19/24 Any new equipment or medical supplies ordered?: No  Functional Questionnaire: Do you need assistance with bathing/showering or dressing?: No (states he does not know) Do you need assistance with meal preparation?: No Do you need assistance with eating?: No Do you have difficulty maintaining continence: Yes (due to diarrhea) Do you need assistance with getting out of bed/getting out of a chair/moving?: No Do you have difficulty managing or taking your medications?: No  Follow up appointments reviewed: PCP Follow-up appointment confirmed?: No MD Provider Line Number:[phone] Given: No Specialist Hospital Follow-up appointment confirmed?: Yes Date of Specialist follow-up appointment?: 01/20/24 Follow-Up Specialty Provider:: Sandi Mealy transplant team Do you need transportation to your follow-up appointment?: No Do you understand care options if your condition(s) worsen?: Yes-patient verbalized understanding  SDOH Interventions Today    Flowsheet Row Most Recent Value  SDOH Interventions   Food Insecurity Interventions Intervention Not Indicated  Housing Interventions Intervention Not Indicated  Transportation Interventions Intervention Not Indicated  Utilities Interventions Intervention Not  Indicated      Interventions Today    Flowsheet Row Most Recent Value  Chronic Disease   Chronic disease during today's visit Other  [diarrhea]  General Interventions   General Interventions Discussed/Reviewed General Interventions Discussed, General Interventions Reviewed  Education Interventions   Education Provided Provided Education  [patient states that he is not going to see PCP and has no need too.  Reviewed importance of follow up.]  Provided Verbal Education On Nutrition, Foot Care, Medication, When to see the doctor  Nutrition Interventions   Nutrition Discussed/Reviewed Nutrition Discussed, Nutrition Reviewed  [gluten free diet.  Reviewed importance of eating healthy]  Pharmacy Interventions   Pharmacy Dicussed/Reviewed Medications and their functions  [Patient reports that he has not picked up new RX]      TOC Interventions Today    Flowsheet Row Most Recent Value  TOC Interventions   TOC Interventions Discussed/Reviewed TOC Interventions Discussed, TOC Interventions Reviewed, Post discharge activity limitations per provider, S/S of infection      Assessment:  Patient has not gotten his prescriptions since he was discharged home from  the hospital.  State he will get them today.  I reviewed all the medications and patient reports that "He knows"  Reviewed importance of follow up with PCP and patient refuses.   Offered 30 day TOC program and patient declined. Provided my contact information.  Patient has follow up at [facility] on 01/20/2024   Lonia Chimera, RN, BSN, CEN Population Health- Transition of Care Team.  [facility] [phone]

## 2024-01-28 DIAGNOSIS — R627 Adult failure to thrive: Secondary | ICD-10-CM | POA: Insufficient documentation

## 2024-02-02 ENCOUNTER — Inpatient Hospital Stay: Payer: BC Managed Care – PPO

## 2024-02-04 ENCOUNTER — Ambulatory Visit: Payer: BC Managed Care – PPO | Admitting: Urology

## 2024-02-04 DIAGNOSIS — C61 Malignant neoplasm of prostate: Secondary | ICD-10-CM

## 2024-02-16 ENCOUNTER — Inpatient Hospital Stay: Payer: BC Managed Care – PPO

## 2024-03-01 ENCOUNTER — Inpatient Hospital Stay: Payer: BC Managed Care – PPO

## 2024-03-07 ENCOUNTER — Telehealth: Payer: Self-pay

## 2024-03-07 NOTE — Transitions of Care (Post Inpatient/ED Visit) (Signed)
   03/07/2024  Name: AMITOJ BOLICH MRN: [id number] DOB: [birth year]  Today's TOC FU Call Status: Today's TOC FU Call Status:: Unsuccessful Call (1st Attempt) Unsuccessful Call (1st Attempt) Date: 03/07/24  Attempted to reach the patient regarding the most recent Inpatient/ED visit.  Follow Up Plan: Additional outreach attempts will be made to reach the patient to complete the Transitions of Care (Post Inpatient/ED visit) call.   Orpha Blade, RN, BSN, CEN [employer]- Transition of Care Team.  [facility] [phone]

## 2024-03-08 ENCOUNTER — Telehealth: Payer: Self-pay

## 2024-03-08 NOTE — Transitions of Care (Post Inpatient/ED Visit) (Signed)
 03/08/2024  Name: Michael Harmon MRN: [id number] DOB: 04-22-1961  Today's TOC FU Call Status: Today's TOC FU Call Status:: Successful TOC FU Call Completed TOC FU Call Complete Date: 03/08/24 Patient's Name and Date of Birth confirmed.  Transition Care Management Follow-up Telephone Call How have you been since you were released from the hospital?: Same (Reports no appetite, port pain, tired) Any questions or concerns?: No  Items Reviewed: Did you receive and understand the discharge instructions provided?: No Medications obtained,verified, and reconciled?: Yes (Medications Reviewed) Any new allergies since your discharge?: No Dietary orders reviewed?: Yes Type of Diet Ordered:: gluten free Do you have support at home?: Yes People in Home [RPT]: spouse Name of Support/Comfort Primary Source: Melissa  Medications Reviewed Today: Medications Reviewed Today     Reviewed by Vanetta Generous, RN (Registered Nurse) on 03/08/24 at 1451  Med List Status: <None>   Medication Order Taking? Sig Documenting Provider Last Dose Status Informant  acetaminophen  (TYLENOL ) 325 MG tablet [id number] Yes Take 650 mg by mouth every 8 (eight) hours as needed. [provider] Taking Active   albuterol (VENTOLIN HFA) 108 (90 Base) MCG/ACT inhaler [id number] No Inhale 2 puffs into the lungs every 6 (six) hours as needed for wheezing or shortness of breath.  Patient not taking: Reported on 03/08/2024   [provider] Not Taking Active   amitriptyline (ELAVIL) 25 MG tablet [id number] No Take 25 mg by mouth at bedtime.  Patient not taking: Reported on 03/08/2024   [provider] Not Taking Active            Med Note (ROSE, Graylon Amory U   Wed Jan 19, 2024 10:24 AM) Reports not picked up yet from pharmacy  ammonium lactate (LAC-HYDRIN) 12 % lotion [id number] No Apply 1 Application topically daily.  Patient not taking: Reported on 03/08/2024   [provider] Not Taking Active             Med Note (ROSE, Iyauna Sing U   Wed Jan 19, 2024 10:24 AM) Reports not picked up yet from pharmacy  apixaban (ELIQUIS) 5 MG TABS tablet [id number] No Take 5 mg by mouth 2 (two) times daily.  Patient not taking: Reported on 03/08/2024   [provider] Not Taking Active            Med Note (ROSE, Mattheu Brodersen U   Wed Mar 08, 2024  2:09 PM) Reports that he does not have this medication.  [facility] transplant team notified on 03/08/2024  aspirin  EC 81 MG tablet [id number] No Take by mouth.  Patient not taking: Reported on 03/08/2024   [provider] Not Taking Active Self           Med Note (ROSE, Sachi Boulay U   Wed Mar 08, 2024  2:43 PM) On hold   Calcium  Citrate-Vitamin D  (CALCIUM  CITRATE + D3 PO) [id number] Yes Take 2 tablets by mouth 2 (two) times daily. [provider] Taking Active   cefUROXime (CEFTIN) 500 MG tablet [id number] Yes Take 500 mg by mouth 2 (two) times daily with a meal. [provider] Taking Active   Continuous Glucose Sensor (FREESTYLE LIBRE 2 SENSOR) [place] [id number] Yes See admin instructions. [provider] Taking Active   Copper  Gluconate 2 MG TABS [id number] No Take 2 mg by mouth daily.  Patient not taking: Reported on 03/08/2024   [provider] Not Taking Active            Med Note (ROSE, Cyleigh Massaro  U   Wed Jan 19, 2024 10:27 AM) Has not picked up yet  diphenoxylate-atropine (LOMOTIL) 2.5-0.025 MG tablet [id number] No Take 1 tablet by mouth 3 (three) times daily.  Patient not taking: Reported on 03/08/2024   [provider] Not Taking Active            Med Note (ROSE, Jream Broyles U   Wed Jan 19, 2024 10:27 AM) Has not picked up yet  furosemide  (LASIX ) 40 MG tablet [id number] No Take 40 mg by mouth 2 (two) times daily.  Patient not taking: Reported on 03/08/2024   [provider] Not Taking Active   gabapentin (NEURONTIN) 300 MG capsule [id number] No Take 200 mg by mouth 2 (two) times daily.  Patient not taking: Reported on 03/08/2024    [provider] Not Taking Active   levothyroxine (SYNTHROID) 50 MCG tablet [id number] Yes Take 50 mcg by mouth daily before breakfast. [provider] Taking Active   Lido-Capsaicin-Men-Methyl Sal (MEDI-PATCH-LIDOCAINE  EX) [id number] Yes Apply 1 patch topically as directed. [provider] Unknown Active   lipase/protease/amylase (CREON) 36000 UNITS CPEP capsule [id number] No Take 2 capsules by mouth 3 (three) times daily with meals.  Patient not taking: Reported on 03/08/2024   [provider] Not Taking Active            Med Note (ROSE, Cathaleen Korol U   Wed Jan 19, 2024 10:29 AM) Has not picked up from pharmacy yet  MAGNESIUM-OXIDE PO [id number] Yes Take 266 mg by mouth. [provider] Taking Active   MELATONIN ER PO [id number] No Take 9 mg by mouth at bedtime.  Patient not taking: Reported on 03/08/2024   [provider] Not Taking Active            Med Note (ROSE, Horacio Werth U   Wed Mar 08, 2024  2:42 PM) States that he does not have RX. Transplant team aware  Melatonin-Pyridoxine (MELATIN PO) [id number] No Take 9 mg by mouth at bedtime.  Patient not taking: Reported on 03/08/2024   [provider] Not Taking Active            Med Note (ROSE, Rylen Hou U   Wed Mar 08, 2024  2:39 PM) Reports that he does not have this RX.   Multiple Vitamin (MULTIVITAMIN WITH MINERALS) TABS tablet [id number] No Take 1 tablet by mouth daily.  Patient not taking: Reported on 03/08/2024   [provider] Not Taking Active            Med Note (ROSE, Laster Appling U   Wed Mar 08, 2024  2:40 PM) Reports no RX.  [facility] transplant team informed.   NON FORMULARY [id number] Yes Tube feedings from 6pm-8am  via tube feeding pump. [provider] Taking Active   ondansetron  (ZOFRAN -ODT) 4 MG disintegrating tablet [id number] No Take 4 mg by mouth every 8 (eight) hours as needed.  Patient not taking: Reported on 03/08/2024   [provider] Not Taking Active    pantoprazole  (PROTONIX ) 40 MG tablet [id number] Yes Take 40 mg by mouth 2 (two) times daily. [provider] Taking Active Self  pyridoxine (B-6) 100 MG tablet [id number] No Take 100 mg by mouth daily.  Patient not taking: Reported on 03/08/2024   [provider] Not Taking Active   tacrolimus  (PROGRAF ) 1 MG capsule [id number] Yes Take 2 mg by mouth 2 (two) times daily. [provider] Taking Active   tamsulosin  (FLOMAX ) 0.4 MG CAPS capsule [id number] Yes Take 0.4 mg  by mouth daily. [provider] Taking Active Self  UNABLE TO FIND [id number] Yes Med Name: Magnesium oxide-magnesium amino acid chelate 266 mg BID [provider] Taking Active   VELTASSA 8.4 g packet [id number] No Take 1 packet by mouth daily.  Patient not taking: Reported on 03/08/2024   [provider] Not Taking Active   zinc gluconate 50 MG tablet [id number] Yes Take 220 mg by mouth daily. [provider] Taking Active            Med Note (ROSE, Berna Gitto U   Wed Jan 19, 2024 10:31 AM) Has not picked up yet            Home Care and Equipment/Supplies: Were Home Health Services Ordered?: Yes Name of Home Health Agency:: [facility] Has Agency set up a time to come to your home?: Yes First Home Health Visit Date: 03/08/24 Any new equipment or medical supplies ordered?: Yes Name of Medical supply agency?: [facility] -- feedng pump Were you able to get the equipment/medical supplies?: Yes Do you have any questions related to the use of the equipment/supplies?: No  Functional Questionnaire: Do you need assistance with bathing/showering or dressing?: No Do you need assistance with meal preparation?: Yes (family) Do you need assistance with eating?: No Do you have difficulty maintaining continence: No Do you need assistance with getting out of bed/getting out of a chair/moving?: Yes (using a walker) Do you have difficulty managing or taking your medications?:  No  Follow up appointments reviewed: PCP Follow-up appointment confirmed?: No (will call and schedule appointment) MD Provider Line [phone] Given: No Specialist Hospital Follow-up appointment confirmed?: Yes Date of Specialist follow-up appointment?: 03/10/24 Follow-Up Specialty Provider:: Transplant team Do you need transportation to your follow-up appointment?: No (nephew to take) Do you understand care options if your condition(s) worsen?: Yes-patient verbalized understanding  SDOH Interventions Today    Flowsheet Row Most Recent Value  SDOH Interventions   Food Insecurity Interventions Intervention Not Indicated  Housing Interventions Intervention Not Indicated  Transportation Interventions Intervention Not Indicated  Utilities Interventions Intervention Not Indicated     Liver transplant.   Inpatient admission for about 1 month.  Patient reports that he does have some of his medications. Reports home health has been out to see him. Wife is support system and states that patient is doing well. Patient reports that he does not have his Mvi, Eliquis, Creon ( 2400.00) or Melotinin,  Spoke with Kristin Head with the transplant team. She reports that she spoke with patient twice yesterday about tube feedings.  She reports that she will call patient now about medications.  Updated medication list.  Reviewed with patient when to call MD.  Reviewed importance of following all discharge instructions from [facility].  Encouraged patient to call transplant coordinator or any concerns.   Reviewed and offered 30 day TOC program and patient is active with transplant team program and declined 30 day program.  I encouraged patient to call MD and make a PCP follow up appointment. Also provided my contact information for patient to call me if needed.   Orpha Blade, RN, BSN, CEN [employer]- Transition of Care Team.  [facility] [phone]

## 2024-03-09 ENCOUNTER — Encounter (HOSPITAL_COMMUNITY): Payer: Self-pay

## 2024-03-14 NOTE — Progress Notes (Unsigned)
 [facility] [address], [place] [address]   CLINIC:  Medical Oncology/Hematology  PCP:  Meldon Sport, MD [address] 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 Michael Harmon 63 y.o. 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  He was last seen by Sheril Dines PA-C on 12/15/2023.  Since last visit, he has been hospitalized four times: 12/30/2023 through 01/01/2024: Hospitalized at [facility] for fluid overload and AKI.   01/08/2024 through 01/18/2024: Hospitalized at [facility] for fluid overload, AKI, and failure to thrive with acute on chronic diarrhea. 01/28/2024 through 02/14/2024: Hospitalized at [facility] for hypoxia and AKI with failure to thrive.   Found to have pulmonary embolism and started on Eliquis.   Altered mental status/delirium thought to be related to hypophosphatemia.   G-tube placed on 02/03/2024 for tube feedings.   Received 1 unit PRBC on 02/10/2024.  Patient was discharged to SNF. 02/19/2024 through 03/06/2024: Hospitalized at [facility] for altered mental status and elevated LFTs. Received PRBC x 3.  No bloody bowel movements per discharge summary, but had right buttock hematoma after fall at SNF.  (CTAP with contrast showed right gluteal hematoma with no active extravasation).  Hgb was stable after he received 20,000 units of EPO on 02/23/2024. EGD, capsule endoscopy, and colonoscopy at [facility] were without active signs of bleeding. Continues on tube feeding for malnutrition. Discharged to home health  Patient reports that he is no longer being considered for kidney transplant.  At today's visit, he reports feeling  poorly, secondary to multiple health issues and frequent hospitalizations as described above.   He continues to bruise easily, but denies any obvious bleeding such as epistaxis, hematochezia, melena, or hematuria.  He is on Eliquis twice daily for DVT/PE.  He denies any new lumps or bumps, apart from right buttock hematoma as described above.  He has not had any recent infections.   He denies any B symptoms.   He has little to no energy and low appetite.  He remains on tube feedings at this time. His weight is stable.  Patient missed several doses of Retacrit  due to hospitalizations as above.   Otherwise, he is tolerating Retacrit  fairly well.  Blood pressure today is within target range.    ASSESSMENT & PLAN:  1.  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 - CT scan (11/14/2021 at [facility]) showed enlarged spleen measuring 17 cm - Work-up negative for other causes of thrombocytopenia - normal B12, methylmalonic acid, folate, copper ; negative rheumatoid factor/ANA; no abnormal platelets noted on pathology smear review - Received platelet transfusion x1 on 08/18/2021 prior to prostate biopsy.  Blood transfusion x1 on 01/15/2022 prior to prostate SpaceOAR placement is - Bone marrow biopsy (02/10/2022) shows hypercellular marrow for age with trilineage lineage hematopoiesis.  No increase in blasts.  No evidence  of metastatic carcinoma. - Liver transplant at [facility] on 01/16/2023, on Myfortic  which may have been contributing to his neutropenia (dose reduced on 05/31/2023 due to neutropenia) - Labs today (03/15/2024): Platelets 146.  WBC  2.5/ANC 0.9 with mild left shift. - DIFFERENTIAL DIAGNOSIS: Thrombocytopenia and leukopenia likely secondary to cirrhosis (s/p liver transplant 01/16/2023) and splenic sequestration.  Unclear if patient has any underlying ITP in addition to this. Discussed with patient that spleen size may not fully returned to normal after liver transplantation, and therefore platelet count may or may not fully return to normal - PLAN:  Platelets are improved following liver transplantation.  No indication for treatment at this time.  We will continue to monitor with periodic CBCs. - Downtrending WBCs noted.  ATTENTION TO WBC at follow-up.   2.  Normocytic anemia, secondary to CKD + iron  deficiency  - Bone marrow biopsy (02/10/2022) shows hypercellular marrow for age with trilineage lineage hematopoiesis.  No increase in blasts.  No evidence of metastatic carcinoma. - EGDs at [facility] in April 2023 in October 2023 showed esophageal varices (s/p banding), old blood in the stomach, and portal hypertensive gastropathy  - Other work-up (08/18/2021) showed normal reticulocytes; normal B12, methylmalonic acid, folate, and copper .  Immunofixation and SPEP negative.  (Homocystine elevation due to cirrhosis, no improvement after folic acid  supplementation) - Patient reports that he has been diagnosed with celiac disease by Dr. Riley Cheadle - He received IV Venofer  x1000 mg, last dose given 09/03/2021 - Retacrit  10,000 units every 2 weeks started 10/22/2022 (interrupted due to liver transplant 01/16/2023).  Retacrit  restarted on 04/14/2023.  Current dose is 20,000 units every 2 weeks. - Intermittent blood transfusions, most recently PRBC x 3 during hospitalization at [facility] in April/May 2025.  No bloody bowel movements noted during hospital stay, but patient had right buttock hematoma from fall at SNF, with CTAP with contrast showing right gluteal hematoma with no active extravasation. - Per discharge summary from [facility] (03/06/2024), recent EGD,  capsule endoscopy, and colonoscopy were without active signs of bleeding. - G-tube placed on 02/03/2024, remains on tube feedings for malnutrition and failure to thrive  - Labs today (03/15/2024):  Hgb 8.4/MCV 96.3 Vitamin B12 is marginally low at 286 CMP, MMA, ferritin, iron /TIBC are PENDING  - Started on Eliquis 02/07/2024 due to DVT/PE.   - Denies any rectal bleeding or melena.   - Reports severe fatigue related to other comorbidities.   No pica, chest pain, or difficulty breathing. - DIFFERENTIAL DIAGNOSIS: Anemia secondary to CKD, further complicated by malabsorption (celiac disease), intermittent GI bleeding - PLAN:**Will follow pending labs (particularly iron  panel and CMP), and message patient with any changes to plan based on these results.** - Otherwise, continue Retacrit  20,000 units every 2 weeks - Recommend RESTARTING vitamin B12 1000 mcg daily.  Rx sent to pharmacy. - Labs (CBC/D, CMP, ferritin, iron /TIBC, B12, MMA) in 3 months with OFFICE visit 1 week after labs  3.  Pulmonary embolism and DVT - Diagnosed at [facility], presented with hypoxia on 01/28/2024 - CT (02/05/2024) showed right lower lobe pulmonary embolus, extending from right pulmonary artery into segmental and subsegmental branches - Bilateral lower extremity US  (02/06/2024): Nonocclusive DVT within right popliteal vein - Started on Eliquis as of 02/07/2024 - Likely provoked DVT/PE due to multiple hospitalizations in the immediate time period preceding DVT/PE - PLAN: Continue Eliquis 5 mg twice daily for the foreseeable future.  Recommend at least 6 months of treatment, possibly longer depending on persistent risk factors.  Would recommend  checking hypercoagulable panel prior to any discontinuation of anticoagulant.  4.  Cirrhosis with esophageal varices - S/P LIVER TRANSPLANT 01/16/2023 - Diagnosed in 2017 - Patient had EGD/colonoscopy on 07/31/2021 which showed portal colopathy as well as gastric erosions and grade 3 varices, which  were not banded due to low platelet count - EGD (12/26/2021 via [facility]): Grade 2 esophageal varices, banded x3; old blood noted in stomach; mild portal hypertensive gastropathy - Patient follows with hepatology at [facility] - Liver transplant at [facility] on 01/16/2023 NOTE: Patient's post-transplant nurse coordinator at [facility] is [facility]  [phone])    5.  Stage T1c adenocarcinoma of the prostate, Gleason 3+4   - Diagnosed in October 2022 with prostate biopsy after abnormal PSA (8.2) - Following with urologist (Dr. Derrick Fling / Dr. Claretta Croft) and has also been seen by radiation oncology (Ashlyn Bruning PA-C / Dr. Kenith Payer) - He had SpaceOAR placement on 01/15/2022 - Patient completed XRT in [place] on 03/13/2022. - He is receiving Lupron  shots and following with urology and radiation oncology  PLAN SUMMARY:  >> 03/15/2024-- follow pending labs - send [facility] w/ any changes >> CBC + Retacrit  every 2 weeks (**attention to WBC trend**) >> Rx to pharmacy for B12 1,000 mcg daily >> Labs in 3 months = CBC/D, CMP, ferritin, iron /TIBC, B12, MMA >> OFFICE visit in 3 months (2 weeks after full lab panel) + CBC/Injection     REVIEW OF SYSTEMS:   Review of Systems  Constitutional:  Positive for appetite change and fatigue. Negative for chills, diaphoresis, fever and unexpected weight change.  HENT:   Negative for lump/mass and nosebleeds.   Eyes:  Negative for eye problems.  Respiratory:  Negative for cough, hemoptysis and shortness of breath.   Cardiovascular:  Negative for chest pain, leg swelling and palpitations.  Gastrointestinal:  Positive for constipation, diarrhea and nausea. Negative for abdominal pain, blood in stool and vomiting.  Genitourinary:  Negative for hematuria.   Musculoskeletal:  Positive for arthralgias.  Skin: Negative.   Neurological:  Positive for dizziness and numbness. Negative for headaches and light-headedness.  Hematological:  Does not bruise/bleed easily.   Psychiatric/Behavioral:  Positive for decreased concentration (Forgetful) and sleep disturbance.     PHYSICAL EXAM:  ECOG PERFORMANCE STATUS: 2 - Symptomatic, <50% confined to bed  Vitals:   03/15/24 0913  BP: (!) 140/61  Pulse: 79  Resp: 16  Temp: 97.8 F (36.6 C)  SpO2: 97%   Filed Weights   03/15/24 0913  Weight: 174 lb 6.1 oz (79.1 kg)    Physical Exam Constitutional:      Appearance: Normal appearance. He is obese.     Comments: Appears frail and chronically ill  Cardiovascular:     Rate and Rhythm: Normal rate.     Heart sounds: Normal heart sounds.  Pulmonary:     Breath sounds: Normal breath sounds.  Neurological:     General: No focal deficit present.     Mental Status: Mental status is at baseline.  Psychiatric:        Behavior: Behavior normal. Behavior is cooperative.     PAST MEDICAL/SURGICAL HISTORY:  Past Medical History:  Diagnosis Date   Allergy    nose and sinus problems   Anemia    Anemia in chronic kidney disease (CKD) 11/23/2022   Asthma    Celiac disease    Cirrhosis, cryptogenic (HCC)    completed Hep A and B vaccines   DM (diabetes mellitus) (HCC)    Encounter  for general adult medical examination with abnormal findings 09/08/2022   GERD (gastroesophageal reflux disease)    Hyperlipidemia    diet controlled now with 100 pound weight loss   Hypertension    Iron  deficiency anemia due to chronic blood loss 08/13/2021   Malignant neoplasm of prostate (HCC) 10/03/2021   Murmur, heart 03/02/2022   Narrow angle glaucoma suspect of both eyes    Renal cyst 09/03/2017   right   Situational depression 07/06/2017   Splenomegaly    Thrombocytopenia (HCC)    hematology, ?related to chol med (tricor) and glimeripide, just being monitored, above 100,000   Past Surgical History:  Procedure Laterality Date   BIOPSY  07/09/2016   Procedure: BIOPSY;  Surgeon: Suzette Espy, MD;  [facility];  Service: Endoscopy;;  duodenal, gastric,  esophaageal   BIOPSY  07/31/2021   Procedure: BIOPSY;  Surgeon: Suzette Espy, MD;  [facility];  Service: Endoscopy;;   CLOSED MANIPULATION SHOULDER     COLONOSCOPY  11/2015   Dr. Lydia Sams: cecal bx neg for microscopic colitis. ascending colon polyp (adenomatous)   COLONOSCOPY WITH PROPOFOL  N/A 07/31/2021   Procedure: COLONOSCOPY WITH PROPOFOL ;  Surgeon: Suzette Espy, MD;  [facility]: [facility];  Service: Endoscopy;  Laterality: N/A;  7:30am   ESOPHAGEAL BANDING N/A 08/04/2017   Procedure: ESOPHAGEAL BANDING;  Surgeon: Suzette Espy, MD;  [facility];  Service: Endoscopy;  Laterality: N/A;  esophageal varices banding   ESOPHAGEAL BANDING N/A 10/13/2017   Procedure: ESOPHAGEAL BANDING;  Surgeon: Suzette Espy, MD;  [facility];  Service: Endoscopy;  Laterality: N/A;   ESOPHAGEAL BANDING N/A 04/09/2020   Procedure: ESOPHAGEAL BANDING;  Surgeon: Suzette Espy, MD;  [facility];  Service: Endoscopy;  Laterality: N/A;   ESOPHAGOGASTRODUODENOSCOPY N/A 07/09/2016   Procedure: ESOPHAGOGASTRODUODENOSCOPY (EGD);  Surgeon: Suzette Espy, MD;  [facility];  Service: Endoscopy;  Laterality: N/A;  730   ESOPHAGOGASTRODUODENOSCOPY N/A 08/04/2017   Procedure: ESOPHAGOGASTRODUODENOSCOPY (EGD);  Surgeon: Suzette Espy, MD;  [facility];  Service: Endoscopy;  Laterality: N/A;  7:30am   ESOPHAGOGASTRODUODENOSCOPY N/A 10/13/2017   Dr. Riley Cheadle: grade 2 esophageal varices status post banding, portal hypertensive gastropathy   ESOPHAGOGASTRODUODENOSCOPY  11/23/2019   [name]: Esophageal varices, 4 columns of grade 2-3 status post banding.  Varices more prominent than seen in December 2018.  Portal gastropathy.  Normal-appearing small bowel.   ESOPHAGOGASTRODUODENOSCOPY (EGD) WITH PROPOFOL  N/A 04/09/2020   Dr. Riley Cheadle: 4 columns of grade 2/grade 3 esophageal varices somewhat recalcitrant.  Status post esophageal band ligation today.  Portal  gastropathy.  Normal-appearing small bowel.   ESOPHAGOGASTRODUODENOSCOPY (EGD) WITH PROPOFOL  N/A 07/31/2021   Procedure: ESOPHAGOGASTRODUODENOSCOPY (EGD) WITH PROPOFOL ;  Surgeon: Suzette Espy, MD;  [facility];  Service: Endoscopy;  Laterality: N/A;   GOLD SEED IMPLANT N/A 01/15/2022   Procedure: GOLD SEED IMPLANT;  Surgeon: Marco Severs, MD;  Location: [facility];  Service: Urology;  Laterality: N/A;   LIVER TRANSPLANT  01/16/2023   SPACE OAR INSTILLATION N/A 01/15/2022   Procedure: SPACE OAR INSTILLATION;  Surgeon: Marco Severs, MD;  Location: [facility];  Service: Urology;  Laterality: N/A;   US  PARACENTESIS  06/10/2022    SOCIAL HISTORY:  Social History   Socioeconomic History   Marital status: Married    Spouse name: Melissa   Number of children: 0   Years of education: 14   Highest education  level: Associate degree: occupational, Scientist, product/process development, or vocational program  Occupational History   Occupation: Lexicographer, 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   Has no children.   Eats all food groups.   Works for [employer]       Social Drivers of Corporate investment banker Strain: Low Risk  (03/02/2024)   Received from [facility]   Overall Financial Resource Strain (CARDIA)    Difficulty of Paying Living Expenses: Not hard at all  Food Insecurity: No Food Insecurity (03/08/2024)   Hunger Vital Sign    Worried About Running Out of Food in the Last Year: Never true    Ran Out of Food in the Last Year: Never true  Transportation Needs: No Transportation Needs (03/08/2024)   PRAPARE - Therapist, art (Medical): No    Lack of Transportation (Non-Medical): No  Physical Activity: Insufficiently Active (12/07/2023)   Exercise Vital Sign    Days of Exercise per Week: 2 days    Minutes of Exercise per Session: 30 min  Stress: Stress Concern Present (12/07/2023)   [employer] of Occupational Health - Occupational Stress Questionnaire    Feeling of Stress : To some extent  Social Connections: Socially Integrated (12/07/2023)   Social Connection and Isolation Panel [NHANES]    Frequency of Communication with Friends and Family: More than three times a week    Frequency of Social Gatherings with Friends and Family: Twice a week    Attends Religious Services: More than 4 times per year    Active Member of [employer] or Organizations: Yes    Attends Engineer, structural: More than 4 times per year    Marital Status: Married  Catering manager Violence: Patient Unable To Answer (03/08/2024)   Humiliation, Afraid, Rape, and Kick questionnaire    Fear of Current or Ex-Partner: Patient unable to answer    Emotionally Abused: Patient unable to answer    Physically Abused: Patient unable to answer    Sexually Abused: Patient unable to answer    FAMILY HISTORY:  Family History  Problem Relation Age of Onset   Other Mother        chronic diarrhea   COPD Mother    Heart disease Mother        died at 71   Arthritis Mother    Cancer Mother    Depression Mother    Diabetes Mother    Hyperlipidemia Mother    Hypertension Mother    Stroke Mother    Arthritis Father    Asthma Father    Birth defects Father    Heart disease Father        aortic valve replaced   Heart disease Maternal Grandmother    Stroke Maternal Grandmother    Heart disease Maternal Grandfather    Stroke Maternal Grandfather    Diabetes Paternal Grandmother    Stroke Paternal Grandfather    Heart disease Paternal Grandfather    Liver disease Neg Hx    Colon cancer Neg Hx     CURRENT  MEDICATIONS:  Outpatient Encounter Medications as of 03/15/2024  Medication Sig Note   acetaminophen  (TYLENOL ) 325 MG tablet Take 650 mg by mouth every 8 (eight) hours as needed.    albuterol (VENTOLIN HFA) 108 (90 Base) MCG/ACT inhaler Inhale 2 puffs into the lungs every 6 (six) hours as needed for wheezing or shortness of breath.    amitriptyline (ELAVIL) 25 MG tablet Take 25 mg by mouth at bedtime. 01/19/2024: Reports not picked up yet from pharmacy   ammonium lactate (LAC-HYDRIN) 12 % lotion Apply 1 Application topically daily. 01/19/2024: Reports not picked up yet from pharmacy   apixaban (ELIQUIS) 5 MG TABS tablet Take 5 mg by mouth 2 (two) times daily. 03/08/2024: Reports that he does not have this medication.  [facility] transplant team notified on 03/08/2024   aspirin  EC 81 MG tablet Take by mouth. 03/08/2024: On hold    Calcium  Citrate-Vitamin D  (CALCIUM  CITRATE + D3 PO) Take 2 tablets by mouth 2 (two) times daily.    cefUROXime (CEFTIN) 500 MG tablet Take 500 mg by mouth 2 (two) times daily with a meal.    Continuous Glucose Sensor (FREESTYLE LIBRE 2 SENSOR) MISC See admin instructions.    Copper  Gluconate 2 MG TABS Take 2 mg by mouth daily. 01/19/2024: Has not picked up yet   diphenoxylate-atropine (LOMOTIL) 2.5-0.025 MG tablet Take 1 tablet by mouth 3 (three) times daily. 01/19/2024: Has not picked up yet   furosemide  (LASIX ) 40 MG tablet Take 40 mg by mouth 2 (two) times daily.    gabapentin (NEURONTIN) 300 MG capsule Take 200 mg by mouth 2 (two) times daily.    levothyroxine (SYNTHROID) 50 MCG tablet Take 50 mcg by mouth daily before breakfast.    Lido-Capsaicin-Men-Methyl Sal (MEDI-PATCH-LIDOCAINE  EX) Apply 1 patch topically as directed.    loperamide (IMODIUM) 2 MG capsule Take 2 mg by mouth.    MAGNESIUM-OXIDE PO Take 266 mg by mouth.    MELATONIN ER PO Take 9 mg by mouth at bedtime. 03/08/2024: States that he does not have RX. Transplant team aware   Melatonin-Pyridoxine (MELATIN PO) Take 9  mg by mouth at bedtime. 03/08/2024: Reports that he does not have this RX.    Multiple Vitamin (MULTIVITAMIN WITH MINERALS) TABS tablet Take 1 tablet by mouth daily. 03/08/2024: Reports no RX.  [facility] transplant team informed.    NON FORMULARY Tube feedings from 6pm-8am  via tube feeding pump.    ondansetron  (ZOFRAN -ODT) 4 MG disintegrating tablet Take 4 mg by mouth every 8 (eight) hours as needed.    Pancrelipase, Lip-Prot-Amyl, 25000-79000 units CPEP Take 2 capsules by mouth.    pantoprazole  (PROTONIX ) 40 MG tablet Take 40 mg by mouth 2 (two) times daily.    pyridoxine (B-6) 100 MG tablet Take 100 mg by mouth daily.    simethicone  (MYLICON) 80 MG chewable tablet Chew 80 mg by mouth.    tacrolimus  (PROGRAF ) 1 MG capsule Take 2 mg by mouth 2 (two) times daily.    tamsulosin  (FLOMAX ) 0.4 MG CAPS capsule Take 0.4 mg by mouth daily.    UNABLE TO FIND Med Name: Magnesium oxide-magnesium amino acid chelate 266 mg BID    VELTASSA 8.4 g packet Take 1 packet by mouth daily.    zinc gluconate 50 MG tablet Take 220 mg by mouth daily. 01/19/2024: Has not picked up yet   zinc sulfate, 50mg  elemental zinc, 220 (50 Zn) MG capsule Take 220 mg by mouth.    [DISCONTINUED] lipase/protease/amylase (CREON) 36000 UNITS CPEP  capsule Take 2 capsules by mouth 3 (three) times daily with meals. 01/19/2024: Has not picked up from pharmacy yet   cyanocobalamin  (VITAMIN B12) 500 MCG tablet Take 1 tablet by mouth daily.    LOKELMA  5 g packet daily.    No facility-administered encounter medications on file as of 03/15/2024.    ALLERGIES:  Allergies  Allergen Reactions   Penicillins Other (See Comments)    As a baby, unknown reaction Did it involve swelling of the face/tongue/throat, SOB, or low BP? Unknown Did it involve sudden or severe rash/hives, skin peeling, or any reaction on the inside of your mouth or nose? Unknown Did you need to seek medical attention at a hospital or doctor's office? Unknown When did it last happen?       infant allergy If all above answers are "NO", may proceed with cephalosporin use. .    Gluten Meal Diarrhea   Biaxin [Clarithromycin] Tinitus    Ears ringing, loss sense of smell    LABORATORY DATA:  I have reviewed the labs as listed.  CBC    Component Value Date/Time   WBC 2.5 (L) 03/15/2024 0835   RBC 2.73 (L) 03/15/2024 0835   HGB 8.4 (L) 03/15/2024 0835   HGB 11.1 (L) 09/02/2021 1655   HCT 26.3 (L) 03/15/2024 0835   HCT 32.9 (L) 09/02/2021 1655   PLT 146 (L) 03/15/2024 0835   PLT 43 (LL) 09/02/2021 1655   MCV 96.3 03/15/2024 0835   MCV 96 09/02/2021 1655   MCH 30.8 03/15/2024 0835   MCHC 31.9 03/15/2024 0835   RDW 17.4 (H) 03/15/2024 0835   RDW 14.0 09/02/2021 1655   LYMPHSABS 1.1 03/15/2024 0835   LYMPHSABS 1.2 09/02/2021 1655   MONOABS 0.2 03/15/2024 0835   EOSABS 0.2 03/15/2024 0835   EOSABS 0.3 09/02/2021 1655   BASOSABS 0.0 03/15/2024 0835   BASOSABS 0.0 09/02/2021 1655      Latest Ref Rng & Units 12/15/2023   10:15 AM 12/08/2023    9:41 AM 11/29/2023   10:05 AM  CMP  Glucose 70 - 99 mg/dL 93  295  621   BUN 8 - 23 mg/dL 20  22  28    Creatinine 0.61 - 1.24 mg/dL 3.08  6.57  8.46   Sodium 135 - 145 mmol/L 138  138  135   Potassium 3.5 - 5.1 mmol/L 5.8  5.1  5.3   Chloride 98 - 111 mmol/L 115  111  107   CO2 22 - 32 mmol/L 15  18  22    Calcium  8.9 - 10.3 mg/dL 7.4  7.5  7.5   Total Protein 6.5 - 8.1 g/dL 4.6  4.7  4.6   Total Bilirubin 0.0 - 1.2 mg/dL 1.0  0.9  1.1   Alkaline Phos 38 - 126 U/L 97  114  141   AST 15 - 41 U/L 24  29  47   ALT 0 - 44 U/L 20  23  30      DIAGNOSTIC IMAGING:  I have independently reviewed the relevant imaging and discussed with the patient.   WRAP UP:  All questions were answered. The patient knows to call the clinic with any problems, questions or concerns.  Medical decision making: Moderate  Time spent on visit: I spent 20 minutes counseling the patient face to face. The total time spent in the appointment was 30  minutes and more than 50% was on counseling.  Sonnie Dusky, PA-C  03/15/24 10:53 AM

## 2024-03-15 ENCOUNTER — Inpatient Hospital Stay: Payer: BC Managed Care – PPO

## 2024-03-15 ENCOUNTER — Inpatient Hospital Stay (HOSPITAL_BASED_OUTPATIENT_CLINIC_OR_DEPARTMENT_OTHER): Payer: BC Managed Care – PPO | Admitting: Physician Assistant

## 2024-03-15 ENCOUNTER — Encounter: Payer: Self-pay | Admitting: Physician Assistant

## 2024-03-15 ENCOUNTER — Inpatient Hospital Stay: Payer: BC Managed Care – PPO | Attending: Physician Assistant

## 2024-03-15 VITALS — BP 140/61 | HR 79 | Temp 97.8°F | Resp 16 | Wt 174.4 lb

## 2024-03-15 DIAGNOSIS — D631 Anemia in chronic kidney disease: Secondary | ICD-10-CM

## 2024-03-15 DIAGNOSIS — D500 Iron deficiency anemia secondary to blood loss (chronic): Secondary | ICD-10-CM | POA: Diagnosis not present

## 2024-03-15 DIAGNOSIS — Z923 Personal history of irradiation: Secondary | ICD-10-CM | POA: Insufficient documentation

## 2024-03-15 DIAGNOSIS — Z944 Liver transplant status: Secondary | ICD-10-CM | POA: Diagnosis not present

## 2024-03-15 DIAGNOSIS — I2699 Other pulmonary embolism without acute cor pulmonale: Secondary | ICD-10-CM

## 2024-03-15 DIAGNOSIS — E538 Deficiency of other specified B group vitamins: Secondary | ICD-10-CM

## 2024-03-15 DIAGNOSIS — C61 Malignant neoplasm of prostate: Secondary | ICD-10-CM | POA: Insufficient documentation

## 2024-03-15 DIAGNOSIS — R161 Splenomegaly, not elsewhere classified: Secondary | ICD-10-CM | POA: Insufficient documentation

## 2024-03-15 DIAGNOSIS — K7460 Unspecified cirrhosis of liver: Secondary | ICD-10-CM | POA: Insufficient documentation

## 2024-03-15 DIAGNOSIS — N1832 Chronic kidney disease, stage 3b: Secondary | ICD-10-CM | POA: Diagnosis present

## 2024-03-15 DIAGNOSIS — I82431 Acute embolism and thrombosis of right popliteal vein: Secondary | ICD-10-CM | POA: Insufficient documentation

## 2024-03-15 DIAGNOSIS — D696 Thrombocytopenia, unspecified: Secondary | ICD-10-CM | POA: Insufficient documentation

## 2024-03-15 DIAGNOSIS — D509 Iron deficiency anemia, unspecified: Secondary | ICD-10-CM | POA: Insufficient documentation

## 2024-03-15 DIAGNOSIS — Z7901 Long term (current) use of anticoagulants: Secondary | ICD-10-CM | POA: Insufficient documentation

## 2024-03-15 DIAGNOSIS — D61818 Other pancytopenia: Secondary | ICD-10-CM

## 2024-03-15 LAB — COMPREHENSIVE METABOLIC PANEL WITH GFR
ALT: 34 U/L (ref 0–44)
AST: 37 U/L (ref 15–41)
Albumin: 2.6 g/dL — ABNORMAL LOW (ref 3.5–5.0)
Alkaline Phosphatase: 132 U/L — ABNORMAL HIGH (ref 38–126)
Anion gap: 4 — ABNORMAL LOW (ref 5–15)
BUN: 62 mg/dL — ABNORMAL HIGH (ref 8–23)
CO2: 26 mmol/L (ref 22–32)
Calcium: 8.5 mg/dL — ABNORMAL LOW (ref 8.9–10.3)
Chloride: 105 mmol/L (ref 98–111)
Creatinine, Ser: 1.36 mg/dL — ABNORMAL HIGH (ref 0.61–1.24)
GFR, Estimated: 58 mL/min — ABNORMAL LOW (ref >60)
Glucose, Bld: 179 mg/dL — ABNORMAL HIGH (ref 70–99)
Potassium: 5 mmol/L (ref 3.5–5.1)
Sodium: 135 mmol/L (ref 135–145)
Total Bilirubin: 0.6 mg/dL (ref 0.0–1.2)
Total Protein: 6.5 g/dL (ref 6.5–8.1)

## 2024-03-15 LAB — FERRITIN: Ferritin: 294 ng/mL (ref 24–336)

## 2024-03-15 LAB — CBC WITH DIFFERENTIAL/PLATELET
Abs Immature Granulocytes: 0.1 10*3/uL — ABNORMAL HIGH (ref 0.00–0.07)
Basophils Absolute: 0 10*3/uL (ref 0.0–0.1)
Basophils Relative: 0 %
Eosinophils Absolute: 0.2 10*3/uL (ref 0.0–0.5)
Eosinophils Relative: 8 %
HCT: 26.3 % — ABNORMAL LOW (ref 39.0–52.0)
Hemoglobin: 8.4 g/dL — ABNORMAL LOW (ref 13.0–17.0)
Lymphocytes Relative: 44 %
Lymphs Abs: 1.1 10*3/uL (ref 0.7–4.0)
MCH: 30.8 pg (ref 26.0–34.0)
MCHC: 31.9 g/dL (ref 30.0–36.0)
MCV: 96.3 fL (ref 80.0–100.0)
Metamyelocytes Relative: 2 %
Monocytes Absolute: 0.2 10*3/uL (ref 0.1–1.0)
Monocytes Relative: 9 %
Neutro Abs: 0.9 10*3/uL — ABNORMAL LOW (ref 1.7–7.7)
Neutrophils Relative %: 37 %
Platelets: 146 10*3/uL — ABNORMAL LOW (ref 150–400)
RBC: 2.73 MIL/uL — ABNORMAL LOW (ref 4.22–5.81)
RDW: 17.4 % — ABNORMAL HIGH (ref 11.5–15.5)
WBC: 2.5 10*3/uL — ABNORMAL LOW (ref 4.0–10.5)
nRBC: 0 % (ref 0.0–0.2)

## 2024-03-15 LAB — VITAMIN B12: Vitamin B-12: 286 pg/mL (ref 180–914)

## 2024-03-15 LAB — IRON AND TIBC
Iron: 50 ug/dL (ref 45–182)
Saturation Ratios: 20 % (ref 17.9–39.5)
TIBC: 253 ug/dL (ref 250–450)
UIBC: 203 ug/dL

## 2024-03-15 MED ORDER — EPOETIN ALFA-EPBX 10000 UNIT/ML IJ SOLN
20000.0000 [IU] | Freq: Once | INTRAMUSCULAR | Status: AC
  Administered 2024-03-15: 20000 [IU] via SUBCUTANEOUS
  Filled 2024-03-15: qty 2

## 2024-03-15 MED ORDER — EPOETIN ALFA-EPBX 40000 UNIT/ML IJ SOLN: 20000.0000 [IU] | Freq: Once | INTRAMUSCULAR | Status: DC

## 2024-03-15 MED ORDER — VITAMIN B-12 1000 MCG PO TABS: 1000.0000 ug | ORAL_TABLET | Freq: Every day | ORAL | 3 refills | Status: AC

## 2024-03-15 NOTE — Progress Notes (Signed)
 Patient tolerated injection with no complaints voiced.  Site clean and dry with no bruising or swelling noted at site.  See MAR for details.  Band aid applied.  Patient stable during and after injection.  Vss with discharge and left in satisfactory condition with no s/s of distress noted.

## 2024-03-15 NOTE — Patient Instructions (Signed)
 [facility] at [facility] **VISIT SUMMARY & IMPORTANT INSTRUCTIONS **   You were seen today by Sheril Dines PA-C for your follow-up visit.   We will continue Retacrit  every 2 weeks. You should continue taking Eliquis for your blood clot for the foreseeable future.  FOLLOW-UP APPOINTMENT: 3 months  ** Thank you for trusting me with your healthcare!  I strive to provide all of my patients with quality care at each visit.  If you receive a survey for this visit, I would be so grateful to you for taking the time to provide feedback.  Thank you in advance!  ~ Dennisha Mouser                   Dr. Paulett Boros   &   Sheril Dines, PA-C   - - - - - - - - - - - - - - - - - -    Thank you for choosing  Cancer Center at [facility] to provide your oncology and hematology care.  To afford each patient quality time with our provider, please arrive at least 15 minutes before your scheduled appointment time.   If you have a lab appointment with the Cancer Center please come in thru the Main Entrance and check in at the main information desk.  You need to re-schedule your appointment should you arrive 10 or more minutes late.  We strive to give you quality time with our providers, and arriving late affects you and other patients whose appointments are after yours.  Also, if you no show three or more times for appointments you may be dismissed from the clinic at the providers discretion.     Again, thank you for choosing [facility].  Our hope is that these requests will decrease the amount of time that you wait before being seen by our physicians.       _____________________________________________________________  Should you have questions after your visit to [facility], please contact our office at [phone] and follow the prompts.  Our office hours are 8:00 a.m. and 4:30 p.m. Monday - Friday.  Please note that voicemails  left after 4:00 p.m. may not be returned until the following business day.  We are closed weekends and major holidays.  You do have access to a nurse 24-7, just call the main number to the clinic [phone] and do not press any options, hold on the line and a nurse will answer the phone.    For prescription refill requests, have your pharmacy contact our office and allow 72 hours.

## 2024-03-15 NOTE — Patient Instructions (Signed)

## 2024-03-16 ENCOUNTER — Encounter: Payer: Self-pay | Admitting: Internal Medicine

## 2024-03-16 ENCOUNTER — Ambulatory Visit: Payer: BC Managed Care – PPO | Admitting: Internal Medicine

## 2024-03-16 VITALS — BP 128/68 | HR 85 | Ht 67.0 in | Wt 168.0 lb

## 2024-03-16 DIAGNOSIS — E1169 Type 2 diabetes mellitus with other specified complication: Secondary | ICD-10-CM | POA: Diagnosis not present

## 2024-03-16 DIAGNOSIS — I2699 Other pulmonary embolism without acute cor pulmonale: Secondary | ICD-10-CM

## 2024-03-16 DIAGNOSIS — Z09 Encounter for follow-up examination after completed treatment for conditions other than malignant neoplasm: Secondary | ICD-10-CM | POA: Diagnosis not present

## 2024-03-16 DIAGNOSIS — Z931 Gastrostomy status: Secondary | ICD-10-CM

## 2024-03-16 DIAGNOSIS — Z794 Long term (current) use of insulin: Secondary | ICD-10-CM

## 2024-03-16 DIAGNOSIS — R410 Disorientation, unspecified: Secondary | ICD-10-CM

## 2024-03-16 DIAGNOSIS — N1832 Chronic kidney disease, stage 3b: Secondary | ICD-10-CM

## 2024-03-16 DIAGNOSIS — D631 Anemia in chronic kidney disease: Secondary | ICD-10-CM

## 2024-03-16 DIAGNOSIS — E039 Hypothyroidism, unspecified: Secondary | ICD-10-CM | POA: Diagnosis not present

## 2024-03-16 NOTE — Patient Instructions (Signed)
 Please start taking Novolog  5 U before meals. Please do not take insulin  if CGM shows blood glucose less than 150.  Please get blood tests done after 1 month.  Please apply heating pad and/or back brace for back pain.  Please continue to take other medications as prescribed.  Please continue to follow low carb diet and ambulate as tolerated.

## 2024-03-16 NOTE — Progress Notes (Unsigned)
 Established Patient Office Visit  Subjective:  Patient ID: Michael Harmon, male    DOB: [birth year]  Age: 63 y.o. MRN: [id number]  CC:  Chief Complaint  Patient presents with   Medical Management of Chronic Issues    6 Month f/u , reports hospital f/u as well, sx of fatigue. Concerns about hematoma on his back.     HPI Michael Harmon is a 64 y.o. male with past medical history of HTN, hepatic cirrhosis s/p liver transplant, portal hypertension, esophageal varices, celiac disease, type II DM, HLD, thrombocytopenia and IDA who presents for follow-up after recent hospitalization at [facility].  He has been hospitalized twice in the last 2 months for fatigue and AMS related to failure to thrive. He was last admitted from 02/19/24-03/06/24. He has had complicated post transplant course, c/b multiple admissions for hyperkalemia, volume overload with BLE edema, mechanical falls, and failure to thrive.  Recently, he had decreased appetite and severe protein calorie malnutrition.  He was having episodes of confusion, leading to hospitalization.  He has been getting physical deconditioning due to chronic fatigue, limited mobility and poor p.o. intake.  He was recently admitted for AMS/delirium.  Workup including MRI brain was negative for any acute pathology.  Due to his poor p.o. intake, he had a G-tube placed and is currently getting tube feeds in the evening.  His diet was gradually advanced during hospitalization.   In between the 2 hospitalizations, he was placed in a SAR, where he continued to have spells of confusion.  He had a fall, leading to gluteal hematoma.  He has a history of chronic anemia.  He received 3 U PRBC blood transfusions for anemia in the last hospitalization.  Of note, he had PE in 03/25 and has been placed on Eliquis 5 mg twice daily currently.  Denies any episode of bleeding.  Type II DM: His blood glucose had been well-controlled without insulin  till now, but states that it  has been going above 200 frequently due to tube feeds.    Past Medical History:  Diagnosis Date   Allergy    nose and sinus problems   Anemia    Anemia in chronic kidney disease (CKD) 11/23/2022   Asthma    Celiac disease    Cirrhosis, cryptogenic (HCC)    completed Hep A and B vaccines   DM (diabetes mellitus) (HCC)    Encounter for general adult medical examination with abnormal findings 09/08/2022   GERD (gastroesophageal reflux disease)    Hyperlipidemia    diet controlled now with 100 pound weight loss   Hypertension    Hypothyroidism 03/17/2024   Iron  deficiency anemia due to chronic blood loss 08/13/2021   Malignant neoplasm of prostate (HCC) 10/03/2021   Murmur, heart 03/02/2022   Narrow angle glaucoma suspect of both eyes    Renal cyst 09/03/2017   right   Situational depression 07/06/2017   Splenomegaly    Thrombocytopenia (HCC)    hematology, ?related to chol med (tricor) and glimeripide, just being monitored, above 100,000    Past Surgical History:  Procedure Laterality Date   BIOPSY  07/09/2016   Procedure: BIOPSY;  Surgeon: Suzette Espy, MD;  [facility];  Service: Endoscopy;;  duodenal, gastric, esophaageal   BIOPSY  07/31/2021   Procedure: BIOPSY;  Surgeon: Suzette Espy, MD;  [facility];  Service: Endoscopy;;   CLOSED MANIPULATION SHOULDER     COLONOSCOPY  11/2015   Dr. Lydia Sams: cecal  bx neg for microscopic colitis. ascending colon polyp (adenomatous)   COLONOSCOPY WITH PROPOFOL  N/A 07/31/2021   Procedure: COLONOSCOPY WITH PROPOFOL ;  Surgeon: Suzette Espy, MD;  [facility];  Service: Endoscopy;  Laterality: N/A;  7:30am   ESOPHAGEAL BANDING N/A 08/04/2017   Procedure: ESOPHAGEAL BANDING;  Surgeon: Suzette Espy, MD;  [facility];  Service: Endoscopy;  Laterality: N/A;  esophageal varices banding   ESOPHAGEAL BANDING N/A 10/13/2017   Procedure: ESOPHAGEAL BANDING;  Surgeon: Suzette Espy, MD;  [facility];  Service: Endoscopy;  Laterality: N/A;   ESOPHAGEAL BANDING N/A 04/09/2020   Procedure: ESOPHAGEAL BANDING;  Surgeon: Suzette Espy, MD;  [facility];  Service: Endoscopy;  Laterality: N/A;   ESOPHAGOGASTRODUODENOSCOPY N/A 07/09/2016   Procedure: ESOPHAGOGASTRODUODENOSCOPY (EGD);  Surgeon: Suzette Espy, MD;  [facility];  Service: Endoscopy;  Laterality: N/A;  730   ESOPHAGOGASTRODUODENOSCOPY N/A 08/04/2017   Procedure: ESOPHAGOGASTRODUODENOSCOPY (EGD);  Surgeon: Suzette Espy, MD;  [facility];  Service: Endoscopy;  Laterality: N/A;  7:30am   ESOPHAGOGASTRODUODENOSCOPY N/A 10/13/2017   Dr. Riley Cheadle: grade 2 esophageal varices status post banding, portal hypertensive gastropathy   ESOPHAGOGASTRODUODENOSCOPY  11/23/2019   [name]: Esophageal varices, 4 columns of grade 2-3 status post banding.  Varices more prominent than seen in December 2018.  Portal gastropathy.  Normal-appearing small bowel.   ESOPHAGOGASTRODUODENOSCOPY (EGD) WITH PROPOFOL  N/A 04/09/2020   Dr. Riley Cheadle: 4 columns of grade 2/grade 3 esophageal varices somewhat recalcitrant.  Status post esophageal band ligation today.  Portal gastropathy.  Normal-appearing small bowel.   ESOPHAGOGASTRODUODENOSCOPY (EGD) WITH PROPOFOL  N/A 07/31/2021   Procedure: ESOPHAGOGASTRODUODENOSCOPY (EGD) WITH PROPOFOL ;  Surgeon: Suzette Espy, MD;  [facility];  Service: Endoscopy;  Laterality: N/A;   GOLD SEED IMPLANT N/A 01/15/2022   Procedure: GOLD SEED IMPLANT;  Surgeon: Marco Severs, MD;  Location: [facility];  Service: Urology;  Laterality: N/A;   LIVER TRANSPLANT  01/16/2023   SPACE OAR INSTILLATION N/A 01/15/2022   Procedure: SPACE OAR INSTILLATION;  Surgeon: Marco Severs, MD;  Location: [facility];  Service: Urology;  Laterality: N/A;   US  PARACENTESIS  06/10/2022    Family History  Problem Relation Age of Onset   Other Mother        chronic diarrhea   COPD Mother    Heart disease  Mother        died at 33   Arthritis Mother    Cancer Mother    Depression Mother    Diabetes Mother    Hyperlipidemia Mother    Hypertension Mother    Stroke Mother    Arthritis Father    Asthma Father    Birth defects Father    Heart disease Father        aortic valve replaced   Heart disease Maternal Grandmother    Stroke Maternal Grandmother    Heart disease Maternal Grandfather    Stroke Maternal Grandfather    Diabetes Paternal Grandmother    Stroke Paternal Grandfather    Heart disease Paternal Grandfather    Liver disease Neg Hx    Colon cancer Neg Hx     Social History   Socioeconomic History   Marital status: Married    Spouse name: Melissa   Number of children: 0   Years of education: 14   Highest education level: Associate degree: occupational, Scientist, product/process development, or vocational program  Occupational History   Occupation: Engineer, materials  technician, 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   Has no children.   Eats all food groups.   Works for [employer]       Social Drivers of Corporate investment banker Strain: Low Risk  (03/02/2024)   Received from [facility]   Overall Financial Resource Strain (CARDIA)    Difficulty of Paying Living Expenses: Not hard at all  Food Insecurity: No Food Insecurity (03/08/2024)   Hunger Vital Sign    Worried About Running Out of Food in the Last Year: Never true    Ran Out of Food in the Last Year: Never true  Transportation Needs: No Transportation Needs (03/08/2024)   PRAPARE - Administrator, Civil Service (Medical): No    Lack of Transportation (Non-Medical): No   Physical Activity: Insufficiently Active (12/07/2023)   Exercise Vital Sign    Days of Exercise per Week: 2 days    Minutes of Exercise per Session: 30 min  Stress: Stress Concern Present (12/07/2023)   [employer] of Occupational Health - Occupational Stress Questionnaire    Feeling of Stress : To some extent  Social Connections: Socially Integrated (12/07/2023)   Social Connection and Isolation Panel [NHANES]    Frequency of Communication with Friends and Family: More than three times a week    Frequency of Social Gatherings with Friends and Family: Twice a week    Attends Religious Services: More than 4 times per year    Active Member of [employer] or Organizations: Yes    Attends Engineer, structural: More than 4 times per year    Marital Status: Married  Catering manager Violence: Patient Unable To Answer (03/08/2024)   Humiliation, Afraid, Rape, and Kick questionnaire    Fear of Current or Ex-Partner: Patient unable to answer    Emotionally Abused: Patient unable to answer    Physically Abused: Patient unable to answer    Sexually Abused: Patient unable to answer    Outpatient Medications Prior to Visit  Medication Sig Dispense Refill   acetaminophen  (TYLENOL ) 325 MG tablet Take 650 mg by mouth every 8 (eight) hours as needed.     albuterol (VENTOLIN HFA) 108 (90 Base) MCG/ACT inhaler Inhale 2 puffs into the lungs every 6 (six) hours as needed for wheezing or shortness of breath.     ammonium lactate (LAC-HYDRIN) 12 % lotion Apply 1 Application topically daily.     apixaban (ELIQUIS) 5 MG TABS tablet Take 5 mg by mouth 2 (two) times daily.     aspirin  EC 81 MG tablet Take by mouth.     Calcium  Citrate-Vitamin D  (CALCIUM  CITRATE + D3 PO) Take 2 tablets by mouth 2 (two) times daily.     Continuous Glucose Sensor (FREESTYLE LIBRE 2 SENSOR) MISC See admin instructions.     Copper  Gluconate 2 MG TABS Take 2 mg by mouth daily.     cyanocobalamin  (VITAMIN B12) 1000 MCG tablet Take  1 tablet (1,000 mcg total) by mouth daily. 90 tablet 3   diphenoxylate-atropine (LOMOTIL) 2.5-0.025 MG tablet Take 1 tablet  by mouth 3 (three) times daily.     furosemide  (LASIX ) 40 MG tablet Take 40 mg by mouth 2 (two) times daily.     levothyroxine (SYNTHROID) 50 MCG tablet Take 50 mcg by mouth daily before breakfast.     Lido-Capsaicin-Men-Methyl Sal (MEDI-PATCH-LIDOCAINE  EX) Apply 1 patch topically as directed.     LOKELMA  5 g packet daily.     loperamide (IMODIUM) 2 MG capsule Take 2 mg by mouth.     MAGNESIUM-OXIDE PO Take 266 mg by mouth.     MELATONIN ER PO Take 9 mg by mouth at bedtime.     Melatonin-Pyridoxine (MELATIN PO) Take 9 mg by mouth at bedtime.     Multiple Vitamin (MULTIVITAMIN WITH MINERALS) TABS tablet Take 1 tablet by mouth daily.     NON FORMULARY Tube feedings from 6pm-8am  via tube feeding pump.     ondansetron  (ZOFRAN -ODT) 4 MG disintegrating tablet Take 4 mg by mouth every 8 (eight) hours as needed.     Pancrelipase, Lip-Prot-Amyl, 25000-79000 units CPEP Take 2 capsules by mouth.     pantoprazole  (PROTONIX ) 40 MG tablet Take 40 mg by mouth 2 (two) times daily.     pyridoxine (B-6) 100 MG tablet Take 100 mg by mouth daily.     simethicone  (MYLICON) 80 MG chewable tablet Chew 80 mg by mouth.     tacrolimus  (PROGRAF ) 1 MG capsule Take 2 mg by mouth 2 (two) times daily.     tamsulosin  (FLOMAX ) 0.4 MG CAPS capsule Take 0.4 mg by mouth daily.     UNABLE TO FIND Med Name: Magnesium oxide-magnesium amino acid chelate 266 mg BID     zinc sulfate, 50mg  elemental zinc, 220 (50 Zn) MG capsule Take 220 mg by mouth.     amitriptyline (ELAVIL) 25 MG tablet Take 25 mg by mouth at bedtime.     cefUROXime (CEFTIN) 500 MG tablet Take 500 mg by mouth 2 (two) times daily with a meal.     gabapentin (NEURONTIN) 300 MG capsule Take 200 mg by mouth 2 (two) times daily.     VELTASSA 8.4 g packet Take 1 packet by mouth daily.     zinc gluconate 50 MG tablet Take 220 mg by mouth daily.      No facility-administered medications prior to visit.    Allergies  Allergen Reactions   Penicillins Other (See Comments)    As a baby, unknown reaction Did it involve swelling of the face/tongue/throat, SOB, or low BP? Unknown Did it involve sudden or severe rash/hives, skin peeling, or any reaction on the inside of your mouth or nose? Unknown Did you need to seek medical attention at a hospital or doctor's office? Unknown When did it last happen?      infant allergy If all above answers are "NO", may proceed with cephalosporin use. .    Gluten Meal Diarrhea   Biaxin [Clarithromycin] Tinitus    Ears ringing, loss sense of smell    ROS Review of Systems  Constitutional:  Positive for fatigue. Negative for chills and fever.  HENT:  Negative for congestion and sore throat.   Eyes:  Negative for pain and discharge.  Respiratory:  Negative for cough and shortness of breath.   Cardiovascular:  Positive for leg swelling. Negative for chest pain and palpitations.  Gastrointestinal:  Positive for diarrhea. Negative for nausea and vomiting.  Endocrine: Negative for polydipsia and polyuria.  Genitourinary:  Negative for dysuria and hematuria.  Musculoskeletal:  Negative for  neck pain and neck stiffness.  Skin:  Negative for rash.  Neurological:  Negative for dizziness and weakness.  Psychiatric/Behavioral:  Positive for sleep disturbance. Negative for agitation and behavioral problems.       Objective:     Physical Exam Vitals reviewed.  Constitutional:      General: He is not in acute distress.    Appearance: He is not diaphoretic.  HENT:     Head: Normocephalic and atraumatic.     Nose: Nose normal.     Mouth/Throat:     Mouth: Mucous membranes are moist.  Eyes:     General: No scleral icterus.    Extraocular Movements: Extraocular movements intact.  Cardiovascular:     Rate and Rhythm: Normal rate and regular rhythm.     Heart sounds: Normal heart sounds. No murmur  heard. Pulmonary:     Breath sounds: Normal breath sounds. No wheezing or rales.  Abdominal:     Comments: PEG tube in place  Musculoskeletal:     Cervical back: Neck supple. No tenderness.     Right lower leg: Edema (1+) present.     Left lower leg: Edema (1+) present.  Skin:    General: Skin is warm.     Findings: No rash.  Neurological:     General: No focal deficit present.     Mental Status: He is alert and oriented to person, place, and time.     Sensory: No sensory deficit.     Motor: Weakness (B/l LE - 4/5) present.  Psychiatric:        Mood and Affect: Mood normal.        Behavior: Behavior normal.     BP 128/68 (BP Location: Left Arm)   Pulse 85   Ht 5\' 7"  (1.702 m)   Wt 168 lb (76.2 kg)   SpO2 96%   BMI 26.31 kg/m  Wt Readings from Last 3 Encounters:  03/16/24 168 lb (76.2 kg)  03/15/24 174 lb 6.1 oz (79.1 kg)  03/08/24 174 lb (78.9 kg)    Lab Results  Component Value Date   TSH 3.222 09/24/2023   Lab Results  Component Value Date   WBC 2.5 (L) 03/15/2024   HGB 8.4 (L) 03/15/2024   HCT 26.3 (L) 03/15/2024   MCV 96.3 03/15/2024   PLT 146 (L) 03/15/2024   Lab Results  Component Value Date   NA 135 03/15/2024   K 5.0 03/15/2024   CO2 26 03/15/2024   GLUCOSE 179 (H) 03/15/2024   BUN 62 (H) 03/15/2024   CREATININE 1.36 (H) 03/15/2024   BILITOT 0.6 03/15/2024   ALKPHOS 132 (H) 03/15/2024   AST 37 03/15/2024   ALT 34 03/15/2024   PROT 6.5 03/15/2024   ALBUMIN  2.6 (L) 03/15/2024   CALCIUM  8.5 (L) 03/15/2024   ANIONGAP 4 (L) 03/15/2024   Lab Results  Component Value Date   CHOL 60 09/24/2023   Lab Results  Component Value Date   HDL 37 (L) 09/24/2023   Lab Results  Component Value Date   LDLCALC 18 09/24/2023   Lab Results  Component Value Date   TRIG 27 09/24/2023   Lab Results  Component Value Date   CHOLHDL 1.6 09/24/2023   Lab Results  Component Value Date   HGBA1C 4.8 03/16/2024      Assessment & Plan:   Problem List  Items Addressed This Visit       Cardiovascular and Mediastinum   Pulmonary embolus (HCC)  Noted in 03/25 On Eliquis 5 mg BID -followed by hematology at [facility] health        Endocrine   Type 2 diabetes mellitus with other specified complication (HCC)   HbA1c: 4.8  Recent hyperglycemic spells likely due to tube feeds, follow-up with nutritionist Associated with HLD Restart NovoLog  at 5 U with meals - hold if blood glucose less than 150, followed by Endocrinology in [facility] endocrinology - had not required insulin  since liver transplant Advised to follow diabetic diet Not on statin due to liver cirrhosis Recent CMP reviewed from chart Diabetic eye exam: Advised to follow up with Ophthalmology for diabetic eye exam      Relevant Orders   Microalbumin / creatinine urine ratio   Bayer DCA Hb A1c Waived (Completed)   CMP14+EGFR   Hypothyroidism   Last TSH elevated during recent hospitalization, likely due to acute stressor Continue levothyroxine 50 mcg QD Check TSH and free T4 after 1 month      Relevant Orders   TSH + free T4   CMP14+EGFR     Nervous and Auditory   Delirium - Primary   Improved now, recent hospitalization charts reviewed Likely due to failure to thrive Has G-tube now, maintain adequate hydration, follow-up with nutritionist      Relevant Orders   TSH + free T4   CMP14+EGFR     Genitourinary   Anemia in chronic kidney disease (CKD)   S/p 3 U PRBC recently Last CBC reviewed        Other   Hospital discharge follow-up   Hospital chart reviewed, including discharge summary Medications reconciled and reviewed with the patient in detail      Relevant Orders   TSH + free T4   CMP14+EGFR   PEG (percutaneous endoscopic gastrostomy) status (HCC)   For improving nutrition Followed by GI and Nutrition team at [facility] health       No orders of the defined types were placed in this encounter.   Follow-up: Return in about 6 months (around 09/16/2024)  for Annual physical (after 09/16/24).    Meldon Sport, MD

## 2024-03-17 ENCOUNTER — Encounter: Payer: Self-pay | Admitting: Internal Medicine

## 2024-03-17 ENCOUNTER — Ambulatory Visit: Payer: Self-pay | Admitting: Internal Medicine

## 2024-03-17 DIAGNOSIS — E039 Hypothyroidism, unspecified: Secondary | ICD-10-CM

## 2024-03-17 DIAGNOSIS — I2699 Other pulmonary embolism without acute cor pulmonale: Secondary | ICD-10-CM | POA: Insufficient documentation

## 2024-03-17 DIAGNOSIS — Z931 Gastrostomy status: Secondary | ICD-10-CM | POA: Insufficient documentation

## 2024-03-17 HISTORY — DX: Hypothyroidism, unspecified: E03.9

## 2024-03-17 LAB — BAYER DCA HB A1C WAIVED: HB A1C (BAYER DCA - WAIVED): 4.8 % (ref 4.8–5.6)

## 2024-03-17 LAB — METHYLMALONIC ACID, SERUM: Methylmalonic Acid, Quantitative: 382 nmol/L — ABNORMAL HIGH (ref 0–378)

## 2024-03-17 NOTE — Assessment & Plan Note (Signed)
 HbA1c: 4.8  Recent hyperglycemic spells likely due to tube feeds, follow-up with nutritionist Associated with HLD Restart NovoLog  at 5 U with meals - hold if blood glucose less than 150, followed by Endocrinology in [facility] endocrinology - had not required insulin  since liver transplant Advised to follow diabetic diet Not on statin due to liver cirrhosis Recent CMP reviewed from chart Diabetic eye exam: Advised to follow up with Ophthalmology for diabetic eye exam

## 2024-03-17 NOTE — Assessment & Plan Note (Signed)
 Noted in 03/25 On Eliquis 5 mg BID -followed by hematology at [facility]

## 2024-03-17 NOTE — Assessment & Plan Note (Addendum)
 Last TSH elevated during recent hospitalization, likely due to acute stressor Continue levothyroxine 50 mcg QD Check TSH and free T4 after 1 month

## 2024-03-17 NOTE — Assessment & Plan Note (Signed)
 Hospital chart reviewed, including discharge summary Medications reconciled and reviewed with the patient in detail

## 2024-03-17 NOTE — Assessment & Plan Note (Signed)
 For improving nutrition Followed by GI and Nutrition team at [facility] health

## 2024-03-17 NOTE — Assessment & Plan Note (Signed)
 Improved now, recent hospitalization charts reviewed Likely due to failure to thrive Has G-tube now, maintain adequate hydration, follow-up with nutritionist

## 2024-03-17 NOTE — Assessment & Plan Note (Signed)
 S/p 3 U PRBC recently Last CBC reviewed

## 2024-03-18 LAB — MICROALBUMIN / CREATININE URINE RATIO
Creatinine, Urine: 73.9 mg/dL
Microalb/Creat Ratio: 1395 mg/g{creat} — ABNORMAL HIGH (ref 0–29)
Microalbumin, Urine: 1031.1 ug/mL

## 2024-03-21 NOTE — Telephone Encounter (Signed)
 Reached out to patient with advise sent to him per Sheril Dines, PAC.  Verbalized understanding and states that he started B12 once it was sent to pharmacy.

## 2024-03-22 ENCOUNTER — Inpatient Hospital Stay: Payer: BC Managed Care – PPO | Admitting: Physician Assistant

## 2024-03-22 ENCOUNTER — Other Ambulatory Visit (HOSPITAL_COMMUNITY)
Admission: RE | Admit: 2024-03-22 | Discharge: 2024-03-22 | Disposition: A | Discharge status: Home / Self Care | Source: Ambulatory Visit | Attending: Gastroenterology | Admitting: Gastroenterology

## 2024-03-22 DIAGNOSIS — Z944 Liver transplant status: Secondary | ICD-10-CM | POA: Diagnosis present

## 2024-03-22 LAB — CBC WITH DIFFERENTIAL/PLATELET
Abs Immature Granulocytes: 0.02 10*3/uL (ref 0.00–0.07)
Basophils Absolute: 0 10*3/uL (ref 0.0–0.1)
Basophils Relative: 0 %
Eosinophils Absolute: 0.2 10*3/uL (ref 0.0–0.5)
Eosinophils Relative: 6 %
HCT: 27.6 % — ABNORMAL LOW (ref 39.0–52.0)
Hemoglobin: 8.8 g/dL — ABNORMAL LOW (ref 13.0–17.0)
Immature Granulocytes: 1 %
Lymphocytes Relative: 37 %
Lymphs Abs: 1.4 10*3/uL (ref 0.7–4.0)
MCH: 32 pg (ref 26.0–34.0)
MCHC: 31.9 g/dL (ref 30.0–36.0)
MCV: 100.4 fL — ABNORMAL HIGH (ref 80.0–100.0)
Monocytes Absolute: 0.4 10*3/uL (ref 0.1–1.0)
Monocytes Relative: 10 %
Neutro Abs: 1.7 10*3/uL (ref 1.7–7.7)
Neutrophils Relative %: 46 %
Platelets: 139 10*3/uL — ABNORMAL LOW (ref 150–400)
RBC: 2.75 MIL/uL — ABNORMAL LOW (ref 4.22–5.81)
RDW: 18.6 % — ABNORMAL HIGH (ref 11.5–15.5)
WBC: 3.7 10*3/uL — ABNORMAL LOW (ref 4.0–10.5)
nRBC: 0 % (ref 0.0–0.2)

## 2024-03-22 LAB — COMPREHENSIVE METABOLIC PANEL WITH GFR
ALT: 35 U/L (ref 0–44)
AST: 36 U/L (ref 15–41)
Albumin: 2.7 g/dL — ABNORMAL LOW (ref 3.5–5.0)
Alkaline Phosphatase: 159 U/L — ABNORMAL HIGH (ref 38–126)
Anion gap: 7 (ref 5–15)
BUN: 66 mg/dL — ABNORMAL HIGH (ref 8–23)
CO2: 24 mmol/L (ref 22–32)
Calcium: 8.7 mg/dL — ABNORMAL LOW (ref 8.9–10.3)
Chloride: 101 mmol/L (ref 98–111)
Creatinine, Ser: 1.32 mg/dL — ABNORMAL HIGH (ref 0.61–1.24)
GFR, Estimated: >60 mL/min (ref >60)
Glucose, Bld: 158 mg/dL — ABNORMAL HIGH (ref 70–99)
Potassium: 4.7 mmol/L (ref 3.5–5.1)
Sodium: 132 mmol/L — ABNORMAL LOW (ref 135–145)
Total Bilirubin: 0.5 mg/dL (ref 0.0–1.2)
Total Protein: 6.4 g/dL — ABNORMAL LOW (ref 6.5–8.1)

## 2024-03-22 LAB — MAGNESIUM: Magnesium: 1.6 mg/dL — ABNORMAL LOW (ref 1.7–2.4)

## 2024-03-24 LAB — TACROLIMUS LEVEL: Tacrolimus (FK506) - LabCorp: 4 ng/mL — ABNORMAL LOW (ref 5.0–20.0)

## 2024-03-28 ENCOUNTER — Telehealth: Payer: Self-pay

## 2024-03-28 ENCOUNTER — Other Ambulatory Visit: Payer: Self-pay | Admitting: Internal Medicine

## 2024-03-28 DIAGNOSIS — I6529 Occlusion and stenosis of unspecified carotid artery: Secondary | ICD-10-CM

## 2024-03-28 DIAGNOSIS — H3563 Retinal hemorrhage, bilateral: Secondary | ICD-10-CM

## 2024-03-28 NOTE — Telephone Encounter (Signed)
Pt informed

## 2024-03-28 NOTE — Telephone Encounter (Signed)
 Copied from CRM [id number]. Topic: Appointments - Appointment Scheduling >> Mar 28, 2024 10:16 AM Emylou G wrote: Having trouble seeing.. was told he wasn't getting enough blood flow for his eyes - per at the eye center.. was told to get vascular tests.Aaron Aas but wanted to not make appt until he heard from us .. Pls call him

## 2024-03-30 ENCOUNTER — Other Ambulatory Visit: Payer: Self-pay | Admitting: Physician Assistant

## 2024-03-30 ENCOUNTER — Inpatient Hospital Stay

## 2024-03-30 VITALS — BP 144/61 | HR 71 | Temp 97.7°F | Resp 18

## 2024-03-30 DIAGNOSIS — E538 Deficiency of other specified B group vitamins: Secondary | ICD-10-CM

## 2024-03-30 DIAGNOSIS — D631 Anemia in chronic kidney disease: Secondary | ICD-10-CM

## 2024-03-30 DIAGNOSIS — N1832 Chronic kidney disease, stage 3b: Secondary | ICD-10-CM | POA: Diagnosis not present

## 2024-03-30 LAB — CBC
HCT: 29.2 % — ABNORMAL LOW (ref 39.0–52.0)
Hemoglobin: 8.9 g/dL — ABNORMAL LOW (ref 13.0–17.0)
MCH: 30.4 pg (ref 26.0–34.0)
MCHC: 30.5 g/dL (ref 30.0–36.0)
MCV: 99.7 fL (ref 80.0–100.0)
Platelets: 149 10*3/uL — ABNORMAL LOW (ref 150–400)
RBC: 2.93 MIL/uL — ABNORMAL LOW (ref 4.22–5.81)
RDW: 17.4 % — ABNORMAL HIGH (ref 11.5–15.5)
WBC: 2.8 10*3/uL — ABNORMAL LOW (ref 4.0–10.5)
nRBC: 0 % (ref 0.0–0.2)

## 2024-03-30 MED ORDER — EPOETIN ALFA-EPBX 10000 UNIT/ML IJ SOLN
20000.0000 [IU] | Freq: Once | INTRAMUSCULAR | Status: AC
  Administered 2024-03-30: 20000 [IU] via SUBCUTANEOUS
  Filled 2024-03-30: qty 2

## 2024-03-30 MED ORDER — EPOETIN ALFA-EPBX 40000 UNIT/ML IJ SOLN: 20000.0000 [IU] | Freq: Once | INTRAMUSCULAR | Status: DC

## 2024-03-30 NOTE — Patient Instructions (Signed)

## 2024-03-30 NOTE — Telephone Encounter (Signed)
 Patient came by the office said has not yet heard back about his eye order. Asking for a call back at [phone]

## 2024-03-30 NOTE — Progress Notes (Signed)
 Patient's Hgb 8.9 and blood pressure stable. Patient tolerated Retacrit  injection with no complaints voiced.  Site clean and dry with no bruising or swelling noted at site.  See MAR for details.  Band aid applied.  Patient stable during and after injection.  Vss with discharge and left in satisfactory condition with no s/s of distress noted. All follow ups as scheduled.  Michael Harmon

## 2024-03-30 NOTE — Addendum Note (Signed)
 Addended byCleola Dach on: 03/30/2024 07:46 AM   Modules accepted: Level of Service

## 2024-03-31 NOTE — Telephone Encounter (Signed)
 Patient is schedule for Carotid US  and is aware of Appt information.

## 2024-04-01 ENCOUNTER — Encounter: Payer: Self-pay | Admitting: Hematology

## 2024-04-06 ENCOUNTER — Ambulatory Visit (HOSPITAL_COMMUNITY)
Admission: RE | Admit: 2024-04-06 | Discharge: 2024-04-06 | Disposition: A | Discharge status: Home / Self Care | Source: Ambulatory Visit | Attending: Internal Medicine | Admitting: Internal Medicine

## 2024-04-06 DIAGNOSIS — I6529 Occlusion and stenosis of unspecified carotid artery: Secondary | ICD-10-CM | POA: Insufficient documentation

## 2024-04-06 DIAGNOSIS — H3563 Retinal hemorrhage, bilateral: Secondary | ICD-10-CM | POA: Insufficient documentation

## 2024-04-09 ENCOUNTER — Ambulatory Visit: Payer: Self-pay | Admitting: Internal Medicine

## 2024-04-10 ENCOUNTER — Other Ambulatory Visit: Payer: Self-pay | Admitting: Internal Medicine

## 2024-04-10 DIAGNOSIS — Z944 Liver transplant status: Secondary | ICD-10-CM

## 2024-04-10 DIAGNOSIS — R5381 Other malaise: Secondary | ICD-10-CM

## 2024-04-10 DIAGNOSIS — E8809 Other disorders of plasma-protein metabolism, not elsewhere classified: Secondary | ICD-10-CM

## 2024-04-10 DIAGNOSIS — R296 Repeated falls: Secondary | ICD-10-CM

## 2024-04-10 DIAGNOSIS — N1832 Chronic kidney disease, stage 3b: Secondary | ICD-10-CM

## 2024-04-12 ENCOUNTER — Inpatient Hospital Stay: Attending: Physician Assistant

## 2024-04-12 ENCOUNTER — Other Ambulatory Visit (HOSPITAL_COMMUNITY)
Admission: RE | Admit: 2024-04-12 | Discharge: 2024-04-12 | Disposition: A | Discharge status: Home / Self Care | Source: Ambulatory Visit | Attending: Gastroenterology | Admitting: Gastroenterology

## 2024-04-12 ENCOUNTER — Other Ambulatory Visit (HOSPITAL_COMMUNITY)
Admission: RE | Admit: 2024-04-12 | Discharge: 2024-04-12 | Disposition: A | Discharge status: Home / Self Care | Source: Ambulatory Visit | Attending: Internal Medicine | Admitting: Internal Medicine

## 2024-04-12 ENCOUNTER — Inpatient Hospital Stay

## 2024-04-12 VITALS — BP 138/59 | HR 73 | Temp 97.7°F | Resp 18

## 2024-04-12 DIAGNOSIS — Z944 Liver transplant status: Secondary | ICD-10-CM | POA: Diagnosis present

## 2024-04-12 DIAGNOSIS — Z4823 Encounter for aftercare following liver transplant: Secondary | ICD-10-CM | POA: Insufficient documentation

## 2024-04-12 DIAGNOSIS — D849 Immunodeficiency, unspecified: Secondary | ICD-10-CM | POA: Insufficient documentation

## 2024-04-12 DIAGNOSIS — E139 Other specified diabetes mellitus without complications: Secondary | ICD-10-CM | POA: Insufficient documentation

## 2024-04-12 DIAGNOSIS — N1832 Chronic kidney disease, stage 3b: Secondary | ICD-10-CM | POA: Insufficient documentation

## 2024-04-12 DIAGNOSIS — D631 Anemia in chronic kidney disease: Secondary | ICD-10-CM | POA: Diagnosis not present

## 2024-04-12 LAB — COMPREHENSIVE METABOLIC PANEL WITH GFR
ALT: 33 U/L (ref 0–44)
ALT: 33 U/L (ref 0–44)
AST: 27 U/L (ref 15–41)
AST: 27 U/L (ref 15–41)
Albumin: 3.2 g/dL — ABNORMAL LOW (ref 3.5–5.0)
Albumin: 3.2 g/dL — ABNORMAL LOW (ref 3.5–5.0)
Alkaline Phosphatase: 155 U/L — ABNORMAL HIGH (ref 38–126)
Alkaline Phosphatase: 156 U/L — ABNORMAL HIGH (ref 38–126)
Anion gap: 6 (ref 5–15)
Anion gap: 7 (ref 5–15)
BUN: 65 mg/dL — ABNORMAL HIGH (ref 8–23)
BUN: 65 mg/dL — ABNORMAL HIGH (ref 8–23)
CO2: 21 mmol/L — ABNORMAL LOW (ref 22–32)
CO2: 22 mmol/L (ref 22–32)
Calcium: 9.1 mg/dL (ref 8.9–10.3)
Calcium: 9.1 mg/dL (ref 8.9–10.3)
Chloride: 108 mmol/L (ref 98–111)
Chloride: 108 mmol/L (ref 98–111)
Creatinine, Ser: 1.45 mg/dL — ABNORMAL HIGH (ref 0.61–1.24)
Creatinine, Ser: 1.46 mg/dL — ABNORMAL HIGH (ref 0.61–1.24)
GFR, Estimated: 54 mL/min — ABNORMAL LOW (ref >60)
GFR, Estimated: 54 mL/min — ABNORMAL LOW (ref >60)
Glucose, Bld: 146 mg/dL — ABNORMAL HIGH (ref 70–99)
Glucose, Bld: 146 mg/dL — ABNORMAL HIGH (ref 70–99)
Potassium: 5.8 mmol/L — ABNORMAL HIGH (ref 3.5–5.1)
Potassium: 5.9 mmol/L — ABNORMAL HIGH (ref 3.5–5.1)
Sodium: 136 mmol/L (ref 135–145)
Sodium: 136 mmol/L (ref 135–145)
Total Bilirubin: 0.4 mg/dL (ref 0.0–1.2)
Total Bilirubin: 0.5 mg/dL (ref 0.0–1.2)
Total Protein: 6.7 g/dL (ref 6.5–8.1)
Total Protein: 6.7 g/dL (ref 6.5–8.1)

## 2024-04-12 LAB — CBC WITH DIFFERENTIAL/PLATELET
Abs Immature Granulocytes: 0.01 10*3/uL (ref 0.00–0.07)
Basophils Absolute: 0 10*3/uL (ref 0.0–0.1)
Basophils Relative: 0 %
Eosinophils Absolute: 0.1 10*3/uL (ref 0.0–0.5)
Eosinophils Relative: 4 %
HCT: 30.9 % — ABNORMAL LOW (ref 39.0–52.0)
Hemoglobin: 9.6 g/dL — ABNORMAL LOW (ref 13.0–17.0)
Immature Granulocytes: 0 %
Lymphocytes Relative: 35 %
Lymphs Abs: 1.1 10*3/uL (ref 0.7–4.0)
MCH: 30.2 pg (ref 26.0–34.0)
MCHC: 31.1 g/dL (ref 30.0–36.0)
MCV: 97.2 fL (ref 80.0–100.0)
Monocytes Absolute: 0.3 10*3/uL (ref 0.1–1.0)
Monocytes Relative: 9 %
Neutro Abs: 1.6 10*3/uL — ABNORMAL LOW (ref 1.7–7.7)
Neutrophils Relative %: 52 %
Platelets: 118 10*3/uL — ABNORMAL LOW (ref 150–400)
RBC: 3.18 MIL/uL — ABNORMAL LOW (ref 4.22–5.81)
RDW: 15.9 % — ABNORMAL HIGH (ref 11.5–15.5)
WBC: 3.2 10*3/uL — ABNORMAL LOW (ref 4.0–10.5)
nRBC: 0 % (ref 0.0–0.2)

## 2024-04-12 LAB — LIPID PANEL
Cholesterol: 108 mg/dL (ref 0–200)
HDL: 41 mg/dL (ref >40)
LDL Cholesterol: 54 mg/dL (ref 0–99)
Total CHOL/HDL Ratio: 2.6 ratio
Triglycerides: 64 mg/dL (ref <150)
VLDL: 13 mg/dL (ref 0–40)

## 2024-04-12 LAB — CBC
HCT: 30.9 % — ABNORMAL LOW (ref 39.0–52.0)
Hemoglobin: 9.9 g/dL — ABNORMAL LOW (ref 13.0–17.0)
MCH: 31.3 pg (ref 26.0–34.0)
MCHC: 32 g/dL (ref 30.0–36.0)
MCV: 97.8 fL (ref 80.0–100.0)
Platelets: 128 10*3/uL — ABNORMAL LOW (ref 150–400)
RBC: 3.16 MIL/uL — ABNORMAL LOW (ref 4.22–5.81)
RDW: 16.1 % — ABNORMAL HIGH (ref 11.5–15.5)
WBC: 3.1 10*3/uL — ABNORMAL LOW (ref 4.0–10.5)
nRBC: 0 % (ref 0.0–0.2)

## 2024-04-12 LAB — MAGNESIUM: Magnesium: 1.8 mg/dL (ref 1.7–2.4)

## 2024-04-12 LAB — TSH: TSH: 3.903 u[IU]/mL (ref 0.350–4.500)

## 2024-04-12 LAB — HEMOGLOBIN A1C
Hgb A1c MFr Bld: 4.7 % — ABNORMAL LOW (ref 4.8–5.6)
Mean Plasma Glucose: 88.19 mg/dL

## 2024-04-12 LAB — T4, FREE: Free T4: 0.86 ng/dL (ref 0.61–1.12)

## 2024-04-12 MED ORDER — EPOETIN ALFA-EPBX 20000 UNIT/ML IJ SOLN
20000.0000 [IU] | Freq: Once | INTRAMUSCULAR | Status: AC
  Administered 2024-04-12: 20000 [IU] via SUBCUTANEOUS
  Filled 2024-04-12: qty 1

## 2024-04-12 NOTE — Progress Notes (Signed)
Michael Harmon presents today for Retacrit injection per the provider's orders.  Stable during administration without incident; injection site WNL; see MAR for injection details.  Patient tolerated procedure well and without incident.  No questions or complaints noted at this time.  

## 2024-04-12 NOTE — Patient Instructions (Signed)
 [facility] [place] [facility]  Discharge Instructions: Thank you for choosing [facility] to provide your oncology and hematology care.  If you have a lab appointment with the Cancer Center - please note that after April 8th, 2024, all labs will be drawn in the cancer center.  You do not have to check in or register with the main entrance as you have in the past but will complete your check-in in the cancer center.  Wear comfortable clothing and clothing appropriate for easy access to any Portacath or PICC line.   We strive to give you quality time with your provider. You may need to reschedule your appointment if you arrive late (15 or more minutes).  Arriving late affects you and other patients whose appointments are after yours.  Also, if you miss three or more appointments without notifying the office, you may be dismissed from the clinic at the provider's discretion.      For prescription refill requests, have your pharmacy contact our office and allow 72 hours for refills to be completed.    Today you received the following chemotherapy and/or immunotherapy agents Retacrit      To help prevent nausea and vomiting after your treatment, we encourage you to take your nausea medication as directed.  BELOW ARE SYMPTOMS THAT SHOULD BE REPORTED IMMEDIATELY: *FEVER GREATER THAN 100.4 F (38 C) OR HIGHER *CHILLS OR SWEATING *NAUSEA AND VOMITING THAT IS NOT CONTROLLED WITH YOUR NAUSEA MEDICATION *UNUSUAL SHORTNESS OF BREATH *UNUSUAL BRUISING OR BLEEDING *URINARY PROBLEMS (pain or burning when urinating, or frequent urination) *BOWEL PROBLEMS (unusual diarrhea, constipation, pain near the anus) TENDERNESS IN MOUTH AND THROAT WITH OR WITHOUT PRESENCE OF ULCERS (sore throat, sores in mouth, or a toothache) UNUSUAL RASH, SWELLING OR PAIN  UNUSUAL VAGINAL DISCHARGE OR ITCHING   Items with * indicate a potential emergency and should be followed up  as soon as possible or go to the Emergency Department if any problems should occur.  Please show the CHEMOTHERAPY ALERT CARD or IMMUNOTHERAPY ALERT CARD at check-in to the Emergency Department and triage nurse.  Should you have questions after your visit or need to cancel or reschedule your appointment, please contact [facility] [place] - A DEPT OF Eligha Bridegroom [facility] [phone]  and follow the prompts.  Office hours are 8:00 a.m. to 4:30 p.m. Monday - Friday. Please note that voicemails left after 4:00 p.m. may not be returned until the following business day.  We are closed weekends and major holidays. You have access to a nurse at all times for urgent questions. Please call the main number to the clinic [phone] and follow the prompts.  For any non-urgent questions, you may also contact your provider using MyChart. We now offer e-Visits for anyone 90 and older to request care online for non-urgent symptoms. For details visit [email or web address].   Also download the MyChart app! Go to the app store, search "MyChart", open the app, select [place], and log in with your MyChart username and password.

## 2024-04-21 ENCOUNTER — Other Ambulatory Visit (HOSPITAL_COMMUNITY)
Admission: RE | Admit: 2024-04-21 | Discharge: 2024-04-21 | Disposition: A | Discharge status: Home / Self Care | Source: Ambulatory Visit | Attending: Gastroenterology | Admitting: Gastroenterology

## 2024-04-21 DIAGNOSIS — E875 Hyperkalemia: Secondary | ICD-10-CM | POA: Diagnosis present

## 2024-04-21 LAB — POTASSIUM: Potassium: 5.4 mmol/L — ABNORMAL HIGH (ref 3.5–5.1)

## 2024-04-25 ENCOUNTER — Inpatient Hospital Stay

## 2024-04-25 ENCOUNTER — Encounter: Payer: Self-pay | Admitting: Hematology

## 2024-04-25 NOTE — Progress Notes (Signed)
 No charges

## 2024-04-26 ENCOUNTER — Inpatient Hospital Stay

## 2024-04-27 ENCOUNTER — Inpatient Hospital Stay

## 2024-04-27 ENCOUNTER — Other Ambulatory Visit (HOSPITAL_COMMUNITY)
Admission: RE | Admit: 2024-04-27 | Discharge: 2024-04-27 | Disposition: A | Discharge status: Home / Self Care | Source: Ambulatory Visit | Attending: Gastroenterology | Admitting: Gastroenterology

## 2024-04-27 VITALS — BP 138/61 | HR 73 | Temp 97.6°F | Resp 18

## 2024-04-27 DIAGNOSIS — N1832 Chronic kidney disease, stage 3b: Secondary | ICD-10-CM

## 2024-04-27 DIAGNOSIS — E875 Hyperkalemia: Secondary | ICD-10-CM | POA: Diagnosis present

## 2024-04-27 LAB — CBC WITH DIFFERENTIAL/PLATELET
Abs Immature Granulocytes: 0.01 10*3/uL (ref 0.00–0.07)
Basophils Absolute: 0 10*3/uL (ref 0.0–0.1)
Basophils Relative: 0 %
Eosinophils Absolute: 0.1 10*3/uL (ref 0.0–0.5)
Eosinophils Relative: 5 %
HCT: 30.5 % — ABNORMAL LOW (ref 39.0–52.0)
Hemoglobin: 9.5 g/dL — ABNORMAL LOW (ref 13.0–17.0)
Immature Granulocytes: 0 %
Lymphocytes Relative: 41 %
Lymphs Abs: 1.1 10*3/uL (ref 0.7–4.0)
MCH: 30.2 pg (ref 26.0–34.0)
MCHC: 31.1 g/dL (ref 30.0–36.0)
MCV: 96.8 fL (ref 80.0–100.0)
Monocytes Absolute: 0.3 10*3/uL (ref 0.1–1.0)
Monocytes Relative: 11 %
Neutro Abs: 1.1 10*3/uL — ABNORMAL LOW (ref 1.7–7.7)
Neutrophils Relative %: 43 %
Platelets: 129 10*3/uL — ABNORMAL LOW (ref 150–400)
RBC: 3.15 MIL/uL — ABNORMAL LOW (ref 4.22–5.81)
RDW: 15.3 % (ref 11.5–15.5)
WBC: 2.6 10*3/uL — ABNORMAL LOW (ref 4.0–10.5)
nRBC: 0 % (ref 0.0–0.2)

## 2024-04-27 LAB — COMPREHENSIVE METABOLIC PANEL WITH GFR
ALT: 35 U/L (ref 0–44)
AST: 31 U/L (ref 15–41)
Albumin: 3.2 g/dL — ABNORMAL LOW (ref 3.5–5.0)
Alkaline Phosphatase: 181 U/L — ABNORMAL HIGH (ref 38–126)
Anion gap: 7 (ref 5–15)
BUN: 78 mg/dL — ABNORMAL HIGH (ref 8–23)
CO2: 24 mmol/L (ref 22–32)
Calcium: 9.1 mg/dL (ref 8.9–10.3)
Chloride: 106 mmol/L (ref 98–111)
Creatinine, Ser: 1.65 mg/dL — ABNORMAL HIGH (ref 0.61–1.24)
GFR, Estimated: 46 mL/min — ABNORMAL LOW (ref >60)
Glucose, Bld: 87 mg/dL (ref 70–99)
Potassium: 5.2 mmol/L — ABNORMAL HIGH (ref 3.5–5.1)
Sodium: 137 mmol/L (ref 135–145)
Total Bilirubin: 0.5 mg/dL (ref 0.0–1.2)
Total Protein: 6.8 g/dL (ref 6.5–8.1)

## 2024-04-27 LAB — MAGNESIUM: Magnesium: 2 mg/dL (ref 1.7–2.4)

## 2024-04-27 MED ORDER — EPOETIN ALFA-EPBX 20000 UNIT/ML IJ SOLN
20000.0000 [IU] | Freq: Once | INTRAMUSCULAR | Status: AC
  Administered 2024-04-27: 20000 [IU] via SUBCUTANEOUS
  Filled 2024-04-27: qty 1

## 2024-04-27 NOTE — Progress Notes (Signed)
 Michael Harmon presents today for Retacrit  injection per the provider's orders.  HBG noted to be 9.5.  Vitals stable.  Stable during administration without incident; injection site WNL; see MAR for injection details.  Patient tolerated procedure well and without incident.  No questions or complaints noted at this time.

## 2024-04-27 NOTE — Patient Instructions (Signed)
 [facility] [place] [facility]  Discharge Instructions: Thank you for choosing [facility] to provide your oncology and hematology care.  If you have a lab appointment with the Cancer Center - please note that after April 8th, 2024, all labs will be drawn in the cancer center.  You do not have to check in or register with the main entrance as you have in the past but will complete your check-in in the cancer center.  Wear comfortable clothing and clothing appropriate for easy access to any Portacath or PICC line.   We strive to give you quality time with your provider. You may need to reschedule your appointment if you arrive late (15 or more minutes).  Arriving late affects you and other patients whose appointments are after yours.  Also, if you miss three or more appointments without notifying the office, you may be dismissed from the clinic at the provider's discretion.      For prescription refill requests, have your pharmacy contact our office and allow 72 hours for refills to be completed.    Today you received the following chemotherapy and/or immunotherapy agents Retacrit      To help prevent nausea and vomiting after your treatment, we encourage you to take your nausea medication as directed.  BELOW ARE SYMPTOMS THAT SHOULD BE REPORTED IMMEDIATELY: *FEVER GREATER THAN 100.4 F (38 C) OR HIGHER *CHILLS OR SWEATING *NAUSEA AND VOMITING THAT IS NOT CONTROLLED WITH YOUR NAUSEA MEDICATION *UNUSUAL SHORTNESS OF BREATH *UNUSUAL BRUISING OR BLEEDING *URINARY PROBLEMS (pain or burning when urinating, or frequent urination) *BOWEL PROBLEMS (unusual diarrhea, constipation, pain near the anus) TENDERNESS IN MOUTH AND THROAT WITH OR WITHOUT PRESENCE OF ULCERS (sore throat, sores in mouth, or a toothache) UNUSUAL RASH, SWELLING OR PAIN  UNUSUAL VAGINAL DISCHARGE OR ITCHING   Items with * indicate a potential emergency and should be followed up  as soon as possible or go to the Emergency Department if any problems should occur.  Please show the CHEMOTHERAPY ALERT CARD or IMMUNOTHERAPY ALERT CARD at check-in to the Emergency Department and triage nurse.  Should you have questions after your visit or need to cancel or reschedule your appointment, please contact [facility] [place] - A DEPT OF Eligha Bridegroom [facility] [phone]  and follow the prompts.  Office hours are 8:00 a.m. to 4:30 p.m. Monday - Friday. Please note that voicemails left after 4:00 p.m. may not be returned until the following business day.  We are closed weekends and major holidays. You have access to a nurse at all times for urgent questions. Please call the main number to the clinic [phone] and follow the prompts.  For any non-urgent questions, you may also contact your provider using MyChart. We now offer e-Visits for anyone 90 and older to request care online for non-urgent symptoms. For details visit [email or web address].   Also download the MyChart app! Go to the app store, search "MyChart", open the app, select [place], and log in with your MyChart username and password.

## 2024-04-29 LAB — TACROLIMUS LEVEL: Tacrolimus (FK506) - LabCorp: 4.3 ng/mL — ABNORMAL LOW (ref 5.0–20.0)

## 2024-05-10 ENCOUNTER — Inpatient Hospital Stay

## 2024-05-10 ENCOUNTER — Inpatient Hospital Stay: Attending: Physician Assistant

## 2024-05-10 VITALS — BP 140/67 | HR 72 | Temp 98.1°F | Resp 16

## 2024-05-10 DIAGNOSIS — N1832 Chronic kidney disease, stage 3b: Secondary | ICD-10-CM | POA: Diagnosis present

## 2024-05-10 DIAGNOSIS — D631 Anemia in chronic kidney disease: Secondary | ICD-10-CM | POA: Insufficient documentation

## 2024-05-10 LAB — CBC
HCT: 31.6 % — ABNORMAL LOW (ref 39.0–52.0)
Hemoglobin: 10.2 g/dL — ABNORMAL LOW (ref 13.0–17.0)
MCH: 31.3 pg (ref 26.0–34.0)
MCHC: 32.3 g/dL (ref 30.0–36.0)
MCV: 96.9 fL (ref 80.0–100.0)
Platelets: 112 K/uL — ABNORMAL LOW (ref 150–400)
RBC: 3.26 MIL/uL — ABNORMAL LOW (ref 4.22–5.81)
RDW: 14.7 % (ref 11.5–15.5)
WBC: 2.9 K/uL — ABNORMAL LOW (ref 4.0–10.5)
nRBC: 0 % (ref 0.0–0.2)

## 2024-05-10 MED ORDER — EPOETIN ALFA-EPBX 20000 UNIT/ML IJ SOLN
20000.0000 [IU] | Freq: Once | INTRAMUSCULAR | Status: AC
  Administered 2024-05-10: 20000 [IU] via SUBCUTANEOUS
  Filled 2024-05-10: qty 1

## 2024-05-10 NOTE — Progress Notes (Signed)
Patient presents today for Retacrit injection. Hemoglobin reviewed prior to administration. VSS tolerated without incident or complaint. See MAR for details. Patient stable during and after injection. Patient discharged in satisfactory condition with no s/s of distress noted.  

## 2024-05-10 NOTE — Patient Instructions (Signed)

## 2024-05-24 ENCOUNTER — Inpatient Hospital Stay

## 2024-05-24 VITALS — BP 141/64 | HR 69 | Temp 97.9°F | Resp 18

## 2024-05-24 DIAGNOSIS — D631 Anemia in chronic kidney disease: Secondary | ICD-10-CM

## 2024-05-24 DIAGNOSIS — N1832 Chronic kidney disease, stage 3b: Secondary | ICD-10-CM | POA: Diagnosis not present

## 2024-05-24 LAB — CBC
HCT: 32.6 % — ABNORMAL LOW (ref 39.0–52.0)
Hemoglobin: 10.2 g/dL — ABNORMAL LOW (ref 13.0–17.0)
MCH: 30.7 pg (ref 26.0–34.0)
MCHC: 31.3 g/dL (ref 30.0–36.0)
MCV: 98.2 fL (ref 80.0–100.0)
Platelets: 136 K/uL — ABNORMAL LOW (ref 150–400)
RBC: 3.32 MIL/uL — ABNORMAL LOW (ref 4.22–5.81)
RDW: 14.9 % (ref 11.5–15.5)
WBC: 3.2 K/uL — ABNORMAL LOW (ref 4.0–10.5)
nRBC: 0 % (ref 0.0–0.2)

## 2024-05-24 MED ORDER — EPOETIN ALFA-EPBX 20000 UNIT/ML IJ SOLN
20000.0000 [IU] | Freq: Once | INTRAMUSCULAR | Status: AC
  Administered 2024-05-24: 20000 [IU] via SUBCUTANEOUS
  Filled 2024-05-24: qty 1

## 2024-05-24 NOTE — Patient Instructions (Signed)
 [facility] [place] - A DEPT OF [facility]  Discharge Instructions: Thank you for choosing [facility] to provide your oncology and hematology care.  If you have a lab appointment with the Cancer Center - please note that after April 8th, 2024, all labs will be drawn in the cancer center.  You do not have to check in or register with the main entrance as you have in the past but will complete your check-in in the cancer center.  Wear comfortable clothing and clothing appropriate for easy access to any Portacath or PICC line.   We strive to give you quality time with your provider. You may need to reschedule your appointment if you arrive late (15 or more minutes).  Arriving late affects you and other patients whose appointments are after yours.  Also, if you miss three or more appointments without notifying the office, you may be dismissed from the clinic at the provider's discretion.      For prescription refill requests, have your pharmacy contact our office and allow 72 hours for refills to be completed.    Today you received the following Retacrit injection.  Epoetin Alfa Injection What is this medication? EPOETIN ALFA (e POE e tin AL fa) treats low levels of red blood cells (anemia) caused by kidney disease, chemotherapy, or HIV medications. It can also be used in people who are at risk for blood loss during surgery. It works by Systems analyst make more red blood cells, which reduces the need for blood transfusions. This medicine may be used for other purposes; ask your health care provider or pharmacist if you have questions. COMMON BRAND NAME(S): Epogen, Procrit, Retacrit What should I tell my care team before I take this medication? They need to know if you have any of these conditions: Blood clots Cancer Heart disease High blood pressure On dialysis Seizures Stroke An unusual or allergic reaction to epoetin alfa, albumin, benzyl alcohol,  other medications, foods, dyes, or preservatives Pregnant or trying to get pregnant Breast-feeding How should I use this medication? This medication is injected into a vein or under the skin. It is usually given by your care team in a hospital or clinic setting. It may also be given at home. If you get this medication at home, you will be taught how to prepare and give it. Use exactly as directed. Take it as directed on the prescription label at the same time every day. Keep taking it unless your care team tells you to stop. It is important that you put your used needles and syringes in a special sharps container. Do not put them in a trash can. If you do not have a sharps container, call your pharmacist or care team to get one. A special MedGuide will be given to you by the pharmacist with each prescription and refill. Be sure to read this information carefully each time. Talk to your care team about the use of this medication in children. While this medication may be used in children as young as 1 month of age for selected conditions, precautions do apply. Overdosage: If you think you have taken too much of this medicine contact a poison control center or emergency room at once. NOTE: This medicine is only for you. Do not share this medicine with others. What if I miss a dose? If you miss a dose, take it as soon as you can. If it is almost time for your next  dose, take only that dose. Do not take double or extra doses. What may interact with this medication? Darbepoetin alfa Methoxy polyethylene glycol-epoetin beta This list may not describe all possible interactions. Give your health care provider a list of all the medicines, herbs, non-prescription drugs, or dietary supplements you use. Also tell them if you smoke, drink alcohol, or use illegal drugs. Some items may interact with your medicine. What should I watch for while using this medication? Visit your care team for regular checks on your  progress. Check your blood pressure as directed. Know what your blood pressure should be and when to contact your care team. Your condition will be monitored carefully while you are receiving this medication. You may need blood work while taking this medication. What side effects may I notice from receiving this medication? Side effects that you should report to your care team as soon as possible: Allergic reactions--skin rash, itching, hives, swelling of the face, lips, tongue, or throat Blood clot--pain, swelling, or warmth in the leg, shortness of breath, chest pain Heart attack--pain or tightness in the chest, shoulders, arms, or jaw, nausea, shortness of breath, cold or clammy skin, feeling faint or lightheaded Increase in blood pressure Rash, fever, and swollen lymph nodes Redness, blistering, peeling, or loosening of the skin, including inside the mouth Seizures Stroke--sudden numbness or weakness of the face, arm, or leg, trouble speaking, confusion, trouble walking, loss of balance or coordination, dizziness, severe headache, change in vision Side effects that usually do not require medical attention (report to your care team if they continue or are bothersome): Bone, joint, or muscle pain Cough Headache Nausea Pain, redness, or irritation at injection site This list may not describe all possible side effects. Call your doctor for medical advice about side effects. You may report side effects to FDA at 1-800-FDA-1088. Where should I keep my medication? Keep out of the reach of children and pets. Store in a refrigerator. Do not freeze. Do not shake. Protect from light. Keep this medication in the original container until you are ready to take it. See product for storage information. Get rid of any unused medication after the expiration date. To get rid of medications that are no longer needed or have expired: Take the medication to a medication take-back program. Check with your  pharmacy or law enforcement to find a location. If you cannot return the medication, ask your pharmacist or care team how to get rid of the medication safely. NOTE: This sheet is a summary. It may not cover all possible information. If you have questions about this medicine, talk to your doctor, pharmacist, or health care provider.  2024 Elsevier/Gold Standard (2022-02-20 00:00:00)   To help prevent nausea and vomiting after your treatment, we encourage you to take your nausea medication as directed.  BELOW ARE SYMPTOMS THAT SHOULD BE REPORTED IMMEDIATELY: *FEVER GREATER THAN 100.4 F (38 C) OR HIGHER *CHILLS OR SWEATING *NAUSEA AND VOMITING THAT IS NOT CONTROLLED WITH YOUR NAUSEA MEDICATION *UNUSUAL SHORTNESS OF BREATH *UNUSUAL BRUISING OR BLEEDING *URINARY PROBLEMS (pain or burning when urinating, or frequent urination) *BOWEL PROBLEMS (unusual diarrhea, constipation, pain near the anus) TENDERNESS IN MOUTH AND THROAT WITH OR WITHOUT PRESENCE OF ULCERS (sore throat, sores in mouth, or a toothache) UNUSUAL RASH, SWELLING OR PAIN  UNUSUAL VAGINAL DISCHARGE OR ITCHING   Items with * indicate a potential emergency and should be followed up as soon as possible or go to the Emergency Department if any problems should occur.  Please show the CHEMOTHERAPY ALERT CARD or IMMUNOTHERAPY ALERT CARD at check-in to the Emergency Department and triage nurse.  Should you have questions after your visit or need to cancel or reschedule your appointment, please contact [facility] [place] - A DEPT OF [facility] [phone]  and follow the prompts.  Office hours are 8:00 a.m. to 4:30 p.m. Monday - Friday. Please note that voicemails left after 4:00 p.m. may not be returned until the following business day.  We are closed weekends and major holidays. You have access to a nurse at all times for urgent questions. Please call the main number to the clinic [phone] and follow the  prompts.  For any non-urgent questions, you may also contact your provider using MyChart. We now offer e-Visits for anyone 34 and older to request care online for non-urgent symptoms. For details visit [email or web address].   Also download the MyChart app! Go to the app store, search "MyChart", open the app, select [place], and log in with your MyChart username and password.

## 2024-05-24 NOTE — Progress Notes (Signed)
 Patient presents today for Retacrit  injection. Hgb 10.2. VS in parameter. No complaints voiced. Patient tolerated injection with rt arm no complaints voiced.  Site clean and dry with no bruising or swelling noted.  No complaints of pain.  Discharged with vital signs stable and no signs or symptoms of distress noted.

## 2024-05-31 ENCOUNTER — Other Ambulatory Visit (HOSPITAL_COMMUNITY)
Admission: RE | Admit: 2024-05-31 | Discharge: 2024-05-31 | Disposition: A | Discharge status: Home / Self Care | Source: Ambulatory Visit | Attending: Gastroenterology | Admitting: Gastroenterology

## 2024-05-31 DIAGNOSIS — D849 Immunodeficiency, unspecified: Secondary | ICD-10-CM | POA: Insufficient documentation

## 2024-05-31 LAB — CBC WITH DIFFERENTIAL/PLATELET
Abs Immature Granulocytes: 0.01 K/uL (ref 0.00–0.07)
Basophils Absolute: 0 K/uL (ref 0.0–0.1)
Basophils Relative: 0 %
Eosinophils Absolute: 0.2 K/uL (ref 0.0–0.5)
Eosinophils Relative: 7 %
HCT: 33.5 % — ABNORMAL LOW (ref 39.0–52.0)
Hemoglobin: 10.6 g/dL — ABNORMAL LOW (ref 13.0–17.0)
Immature Granulocytes: 0 %
Lymphocytes Relative: 43 %
Lymphs Abs: 1.2 K/uL (ref 0.7–4.0)
MCH: 31.3 pg (ref 26.0–34.0)
MCHC: 31.6 g/dL (ref 30.0–36.0)
MCV: 98.8 fL (ref 80.0–100.0)
Monocytes Absolute: 0.4 K/uL (ref 0.1–1.0)
Monocytes Relative: 13 %
Neutro Abs: 1.1 K/uL — ABNORMAL LOW (ref 1.7–7.7)
Neutrophils Relative %: 37 %
Platelets: 119 K/uL — ABNORMAL LOW (ref 150–400)
RBC: 3.39 MIL/uL — ABNORMAL LOW (ref 4.22–5.81)
RDW: 15.5 % (ref 11.5–15.5)
WBC: 2.9 K/uL — ABNORMAL LOW (ref 4.0–10.5)
nRBC: 0 % (ref 0.0–0.2)

## 2024-05-31 LAB — COMPREHENSIVE METABOLIC PANEL WITH GFR
ALT: 50 U/L — ABNORMAL HIGH (ref 0–44)
AST: 42 U/L — ABNORMAL HIGH (ref 15–41)
Albumin: 3.2 g/dL — ABNORMAL LOW (ref 3.5–5.0)
Alkaline Phosphatase: 170 U/L — ABNORMAL HIGH (ref 38–126)
Anion gap: 10 (ref 5–15)
BUN: 71 mg/dL — ABNORMAL HIGH (ref 8–23)
CO2: 22 mmol/L (ref 22–32)
Calcium: 9 mg/dL (ref 8.9–10.3)
Chloride: 105 mmol/L (ref 98–111)
Creatinine, Ser: 1.64 mg/dL — ABNORMAL HIGH (ref 0.61–1.24)
GFR, Estimated: 46 mL/min — ABNORMAL LOW (ref >60)
Glucose, Bld: 139 mg/dL — ABNORMAL HIGH (ref 70–99)
Potassium: 5.4 mmol/L — ABNORMAL HIGH (ref 3.5–5.1)
Sodium: 137 mmol/L (ref 135–145)
Total Bilirubin: 0.8 mg/dL (ref 0.0–1.2)
Total Protein: 6.9 g/dL (ref 6.5–8.1)

## 2024-05-31 LAB — MAGNESIUM: Magnesium: 2 mg/dL (ref 1.7–2.4)

## 2024-06-02 LAB — TACROLIMUS LEVEL: Tacrolimus (FK506) - LabCorp: 5.8 ng/mL (ref 5.0–20.0)

## 2024-06-07 ENCOUNTER — Inpatient Hospital Stay: Attending: Physician Assistant

## 2024-06-07 ENCOUNTER — Inpatient Hospital Stay

## 2024-06-07 VITALS — BP 134/64 | HR 64 | Temp 96.1°F | Resp 20

## 2024-06-07 DIAGNOSIS — D631 Anemia in chronic kidney disease: Secondary | ICD-10-CM | POA: Diagnosis not present

## 2024-06-07 DIAGNOSIS — N1832 Chronic kidney disease, stage 3b: Secondary | ICD-10-CM

## 2024-06-07 DIAGNOSIS — Z8639 Personal history of other endocrine, nutritional and metabolic disease: Secondary | ICD-10-CM | POA: Insufficient documentation

## 2024-06-07 LAB — CBC
HCT: 32.1 % — ABNORMAL LOW (ref 39.0–52.0)
Hemoglobin: 10.3 g/dL — ABNORMAL LOW (ref 13.0–17.0)
MCH: 31.9 pg (ref 26.0–34.0)
MCHC: 32.1 g/dL (ref 30.0–36.0)
MCV: 99.4 fL (ref 80.0–100.0)
Platelets: 136 K/uL — ABNORMAL LOW (ref 150–400)
RBC: 3.23 MIL/uL — ABNORMAL LOW (ref 4.22–5.81)
RDW: 15 % (ref 11.5–15.5)
WBC: 3 K/uL — ABNORMAL LOW (ref 4.0–10.5)
nRBC: 0 % (ref 0.0–0.2)

## 2024-06-07 MED ORDER — EPOETIN ALFA-EPBX 20000 UNIT/ML IJ SOLN
20000.0000 [IU] | Freq: Once | INTRAMUSCULAR | Status: AC
  Administered 2024-06-07: 20000 [IU] via SUBCUTANEOUS
  Filled 2024-06-07: qty 1

## 2024-06-07 NOTE — Progress Notes (Signed)
 Patient tolerated injection with no complaints voiced.  Site clean and dry with no bruising or swelling noted at site.  See MAR for details.  Band aid applied.  Patient stable during and after injection.  Vss with discharge and left in satisfactory condition with no s/s of distress noted.

## 2024-06-07 NOTE — Patient Instructions (Signed)
 [facility] [place] - [facility]  Discharge Instructions: Thank you for choosing [facility] to provide your oncology and hematology care.  If you have a lab appointment with the Cancer Center - please note that after April 8th, 2024, all labs will be drawn in the cancer center.  You do not have to check in or register with the main entrance as you have in the past but will complete your check-in in the cancer center.  Wear comfortable clothing and clothing appropriate for easy access to any Portacath or PICC line.   We strive to give you quality time with your provider. You may need to reschedule your appointment if you arrive late (15 or more minutes).  Arriving late affects you and other patients whose appointments are after yours.  Also, if you miss three or more appointments without notifying the office, you may be dismissed from the clinic at the provider's discretion.      For prescription refill requests, have your pharmacy contact our office and allow 72 hours for refills to be completed.    Today you received the following chemotherapy and/or immunotherapy agents retacrit      To help prevent nausea and vomiting after your treatment, we encourage you to take your nausea medication as directed.  BELOW ARE SYMPTOMS THAT SHOULD BE REPORTED IMMEDIATELY: *FEVER GREATER THAN 100.4 F (38 C) OR HIGHER *CHILLS OR SWEATING *NAUSEA AND VOMITING THAT IS NOT CONTROLLED WITH YOUR NAUSEA MEDICATION *UNUSUAL SHORTNESS OF BREATH *UNUSUAL BRUISING OR BLEEDING *URINARY PROBLEMS (pain or burning when urinating, or frequent urination) *BOWEL PROBLEMS (unusual diarrhea, constipation, pain near the anus) TENDERNESS IN MOUTH AND THROAT WITH OR WITHOUT PRESENCE OF ULCERS (sore throat, sores in mouth, or a toothache) UNUSUAL RASH, SWELLING OR PAIN  UNUSUAL VAGINAL DISCHARGE OR ITCHING   Items with * indicate a potential emergency and should be followed up  as soon as possible or go to the Emergency Department if any problems should occur.  Please show the CHEMOTHERAPY ALERT CARD or IMMUNOTHERAPY ALERT CARD at check-in to the Emergency Department and triage nurse.  Should you have questions after your visit or need to cancel or reschedule your appointment, please contact [facility] CTR  - A DEPT OF Eligha Bridegroom [facility] [phone]  and follow the prompts.  Office hours are 8:00 a.m. to 4:30 p.m. Monday - Friday. Please note that voicemails left after 4:00 p.m. may not be returned until the following business day.  We are closed weekends and major holidays. You have access to a nurse at all times for urgent questions. Please call the main number to the clinic [phone] and follow the prompts.  For any non-urgent questions, you may also contact your provider using MyChart. We now offer e-Visits for anyone 81 and older to request care online for non-urgent symptoms. For details visit [email or web address].   Also download the MyChart app! Go to the app store, search "MyChart", open the app, select [place], and log in with your MyChart username and password.

## 2024-06-16 ENCOUNTER — Other Ambulatory Visit (HOSPITAL_COMMUNITY)
Admission: RE | Admit: 2024-06-16 | Discharge: 2024-06-16 | Disposition: A | Discharge status: Home / Self Care | Source: Ambulatory Visit | Attending: Family Medicine | Admitting: Family Medicine

## 2024-06-16 DIAGNOSIS — D849 Immunodeficiency, unspecified: Secondary | ICD-10-CM | POA: Diagnosis present

## 2024-06-16 DIAGNOSIS — Z944 Liver transplant status: Secondary | ICD-10-CM | POA: Insufficient documentation

## 2024-06-16 LAB — CBC WITH DIFFERENTIAL/PLATELET
Abs Immature Granulocytes: 0.01 K/uL (ref 0.00–0.07)
Basophils Absolute: 0 K/uL (ref 0.0–0.1)
Basophils Relative: 0 %
Eosinophils Absolute: 0.2 K/uL (ref 0.0–0.5)
Eosinophils Relative: 5 %
HCT: 31.4 % — ABNORMAL LOW (ref 39.0–52.0)
Hemoglobin: 10 g/dL — ABNORMAL LOW (ref 13.0–17.0)
Immature Granulocytes: 0 %
Lymphocytes Relative: 37 %
Lymphs Abs: 1.1 K/uL (ref 0.7–4.0)
MCH: 31.2 pg (ref 26.0–34.0)
MCHC: 31.8 g/dL (ref 30.0–36.0)
MCV: 97.8 fL (ref 80.0–100.0)
Monocytes Absolute: 0.4 K/uL (ref 0.1–1.0)
Monocytes Relative: 13 %
Neutro Abs: 1.4 K/uL — ABNORMAL LOW (ref 1.7–7.7)
Neutrophils Relative %: 45 %
Platelets: 130 K/uL — ABNORMAL LOW (ref 150–400)
RBC: 3.21 MIL/uL — ABNORMAL LOW (ref 4.22–5.81)
RDW: 15.1 % (ref 11.5–15.5)
WBC: 3.1 K/uL — ABNORMAL LOW (ref 4.0–10.5)
nRBC: 0 % (ref 0.0–0.2)

## 2024-06-16 LAB — COMPREHENSIVE METABOLIC PANEL WITH GFR
ALT: 31 U/L (ref 0–44)
AST: 29 U/L (ref 15–41)
Albumin: 3.2 g/dL — ABNORMAL LOW (ref 3.5–5.0)
Alkaline Phosphatase: 139 U/L — ABNORMAL HIGH (ref 38–126)
Anion gap: 7 (ref 5–15)
BUN: 71 mg/dL — ABNORMAL HIGH (ref 8–23)
CO2: 22 mmol/L (ref 22–32)
Calcium: 8.9 mg/dL (ref 8.9–10.3)
Chloride: 107 mmol/L (ref 98–111)
Creatinine, Ser: 1.78 mg/dL — ABNORMAL HIGH (ref 0.61–1.24)
GFR, Estimated: 42 mL/min — ABNORMAL LOW (ref >60)
Glucose, Bld: 128 mg/dL — ABNORMAL HIGH (ref 70–99)
Potassium: 5.4 mmol/L — ABNORMAL HIGH (ref 3.5–5.1)
Sodium: 136 mmol/L (ref 135–145)
Total Bilirubin: 0.7 mg/dL (ref 0.0–1.2)
Total Protein: 6.5 g/dL (ref 6.5–8.1)

## 2024-06-16 LAB — MAGNESIUM: Magnesium: 2 mg/dL (ref 1.7–2.4)

## 2024-06-17 LAB — TACROLIMUS LEVEL: Tacrolimus (FK506) - LabCorp: 6.5 ng/mL (ref 5.0–20.0)

## 2024-06-19 ENCOUNTER — Other Ambulatory Visit: Payer: Self-pay

## 2024-06-20 ENCOUNTER — Inpatient Hospital Stay

## 2024-06-20 VITALS — BP 151/70 | HR 67 | Temp 96.0°F

## 2024-06-20 DIAGNOSIS — D631 Anemia in chronic kidney disease: Secondary | ICD-10-CM

## 2024-06-20 DIAGNOSIS — E538 Deficiency of other specified B group vitamins: Secondary | ICD-10-CM

## 2024-06-20 DIAGNOSIS — N1832 Chronic kidney disease, stage 3b: Secondary | ICD-10-CM | POA: Diagnosis not present

## 2024-06-20 LAB — CBC WITH DIFFERENTIAL/PLATELET
Abs Immature Granulocytes: 0.01 K/uL (ref 0.00–0.07)
Basophils Absolute: 0 K/uL (ref 0.0–0.1)
Basophils Relative: 0 %
Eosinophils Absolute: 0.1 K/uL (ref 0.0–0.5)
Eosinophils Relative: 4 %
HCT: 30.8 % — ABNORMAL LOW (ref 39.0–52.0)
Hemoglobin: 9.8 g/dL — ABNORMAL LOW (ref 13.0–17.0)
Immature Granulocytes: 0 %
Lymphocytes Relative: 44 %
Lymphs Abs: 1.2 K/uL (ref 0.7–4.0)
MCH: 31.1 pg (ref 26.0–34.0)
MCHC: 31.8 g/dL (ref 30.0–36.0)
MCV: 97.8 fL (ref 80.0–100.0)
Monocytes Absolute: 0.3 K/uL (ref 0.1–1.0)
Monocytes Relative: 10 %
Neutro Abs: 1.2 K/uL — ABNORMAL LOW (ref 1.7–7.7)
Neutrophils Relative %: 42 %
Platelets: 126 K/uL — ABNORMAL LOW (ref 150–400)
RBC: 3.15 MIL/uL — ABNORMAL LOW (ref 4.22–5.81)
RDW: 14.7 % (ref 11.5–15.5)
WBC: 2.8 K/uL — ABNORMAL LOW (ref 4.0–10.5)
nRBC: 0 % (ref 0.0–0.2)

## 2024-06-20 LAB — COMPREHENSIVE METABOLIC PANEL WITH GFR
ALT: 32 U/L (ref 0–44)
AST: 30 U/L (ref 15–41)
Albumin: 3.3 g/dL — ABNORMAL LOW (ref 3.5–5.0)
Alkaline Phosphatase: 129 U/L — ABNORMAL HIGH (ref 38–126)
Anion gap: 10 (ref 5–15)
BUN: 79 mg/dL — ABNORMAL HIGH (ref 8–23)
CO2: 23 mmol/L (ref 22–32)
Calcium: 8.8 mg/dL — ABNORMAL LOW (ref 8.9–10.3)
Chloride: 104 mmol/L (ref 98–111)
Creatinine, Ser: 1.84 mg/dL — ABNORMAL HIGH (ref 0.61–1.24)
GFR, Estimated: 40 mL/min — ABNORMAL LOW (ref >60)
Glucose, Bld: 174 mg/dL — ABNORMAL HIGH (ref 70–99)
Potassium: 5.7 mmol/L — ABNORMAL HIGH (ref 3.5–5.1)
Sodium: 137 mmol/L (ref 135–145)
Total Bilirubin: 0.7 mg/dL (ref 0.0–1.2)
Total Protein: 6.8 g/dL (ref 6.5–8.1)

## 2024-06-20 LAB — IRON AND TIBC
Iron: 74 ug/dL (ref 45–182)
Saturation Ratios: 24 % (ref 17.9–39.5)
TIBC: 314 ug/dL (ref 250–450)
UIBC: 240 ug/dL

## 2024-06-20 LAB — VITAMIN B12: Vitamin B-12: 1007 pg/mL — ABNORMAL HIGH (ref 180–914)

## 2024-06-20 LAB — FERRITIN: Ferritin: 275 ng/mL (ref 24–336)

## 2024-06-20 MED ORDER — EPOETIN ALFA-EPBX 20000 UNIT/ML IJ SOLN
20000.0000 [IU] | Freq: Once | INTRAMUSCULAR | Status: AC
  Administered 2024-06-20: 20000 [IU] via SUBCUTANEOUS
  Filled 2024-06-20: qty 1

## 2024-06-20 NOTE — Progress Notes (Signed)
 Patient's Hgb 9.8 and blood pressure stable. Patient tolerated Retacrit  injection with no complaints voiced.  Site clean and dry with no bruising or swelling noted at site.  See MAR for details.  Band aid applied.  Patient stable during and after injection.  Vss with discharge and left in satisfactory condition with no s/s of distress noted. All follow ups as scheduled.   Michael Harmon

## 2024-06-20 NOTE — Patient Instructions (Signed)

## 2024-06-21 ENCOUNTER — Inpatient Hospital Stay

## 2024-06-28 LAB — METHYLMALONIC ACID, SERUM: Methylmalonic Acid, Quantitative: 304 nmol/L (ref 0–378)

## 2024-07-04 NOTE — Progress Notes (Unsigned)
 [facility] 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 Cordell 63 y.o. 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  He was last seen by Pleasant Barefoot PA-C on 03/15/2024.  He has not had any more hospitalizations since last visit, most recently was hospitalized at [facility] in April/May 2025. He continues to follow with various subspecialists at [facility], although he reports that he is no longer being considered as a candidate for renal transplant.  At today's visit, he reports feeling poorly, secondary to multiple health issues.  *** ***He continues to bruise easily, but denies any obvious bleeding such as epistaxis, hematochezia, melena, or hematuria.  ***He is on Eliquis twice daily for DVT/PE. ***He denies any masses or lymphadenopathy. ***He has not had any recent infections.    ***He denies any B symptoms.   He has little to no energy and low appetite. ***He remains on tube feedings at this time.  ***His weight is stable.  ***He is tolerating Retacrit  fairly well.  Blood pressure today is within target range.    ASSESSMENT & PLAN:  1.  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 - CT scan (11/14/2021 at [facility]) showed enlarged spleen measuring 17 cm - Work-up negative for other causes of thrombocytopenia - normal B12, methylmalonic acid, folate, copper ; negative rheumatoid factor/ANA; no abnormal platelets noted on pathology smear review - Received platelet transfusion x1 on 08/18/2021 prior to prostate biopsy.  Blood transfusion x1 on 01/15/2022 prior to prostate SpaceOAR placement is - Bone marrow biopsy (02/10/2022) shows hypercellular marrow for age with trilineage lineage hematopoiesis.  No increase in blasts.  No evidence of metastatic carcinoma. - Liver transplant at [facility] on 01/16/2023, on Myfortic  which may have been contributing to his neutropenia (dose reduced on 05/31/2023 due to neutropenia) - CBC today (***): Platelets ***.  WBC ***KRAIG ***  - DIFFERENTIAL DIAGNOSIS: Thrombocytopenia and leukopenia likely secondary to cirrhosis (s/p liver transplant 01/16/2023), medication (tacrolimus ) *** and splenic sequestration.  Unclear if patient has any underlying ITP in addition to this. Discussed with patient that spleen size may not fully returned to normal after liver transplantation, and therefore platelet count may or may not fully return to normal - PLAN:  Platelets are improved following liver transplantation.  No indication for treatment at this time.  We will continue to monitor with periodic CBCs. - Downtrending WBCs noted.  ***ATTENTION TO WBC at follow-up.   2.  Normocytic anemia, secondary to CKD + iron  deficiency  - Bone marrow biopsy (02/10/2022) shows hypercellular marrow for age with trilineage lineage hematopoiesis.  No increase in blasts.  No evidence of metastatic carcinoma. - EGDs at [facility] in April 2023 in October 2023  showed esophageal varices (s/p banding), old blood in the stomach, and portal hypertensive gastropathy  - Other work-up (08/18/2021) showed normal reticulocytes; normal B12,  methylmalonic acid, folate, and copper .  Immunofixation and SPEP negative.  (Homocystine elevation due to cirrhosis, no improvement after folic acid  supplementation) - Patient reports that he has been diagnosed with celiac disease by Dr. Shaaron - He received IV Venofer  x1000 mg, last dose given 09/03/2021 - Retacrit  10,000 units every 2 weeks started 10/22/2022 (interrupted due to liver transplant 01/16/2023).  Retacrit  restarted on 04/14/2023.  Current dose is 20,000 units every 2 weeks. - Intermittent blood transfusions, most recently PRBC x 3 during hospitalization at [facility] in April/May 2025.  No bloody bowel movements noted during hospital stay, but patient had right buttock hematoma from fall at SNF, with CTAP with contrast showing right gluteal hematoma with no active extravasation. - Per discharge summary from [facility] (03/06/2024), recent EGD, capsule endoscopy, and colonoscopy were without active signs of bleeding. - G-tube placed on 02/03/2024, remains on tube feedings for malnutrition and failure to thrive  - Most recent labs (06/20/2024):  Ferritin 275, iron  saturation 24% Creatinine 1.8/GFR 40. Vitamin B12 is 1007.  MMA normalized. - CBC today (***): *** - Started on Eliquis 02/07/2024 due to DVT/PE.  *** - Denies any rectal bleeding or melena.  *** - Reports severe fatigue related to other comorbidities.  *** No pica, chest pain, or difficulty breathing. - DIFFERENTIAL DIAGNOSIS: Anemia secondary to CKD, further complicated by malabsorption (celiac disease), intermittent GI bleeding - PLAN: Continue Retacrit  20,000 units every 2 weeks - Continue vitamin B12 1000 mcg  - can DECREASE to every other day *** - Labs (CBC/D, CMP, ferritin, iron /TIBC, B12, MMA) in 3 months with OFFICE visit 1 week after labs  3.  Pulmonary embolism and DVT - Diagnosed at [facility], presented with hypoxia on 01/28/2024 - CT (02/05/2024) showed right lower lobe pulmonary embolus, extending from right pulmonary artery into  segmental and subsegmental branches - Bilateral lower extremity US  (02/06/2024): Nonocclusive DVT within right popliteal vein - Started on Eliquis as of 02/07/2024*** - Likely provoked DVT/PE due to multiple hospitalizations in the immediate time period preceding DVT/PE - *** Current mobility and ongoing risk factors?  *** - PLAN: Continue Eliquis 5 mg twice daily for the foreseeable future. *** Recommend at least 6 months of treatment, possibly longer depending on persistent risk factors.  Would recommend checking hypercoagulable panel prior to any discontinuation of anticoagulant. ***Order coagulopathy panel including repeat D-dimer prior to next visit ***  4.  Cirrhosis with esophageal varices - S/P LIVER TRANSPLANT 01/16/2023 - Diagnosed in 2017 - Patient had EGD/colonoscopy on 07/31/2021 which showed portal colopathy as well as gastric erosions and grade 3 varices, which were not banded due to low platelet count - EGD (12/26/2021 via [facility]): Grade 2 esophageal varices, banded x3; old blood noted in stomach; mild portal hypertensive gastropathy - Patient follows with hepatology at [facility] - Liver transplant at [facility] on 01/16/2023 NOTE: Patient's post-transplant nurse coordinator at [facility] is [facility]  [phone])    5.  Stage T1c adenocarcinoma of the prostate, Gleason 3+4   - Diagnosed in October 2022 with prostate biopsy after abnormal PSA (8.2) - Following with urologist (Dr. Nieves / Dr. Sherrilee) and has also been seen by radiation oncology [facility]-C / Dr. Donnice Barge) - He had SpaceOAR placement on 01/15/2022 - Patient completed XRT in [place] on 03/13/2022. - He is receiving Lupron  shots and following with urology and  radiation oncology  PLAN SUMMARY: *** >> CBC + Retacrit  every 2 weeks (**attention to WBC trend**) >> Labs in 3 months = CBC/D, CMP, ferritin, iron /TIBC, B12, MMA + *** coagulopathy panel *** >> OFFICE visit in 3 months (2 weeks after full lab panel) +  CBC/Injection     REVIEW OF SYSTEMS: ***  Review of Systems  Constitutional:  Positive for appetite change and fatigue. Negative for chills, diaphoresis, fever and unexpected weight change.  HENT:   Negative for lump/mass and nosebleeds.   Eyes:  Negative for eye problems.  Respiratory:  Negative for cough, hemoptysis and shortness of breath.   Cardiovascular:  Negative for chest pain, leg swelling and palpitations.  Gastrointestinal:  Positive for constipation, diarrhea and nausea. Negative for abdominal pain, blood in stool and vomiting.  Genitourinary:  Negative for hematuria.   Musculoskeletal:  Positive for arthralgias.  Skin: Negative.   Neurological:  Positive for dizziness and numbness. Negative for headaches and light-headedness.  Hematological:  Does not bruise/bleed easily.  Psychiatric/Behavioral:  Positive for decreased concentration (Forgetful) and sleep disturbance.     PHYSICAL EXAM:***  ECOG PERFORMANCE STATUS: 2 - Symptomatic, <50% confined to bed  There were no vitals filed for this visit.  There were no vitals filed for this visit.  Physical Exam Constitutional:      Appearance: Normal appearance. He is obese.     Comments: Appears frail and chronically ill  Cardiovascular:     Rate and Rhythm: Normal rate.     Heart sounds: Normal heart sounds.  Pulmonary:     Breath sounds: Normal breath sounds.  Neurological:     General: No focal deficit present.     Mental Status: Mental status is at baseline.  Psychiatric:        Behavior: Behavior normal. Behavior is cooperative.     PAST MEDICAL/SURGICAL HISTORY:  Past Medical History:  Diagnosis Date   Allergy    nose and sinus problems   Anemia    Anemia in chronic kidney disease (CKD) 11/23/2022   Asthma    Celiac disease    Cirrhosis, cryptogenic (HCC)    completed Hep A and B vaccines   DM (diabetes mellitus) (HCC)    Encounter for general adult medical examination with abnormal findings  09/08/2022   GERD (gastroesophageal reflux disease)    Hyperlipidemia    diet controlled now with 100 pound weight loss   Hypertension    Hypothyroidism 03/17/2024   Iron  deficiency anemia due to chronic blood loss 08/13/2021   Malignant neoplasm of prostate (HCC) 10/03/2021   Murmur, heart 03/02/2022   Narrow angle glaucoma suspect of both eyes    Renal cyst 09/03/2017   right   Situational depression 07/06/2017   Splenomegaly    Thrombocytopenia (HCC)    hematology, ?related to chol med (tricor) and glimeripide, just being monitored, above 100,000   Past Surgical History:  Procedure Laterality Date   BIOPSY  07/09/2016   Procedure: BIOPSY;  Surgeon: Lamar CHRISTELLA Hollingshead, MD;  [facility];  Service: Endoscopy;;  duodenal, gastric, esophaageal   BIOPSY  07/31/2021   Procedure: BIOPSY;  Surgeon: Hollingshead Lamar CHRISTELLA, MD;  Location: [facility];  Service: Endoscopy;;   CLOSED MANIPULATION SHOULDER     COLONOSCOPY  11/2015   Dr. Tobie: cecal bx neg for microscopic colitis. ascending colon polyp (adenomatous)   COLONOSCOPY WITH PROPOFOL  N/A 07/31/2021   Procedure: COLONOSCOPY WITH PROPOFOL ;  Surgeon: Hollingshead Lamar CHRISTELLA, MD;  [facility]: [facility];  Service: Endoscopy;  Laterality: N/A;  7:30am   ESOPHAGEAL BANDING N/A 08/04/2017   Procedure: ESOPHAGEAL BANDING;  Surgeon: Shaaron Lamar HERO, MD;  [facility]: [facility];  Service: Endoscopy;  Laterality: N/A;  esophageal varices banding   ESOPHAGEAL BANDING N/A 10/13/2017   Procedure: ESOPHAGEAL BANDING;  Surgeon: Shaaron Lamar HERO, MD;  [facility];  Service: Endoscopy;  Laterality: N/A;   ESOPHAGEAL BANDING N/A 04/09/2020   Procedure: ESOPHAGEAL BANDING;  Surgeon: Shaaron Lamar HERO, MD;  [facility];  Service: Endoscopy;  Laterality: N/A;   ESOPHAGOGASTRODUODENOSCOPY N/A 07/09/2016   Procedure: ESOPHAGOGASTRODUODENOSCOPY (EGD);  Surgeon: Lamar HERO Shaaron, MD;  [facility];  Service: Endoscopy;  Laterality: N/A;  730    ESOPHAGOGASTRODUODENOSCOPY N/A 08/04/2017   Procedure: ESOPHAGOGASTRODUODENOSCOPY (EGD);  Surgeon: Shaaron Lamar HERO, MD;  [facility];  Service: Endoscopy;  Laterality: N/A;  7:30am   ESOPHAGOGASTRODUODENOSCOPY N/A 10/13/2017   Dr. Shaaron: grade 2 esophageal varices status post banding, portal hypertensive gastropathy   ESOPHAGOGASTRODUODENOSCOPY  11/23/2019   [name]: Esophageal varices, 4 columns of grade 2-3 status post banding.  Varices more prominent than seen in December 2018.  Portal gastropathy.  Normal-appearing small bowel.   ESOPHAGOGASTRODUODENOSCOPY (EGD) WITH PROPOFOL  N/A 04/09/2020   Dr. Shaaron: 4 columns of grade 2/grade 3 esophageal varices somewhat recalcitrant.  Status post esophageal band ligation today.  Portal gastropathy.  Normal-appearing small bowel.   ESOPHAGOGASTRODUODENOSCOPY (EGD) WITH PROPOFOL  N/A 07/31/2021   Procedure: ESOPHAGOGASTRODUODENOSCOPY (EGD) WITH PROPOFOL ;  Surgeon: Shaaron Lamar HERO, MD;  [facility];  Service: Endoscopy;  Laterality: N/A;   GOLD SEED IMPLANT N/A 01/15/2022   Procedure: GOLD SEED IMPLANT;  Surgeon: Sherrilee Belvie CROME, MD;  Location: [facility];  Service: Urology;  Laterality: N/A;   LIVER TRANSPLANT  01/16/2023   SPACE OAR INSTILLATION N/A 01/15/2022   Procedure: SPACE OAR INSTILLATION;  Surgeon: Sherrilee Belvie CROME, MD;  Location: [facility];  Service: Urology;  Laterality: N/A;   US  PARACENTESIS  06/10/2022    SOCIAL HISTORY:  Social History   Socioeconomic History   Marital status: Married    Spouse name: Melissa   Number of children: 0   Years of education: 14   Highest education level: Associate degree: occupational, Scientist, product/process development, or vocational program  Occupational History   Occupation: Lexicographer, 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   Has no children.   Eats all food groups.   Works for [employer]       Social Drivers of Corporate investment banker Strain: Low Risk  (03/02/2024)   Received from [facility]   Overall Financial Resource Strain (CARDIA)    Difficulty of Paying Living Expenses: Not hard at all  Food Insecurity: No Food Insecurity (03/08/2024)   Hunger Vital Sign    Worried About Running Out of Food in the Last Year: Never true    Ran Out of Food in the Last Year: Never true  Transportation Needs: No Transportation Needs (03/08/2024)   PRAPARE - Transportation    Lack of  Transportation (Medical): No    Lack of Transportation (Non-Medical): No  Physical Activity: Insufficiently Active (12/07/2023)   Exercise Vital Sign    Days of Exercise per Week: 2 days    Minutes of Exercise per Session: 30 min  Stress: Stress Concern Present (12/07/2023)   [employer] of Occupational Health - Occupational Stress Questionnaire    Feeling of Stress : To some extent  Social Connections: Socially Integrated (12/07/2023)   Social Connection and Isolation Panel    Frequency of Communication with Friends and Family: More than three times a week    Frequency of Social Gatherings with Friends and Family: Twice a week    Attends Religious Services: More than 4 times per year    Active Member of [employer] or Organizations: Yes    Attends Engineer, structural: More than 4 times per year    Marital Status: Married  Catering manager Violence: Patient Unable To Answer (03/08/2024)   Humiliation, Afraid, Rape, and Kick questionnaire    Fear of Current or Ex-Partner: Patient unable to answer    Emotionally Abused: Patient unable to answer    Physically Abused: Patient unable to answer    Sexually Abused: Patient unable to answer     FAMILY HISTORY:  Family History  Problem Relation Age of Onset   Other Mother        chronic diarrhea   COPD Mother    Heart disease Mother        died at 78   Arthritis Mother    Cancer Mother    Depression Mother    Diabetes Mother    Hyperlipidemia Mother    Hypertension Mother    Stroke Mother    Arthritis Father    Asthma Father    Birth defects Father    Heart disease Father        aortic valve replaced   Heart disease Maternal Grandmother    Stroke Maternal Grandmother    Heart disease Maternal Grandfather    Stroke Maternal Grandfather    Diabetes Paternal Grandmother    Stroke Paternal Grandfather    Heart disease Paternal Grandfather    Liver disease Neg Hx    Colon cancer Neg Hx     CURRENT MEDICATIONS:  Outpatient Encounter Medications as of 07/05/2024  Medication Sig Note   acetaminophen  (TYLENOL ) 325 MG tablet Take 650 mg by mouth every 8 (eight) hours as needed.    albuterol (VENTOLIN HFA) 108 (90 Base) MCG/ACT inhaler Inhale 2 puffs into the lungs every 6 (six) hours as needed for wheezing or shortness of breath.    ammonium lactate (LAC-HYDRIN) 12 % lotion Apply 1 Application topically daily. 01/19/2024: Reports not picked up yet from pharmacy   apixaban (ELIQUIS) 5 MG TABS tablet Take 5 mg by mouth 2 (two) times daily. 03/08/2024: Reports that he does not have this medication.  [facility] transplant team notified on 03/08/2024   aspirin  EC 81 MG tablet Take by mouth. 03/08/2024: On hold    Calcium  Citrate-Vitamin D  (CALCIUM  CITRATE + D3 PO) Take 2 tablets by mouth 2 (two) times daily.    Continuous Glucose Sensor (FREESTYLE LIBRE 2 SENSOR) MISC See admin instructions.    Copper  Gluconate 2 MG TABS Take 2 mg by mouth daily. 01/19/2024: Has not picked up yet   cyanocobalamin  (VITAMIN B12) 1000 MCG tablet Take 1 tablet (1,000 mcg total) by mouth daily.    diphenoxylate-atropine (LOMOTIL) 2.5-0.025 MG tablet Take 1 tablet by mouth 3 (three)  times daily. 01/19/2024:  Has not picked up yet   furosemide  (LASIX ) 40 MG tablet Take 40 mg by mouth 2 (two) times daily.    levothyroxine (SYNTHROID) 50 MCG tablet Take 50 mcg by mouth daily before breakfast.    Lido-Capsaicin-Men-Methyl Sal (MEDI-PATCH-LIDOCAINE  EX) Apply 1 patch topically as directed.    LOKELMA  5 g packet daily.    loperamide (IMODIUM) 2 MG capsule Take 2 mg by mouth.    MAGNESIUM-OXIDE PO Take 266 mg by mouth.    MELATONIN ER PO Take 9 mg by mouth at bedtime. 03/08/2024: States that he does not have RX. Transplant team aware   Melatonin-Pyridoxine (MELATIN PO) Take 9 mg by mouth at bedtime. 03/08/2024: Reports that he does not have this RX.    Multiple Vitamin (MULTIVITAMIN WITH MINERALS) TABS tablet Take 1 tablet by mouth daily. 03/08/2024: Reports no RX.  [facility] transplant team informed.    NON FORMULARY Tube feedings from 6pm-8am  via tube feeding pump.    ondansetron  (ZOFRAN -ODT) 4 MG disintegrating tablet Take 4 mg by mouth every 8 (eight) hours as needed.    Pancrelipase, Lip-Prot-Amyl, 25000-79000 units CPEP Take 2 capsules by mouth.    pantoprazole  (PROTONIX ) 40 MG tablet Take 40 mg by mouth 2 (two) times daily.    pyridoxine (B-6) 100 MG tablet Take 100 mg by mouth daily.    tacrolimus  (PROGRAF ) 1 MG capsule Take 2 mg by mouth 2 (two) times daily.    tamsulosin  (FLOMAX ) 0.4 MG CAPS capsule Take 0.4 mg by mouth daily.    UNABLE TO FIND Med Name: Magnesium oxide-magnesium amino acid chelate 266 mg BID    zinc sulfate, 50mg  elemental zinc, 220 (50 Zn) MG capsule Take 220 mg by mouth.    No facility-administered encounter medications on file as of 07/05/2024.    ALLERGIES:  Allergies  Allergen Reactions   Penicillins Other (See Comments)    As a baby, unknown reaction Did it involve swelling of the face/tongue/throat, SOB, or low BP? Unknown Did it involve sudden or severe rash/hives, skin peeling, or any reaction on the inside of your mouth or nose? Unknown Did you need to seek medical  attention at a hospital or doctor's office? Unknown When did it last happen?      infant allergy If all above answers are "NO", may proceed with cephalosporin use. .    Gluten Meal Diarrhea   Biaxin [Clarithromycin] Tinitus    Ears ringing, loss sense of smell    LABORATORY DATA:  I have reviewed the labs as listed.  CBC    Component Value Date/Time   WBC 2.8 (L) 06/20/2024 1143   RBC 3.15 (L) 06/20/2024 1143   HGB 9.8 (L) 06/20/2024 1143   HGB 11.1 (L) 09/02/2021 1655   HCT 30.8 (L) 06/20/2024 1143   HCT 32.9 (L) 09/02/2021 1655   PLT 126 (L) 06/20/2024 1143   PLT 43 (LL) 09/02/2021 1655   MCV 97.8 06/20/2024 1143   MCV 96 09/02/2021 1655   MCH 31.1 06/20/2024 1143   MCHC 31.8 06/20/2024 1143   RDW 14.7 06/20/2024 1143   RDW 14.0 09/02/2021 1655   LYMPHSABS 1.2 06/20/2024 1143   LYMPHSABS 1.2 09/02/2021 1655   MONOABS 0.3 06/20/2024 1143   EOSABS 0.1 06/20/2024 1143   EOSABS 0.3 09/02/2021 1655   BASOSABS 0.0 06/20/2024 1143   BASOSABS 0.0 09/02/2021 1655      Latest Ref Rng & Units 06/20/2024   11:43 AM 06/16/2024  9:02 AM 05/31/2024    8:56 AM  CMP  Glucose 70 - 99 mg/dL 825  871  860   BUN 8 - 23 mg/dL 79  71  71   Creatinine 0.61 - 1.24 mg/dL 8.15  8.21  8.35   Sodium 135 - 145 mmol/L 137  136  137   Potassium 3.5 - 5.1 mmol/L 5.7  5.4  5.4   Chloride 98 - 111 mmol/L 104  107  105   CO2 22 - 32 mmol/L 23  22  22    Calcium  8.9 - 10.3 mg/dL 8.8  8.9  9.0   Total Protein 6.5 - 8.1 g/dL 6.8  6.5  6.9   Total Bilirubin 0.0 - 1.2 mg/dL 0.7  0.7  0.8   Alkaline Phos 38 - 126 U/L 129  139  170   AST 15 - 41 U/L 30  29  42   ALT 0 - 44 U/L 32  31  50     DIAGNOSTIC IMAGING:  I have independently reviewed the relevant imaging and discussed with the patient.   WRAP UP:  All questions were answered. The patient knows to call the clinic with any problems, questions or concerns.  Medical decision making: Moderate  Time spent on visit: I spent 20 minutes  counseling the patient face to face. The total time spent in the appointment was 30 minutes and more than 50% was on counseling.  Pleasant CHRISTELLA Barefoot, PA-C  ***

## 2024-07-05 ENCOUNTER — Inpatient Hospital Stay: Attending: Physician Assistant | Admitting: Physician Assistant

## 2024-07-05 ENCOUNTER — Inpatient Hospital Stay

## 2024-07-05 ENCOUNTER — Other Ambulatory Visit (HOSPITAL_COMMUNITY)
Admission: RE | Admit: 2024-07-05 | Discharge: 2024-07-05 | Disposition: A | Discharge status: Home / Self Care | Source: Ambulatory Visit | Attending: Internal Medicine | Admitting: Internal Medicine

## 2024-07-05 ENCOUNTER — Other Ambulatory Visit (HOSPITAL_COMMUNITY)
Admission: RE | Admit: 2024-07-05 | Discharge: 2024-07-05 | Disposition: A | Discharge status: Home / Self Care | Source: Ambulatory Visit | Attending: Gastroenterology | Admitting: Gastroenterology

## 2024-07-05 VITALS — BP 152/60 | HR 64 | Temp 97.5°F | Resp 18 | Wt 189.8 lb

## 2024-07-05 DIAGNOSIS — Z7901 Long term (current) use of anticoagulants: Secondary | ICD-10-CM | POA: Diagnosis not present

## 2024-07-05 DIAGNOSIS — Z86718 Personal history of other venous thrombosis and embolism: Secondary | ICD-10-CM | POA: Insufficient documentation

## 2024-07-05 DIAGNOSIS — Z79899 Other long term (current) drug therapy: Secondary | ICD-10-CM | POA: Insufficient documentation

## 2024-07-05 DIAGNOSIS — Z86711 Personal history of pulmonary embolism: Secondary | ICD-10-CM | POA: Diagnosis not present

## 2024-07-05 DIAGNOSIS — K900 Celiac disease: Secondary | ICD-10-CM | POA: Diagnosis not present

## 2024-07-05 DIAGNOSIS — D696 Thrombocytopenia, unspecified: Secondary | ICD-10-CM | POA: Insufficient documentation

## 2024-07-05 DIAGNOSIS — N1832 Chronic kidney disease, stage 3b: Secondary | ICD-10-CM | POA: Diagnosis present

## 2024-07-05 DIAGNOSIS — E538 Deficiency of other specified B group vitamins: Secondary | ICD-10-CM | POA: Diagnosis not present

## 2024-07-05 DIAGNOSIS — Z8546 Personal history of malignant neoplasm of prostate: Secondary | ICD-10-CM | POA: Diagnosis not present

## 2024-07-05 DIAGNOSIS — D849 Immunodeficiency, unspecified: Secondary | ICD-10-CM | POA: Insufficient documentation

## 2024-07-05 DIAGNOSIS — Z944 Liver transplant status: Secondary | ICD-10-CM | POA: Diagnosis present

## 2024-07-05 DIAGNOSIS — K7460 Unspecified cirrhosis of liver: Secondary | ICD-10-CM | POA: Insufficient documentation

## 2024-07-05 DIAGNOSIS — E139 Other specified diabetes mellitus without complications: Secondary | ICD-10-CM | POA: Insufficient documentation

## 2024-07-05 DIAGNOSIS — E039 Hypothyroidism, unspecified: Secondary | ICD-10-CM | POA: Diagnosis not present

## 2024-07-05 DIAGNOSIS — I2699 Other pulmonary embolism without acute cor pulmonale: Secondary | ICD-10-CM | POA: Diagnosis not present

## 2024-07-05 DIAGNOSIS — D61818 Other pancytopenia: Secondary | ICD-10-CM | POA: Diagnosis not present

## 2024-07-05 DIAGNOSIS — Z923 Personal history of irradiation: Secondary | ICD-10-CM | POA: Insufficient documentation

## 2024-07-05 DIAGNOSIS — D631 Anemia in chronic kidney disease: Secondary | ICD-10-CM | POA: Diagnosis not present

## 2024-07-05 LAB — COMPREHENSIVE METABOLIC PANEL WITH GFR
ALT: 29 U/L (ref 0–44)
AST: 29 U/L (ref 15–41)
Albumin: 3.5 g/dL (ref 3.5–5.0)
Alkaline Phosphatase: 115 U/L (ref 38–126)
Anion gap: 11 (ref 5–15)
BUN: 65 mg/dL — ABNORMAL HIGH (ref 8–23)
CO2: 23 mmol/L (ref 22–32)
Calcium: 9.2 mg/dL (ref 8.9–10.3)
Chloride: 104 mmol/L (ref 98–111)
Creatinine, Ser: 1.8 mg/dL — ABNORMAL HIGH (ref 0.61–1.24)
GFR, Estimated: 42 mL/min — ABNORMAL LOW (ref >60)
Glucose, Bld: 140 mg/dL — ABNORMAL HIGH (ref 70–99)
Potassium: 5.1 mmol/L (ref 3.5–5.1)
Sodium: 138 mmol/L (ref 135–145)
Total Bilirubin: 0.7 mg/dL (ref 0.0–1.2)
Total Protein: 7 g/dL (ref 6.5–8.1)

## 2024-07-05 LAB — CBC WITH DIFFERENTIAL/PLATELET
Abs Immature Granulocytes: 0.01 K/uL (ref 0.00–0.07)
Basophils Absolute: 0 K/uL (ref 0.0–0.1)
Basophils Relative: 1 %
Eosinophils Absolute: 0.2 K/uL (ref 0.0–0.5)
Eosinophils Relative: 6 %
HCT: 34.8 % — ABNORMAL LOW (ref 39.0–52.0)
Hemoglobin: 11.1 g/dL — ABNORMAL LOW (ref 13.0–17.0)
Immature Granulocytes: 0 %
Lymphocytes Relative: 37 %
Lymphs Abs: 1.3 K/uL (ref 0.7–4.0)
MCH: 30.9 pg (ref 26.0–34.0)
MCHC: 31.9 g/dL (ref 30.0–36.0)
MCV: 96.9 fL (ref 80.0–100.0)
Monocytes Absolute: 0.3 K/uL (ref 0.1–1.0)
Monocytes Relative: 9 %
Neutro Abs: 1.6 K/uL — ABNORMAL LOW (ref 1.7–7.7)
Neutrophils Relative %: 47 %
Platelets: 140 K/uL — ABNORMAL LOW (ref 150–400)
RBC: 3.59 MIL/uL — ABNORMAL LOW (ref 4.22–5.81)
RDW: 14.6 % (ref 11.5–15.5)
WBC: 3.5 K/uL — ABNORMAL LOW (ref 4.0–10.5)
nRBC: 0 % (ref 0.0–0.2)

## 2024-07-05 LAB — LIPID PANEL
Cholesterol: 114 mg/dL (ref 0–200)
HDL: 39 mg/dL — ABNORMAL LOW (ref >40)
LDL Cholesterol: 56 mg/dL (ref 0–99)
Total CHOL/HDL Ratio: 2.9 ratio
Triglycerides: 96 mg/dL (ref <150)
VLDL: 19 mg/dL (ref 0–40)

## 2024-07-05 LAB — MAGNESIUM: Magnesium: 1.9 mg/dL (ref 1.7–2.4)

## 2024-07-05 LAB — HEMOGLOBIN A1C
Hgb A1c MFr Bld: 5.2 % (ref 4.8–5.6)
Mean Plasma Glucose: 102.54 mg/dL

## 2024-07-05 LAB — T4, FREE: Free T4: 0.93 ng/dL (ref 0.61–1.12)

## 2024-07-05 LAB — TSH: TSH: 4.656 u[IU]/mL — ABNORMAL HIGH (ref 0.350–4.500)

## 2024-07-05 NOTE — Patient Instructions (Signed)
 [facility] at [facility] **VISIT SUMMARY & IMPORTANT INSTRUCTIONS **   You were seen today by Pleasant Barefoot PA-C for your follow-up visit.   We will continue Retacrit  every 2 weeks. You should continue taking Eliquis for your blood clot for the foreseeable future.  We will check some additional labs in 3 months to see if you have any underlying blood clotting disorders. Take Vitamin B12 1,000 mcg every other day (or 500 mcg daily)  FOLLOW-UP APPOINTMENT: 3 months   ** Thank you for trusting me with your healthcare!  I strive to provide all of my patients with quality care at each visit.  If you receive a survey for this visit, I would be so grateful to you for taking the time to provide feedback.  Thank you in advance!  ~ Maryem Shuffler                                        Dr. Mickiel Davonna Pleasant Barefoot, PA-C    Delon Hope, NP   - - - - - - - - - - - - - - - - - -     Thank you for choosing [facility] at [facility] to provide your oncology and hematology care.  To afford each patient quality time with our provider, please arrive at least 15 minutes before your scheduled appointment time.   If you have a lab appointment with the Cancer Center please come in thru the Main Entrance and check in at the main information desk.  You need to re-schedule your appointment should you arrive 10 or more minutes late.  We strive to give you quality time with our providers, and arriving late affects you and other patients whose appointments are after yours.  Also, if you no show three or more times for appointments you may be dismissed from the clinic at the providers discretion.     Again, thank you for choosing [facility].  Our hope is that these requests will decrease the amount of time that you wait before being seen by our physicians.       _____________________________________________________________  Should you have  questions after your visit to [facility], please contact our office at [phone] and follow the prompts.  Our office hours are 8:00 a.m. and 4:30 p.m. Monday - Friday.  Please note that voicemails left after 4:00 p.m. may not be returned until the following business day.  We are closed weekends and major holidays.  You do have access to a nurse 24-7, just call the main number to the clinic [phone] and do not press any options, hold on the line and a nurse will answer the phone.    For prescription refill requests, have your pharmacy contact our office and allow 72 hours.

## 2024-07-05 NOTE — Progress Notes (Signed)
 Pt.'s Hgb 11.1. per PA orders pt does not need retacrit  shot today. Pt updated and agrees to plan. All follow ups as scheduled.   Michael Harmon

## 2024-07-07 LAB — TACROLIMUS LEVEL: Tacrolimus (FK506) - LabCorp: 5.3 ng/mL (ref 5.0–20.0)

## 2024-07-09 ENCOUNTER — Encounter: Payer: Self-pay | Admitting: Oncology

## 2024-07-13 ENCOUNTER — Ambulatory Visit

## 2024-07-13 ENCOUNTER — Ambulatory Visit: Payer: Self-pay

## 2024-07-13 VITALS — BP 157/66 | HR 63 | Temp 98.0°F | Ht 67.0 in | Wt 194.1 lb

## 2024-07-13 DIAGNOSIS — B029 Zoster without complications: Secondary | ICD-10-CM | POA: Diagnosis not present

## 2024-07-13 MED ORDER — VALACYCLOVIR HCL 1 G PO TABS: 1000.0000 mg | ORAL_TABLET | Freq: Three times a day (TID) | ORAL | 0 refills | Status: AC

## 2024-07-13 NOTE — Telephone Encounter (Signed)
 Patient scheduled.

## 2024-07-13 NOTE — Progress Notes (Signed)
 Established Patient Office Visit  Subjective   Patient ID: Michael Harmon, male    DOB: [birth year]  Age: 63 y.o. MRN: [id number]  Chief Complaint  Patient presents with   Medical Management of Chronic Issues    Possible shingles rash on right side mid of back, pt states its painful and itchy     HPI Discussed the use of AI scribe software for clinical note transcription with the patient, who gave verbal consent to proceed.  History of Present Illness    Michael Harmon is a 63 year old male with a history of liver transplant who presents with a shingles outbreak.  Herpes zoster (shingles) outbreak - Onset of symptoms yesterday afternoon with stinging and welts progressing to blisters and bumps - Rash localized to the right side of the back at the level of the liver incision - Burning pain began a couple of days prior to rash appearance - No fever or other systemic symptoms - History of chickenpox in childhood  Immunosuppression status - History of liver transplant in March 2024 - Currently immunosuppressed - Previous adverse reaction to influenza vaccine with significant leukopenia; advised to avoid vaccines without transplant coordinator clearance - Received antivirals for the first couple of months post-transplant; no antivirals taken in over a year  Renal dysfunction - 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 - Estimated 40% kidney function - Significant decline in kidney function earlier this year requiring hospitalization and nursing home stay   Patient Active Problem List   Diagnosis Date Noted   Pulmonary embolus (HCC) 03/17/2024   Hypothyroidism 03/17/2024   PEG (percutaneous endoscopic gastrostomy) status (HCC) 03/17/2024   Delirium 03/16/2024   Hospital discharge follow-up 03/16/2024   Physical deconditioning 12/07/2023   Recurrent falls 12/07/2023   Edema due to hypoalbuminemia 09/17/2023   Hyperkalemia 08/18/2023   Chronic  kidney disease, stage 3b (HCC) 08/18/2023   S/P liver transplant (HCC) 03/17/2023   Immunosuppressed status (HCC) 03/17/2023   Anemia in chronic kidney disease (CKD) 11/23/2022   Encounter for general adult medical examination with abnormal findings 09/08/2022   Statins contraindicated 09/08/2022   Hemorrhoids 03/06/2022   Leg swelling 03/06/2022   GERD (gastroesophageal reflux disease) 03/02/2022   History of iron  deficiency anemia 03/02/2022   Murmur, heart 03/02/2022   Obesity (BMI 30.0-34.9) 03/02/2022   Other neutropenia (HCC) 02/13/2022   Malignant neoplasm of prostate (HCC) 10/03/2021   Iron  deficiency anemia due to chronic blood loss 08/13/2021   Renal cyst 09/03/2017   Essential hypertension 07/06/2017   Type 2 diabetes mellitus with other specified complication (HCC) 07/06/2017   HLD (hyperlipidemia) 07/06/2017   Environmental allergies 07/06/2017   BPH with obstruction/lower urinary tract symptoms 07/06/2017   Situational depression 07/06/2017   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) 07/06/2017   Portal hypertension (HCC) 06/24/2017   Vitamin D  deficiency 08/06/2016   Hepatic cirrhosis (HCC) 08/06/2016   Hallux valgus 07/14/2016   Thrombocytopenia (HCC) 04/07/2016   Adult form of celiac disease 04/07/2016   Hx of adenomatous colonic polyps 04/07/2016   ROS    Objective:     BP (!) 157/66   Pulse 63   Temp 98 F (36.7 C) (Oral)   Ht 5' 7 (1.702 m)   Wt 194 lb 1.3 oz (88 kg)   SpO2 98%   BMI 30.40 kg/m  BP Readings from Last 3 Encounters:  07/13/24 (!) 157/66  07/05/24 (!) 152/60  06/20/24 (!) 151/70   Wt Readings  from Last 3 Encounters:  07/13/24 194 lb 1.3 oz (88 kg)  07/05/24 189 lb 12.8 oz (86.1 kg)  03/16/24 168 lb (76.2 kg)     Physical Exam Vitals and nursing note reviewed.  Constitutional:      Appearance: Normal appearance.  HENT:     Head: Normocephalic.  Eyes:     Extraocular Movements: Extraocular movements intact.      Pupils: Pupils are equal, round, and reactive to light.  Cardiovascular:     Rate and Rhythm: Normal rate and regular rhythm.  Pulmonary:     Effort: Pulmonary effort is normal.     Breath sounds: Normal breath sounds.  Musculoskeletal:     Cervical back: Normal range of motion and neck supple.  Skin:    Findings: Rash (right subscapular pustular lesions) present.  Neurological:     Mental Status: He is alert and oriented to person, place, and time.  Psychiatric:        Mood and Affect: Mood normal.        Thought Content: Thought content normal.      No results found for any visits on 07/13/24.    The ASCVD Risk score (Arnett DK, et al., 2019) failed to calculate for the following reasons:   The valid total cholesterol range is 130 to 320 mg/dL    Assessment & Plan:   Problem List Items Addressed This Visit   None Visit Diagnoses       Herpes zoster without complication    -  Primary   treat with Valtrex .  discussed OTC topical medications for symptom relief.   Relevant Medications   valACYclovir  (VALTREX ) 1000 MG tablet       No follow-ups on file.    Leita Longs, FNP

## 2024-07-13 NOTE — Telephone Encounter (Signed)
 FYI Only or Action Required?: FYI only for provider.  Patient was last seen in primary care on 03/16/2024 by Tobie Suzzane POUR, MD.  Called Nurse Triage reporting Herpes Zoster.  Symptoms began yesterday.  Interventions attempted: Nothing.  Symptoms are: painful, itchy blistered rash to right mid back about 4-5 inches gradually worsening.  Triage Disposition: See HCP Within 4 Hours (Or PCP Triage)  Patient/caregiver understands and will follow disposition?: Yes             Copied from CRM #[id number]. Topic: Clinical - Red Word Triage >> Jul 13, 2024  8:17 AM Willma R wrote: [facility] that prompted transfer to Nurse Triage: Patient thinks he has singles, spread of blister bumps across his back towards his spine. Bumps are painful and raised. Reason for Disposition  [1] Shingles rash (matches SYMPTOMS) AND [2] weak immune system (e.g., HIV positive, cancer chemotherapy, chronic steroid treatment, splenectomy) AND [3] NOT taking antiviral medication  Answer Assessment - Initial Assessment Questions 1. APPEARANCE of RASH: What does the rash look like?      He states he can't see that part of his back, he states he ran his hand across his back and can feel a cluster of blisters.  2. LOCATION: Where is the rash located?      Right side of middle back, 4-5 inches and spreading.  3. ONSET: When did the rash start?      Yesterday.  4. ITCHING: Does the rash itch? If Yes, ask: How bad is the itch?  (Scale 1-10; or mild, moderate, severe)     Yes, mild to moderate.  5. PAIN: Does the rash hurt? If Yes, ask: How bad is the pain?  (Scale 0-10; or none, mild, moderate, severe)     Yes. 3/10. Increasing and worsening.  6. OTHER SYMPTOMS: Do you have any other symptoms? (e.g., fever)     Denies fever, facial involvement, difficulty breathing.  7. PREGNANCY: Is there any chance you are pregnant? When was your last menstrual period?     N/A.  Protocols used:  Shingles (Zoster)-A-AH

## 2024-07-19 ENCOUNTER — Inpatient Hospital Stay

## 2024-07-20 ENCOUNTER — Inpatient Hospital Stay

## 2024-07-20 VITALS — BP 142/55 | HR 72 | Temp 97.9°F

## 2024-07-20 DIAGNOSIS — D631 Anemia in chronic kidney disease: Secondary | ICD-10-CM

## 2024-07-20 DIAGNOSIS — N1832 Chronic kidney disease, stage 3b: Secondary | ICD-10-CM | POA: Diagnosis not present

## 2024-07-20 MED ORDER — EPOETIN ALFA-EPBX 20000 UNIT/ML IJ SOLN
20000.0000 [IU] | Freq: Once | INTRAMUSCULAR | Status: AC
  Administered 2024-07-20: 20000 [IU] via SUBCUTANEOUS
  Filled 2024-07-20: qty 1

## 2024-07-20 NOTE — Patient Instructions (Signed)
 [facility] [place] - A DEPT OF [facility]  Discharge Instructions: Thank you for choosing  Cancer Center to provide your oncology and hematology care.  If you have a lab appointment with the Cancer Center - please note that after April 8th, 2024, all labs will be drawn in the cancer center.  You do not have to check in or register with the main entrance as you have in the past but will complete your check-in in the cancer center.  Wear comfortable clothing and clothing appropriate for easy access to any Portacath or PICC line.   We strive to give you quality time with your provider. You may need to reschedule your appointment if you arrive late (15 or more minutes).  Arriving late affects you and other patients whose appointments are after yours.  Also, if you miss three or more appointments without notifying the office, you may be dismissed from the clinic at the provider's discretion.      For prescription refill requests, have your pharmacy contact our office and allow 72 hours for refills to be completed.    Today you received the following chemotherapy and/or immunotherapy agents Retacrit. Epoetin Alfa Injection What is this medication? EPOETIN ALFA (e POE e tin AL fa) treats low levels of red blood cells (anemia) caused by kidney disease, chemotherapy, or HIV medications. It can also be used in people who are at risk for blood loss during surgery. It works by Systems analyst make more red blood cells, which reduces the need for blood transfusions. This medicine may be used for other purposes; ask your health care provider or pharmacist if you have questions. COMMON BRAND NAME(S): Epogen, Procrit, Retacrit What should I tell my care team before I take this medication? They need to know if you have any of these conditions: Blood clots Cancer Heart disease High blood pressure On dialysis Seizures Stroke An unusual or allergic reaction to epoetin  alfa, albumin, benzyl alcohol, other medications, foods, dyes, or preservatives Pregnant or trying to get pregnant Breast-feeding How should I use this medication? This medication is injected into a vein or under the skin. It is usually given by your care team in a hospital or clinic setting. It may also be given at home. If you get this medication at home, you will be taught how to prepare and give it. Use exactly as directed. Take it as directed on the prescription label at the same time every day. Keep taking it unless your care team tells you to stop. It is important that you put your used needles and syringes in a special sharps container. Do not put them in a trash can. If you do not have a sharps container, call your pharmacist or care team to get one. A special MedGuide will be given to you by the pharmacist with each prescription and refill. Be sure to read this information carefully each time. Talk to your care team about the use of this medication in children. While this medication may be used in children as young as 1 month of age for selected conditions, precautions do apply. Overdosage: If you think you have taken too much of this medicine contact a poison control center or emergency room at once. NOTE: This medicine is only for you. Do not share this medicine with others. What if I miss a dose? If you miss a dose, take it as soon as you can. If it is almost time for  your next dose, take only that dose. Do not take double or extra doses. What may interact with this medication? Darbepoetin alfa Methoxy polyethylene glycol-epoetin beta This list may not describe all possible interactions. Give your health care provider a list of all the medicines, herbs, non-prescription drugs, or dietary supplements you use. Also tell them if you smoke, drink alcohol, or use illegal drugs. Some items may interact with your medicine. What should I watch for while using this medication? Visit your care  team for regular checks on your progress. Check your blood pressure as directed. Know what your blood pressure should be and when to contact your care team. Your condition will be monitored carefully while you are receiving this medication. You may need blood work while taking this medication. What side effects may I notice from receiving this medication? Side effects that you should report to your care team as soon as possible: Allergic reactions--skin rash, itching, hives, swelling of the face, lips, tongue, or throat Blood clot--pain, swelling, or warmth in the leg, shortness of breath, chest pain Heart attack--pain or tightness in the chest, shoulders, arms, or jaw, nausea, shortness of breath, cold or clammy skin, feeling faint or lightheaded Increase in blood pressure Rash, fever, and swollen lymph nodes Redness, blistering, peeling, or loosening of the skin, including inside the mouth Seizures Stroke--sudden numbness or weakness of the face, arm, or leg, trouble speaking, confusion, trouble walking, loss of balance or coordination, dizziness, severe headache, change in vision Side effects that usually do not require medical attention (report to your care team if they continue or are bothersome): Bone, joint, or muscle pain Cough Headache Nausea Pain, redness, or irritation at injection site This list may not describe all possible side effects. Call your doctor for medical advice about side effects. You may report side effects to FDA at 1-800-FDA-1088. Where should I keep my medication? Keep out of the reach of children and pets. Store in a refrigerator. Do not freeze. Do not shake. Protect from light. Keep this medication in the original container until you are ready to take it. See product for storage information. Get rid of any unused medication after the expiration date. To get rid of medications that are no longer needed or have expired: Take the medication to a medication take-back  program. Check with your pharmacy or law enforcement to find a location. If you cannot return the medication, ask your pharmacist or care team how to get rid of the medication safely. NOTE: This sheet is a summary. It may not cover all possible information. If you have questions about this medicine, talk to your doctor, pharmacist, or health care provider.  2024 Elsevier/Gold Standard (2022-02-20 00:00:00)       To help prevent nausea and vomiting after your treatment, we encourage you to take your nausea medication as directed.  BELOW ARE SYMPTOMS THAT SHOULD BE REPORTED IMMEDIATELY: *FEVER GREATER THAN 100.4 F (38 C) OR HIGHER *CHILLS OR SWEATING *NAUSEA AND VOMITING THAT IS NOT CONTROLLED WITH YOUR NAUSEA MEDICATION *UNUSUAL SHORTNESS OF BREATH *UNUSUAL BRUISING OR BLEEDING *URINARY PROBLEMS (pain or burning when urinating, or frequent urination) *BOWEL PROBLEMS (unusual diarrhea, constipation, pain near the anus) TENDERNESS IN MOUTH AND THROAT WITH OR WITHOUT PRESENCE OF ULCERS (sore throat, sores in mouth, or a toothache) UNUSUAL RASH, SWELLING OR PAIN  UNUSUAL VAGINAL DISCHARGE OR ITCHING   Items with * indicate a potential emergency and should be followed up as soon as possible or go to the Emergency Department  if any problems should occur.  Please show the CHEMOTHERAPY ALERT CARD or IMMUNOTHERAPY ALERT CARD at check-in to the Emergency Department and triage nurse.  Should you have questions after your visit or need to cancel or reschedule your appointment, please contact [facility] [place] - A DEPT OF [facility] [phone]  and follow the prompts.  Office hours are 8:00 a.m. to 4:30 p.m. Monday - Friday. Please note that voicemails left after 4:00 p.m. may not be returned until the following business day.  We are closed weekends and major holidays. You have access to a nurse at all times for urgent questions. Please call the main number to the clinic  [phone] and follow the prompts.  For any non-urgent questions, you may also contact your provider using MyChart. We now offer e-Visits for anyone 88 and older to request care online for non-urgent symptoms. For details visit [email or web address].   Also download the MyChart app! Go to the app store, search "MyChart", open the app, select [place], and log in with your MyChart username and password.

## 2024-07-20 NOTE — Progress Notes (Signed)
 Patient presents today for Retacrit  injection. Lab appointment canceled due to patient had labs performed on 07-19-2024 and HGB was 10.2. Patient will receive Retacrit  injection today. Labs in [place] and performed at [facility]. Labs in care everywhere.   Michael Harmon presents today for injection per the provider's orders.  Retacrit   administration without incident; injection site WNL; see MAR for injection details.  Patient tolerated procedure well and without incident.  No questions or complaints noted at this time.  Discharged from clinic ambulatory in stable condition. Alert and oriented x 3. F/U with [facility] as scheduled.

## 2024-07-22 ENCOUNTER — Encounter: Payer: Self-pay | Admitting: Internal Medicine

## 2024-07-24 ENCOUNTER — Encounter: Payer: Self-pay | Admitting: Physician Assistant

## 2024-07-24 ENCOUNTER — Other Ambulatory Visit: Payer: Self-pay | Admitting: Physician Assistant

## 2024-07-24 NOTE — Telephone Encounter (Signed)
 Please advise.  Labs are in Care Everywhere.

## 2024-07-24 NOTE — Progress Notes (Signed)
 Nephrologist requests IV iron  due to functional iron  deficiency in the setting of anemia of CKD. We will schedule for Venofer  300 mg x 1.  Pleasant CHRISTELLA Barefoot, PA-C 07/24/24 7:50 PM

## 2024-07-26 ENCOUNTER — Other Ambulatory Visit: Payer: Self-pay | Admitting: *Deleted

## 2024-07-26 ENCOUNTER — Inpatient Hospital Stay

## 2024-07-26 ENCOUNTER — Encounter: Payer: Self-pay | Admitting: *Deleted

## 2024-07-26 VITALS — BP 135/67 | HR 59 | Temp 97.9°F | Resp 18 | Wt 196.0 lb

## 2024-07-26 DIAGNOSIS — N1832 Chronic kidney disease, stage 3b: Secondary | ICD-10-CM | POA: Diagnosis not present

## 2024-07-26 DIAGNOSIS — D500 Iron deficiency anemia secondary to blood loss (chronic): Secondary | ICD-10-CM

## 2024-07-26 DIAGNOSIS — R195 Other fecal abnormalities: Secondary | ICD-10-CM

## 2024-07-26 DIAGNOSIS — Z944 Liver transplant status: Secondary | ICD-10-CM

## 2024-07-26 MED ORDER — SODIUM CHLORIDE 0.9 % IV SOLN: INTRAVENOUS | Status: DC

## 2024-07-26 MED ORDER — IRON SUCROSE 300 MG IVPB - SIMPLE MED
300.0000 mg | Freq: Once | Status: AC
  Administered 2024-07-26: 300 mg via INTRAVENOUS
  Filled 2024-07-26: qty 300

## 2024-07-26 MED ORDER — ACETAMINOPHEN 325 MG PO TABS
650.0000 mg | ORAL_TABLET | Freq: Once | ORAL | Status: AC
  Administered 2024-07-26: 650 mg via ORAL
  Filled 2024-07-26: qty 2

## 2024-07-26 MED ORDER — CETIRIZINE HCL 10 MG PO TABS
10.0000 mg | ORAL_TABLET | Freq: Once | ORAL | Status: AC
  Administered 2024-07-26: 10 mg via ORAL
  Filled 2024-07-26: qty 1

## 2024-07-26 NOTE — Patient Instructions (Signed)
 [facility] [place] - A DEPT OF [facility]  Discharge Instructions: Thank you for choosing [facility] to provide your oncology and hematology care.  If you have a lab appointment with the Cancer Center - please note that after April 8th, 2024, all labs will be drawn in the cancer center.  You do not have to check in or register with the main entrance as you have in the past but will complete your check-in in the cancer center.  Wear comfortable clothing and clothing appropriate for easy access to any Portacath or PICC line.   We strive to give you quality time with your provider. You may need to reschedule your appointment if you arrive late (15 or more minutes).  Arriving late affects you and other patients whose appointments are after yours.  Also, if you miss three or more appointments without notifying the office, you may be dismissed from the clinic at the provider's discretion.      For prescription refill requests, have your pharmacy contact our office and allow 72 hours for refills to be completed.    Today you received Venofer  300 mg IV iron  infusion.      BELOW ARE SYMPTOMS THAT SHOULD BE REPORTED IMMEDIATELY: *FEVER GREATER THAN 100.4 F (38 C) OR HIGHER *CHILLS OR SWEATING *NAUSEA AND VOMITING THAT IS NOT CONTROLLED WITH YOUR NAUSEA MEDICATION *UNUSUAL SHORTNESS OF BREATH *UNUSUAL BRUISING OR BLEEDING *URINARY PROBLEMS (pain or burning when urinating, or frequent urination) *BOWEL PROBLEMS (unusual diarrhea, constipation, pain near the anus) TENDERNESS IN MOUTH AND THROAT WITH OR WITHOUT PRESENCE OF ULCERS (sore throat, sores in mouth, or a toothache) UNUSUAL RASH, SWELLING OR PAIN  UNUSUAL VAGINAL DISCHARGE OR ITCHING   Items with * indicate a potential emergency and should be followed up as soon as possible or go to the Emergency Department if any problems should occur.  Please show the CHEMOTHERAPY ALERT CARD or IMMUNOTHERAPY ALERT  CARD at check-in to the Emergency Department and triage nurse.  Should you have questions after your visit or need to cancel or reschedule your appointment, please contact [facility] [place] - A DEPT OF [facility] [phone]  and follow the prompts.  Office hours are 8:00 a.m. to 4:30 p.m. Monday - Friday. Please note that voicemails left after 4:00 p.m. may not be returned until the following business day.  We are closed weekends and major holidays. You have access to a nurse at all times for urgent questions. Please call the main number to the clinic [phone] and follow the prompts.  For any non-urgent questions, you may also contact your provider using MyChart. We now offer e-Visits for anyone 50 and older to request care online for non-urgent symptoms. For details visit [email or web address].   Also download the MyChart app! Go to the app store, search MyChart, open the app, select [place], and log in with your MyChart username and password.

## 2024-07-26 NOTE — Progress Notes (Signed)
 Patient presents today for iron infusion.  Patient is in satisfactory condition with no new complaints voiced.  Vital signs are stable.  We will proceed with infusion per provider orders.    Peripheral IV started with good blood return pre and post infusion.  Venofer 300 mg given today per MD orders. Tolerated infusion without adverse affects. Vital signs stable. No complaints at this time. Discharged from clinic ambulatory in stable condition. Alert and oriented x 3. F/U with [facility] as scheduled.

## 2024-07-26 NOTE — Progress Notes (Unsigned)
 Patient present for iron  infusion today.  Reports dark stools for over a week.  Stool cards provided.  Pleasant Barefoot, PAC aware.  Encouraged that he reach out to Dr. Shaaron for recommendations.  Verbalized understanding.

## 2024-07-28 ENCOUNTER — Other Ambulatory Visit (HOSPITAL_COMMUNITY): Payer: Self-pay

## 2024-07-28 DIAGNOSIS — Z944 Liver transplant status: Secondary | ICD-10-CM

## 2024-07-28 DIAGNOSIS — N1832 Chronic kidney disease, stage 3b: Secondary | ICD-10-CM | POA: Diagnosis not present

## 2024-07-28 DIAGNOSIS — D500 Iron deficiency anemia secondary to blood loss (chronic): Secondary | ICD-10-CM

## 2024-07-28 DIAGNOSIS — R195 Other fecal abnormalities: Secondary | ICD-10-CM

## 2024-07-28 LAB — OCCULT BLOOD X 1 CARD TO LAB, STOOL
Fecal Occult Bld: NEGATIVE
Fecal Occult Bld: NEGATIVE
Fecal Occult Bld: NEGATIVE

## 2024-07-31 ENCOUNTER — Ambulatory Visit: Payer: Self-pay | Admitting: Physician Assistant

## 2024-08-02 ENCOUNTER — Inpatient Hospital Stay

## 2024-08-02 ENCOUNTER — Inpatient Hospital Stay: Attending: Physician Assistant

## 2024-08-02 VITALS — BP 147/69 | HR 62 | Temp 96.4°F | Resp 20

## 2024-08-02 DIAGNOSIS — N1832 Chronic kidney disease, stage 3b: Secondary | ICD-10-CM | POA: Insufficient documentation

## 2024-08-02 DIAGNOSIS — D631 Anemia in chronic kidney disease: Secondary | ICD-10-CM | POA: Diagnosis not present

## 2024-08-02 MED ORDER — EPOETIN ALFA-EPBX 20000 UNIT/ML IJ SOLN
20000.0000 [IU] | Freq: Once | INTRAMUSCULAR | Status: AC
  Administered 2024-08-02: 20000 [IU] via SUBCUTANEOUS
  Filled 2024-08-02: qty 1

## 2024-08-02 NOTE — Patient Instructions (Signed)
 [facility] [place] - A DEPT OF [facility]  Discharge Instructions: Thank you for choosing [facility] to provide your oncology and hematology care.  If you have a lab appointment with the Cancer Center - please note that after April 8th, 2024, all labs will be drawn in the cancer center.  You do not have to check in or register with the main entrance as you have in the past but will complete your check-in in the cancer center.  Wear comfortable clothing and clothing appropriate for easy access to any Portacath or PICC line.   We strive to give you quality time with your provider. You may need to reschedule your appointment if you arrive late (15 or more minutes).  Arriving late affects you and other patients whose appointments are after yours.  Also, if you miss three or more appointments without notifying the office, you may be dismissed from the clinic at the provider's discretion.      For prescription refill requests, have your pharmacy contact our office and allow 72 hours for refills to be completed.    Today you received the following injection: retacrit   To help prevent nausea and vomiting after your treatment, we encourage you to take your nausea medication as directed.  BELOW ARE SYMPTOMS THAT SHOULD BE REPORTED IMMEDIATELY: *FEVER GREATER THAN 100.4 F (38 C) OR HIGHER *CHILLS OR SWEATING *NAUSEA AND VOMITING THAT IS NOT CONTROLLED WITH YOUR NAUSEA MEDICATION *UNUSUAL SHORTNESS OF BREATH *UNUSUAL BRUISING OR BLEEDING *URINARY PROBLEMS (pain or burning when urinating, or frequent urination) *BOWEL PROBLEMS (unusual diarrhea, constipation, pain near the anus) TENDERNESS IN MOUTH AND THROAT WITH OR WITHOUT PRESENCE OF ULCERS (sore throat, sores in mouth, or a toothache) UNUSUAL RASH, SWELLING OR PAIN  UNUSUAL VAGINAL DISCHARGE OR ITCHING   Items with * indicate a potential emergency and should be followed up as soon as possible or go to the  Emergency Department if any problems should occur.  Please show the CHEMOTHERAPY ALERT CARD or IMMUNOTHERAPY ALERT CARD at check-in to the Emergency Department and triage nurse.  Should you have questions after your visit or need to cancel or reschedule your appointment, please contact [facility] [place] - A DEPT OF Eligha Brideg[facility] [phone]  and follow the prompts.  Office hours are 8:00 a.m. to 4:30 p.m. Monday - Friday. Please note that voicemails left after 4:00 p.m. may not be returned until the following business day.  We are closed weekends and major holidays. You have access to a nurse at all times for urgent questions. Please call the main number to the clinic [phone] and follow the prompts.  For any non-urgent questions, you may also contact your provider using MyChart. We now offer e-Visits for anyone 29 and older to request care online for non-urgent symptoms. For details visit [email or web address].   Also download the MyChart app! Go to the app store, search "MyChart", open the app, select [place], and log in with your MyChart username and password.

## 2024-08-02 NOTE — Progress Notes (Signed)
 Retacrit injection given per orders. Patient tolerated it well without problems. Vitals stable and discharged home from clinic ambulatory. Follow up as scheduled.

## 2024-08-15 ENCOUNTER — Inpatient Hospital Stay

## 2024-08-15 ENCOUNTER — Other Ambulatory Visit (HOSPITAL_COMMUNITY)
Admission: RE | Admit: 2024-08-15 | Discharge: 2024-08-15 | Disposition: A | Discharge status: Home / Self Care | Source: Ambulatory Visit | Attending: Gastroenterology | Admitting: Gastroenterology

## 2024-08-15 DIAGNOSIS — Z794 Long term (current) use of insulin: Secondary | ICD-10-CM | POA: Diagnosis present

## 2024-08-15 DIAGNOSIS — D849 Immunodeficiency, unspecified: Secondary | ICD-10-CM | POA: Insufficient documentation

## 2024-08-15 LAB — CBC WITH DIFFERENTIAL/PLATELET
Abs Immature Granulocytes: 0.01 K/uL (ref 0.00–0.07)
Basophils Absolute: 0 K/uL (ref 0.0–0.1)
Basophils Relative: 0 %
Eosinophils Absolute: 0.2 K/uL (ref 0.0–0.5)
Eosinophils Relative: 5 %
HCT: 34.4 % — ABNORMAL LOW (ref 39.0–52.0)
Hemoglobin: 11.2 g/dL — ABNORMAL LOW (ref 13.0–17.0)
Immature Granulocytes: 0 %
Lymphocytes Relative: 38 %
Lymphs Abs: 1.2 K/uL (ref 0.7–4.0)
MCH: 32.1 pg (ref 26.0–34.0)
MCHC: 32.6 g/dL (ref 30.0–36.0)
MCV: 98.6 fL (ref 80.0–100.0)
Monocytes Absolute: 0.3 K/uL (ref 0.1–1.0)
Monocytes Relative: 10 %
Neutro Abs: 1.4 K/uL — ABNORMAL LOW (ref 1.7–7.7)
Neutrophils Relative %: 47 %
Platelets: 122 K/uL — ABNORMAL LOW (ref 150–400)
RBC: 3.49 MIL/uL — ABNORMAL LOW (ref 4.22–5.81)
RDW: 14.9 % (ref 11.5–15.5)
WBC: 3.1 K/uL — ABNORMAL LOW (ref 4.0–10.5)
nRBC: 0 % (ref 0.0–0.2)

## 2024-08-15 LAB — COMPREHENSIVE METABOLIC PANEL WITH GFR
ALT: 18 U/L (ref 0–44)
AST: 22 U/L (ref 15–41)
Albumin: 3.4 g/dL — ABNORMAL LOW (ref 3.5–5.0)
Alkaline Phosphatase: 138 U/L — ABNORMAL HIGH (ref 38–126)
Anion gap: 6 (ref 5–15)
BUN: 42 mg/dL — ABNORMAL HIGH (ref 8–23)
CO2: 25 mmol/L (ref 22–32)
Calcium: 9 mg/dL (ref 8.9–10.3)
Chloride: 109 mmol/L (ref 98–111)
Creatinine, Ser: 1.92 mg/dL — ABNORMAL HIGH (ref 0.61–1.24)
GFR, Estimated: 38 mL/min — ABNORMAL LOW (ref >60)
Glucose, Bld: 130 mg/dL — ABNORMAL HIGH (ref 70–99)
Potassium: 4.9 mmol/L (ref 3.5–5.1)
Sodium: 140 mmol/L (ref 135–145)
Total Bilirubin: 0.3 mg/dL (ref 0.0–1.2)
Total Protein: 6.3 g/dL — ABNORMAL LOW (ref 6.5–8.1)

## 2024-08-15 LAB — MAGNESIUM: Magnesium: 2.2 mg/dL (ref 1.7–2.4)

## 2024-08-15 NOTE — Progress Notes (Signed)
 No injection needed today per treatment parameters.

## 2024-08-16 ENCOUNTER — Inpatient Hospital Stay

## 2024-08-17 LAB — TACROLIMUS LEVEL: Tacrolimus (FK506) - LabCorp: 5.8 ng/mL (ref 5.0–20.0)

## 2024-08-30 ENCOUNTER — Inpatient Hospital Stay

## 2024-08-30 VITALS — BP 161/69 | HR 68 | Temp 98.4°F | Resp 16

## 2024-08-30 DIAGNOSIS — D631 Anemia in chronic kidney disease: Secondary | ICD-10-CM

## 2024-08-30 DIAGNOSIS — N1832 Chronic kidney disease, stage 3b: Secondary | ICD-10-CM | POA: Diagnosis not present

## 2024-08-30 LAB — CBC
HCT: 32.8 % — ABNORMAL LOW (ref 39.0–52.0)
Hemoglobin: 10.8 g/dL — ABNORMAL LOW (ref 13.0–17.0)
MCH: 32 pg (ref 26.0–34.0)
MCHC: 32.9 g/dL (ref 30.0–36.0)
MCV: 97.3 fL (ref 80.0–100.0)
Platelets: 115 K/uL — ABNORMAL LOW (ref 150–400)
RBC: 3.37 MIL/uL — ABNORMAL LOW (ref 4.22–5.81)
RDW: 13.7 % (ref 11.5–15.5)
WBC: 3.5 K/uL — ABNORMAL LOW (ref 4.0–10.5)
nRBC: 0 % (ref 0.0–0.2)

## 2024-08-30 MED ORDER — EPOETIN ALFA-EPBX 20000 UNIT/ML IJ SOLN
20000.0000 [IU] | Freq: Once | INTRAMUSCULAR | Status: AC
  Administered 2024-08-30: 20000 [IU] via SUBCUTANEOUS
  Filled 2024-08-30: qty 1

## 2024-08-30 NOTE — Progress Notes (Signed)
 Patient's Hgb 10.8 and blood pressure stable.patient tolerated Retacrit  injection with no complaints voiced.  Site clean and dry with no bruising or swelling noted at site.  See MAR for details.  Band aid applied.  Patient stable during and after injection.  Vss with discharge and left in satisfactory condition with no s/s of distress noted. All follow ups as scheduled.   Michael Harmon

## 2024-08-30 NOTE — Patient Instructions (Signed)

## 2024-09-13 ENCOUNTER — Inpatient Hospital Stay

## 2024-09-13 ENCOUNTER — Inpatient Hospital Stay: Attending: Physician Assistant

## 2024-09-13 ENCOUNTER — Ambulatory Visit: Payer: Self-pay | Admitting: Physician Assistant

## 2024-09-13 VITALS — BP 152/77 | HR 91 | Temp 98.0°F | Resp 18

## 2024-09-13 DIAGNOSIS — Z7901 Long term (current) use of anticoagulants: Secondary | ICD-10-CM | POA: Insufficient documentation

## 2024-09-13 DIAGNOSIS — Z86718 Personal history of other venous thrombosis and embolism: Secondary | ICD-10-CM | POA: Insufficient documentation

## 2024-09-13 DIAGNOSIS — I2699 Other pulmonary embolism without acute cor pulmonale: Secondary | ICD-10-CM

## 2024-09-13 DIAGNOSIS — K900 Celiac disease: Secondary | ICD-10-CM | POA: Insufficient documentation

## 2024-09-13 DIAGNOSIS — Z86711 Personal history of pulmonary embolism: Secondary | ICD-10-CM | POA: Insufficient documentation

## 2024-09-13 DIAGNOSIS — K7460 Unspecified cirrhosis of liver: Secondary | ICD-10-CM | POA: Insufficient documentation

## 2024-09-13 DIAGNOSIS — D693 Immune thrombocytopenic purpura: Secondary | ICD-10-CM | POA: Insufficient documentation

## 2024-09-13 DIAGNOSIS — Z923 Personal history of irradiation: Secondary | ICD-10-CM | POA: Insufficient documentation

## 2024-09-13 DIAGNOSIS — N1832 Chronic kidney disease, stage 3b: Secondary | ICD-10-CM | POA: Insufficient documentation

## 2024-09-13 DIAGNOSIS — D631 Anemia in chronic kidney disease: Secondary | ICD-10-CM | POA: Diagnosis not present

## 2024-09-13 DIAGNOSIS — D61818 Other pancytopenia: Secondary | ICD-10-CM | POA: Insufficient documentation

## 2024-09-13 DIAGNOSIS — R5383 Other fatigue: Secondary | ICD-10-CM | POA: Insufficient documentation

## 2024-09-13 DIAGNOSIS — Z8546 Personal history of malignant neoplasm of prostate: Secondary | ICD-10-CM | POA: Diagnosis not present

## 2024-09-13 DIAGNOSIS — Z944 Liver transplant status: Secondary | ICD-10-CM | POA: Insufficient documentation

## 2024-09-13 DIAGNOSIS — E538 Deficiency of other specified B group vitamins: Secondary | ICD-10-CM

## 2024-09-13 LAB — CBC WITH DIFFERENTIAL/PLATELET
Abs Immature Granulocytes: 0 K/uL (ref 0.00–0.07)
Basophils Absolute: 0 K/uL (ref 0.0–0.1)
Basophils Relative: 0 %
Eosinophils Absolute: 0.2 K/uL (ref 0.0–0.5)
Eosinophils Relative: 5 %
HCT: 32.9 % — ABNORMAL LOW (ref 39.0–52.0)
Hemoglobin: 10.6 g/dL — ABNORMAL LOW (ref 13.0–17.0)
Immature Granulocytes: 0 %
Lymphocytes Relative: 44 %
Lymphs Abs: 1.4 K/uL (ref 0.7–4.0)
MCH: 31.5 pg (ref 26.0–34.0)
MCHC: 32.2 g/dL (ref 30.0–36.0)
MCV: 97.6 fL (ref 80.0–100.0)
Monocytes Absolute: 0.3 K/uL (ref 0.1–1.0)
Monocytes Relative: 8 %
Neutro Abs: 1.3 K/uL — ABNORMAL LOW (ref 1.7–7.7)
Neutrophils Relative %: 43 %
Platelets: 119 K/uL — ABNORMAL LOW (ref 150–400)
RBC: 3.37 MIL/uL — ABNORMAL LOW (ref 4.22–5.81)
RDW: 13.9 % (ref 11.5–15.5)
WBC: 3.1 K/uL — ABNORMAL LOW (ref 4.0–10.5)
nRBC: 0 % (ref 0.0–0.2)

## 2024-09-13 LAB — COMPREHENSIVE METABOLIC PANEL WITH GFR
ALT: 14 U/L (ref 0–44)
AST: 21 U/L (ref 15–41)
Albumin: 3.4 g/dL — ABNORMAL LOW (ref 3.5–5.0)
Alkaline Phosphatase: 117 U/L (ref 38–126)
Anion gap: 9 (ref 5–15)
BUN: 41 mg/dL — ABNORMAL HIGH (ref 8–23)
CO2: 24 mmol/L (ref 22–32)
Calcium: 8.3 mg/dL — ABNORMAL LOW (ref 8.9–10.3)
Chloride: 110 mmol/L (ref 98–111)
Creatinine, Ser: 2.32 mg/dL — ABNORMAL HIGH (ref 0.61–1.24)
GFR, Estimated: 31 mL/min — ABNORMAL LOW (ref >60)
Glucose, Bld: 149 mg/dL — ABNORMAL HIGH (ref 70–99)
Potassium: 4.7 mmol/L (ref 3.5–5.1)
Sodium: 142 mmol/L (ref 135–145)
Total Bilirubin: 0.4 mg/dL (ref 0.0–1.2)
Total Protein: 6 g/dL — ABNORMAL LOW (ref 6.5–8.1)

## 2024-09-13 LAB — IRON AND TIBC
Iron: 58 ug/dL (ref 45–182)
Saturation Ratios: 24 % (ref 17.9–39.5)
TIBC: 241 ug/dL — ABNORMAL LOW (ref 250–450)
UIBC: 183 ug/dL

## 2024-09-13 LAB — D-DIMER, QUANTITATIVE: D-Dimer, Quant: 0.45 ug{FEU}/mL (ref 0.00–0.50)

## 2024-09-13 LAB — FERRITIN: Ferritin: 514 ng/mL — ABNORMAL HIGH (ref 24–336)

## 2024-09-13 LAB — VITAMIN B12: Vitamin B-12: 1246 pg/mL — ABNORMAL HIGH (ref 180–914)

## 2024-09-13 MED ORDER — EPOETIN ALFA-EPBX 20000 UNIT/ML IJ SOLN
20000.0000 [IU] | Freq: Once | INTRAMUSCULAR | Status: AC
  Administered 2024-09-13: 20000 [IU] via SUBCUTANEOUS
  Filled 2024-09-13: qty 1

## 2024-09-13 NOTE — Patient Instructions (Signed)
 [facility] [place] - A DEPT OF [facility]  Discharge Instructions: Thank you for choosing [facility] to provide your oncology and hematology care.  If you have a lab appointment with the Cancer Center - please note that after April 8th, 2024, all labs will be drawn in the cancer center.  You do not have to check in or register with the main entrance as you have in the past but will complete your check-in in the cancer center.  Wear comfortable clothing and clothing appropriate for easy access to any Portacath or PICC line.   We strive to give you quality time with your provider. You may need to reschedule your appointment if you arrive late (15 or more minutes).  Arriving late affects you and other patients whose appointments are after yours.  Also, if you miss three or more appointments without notifying the office, you may be dismissed from the clinic at the provider's discretion.      For prescription refill requests, have your pharmacy contact our office and allow 72 hours for refills to be completed.    Today you received Retacrit  20,000 units       BELOW ARE SYMPTOMS THAT SHOULD BE REPORTED IMMEDIATELY: *FEVER GREATER THAN 100.4 F (38 C) OR HIGHER *CHILLS OR SWEATING *NAUSEA AND VOMITING THAT IS NOT CONTROLLED WITH YOUR NAUSEA MEDICATION *UNUSUAL SHORTNESS OF BREATH *UNUSUAL BRUISING OR BLEEDING *URINARY PROBLEMS (pain or burning when urinating, or frequent urination) *BOWEL PROBLEMS (unusual diarrhea, constipation, pain near the anus) TENDERNESS IN MOUTH AND THROAT WITH OR WITHOUT PRESENCE OF ULCERS (sore throat, sores in mouth, or a toothache) UNUSUAL RASH, SWELLING OR PAIN  UNUSUAL VAGINAL DISCHARGE OR ITCHING   Items with * indicate a potential emergency and should be followed up as soon as possible or go to the Emergency Department if any problems should occur.  Please show the CHEMOTHERAPY ALERT CARD or IMMUNOTHERAPY ALERT CARD at  check-in to the Emergency Department and triage nurse.  Should you have questions after your visit or need to cancel or reschedule your appointment, please contact [facility] [place] - A DEPT OF [facility] [phone]  and follow the prompts.  Office hours are 8:00 a.m. to 4:30 p.m. Monday - Friday. Please note that voicemails left after 4:00 p.m. may not be returned until the following business day.  We are closed weekends and major holidays. You have access to a nurse at all times for urgent questions. Please call the main number to the clinic [phone] and follow the prompts.  For any non-urgent questions, you may also contact your provider using MyChart. We now offer e-Visits for anyone 74 and older to request care online for non-urgent symptoms. For details visit [email or web address].   Also download the MyChart app! Go to the app store, search MyChart, open the app, select [place], and log in with your MyChart username and password.

## 2024-09-13 NOTE — Progress Notes (Signed)
 Michael Harmon presents today for injection per the provider's orders.  Retacrit  20,000U administration without incident; injection site WNL; see MAR for injection details.  Patient tolerated procedure well and without incident.  No questions or complaints noted at this time. Patient's hemoglobin noted to be 10.6 today.   Discharged from clinic ambulatory in stable condition. Alert and oriented x 3. F/U with [facility] as scheduled.

## 2024-09-13 NOTE — Progress Notes (Signed)
 Patient made aware and states he will reach out to his coordinator today for recommendations.

## 2024-09-15 LAB — CARDIOLIPIN ANTIBODIES, IGG, IGM, IGA
Anticardiolipin IgA: <9 U/mL (ref 0–11)
Anticardiolipin IgG: <9 GPL U/mL (ref 0–14)
Anticardiolipin IgM: <9 [MPL'U]/mL (ref 0–12)

## 2024-09-15 LAB — HEXAGONAL PHASE PHOSPHOLIPID: Hexagonal Phase Phospholipid: 5 s (ref 0–11)

## 2024-09-15 LAB — BETA-2-GLYCOPROTEIN I ABS, IGG/M/A
Beta-2 Glyco I IgG: <9 GPI IgG units (ref 0–20)
Beta-2-Glycoprotein I IgA: <9 GPI IgA units (ref 0–25)
Beta-2-Glycoprotein I IgM: <9 GPI IgM units (ref 0–32)

## 2024-09-15 LAB — LUPUS ANTICOAGULANT PANEL
DRVVT: 90.6 s — ABNORMAL HIGH (ref 0.0–47.0)
PTT Lupus Anticoagulant: 45.7 s — ABNORMAL HIGH (ref 0.0–43.5)

## 2024-09-15 LAB — DRVVT MIX: dRVVT Mix: 62.8 s — ABNORMAL HIGH (ref 0.0–40.4)

## 2024-09-15 LAB — DRVVT CONFIRM: dRVVT Confirm: 0.9 ratio (ref 0.8–1.2)

## 2024-09-15 LAB — PTT-LA MIX: PTT-LA Mix: 41.1 s — ABNORMAL HIGH (ref 0.0–40.5)

## 2024-09-18 LAB — FACTOR 5 LEIDEN

## 2024-09-19 ENCOUNTER — Ambulatory Visit (INDEPENDENT_AMBULATORY_CARE_PROVIDER_SITE_OTHER): Admitting: Internal Medicine

## 2024-09-19 ENCOUNTER — Encounter: Payer: Self-pay | Admitting: Internal Medicine

## 2024-09-19 VITALS — BP 154/74 | HR 73 | Ht 67.0 in | Wt 200.8 lb

## 2024-09-19 DIAGNOSIS — I10 Essential (primary) hypertension: Secondary | ICD-10-CM | POA: Diagnosis not present

## 2024-09-19 DIAGNOSIS — K900 Celiac disease: Secondary | ICD-10-CM

## 2024-09-19 DIAGNOSIS — Z0001 Encounter for general adult medical examination with abnormal findings: Secondary | ICD-10-CM | POA: Diagnosis not present

## 2024-09-19 DIAGNOSIS — Z794 Long term (current) use of insulin: Secondary | ICD-10-CM

## 2024-09-19 DIAGNOSIS — C61 Malignant neoplasm of prostate: Secondary | ICD-10-CM

## 2024-09-19 DIAGNOSIS — N184 Chronic kidney disease, stage 4 (severe): Secondary | ICD-10-CM

## 2024-09-19 DIAGNOSIS — L84 Corns and callosities: Secondary | ICD-10-CM | POA: Insufficient documentation

## 2024-09-19 DIAGNOSIS — Z5309 Procedure and treatment not carried out because of other contraindication: Secondary | ICD-10-CM

## 2024-09-19 DIAGNOSIS — E1169 Type 2 diabetes mellitus with other specified complication: Secondary | ICD-10-CM

## 2024-09-19 DIAGNOSIS — E039 Hypothyroidism, unspecified: Secondary | ICD-10-CM

## 2024-09-19 DIAGNOSIS — E8809 Other disorders of plasma-protein metabolism, not elsewhere classified: Secondary | ICD-10-CM

## 2024-09-19 LAB — PROTHROMBIN GENE MUTATION

## 2024-09-19 LAB — METHYLMALONIC ACID, SERUM: Methylmalonic Acid, Quantitative: 267 nmol/L (ref 0–378)

## 2024-09-19 MED ORDER — CARVEDILOL 3.125 MG PO TABS: 3.1250 mg | ORAL_TABLET | Freq: Two times a day (BID) | ORAL | 3 refills | Status: DC

## 2024-09-19 NOTE — Assessment & Plan Note (Signed)
 Lab Results  Component Value Date   HGBA1C 5.2 07/05/2024   Well-controlled  Associated with HLD On Tresiba 10 U QD, followed by Endocrinology in [facility] endocrinology Advised to follow diabetic diet Not on statin due to history of liver cirrhosis Recent CMP reviewed from chart Diabetic eye exam: Advised to follow up with Ophthalmology for diabetic eye exam

## 2024-09-19 NOTE — Assessment & Plan Note (Addendum)
 BP Readings from Last 1 Encounters:  09/19/24 (!) 154/74   Uncontrolled Started carvedilol  3.125 mg twice daily - will follow up with Nephrology for adjustment of dose Did not tolerate amlodipine, unable to start ACEi/ARB or diuretic due to CKD stage 4 currently Advised to follow low-salt diet and ambulate as tolerated

## 2024-09-19 NOTE — Assessment & Plan Note (Signed)
Symptomatic despite trying to follow gluten free diet Followed by GI

## 2024-09-19 NOTE — Patient Instructions (Signed)
 Please start taking Carvedilol  3.125 mg twice daily.  Please continue to take other medications as prescribed.  Please continue to follow low carb diet and ambulate as tolerated.

## 2024-09-19 NOTE — Assessment & Plan Note (Signed)
Completed radiotherapy for prostate cancer.  He is followed by oncology and urology currently.  He denies any hematuria, dysuria or urinary hesitancy or resistance currently.

## 2024-09-19 NOTE — Assessment & Plan Note (Signed)
 Lab Results  Component Value Date   TSH 4.656 (H) 07/05/2024   Followed by [facility] endocrinology Continue levothyroxine 50 mcg QD

## 2024-09-19 NOTE — Assessment & Plan Note (Addendum)
 Physical exam as documented. Fasting blood tests reviewed from chart. 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

## 2024-09-19 NOTE — Assessment & Plan Note (Signed)
 Has chronic leg swelling due to hypoalbuminemia, history of liver cirrhosis Unable to take regular protein supplement due to hyperkalemia-recently had hospitalization for hyperkalemia Has been advised to take Nepro supplement, but does not tolerate it, has nausea from it Continue high protein diet

## 2024-09-19 NOTE — Progress Notes (Signed)
 Established Patient Office Visit  Subjective:  Patient ID: Michael Harmon, male    DOB: 1960/11/25  Age: 63 y.o. MRN: [id number]  CC:  Chief Complaint  Patient presents with   Annual Exam    CPE     HPI [name] is a 63 y.o. male with past medical history of HTN, hepatic cirrhosis, portal hypertension, esophageal varices, celiac disease, type II DM, HLD, thrombocytopenia and IDA who presents for annual physical.  Celiac disease, hepatic cirrhosis: He had liver transplant on 01/16/2023.  He is on tacrolimus  currently.  Followed by [facility] transplant team and nephrology.  He is followed by [facility] and [facility] liver clinic.  He has history of esophageal varices as well.  He used to get surveillance EGD locally, but has to get it at [facility], [facility], [facility] now due to his thrombocytopenia. EGD has shown esophageal varices in the past, for which he gets surveillance EGD. He denies any melena or hematochezia currently. He has chronic leg swelling in the setting of hypoalbuminemia.  He has completed radiotherapy for prostate cancer.  He is followed by oncology and urology currently.  He denies any hematuria, dysuria or urinary hesitancy or resistance currently.   Type 2 DM: His HbA1c was 5.2 in 09/25. He is taking Tresiba 10 U qHS. He has CGM, blood glucose averages are around 140. He did not tolerate metformin  in the past, had diarrhea with that.  He attributes his celiac disease symptom onset to metformin .  Denies any polyuria or polydipsia currently.  He used to take Lipitor for HLD, but was stopped due to hepatic cirrhosis.  CKD: He follows up with [facility] nephrology, and is in the transplant list as well.  Last calculated GFR by Cystatin C was 20.  He recently saw transplant clinic at [facility] as well.  HTN: His BP has been elevated for the last few months.  He was placed on amlodipine by nephrology, but had leg pain with it.  Denies any chest pain, dyspnea or palpitations currently.    Past Medical History:   Diagnosis Date   Allergy    nose and sinus problems   Anemia    Anemia in chronic kidney disease (CKD) 11/23/2022   Asthma    Celiac disease    Cirrhosis, cryptogenic (HCC)    completed Hep A and B vaccines   DM (diabetes mellitus) (HCC)    Encounter for general adult medical examination with abnormal findings 09/08/2022   GERD (gastroesophageal reflux disease)    Hyperlipidemia    diet controlled now with 100 pound weight loss   Hypertension    Hypothyroidism 03/17/2024   Iron  deficiency anemia due to chronic blood loss 08/13/2021   Malignant neoplasm of prostate (HCC) 10/03/2021   Murmur, heart 03/02/2022   Narrow angle glaucoma suspect of both eyes    Renal cyst 09/03/2017   right   Situational depression 07/06/2017   Splenomegaly    Thrombocytopenia    hematology, ?related to chol med (tricor) and glimeripide, just being monitored, above 100,000    Past Surgical History:  Procedure Laterality Date   BIOPSY  07/09/2016   Procedure: BIOPSY;  Surgeon: Lamar CHRISTELLA Hollingshead, MD;  [facility];  Service: Endoscopy;;  duodenal, gastric, esophaageal   BIOPSY  07/31/2021   Procedure: BIOPSY;  Surgeon: Hollingshead Lamar CHRISTELLA, MD;  [facility];  Service: Endoscopy;;   CLOSED MANIPULATION SHOULDER     COLONOSCOPY  11/2015   Dr. Tobie: cecal bx neg  for microscopic colitis. ascending colon polyp (adenomatous)   COLONOSCOPY WITH PROPOFOL  N/A 07/31/2021   Procedure: COLONOSCOPY WITH PROPOFOL ;  Surgeon: Shaaron Lamar HERO, MD;  [facility];  Service: Endoscopy;  Laterality: N/A;  7:30am   ESOPHAGEAL BANDING N/A 08/04/2017   Procedure: ESOPHAGEAL BANDING;  Surgeon: Shaaron Lamar HERO, MD;  [facility];  Service: Endoscopy;  Laterality: N/A;  esophageal varices banding   ESOPHAGEAL BANDING N/A 10/13/2017   Procedure: ESOPHAGEAL BANDING;  Surgeon: Shaaron Lamar HERO, MD;  [facility];  Service: Endoscopy;  Laterality: N/A;   ESOPHAGEAL BANDING N/A 04/09/2020    Procedure: ESOPHAGEAL BANDING;  Surgeon: Shaaron Lamar HERO, MD;  [facility];  Service: Endoscopy;  Laterality: N/A;   ESOPHAGOGASTRODUODENOSCOPY N/A 07/09/2016   Procedure: ESOPHAGOGASTRODUODENOSCOPY (EGD);  Surgeon: Lamar HERO Shaaron, MD;  [facility];  Service: Endoscopy;  Laterality: N/A;  730   ESOPHAGOGASTRODUODENOSCOPY N/A 08/04/2017   Procedure: ESOPHAGOGASTRODUODENOSCOPY (EGD);  Surgeon: Shaaron Lamar HERO, MD;  [facility];  Service: Endoscopy;  Laterality: N/A;  7:30am   ESOPHAGOGASTRODUODENOSCOPY N/A 10/13/2017   Dr. Shaaron: grade 2 esophageal varices status post banding, portal hypertensive gastropathy   ESOPHAGOGASTRODUODENOSCOPY  11/23/2019   [name]: Esophageal varices, 4 columns of grade 2-3 status post banding.  Varices more prominent than seen in December 2018.  Portal gastropathy.  Normal-appearing small bowel.   ESOPHAGOGASTRODUODENOSCOPY (EGD) WITH PROPOFOL  N/A 04/09/2020   Dr. Shaaron: 4 columns of grade 2/grade 3 esophageal varices somewhat recalcitrant.  Status post esophageal band ligation today.  Portal gastropathy.  Normal-appearing small bowel.   ESOPHAGOGASTRODUODENOSCOPY (EGD) WITH PROPOFOL  N/A 07/31/2021   Procedure: ESOPHAGOGASTRODUODENOSCOPY (EGD) WITH PROPOFOL ;  Surgeon: Shaaron Lamar HERO, MD;  [facility];  Service: Endoscopy;  Laterality: N/A;   GOLD SEED IMPLANT N/A 01/15/2022   Procedure: GOLD SEED IMPLANT;  Surgeon: Sherrilee Belvie CROME, MD;  Location: [facility];  Service: Urology;  Laterality: N/A;   LIVER TRANSPLANT  01/16/2023   SPACE OAR INSTILLATION N/A 01/15/2022   Procedure: SPACE OAR INSTILLATION;  Surgeon: Sherrilee Belvie CROME, MD;  Location: [facility];  Service: Urology;  Laterality: N/A;   US  PARACENTESIS  06/10/2022    Family History  Problem Relation Age of Onset   Other Mother        chronic diarrhea   COPD Mother    Heart disease Mother        died at 33   Arthritis Mother    Cancer Mother    Depression Mother     Diabetes Mother    Hyperlipidemia Mother    Hypertension Mother    Stroke Mother    Arthritis Father    Asthma Father    Birth defects Father    Heart disease Father        aortic valve replaced   Heart disease Maternal Grandmother    Stroke Maternal Grandmother    Heart disease Maternal Grandfather    Stroke Maternal Grandfather    Diabetes Paternal Grandmother    Stroke Paternal Grandfather    Heart disease Paternal Grandfather    Liver disease Neg Hx    Colon cancer Neg Hx     Social History   Socioeconomic History   Marital status: Married    Spouse name: Melissa   Number of children: 0   Years of education: 14   Highest education level: Associate degree: occupational, scientist, product/process development, or vocational program  Occupational History   Occupation: lexicographer, 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   Has no children.   Eats all food groups.   Works for [employer]       Social Drivers of Corporate Investment Banker Strain: Low Risk  (09/15/2024)   Overall Financial Resource Strain (CARDIA)    Difficulty of Paying Living Expenses: Not hard at all  Food Insecurity: No Food Insecurity (09/15/2024)   Hunger Vital Sign    Worried About Running Out of Food in the Last Year: Never true    Ran Out of Food in the Last Year: Never true  Transportation Needs: No Transportation Needs (09/15/2024)   PRAPARE - Administrator, Civil Service (Medical): No    Lack of Transportation (Non-Medical): No  Physical Activity: Insufficiently Active (09/15/2024)   Exercise Vital Sign    Days of Exercise per Week: 2 days     Minutes of Exercise per Session: 40 min  Stress: No Stress Concern Present (09/15/2024)   [employer] of Occupational Health - Occupational Stress Questionnaire    Feeling of Stress: Only a little  Social Connections: Socially Integrated (09/15/2024)   Social Connection and Isolation Panel    Frequency of Communication with Friends and Family: More than three times a week    Frequency of Social Gatherings with Friends and Family: Twice a week    Attends Religious Services: More than 4 times per year    Active Member of [employer] or Organizations: Yes    Attends Engineer, Structural: More than 4 times per year    Marital Status: Married  Catering Manager Violence: Patient Unable To Answer (03/08/2024)   Humiliation, Afraid, Rape, and Kick questionnaire    Fear of Current or Ex-Partner: Patient unable to answer    Emotionally Abused: Patient unable to answer    Physically Abused: Patient unable to answer    Sexually Abused: Patient unable to answer    Outpatient Medications Prior to Visit  Medication Sig Dispense Refill   acetaminophen  (TYLENOL ) 325 MG tablet Take 650 mg by mouth every 8 (eight) hours as needed.     albuterol (VENTOLIN HFA) 108 (90 Base) MCG/ACT inhaler Inhale 2 puffs into the lungs every 6 (six) hours as needed for wheezing or shortness of breath.     ammonium lactate (LAC-HYDRIN) 12 % lotion Apply 1 Application topically daily.     apixaban (ELIQUIS) 5 MG TABS tablet Take 5 mg by mouth 2 (two) times daily.     aspirin  EC 81 MG tablet Take by mouth.     Calcium  Citrate-Vitamin D  (CALCIUM  CITRATE + D3 PO) Take 2 tablets by mouth 2 (two) times daily.     Continuous Glucose Sensor (FREESTYLE LIBRE 2 SENSOR) MISC See admin instructions.     Copper  Gluconate 2 MG TABS Take 2 mg by mouth daily.     cyanocobalamin  (VITAMIN B12) 1000 MCG tablet Take 1 tablet (1,000 mcg total) by mouth daily. 90 tablet 3   diphenoxylate-atropine (LOMOTIL) 2.5-0.025 MG tablet Take 1  tablet by mouth 3 (three) times daily.     insulin   degludec (TRESIBA) 100 UNIT/ML FlexTouch Pen Inject 10 Units into the skin.     levothyroxine (SYNTHROID) 50 MCG tablet Take 50 mcg by mouth daily before breakfast.     Lido-Capsaicin-Men-Methyl Sal (MEDI-PATCH-LIDOCAINE  EX) Apply 1 patch topically as directed.     loperamide (IMODIUM) 2 MG capsule Take 2 mg by mouth.     MAGNESIUM-OXIDE PO Take 266 mg by mouth.     Multiple Vitamin (MULTIVITAMIN WITH MINERALS) TABS tablet Take 1 tablet by mouth daily.     NON FORMULARY Tube feedings from 6pm-8am  via tube feeding pump.     ondansetron  (ZOFRAN -ODT) 4 MG disintegrating tablet Take 4 mg by mouth every 8 (eight) hours as needed.     Pancrelipase, Lip-Prot-Amyl, 25000-79000 units CPEP Take 2 capsules by mouth.     pantoprazole  (PROTONIX ) 40 MG tablet Take 40 mg by mouth 2 (two) times daily.     sodium zirconium cyclosilicate  (LOKELMA ) 10 g PACK packet Take 10 g by mouth daily.     tacrolimus  (PROGRAF ) 1 MG capsule Take 2 mg by mouth 2 (two) times daily.     tamsulosin  (FLOMAX ) 0.4 MG CAPS capsule Take 0.4 mg by mouth daily.     UNABLE TO FIND Med Name: Magnesium oxide-magnesium amino acid chelate 266 mg BID     VITAMIN D -1000 MAX ST 25 MCG (1000 UT) tablet Take 1,000 Units by mouth.     MELATONIN ER PO Take 9 mg by mouth at bedtime.     Melatonin-Pyridoxine (MELATIN PO) Take 9 mg by mouth at bedtime.     pyridoxine (B-6) 100 MG tablet Take 100 mg by mouth daily.     zinc sulfate, 50mg  elemental zinc, 220 (50 Zn) MG capsule Take 220 mg by mouth.     No facility-administered medications prior to visit.    Allergies  Allergen Reactions   Penicillins Other (See Comments)    As a baby, unknown reaction Did it involve swelling of the face/tongue/throat, SOB, or low BP? Unknown Did it involve sudden or severe rash/hives, skin peeling, or any reaction on the inside of your mouth or nose? Unknown Did you need to seek medical attention at a hospital  or doctor's office? Unknown When did it last happen?      infant allergy If all above answers are "NO", may proceed with cephalosporin use. .    Amlodipine Swelling   Gluten Meal Diarrhea   Biaxin [Clarithromycin] Tinitus    Ears ringing, loss sense of smell    ROS Review of Systems  Constitutional:  Positive for fatigue. Negative for chills and fever.  HENT:  Negative for congestion and sore throat.   Eyes:  Negative for pain and discharge.  Respiratory:  Negative for cough and shortness of breath.   Cardiovascular:  Positive for leg swelling. Negative for chest pain and palpitations.  Gastrointestinal:  Positive for diarrhea. Negative for nausea and vomiting.  Endocrine: Negative for polydipsia and polyuria.  Genitourinary:  Negative for dysuria and hematuria.  Musculoskeletal:  Negative for neck pain and neck stiffness.  Skin:  Negative for rash.  Neurological:  Negative for dizziness, weakness, numbness and headaches.  Psychiatric/Behavioral:  Positive for sleep disturbance. Negative for agitation and behavioral problems.       Objective:    Physical Exam Vitals reviewed.  Constitutional:      General: He is not in acute distress.    Appearance: He is not diaphoretic.  HENT:     Head: Normocephalic and atraumatic.  Nose: Nose normal.     Mouth/Throat:     Mouth: Mucous membranes are moist.  Eyes:     General: No scleral icterus.    Extraocular Movements: Extraocular movements intact.  Cardiovascular:     Rate and Rhythm: Normal rate and regular rhythm.     Heart sounds: Normal heart sounds. No murmur heard. Pulmonary:     Breath sounds: Normal breath sounds. No wheezing or rales.  Abdominal:     Palpations: Abdomen is soft.     Tenderness: There is no abdominal tenderness.  Musculoskeletal:     Cervical back: Neck supple. No tenderness.     Right lower leg: Edema (1+) present.     Left lower leg: Edema (2+) present.  Feet:     Right foot:     Skin  integrity: Callus present.     Left foot:     Skin integrity: Callus present.  Skin:    General: Skin is warm.     Findings: No rash.  Neurological:     General: No focal deficit present.     Mental Status: He is alert and oriented to person, place, and time.     Cranial Nerves: No cranial nerve deficit.     Sensory: No sensory deficit.     Motor: Weakness (B/l LE - 4/5) present.  Psychiatric:        Mood and Affect: Mood normal.        Behavior: Behavior normal.     BP (!) 154/74 (BP Location: Left Arm)   Pulse 73   Ht 5' 7 (1.702 m)   Wt 200 lb 12.8 oz (91.1 kg)   SpO2 98%   BMI 31.45 kg/m  Wt Readings from Last 3 Encounters:  09/19/24 200 lb 12.8 oz (91.1 kg)  07/26/24 196 lb (88.9 kg)  07/13/24 194 lb 1.3 oz (88 kg)    Lab Results  Component Value Date   TSH 4.656 (H) 07/05/2024   Lab Results  Component Value Date   WBC 3.1 (L) 09/13/2024   HGB 10.6 (L) 09/13/2024   HCT 32.9 (L) 09/13/2024   MCV 97.6 09/13/2024   PLT 119 (L) 09/13/2024   Lab Results  Component Value Date   NA 142 09/13/2024   K 4.7 09/13/2024   CO2 24 09/13/2024   GLUCOSE 149 (H) 09/13/2024   BUN 41 (H) 09/13/2024   CREATININE 2.32 (H) 09/13/2024   BILITOT 0.4 09/13/2024   ALKPHOS 117 09/13/2024   AST 21 09/13/2024   ALT 14 09/13/2024   PROT 6.0 (L) 09/13/2024   ALBUMIN  3.4 (L) 09/13/2024   CALCIUM  8.3 (L) 09/13/2024   ANIONGAP 9 09/13/2024   Lab Results  Component Value Date   CHOL 114 07/05/2024   Lab Results  Component Value Date   HDL 39 (L) 07/05/2024   Lab Results  Component Value Date   LDLCALC 56 07/05/2024   Lab Results  Component Value Date   TRIG 96 07/05/2024   Lab Results  Component Value Date   CHOLHDL 2.9 07/05/2024   Lab Results  Component Value Date   HGBA1C 5.2 07/05/2024      Assessment & Plan:   Problem List Items Addressed This Visit       Cardiovascular and Mediastinum   Essential hypertension   BP Readings from Last 1 Encounters:   09/19/24 (!) 154/74   Uncontrolled Started carvedilol  3.125 mg twice daily - will follow up with Nephrology for adjustment of dose Did  not tolerate amlodipine, unable to start ACEi/ARB or diuretic due to CKD stage 4 currently Advised to follow low-salt diet and ambulate as tolerated      Relevant Medications   carvedilol  (COREG ) 3.125 MG tablet     Digestive   Adult form of celiac disease   Symptomatic despite trying to follow gluten free diet Followed by GI        Endocrine   Type 2 diabetes mellitus with other specified complication [facility])   Lab Results  Component Value Date   HGBA1C 5.2 07/05/2024   Well-controlled  Associated with HLD On Tresiba 10 U QD, followed by Endocrinology in [facility] endocrinology Advised to follow diabetic diet Not on statin due to history of liver cirrhosis Recent CMP reviewed from chart Diabetic eye exam: Advised to follow up with Ophthalmology for diabetic eye exam      Hypothyroidism   Lab Results  Component Value Date   TSH 4.656 (H) 07/05/2024   Followed by [facility] endocrinology Continue levothyroxine 50 mcg QD      Relevant Medications   carvedilol  (COREG ) 3.125 MG tablet     Musculoskeletal and Integument   Callus of foot   Has callus on bilateral feet Advised to contact podiatry in [place] - has seen them before        Genitourinary   Malignant neoplasm of prostate [facility])   Completed radiotherapy for prostate cancer.  He is followed by oncology and urology currently.  He denies any hematuria, dysuria or urinary hesitancy or resistance currently.      Chronic kidney disease, stage 4 (severe) (HCC)   Last CMP showed GFR of 20 (calculated with Cystatin C) - in Care Everywhere Followed by Nephrology at [facility] -undergoing evaluation for renal transplant Avoid nephrotoxic agents        Other   Encounter for general adult medical examination with abnormal findings - Primary   Physical exam as documented. Fasting blood  tests reviewed from chart. 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      Statins contraindicated   Due to h/o liver cirrhosis      Edema due to hypoalbuminemia   Has chronic leg swelling due to hypoalbuminemia, history of liver cirrhosis Unable to take regular protein supplement due to hyperkalemia-recently had hospitalization for hyperkalemia Has been advised to take Nepro supplement, but does not tolerate it, has nausea from it Continue high protein diet       Meds ordered this encounter  Medications   carvedilol  (COREG ) 3.125 MG tablet    Sig: Take 1 tablet (3.125 mg total) by mouth 2 (two) times daily with a meal.    Dispense:  60 tablet    Refill:  3    Follow-up: Return in about 6 months (around 03/19/2025) for HTN.    Suzzane MARLA Blanch, MD

## 2024-09-19 NOTE — Assessment & Plan Note (Signed)
 Due to h/o liver cirrhosis

## 2024-09-19 NOTE — Assessment & Plan Note (Signed)
 Has callus on bilateral feet Advised to contact podiatry in [place] - has seen them before

## 2024-09-19 NOTE — Assessment & Plan Note (Signed)
 Last CMP showed GFR of 20 (calculated with Cystatin C) - in Care Everywhere Followed by Nephrology at [facility] -undergoing evaluation for renal transplant Avoid nephrotoxic agents

## 2024-09-26 NOTE — Progress Notes (Unsigned)
 [facility] 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 63 y.o. 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   He was last seen by Pleasant Barefoot PA-C on 07/05/2024. Received Venofer  300 mg x 1 on 07/26/2024 at the request of his nephrologist for functional iron  deficiency.  He has not had any more hospitalizations since last visit, most recently was hospitalized at [facility] in April/May 2025.*** He continues to follow with various subspecialists at [facility], although he reports that he is no longer being considered as a candidate for renal transplant.***  At today's visit, he reports feeling ***. He has little to no*** energy and low*** appetite. He remains on tube feedings at this time, but decreased amounts.  He is eating three meals now, and is hoping to have his feeding tube removed soon. ***He has regained the weight he lost during his hospitalizations earlier this year.  ***He continues to bruise easily, but denies any obvious bleeding such as epistaxis, hematochezia, melena, or hematuria.  ***He is on Eliquis twice daily for DVT/PE. ***He denies any masses or lymphadenopathy. ***He was treated with antibiotics for bronchitis in June 2025,  has not had any other recent infections.    ***He denies any B symptoms.     ***He is taking vitamin B12 1000 mcg every other day.  His mobility is increasing with physical therapy, reports that he spends < 50% of the day in his chair/bed.  *** ***He  has gone back to work, which involves driving several hours. ***He has been more active doing things around the house.  He is tolerating Retacrit  fairly well.  ***Blood pressure today is within target range.    ASSESSMENT & PLAN:  1.  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 - CT scan (11/14/2021 at [facility]) showed enlarged spleen measuring 17 cm - Work-up negative for other causes of thrombocytopenia - normal B12, methylmalonic acid, folate, copper ; negative rheumatoid factor/ANA; no abnormal platelets noted on pathology smear review - Received platelet transfusion x1 on 08/18/2021 prior to prostate biopsy.  Blood transfusion x1 on 01/15/2022 prior to prostate SpaceOAR placement is - Bone marrow biopsy (02/10/2022) shows hypercellular marrow for age with trilineage lineage hematopoiesis.  No increase in blasts.  No evidence of metastatic carcinoma. - Liver transplant at [facility] on 01/16/2023, on Myfortic  which may have been contributing to his neutropenia (dose reduced on 05/31/2023 due to neutropenia) - CBC today (***): Platelets ***.  WBC ***Michael *** - DIFFERENTIAL DIAGNOSIS: Thrombocytopenia and leukopenia likely secondary to cirrhosis (s/p liver transplant 01/16/2023), medication (tacrolimus )  and splenic sequestration.  Unclear if patient has any underlying ITP in addition to this. Discussed with patient that spleen  size may not fully returned to normal after liver transplantation, and therefore platelet count may or may not fully return to normal - PLAN:  Platelets are improved following liver transplantation.  WBCs remain  low.  No indication for treatment at this time.  We will continue to monitor with periodic CBCs.   2.  Normocytic anemia, secondary to CKD + iron  deficiency  - Bone marrow biopsy (02/10/2022) shows hypercellular marrow for age with trilineage lineage hematopoiesis.  No increase in blasts.  No evidence of metastatic carcinoma. - EGDs at [facility] in April 2023 in October 2023 showed esophageal varices (s/p banding), old blood in the stomach, and portal hypertensive gastropathy  - Other work-up (08/18/2021) showed normal reticulocytes; normal B12, methylmalonic acid, folate, and copper .  Immunofixation and SPEP negative.  (Homocystine elevation due to cirrhosis, no improvement after folic acid  supplementation) - Patient reports that he has been diagnosed with celiac disease by Dr. Shaaron - Most recent IV iron  07/26/2024 with Venofer  300 mg x 1, at the request of his nephrologist due to functional iron  deficiency - Retacrit  10,000 units every 2 weeks started 10/22/2022 (interrupted due to liver transplant 01/16/2023).  Retacrit  restarted on 04/14/2023.  Current dose is 20,000 units every 2 weeks. - Intermittent blood transfusions, most recently PRBC x 3 during hospitalization at [facility] in April/May 2025.  No bloody bowel movements noted during hospital stay, but patient had right buttock hematoma from fall at SNF, with CTAP with contrast showing right gluteal hematoma with no active extravasation. - Per discharge summary from [facility] (03/06/2024), recent EGD, capsule endoscopy, and colonoscopy were without active signs of bleeding. - G-tube placed on 02/03/2024, remains on tube feedings for malnutrition and failure to thrive*** - Most recent labs (06/20/2024):  Ferritin 514, iron  saturation 24% Creatinine 2.32/GFR 31. Vitamin B12 elevated at 1246, MMA normal 267 - CBC today (***):*** - Started on Eliquis 02/07/2024 due to DVT/PE.   - Denies any rectal bleeding or melena.*** - Reports severe fatigue related to other  comorbidities.  *** No pica, chest pain, or difficulty breathing. - DIFFERENTIAL DIAGNOSIS: Anemia secondary to CKD, further complicated by malabsorption (celiac disease), intermittent GI bleeding - PLAN: *** TBD.  *** Continue Retacrit  20,000 units every 2 week - Patient can DISCONTINUE vitamin B12 supplement for now.  Will recheck at follow-up to see if he needs to restart at lower dose (such as 500 mcg EOD).*** - Labs (CBC/D, CMP, ferritin, iron /TIBC, B12, MMA) in 3 months with OFFICE visit 1 week after labs  3.  Pulmonary embolism and DVT - Diagnosed at [facility], presented with hypoxia on 01/28/2024 - CT (02/05/2024) showed right lower lobe pulmonary embolus, extending from right pulmonary artery into segmental and subsegmental branches - Bilateral lower extremity US  (02/06/2024): Nonocclusive DVT within right popliteal vein - Started on Eliquis as of 02/07/2024.  No bleeding issues to date.*** - Likely provoked DVT/PE due to multiple hospitalizations, SNF stay, and severely decreased mobility in the immediate time period preceding DVT/PE - Hypercoagulable workup (09/13/2024) was unremarkable.  (No factor V Leiden or PT gene mutation detected.  Negative beta-2  glycoprotein antibodies, cardiolipin antibodies, and lupus anticoagulant panel.) - D-dimer from 09/13/2024 is normal at 0.45 - His mobility is increasing with physical therapy, reports that he spends < 50% of the day in his chair/bed.  He has gone back to work, which involves driving several hours. He has been more active doing things around the house.*** - PLAN: *** TBD - possibly discontinue Eliquis?  *** Continue  Eliquis 5 mg twice daily for the foreseeable future.  Recommend at least 6 months of treatment, possibly longer depending on persistent risk factors.  Would recommend checking hypercoagulable panel prior to any discontinuation of anticoagulant. -- We will order coagulopathy panel including repeat D-dimer prior to next visit   4.   Cirrhosis with esophageal varices - S/P LIVER TRANSPLANT 01/16/2023*** - Diagnosed in 2017 - Patient had EGD/colonoscopy on 07/31/2021 which showed portal colopathy as well as gastric erosions and grade 3 varices, which were not banded due to low platelet count - EGD (12/26/2021 via [facility]): Grade 2 esophageal varices, banded x3; old blood noted in stomach; mild portal hypertensive gastropathy - Patient follows with hepatology at [facility] - Liver transplant at [facility] on 01/16/2023 NOTE: Patient's post-transplant nurse coordinator at [facility] is [facility]  [phone])    5.  Stage T1c adenocarcinoma of the prostate, Gleason 3+4  *** - Diagnosed in October 2022 with prostate biopsy after abnormal PSA (8.2) - Following with urologist (Dr. Nieves / Dr. Sherrilee) and has also been seen by radiation oncology (Ashlyn Bruning PA-C / Dr. Donnice Barge) - He had SpaceOAR placement on 01/15/2022 - Patient completed XRT in [place] on 03/13/2022. - He is receiving Lupron  shots and following with urology and radiation oncology  PLAN SUMMARY: ***TBD*** >> CBC + Retacrit  every 2 weeks (**attention to WBC trend**) >> Labs in 3 months = CBC/D, CMP, ferritin, iron /TIBC, B12, MMA, D-dimer, factor V Leiden, PT gene mutation, beta-2  glycoprotein antibodies, cardiolipin antibodies, lupus anticoagulant panel  >> OFFICE visit in 3 months (2 weeks after full lab panel) + CBC/Injection     REVIEW OF SYSTEMS: ***  Review of Systems  Constitutional:  Positive for fatigue. Negative for appetite change, chills, diaphoresis, fever and unexpected weight change.  HENT:   Negative for lump/mass and nosebleeds.   Eyes:  Negative for eye problems.  Respiratory:  Negative for cough, hemoptysis and shortness of breath.   Cardiovascular:  Negative for chest pain, leg swelling and palpitations.  Gastrointestinal:  Negative for abdominal pain, blood in stool, constipation, diarrhea, nausea and vomiting.  Genitourinary:  Positive for  frequency. Negative for hematuria.   Musculoskeletal:  Positive for arthralgias.  Skin: Negative.   Neurological:  Positive for dizziness and numbness. Negative for headaches and light-headedness.  Hematological:  Does not bruise/bleed easily.  Psychiatric/Behavioral:  Positive for sleep disturbance.     PHYSICAL EXAM:***  ECOG PERFORMANCE STATUS: 2 - Symptomatic, <50% confined to bed  There were no vitals filed for this visit.   There were no vitals filed for this visit.   Physical Exam Constitutional:      Appearance: Normal appearance. He is obese.  Cardiovascular:     Rate and Rhythm: Normal rate.     Heart sounds: Normal heart sounds.  Pulmonary:     Breath sounds: Normal breath sounds.  Neurological:     General: No focal deficit present.     Mental Status: Mental status is at baseline.  Psychiatric:        Behavior: Behavior normal. Behavior is cooperative.     PAST MEDICAL/SURGICAL HISTORY:  Past Medical History:  Diagnosis Date   Allergy    nose and sinus problems   Anemia    Anemia in chronic kidney disease (CKD) 11/23/2022   Asthma    Celiac disease    Cirrhosis, cryptogenic (HCC)    completed Hep A and B vaccines   DM (diabetes mellitus) (HCC)    Encounter for  general adult medical examination with abnormal findings 09/08/2022   GERD (gastroesophageal reflux disease)    Hyperlipidemia    diet controlled now with 100 pound weight loss   Hypertension    Hypothyroidism 03/17/2024   Iron  deficiency anemia due to chronic blood loss 08/13/2021   Malignant neoplasm of prostate (HCC) 10/03/2021   Murmur, heart 03/02/2022   Narrow angle glaucoma suspect of both eyes    Renal cyst 09/03/2017   right   Situational depression 07/06/2017   Splenomegaly    Thrombocytopenia    hematology, ?related to chol med (tricor) and glimeripide, just being monitored, above 100,000   Past Surgical History:  Procedure Laterality Date   BIOPSY  07/09/2016   Procedure:  BIOPSY;  Surgeon: Lamar CHRISTELLA Hollingshead, MD;  [facility];  Service: Endoscopy;;  duodenal, gastric, esophaageal   BIOPSY  07/31/2021   Procedure: BIOPSY;  Surgeon: Hollingshead Lamar CHRISTELLA, MD;  [facility]: [facility];  Service: Endoscopy;;   CLOSED MANIPULATION SHOULDER     COLONOSCOPY  11/2015   Dr. Tobie: cecal bx neg for microscopic colitis. ascending colon polyp (adenomatous)   COLONOSCOPY WITH PROPOFOL  N/A 07/31/2021   Procedure: COLONOSCOPY WITH PROPOFOL ;  Surgeon: Hollingshead Lamar CHRISTELLA, MD;  [facility];  Service: Endoscopy;  Laterality: N/A;  7:30am   ESOPHAGEAL BANDING N/A 08/04/2017   Procedure: ESOPHAGEAL BANDING;  Surgeon: Hollingshead Lamar CHRISTELLA, MD;  [facility];  Service: Endoscopy;  Laterality: N/A;  esophageal varices banding   ESOPHAGEAL BANDING N/A 10/13/2017   Procedure: ESOPHAGEAL BANDING;  Surgeon: Hollingshead Lamar CHRISTELLA, MD;  [facility];  Service: Endoscopy;  Laterality: N/A;   ESOPHAGEAL BANDING N/A 04/09/2020   Procedure: ESOPHAGEAL BANDING;  Surgeon: Hollingshead Lamar CHRISTELLA, MD;  [facility];  Service: Endoscopy;  Laterality: N/A;   ESOPHAGOGASTRODUODENOSCOPY N/A 07/09/2016   Procedure: ESOPHAGOGASTRODUODENOSCOPY (EGD);  Surgeon: Lamar CHRISTELLA Hollingshead, MD;  [facility];  Service: Endoscopy;  Laterality: N/A;  730   ESOPHAGOGASTRODUODENOSCOPY N/A 08/04/2017   Procedure: ESOPHAGOGASTRODUODENOSCOPY (EGD);  Surgeon: Hollingshead Lamar CHRISTELLA, MD;  [facility];  Service: Endoscopy;  Laterality: N/A;  7:30am   ESOPHAGOGASTRODUODENOSCOPY N/A 10/13/2017   Dr. Hollingshead: grade 2 esophageal varices status post banding, portal hypertensive gastropathy   ESOPHAGOGASTRODUODENOSCOPY  11/23/2019   [name]: Esophageal varices, 4 columns of grade 2-3 status post banding.  Varices more prominent than seen in December 2018.  Portal gastropathy.  Normal-appearing small bowel.   ESOPHAGOGASTRODUODENOSCOPY (EGD) WITH PROPOFOL  N/A 04/09/2020   Dr. Hollingshead: 4 columns of grade 2/grade  3 esophageal varices somewhat recalcitrant.  Status post esophageal band ligation today.  Portal gastropathy.  Normal-appearing small bowel.   ESOPHAGOGASTRODUODENOSCOPY (EGD) WITH PROPOFOL  N/A 07/31/2021   Procedure: ESOPHAGOGASTRODUODENOSCOPY (EGD) WITH PROPOFOL ;  Surgeon: Hollingshead Lamar CHRISTELLA, MD;  [facility];  Service: Endoscopy;  Laterality: N/A;   GOLD SEED IMPLANT N/A 01/15/2022   Procedure: GOLD SEED IMPLANT;  Surgeon: Sherrilee Belvie CROME, MD;  Location: [facility];  Service: Urology;  Laterality: N/A;   LIVER TRANSPLANT  01/16/2023   SPACE OAR INSTILLATION N/A 01/15/2022   Procedure: SPACE OAR INSTILLATION;  Surgeon: Sherrilee Belvie CROME, MD;  Location: [facility];  Service: Urology;  Laterality: N/A;   US  PARACENTESIS  06/10/2022    SOCIAL HISTORY:  Social History   Socioeconomic History   Marital status: Married    Spouse name: Melissa   Number of children: 0   Years of education: 59  Highest education level: Associate degree: occupational, scientist, product/process development, or vocational program  Occupational History   Occupation: lexicographer, 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   Has no children.   Eats all food groups.   Works for [employer]       Social Drivers of Corporate Investment Banker Strain: Low Risk  (09/15/2024)   Overall Financial Resource Strain (CARDIA)    Difficulty of Paying Living Expenses: Not hard at all  Food Insecurity: No Food Insecurity (09/15/2024)   Hunger Vital Sign    Worried About Running Out of Food in the Last Year: Never true    Ran Out of Food in the Last Year: Never true  Transportation Needs: No  Transportation Needs (09/15/2024)   PRAPARE - Administrator, Civil Service (Medical): No    Lack of Transportation (Non-Medical): No  Physical Activity: Insufficiently Active (09/15/2024)   Exercise Vital Sign    Days of Exercise per Week: 2 days    Minutes of Exercise per Session: 40 min  Stress: No Stress Concern Present (09/15/2024)   [employer] of Occupational Health - Occupational Stress Questionnaire    Feeling of Stress: Only a little  Social Connections: Socially Integrated (09/15/2024)   Social Connection and Isolation Panel    Frequency of Communication with Friends and Family: More than three times a week    Frequency of Social Gatherings with Friends and Family: Twice a week    Attends Religious Services: More than 4 times per year    Active Member of [employer] or Organizations: Yes    Attends Engineer, Structural: More than 4 times per year    Marital Status: Married  Catering Manager Violence: Patient Unable To Answer (03/08/2024)   Humiliation, Afraid, Rape, and Kick questionnaire    Fear of Current or Ex-Partner: Patient unable to answer    Emotionally Abused: Patient unable to answer    Physically Abused: Patient unable to answer    Sexually Abused: Patient unable to answer    FAMILY HISTORY:  Family History  Problem Relation Age of Onset   Other Mother        chronic diarrhea   COPD Mother    Heart disease Mother        died at 32   Arthritis Mother    Cancer Mother    Depression Mother    Diabetes Mother    Hyperlipidemia Mother    Hypertension Mother    Stroke Mother    Arthritis Father    Asthma Father    Birth defects Father    Heart disease Father        aortic valve replaced   Heart disease Maternal Grandmother    Stroke Maternal Grandmother    Heart disease Maternal Grandfather    Stroke Maternal Grandfather    Diabetes Paternal Grandmother    Stroke Paternal Grandfather    Heart disease Paternal Grandfather    Liver  disease Neg Hx    Colon cancer Neg Hx     CURRENT MEDICATIONS:  Outpatient Encounter Medications as  of 09/27/2024  Medication Sig Note   acetaminophen  (TYLENOL ) 325 MG tablet Take 650 mg by mouth every 8 (eight) hours as needed.    albuterol (VENTOLIN HFA) 108 (90 Base) MCG/ACT inhaler Inhale 2 puffs into the lungs every 6 (six) hours as needed for wheezing or shortness of breath.    ammonium lactate (LAC-HYDRIN) 12 % lotion Apply 1 Application topically daily. 01/19/2024: Reports not picked up yet from pharmacy   apixaban (ELIQUIS) 5 MG TABS tablet Take 5 mg by mouth 2 (two) times daily. 03/08/2024: Reports that he does not have this medication.  [facility] transplant team notified on 03/08/2024   aspirin  EC 81 MG tablet Take by mouth. 03/08/2024: On hold    Calcium  Citrate-Vitamin D  (CALCIUM  CITRATE + D3 PO) Take 2 tablets by mouth 2 (two) times daily.    carvedilol  (COREG ) 3.125 MG tablet Take 1 tablet (3.125 mg total) by mouth 2 (two) times daily with a meal.    Continuous Glucose Sensor (FREESTYLE LIBRE 2 SENSOR) MISC See admin instructions.    Copper  Gluconate 2 MG TABS Take 2 mg by mouth daily. 01/19/2024: Has not picked up yet   cyanocobalamin  (VITAMIN B12) 1000 MCG tablet Take 1 tablet (1,000 mcg total) by mouth daily.    diphenoxylate-atropine (LOMOTIL) 2.5-0.025 MG tablet Take 1 tablet by mouth 3 (three) times daily. 01/19/2024: Has not picked up yet   insulin  degludec (TRESIBA) 100 UNIT/ML FlexTouch Pen Inject 10 Units into the skin.    levothyroxine (SYNTHROID) 50 MCG tablet Take 50 mcg by mouth daily before breakfast.    Lido-Capsaicin-Men-Methyl Sal (MEDI-PATCH-LIDOCAINE  EX) Apply 1 patch topically as directed.    loperamide (IMODIUM) 2 MG capsule Take 2 mg by mouth.    MAGNESIUM-OXIDE PO Take 266 mg by mouth.    Multiple Vitamin (MULTIVITAMIN WITH MINERALS) TABS tablet Take 1 tablet by mouth daily. 03/08/2024: Reports no RX.  [facility] transplant team informed.    NON FORMULARY Tube feedings  from 6pm-8am  via tube feeding pump.    ondansetron  (ZOFRAN -ODT) 4 MG disintegrating tablet Take 4 mg by mouth every 8 (eight) hours as needed.    Pancrelipase, Lip-Prot-Amyl, 25000-79000 units CPEP Take 2 capsules by mouth.    pantoprazole  (PROTONIX ) 40 MG tablet Take 40 mg by mouth 2 (two) times daily.    sodium zirconium cyclosilicate  (LOKELMA ) 10 g PACK packet Take 10 g by mouth daily.    tacrolimus  (PROGRAF ) 1 MG capsule Take 2 mg by mouth 2 (two) times daily.    tamsulosin  (FLOMAX ) 0.4 MG CAPS capsule Take 0.4 mg by mouth daily.    UNABLE TO FIND Med Name: Magnesium oxide-magnesium amino acid chelate 266 mg BID    VITAMIN D -1000 MAX ST 25 MCG (1000 UT) tablet Take 1,000 Units by mouth.    No facility-administered encounter medications on file as of 09/27/2024.    ALLERGIES:  Allergies  Allergen Reactions   Penicillins Other (See Comments)    As a baby, unknown reaction Did it involve swelling of the face/tongue/throat, SOB, or low BP? Unknown Did it involve sudden or severe rash/hives, skin peeling, or any reaction on the inside of your mouth or nose? Unknown Did you need to seek medical attention at a hospital or doctor's office? Unknown When did it last happen?      infant allergy If all above answers are "NO", may proceed with cephalosporin use. .    Amlodipine Swelling   Gluten Meal Diarrhea   Biaxin [Clarithromycin] Tinitus  Ears ringing, loss sense of smell    LABORATORY DATA:  I have reviewed the labs as listed.  CBC    Component Value Date/Time   WBC 3.1 (L) 09/13/2024 1036   RBC 3.37 (L) 09/13/2024 1036   HGB 10.6 (L) 09/13/2024 1036   HGB 11.1 (L) 09/02/2021 1655   HCT 32.9 (L) 09/13/2024 1036   HCT 32.9 (L) 09/02/2021 1655   PLT 119 (L) 09/13/2024 1036   PLT 43 (LL) 09/02/2021 1655   MCV 97.6 09/13/2024 1036   MCV 96 09/02/2021 1655   MCH 31.5 09/13/2024 1036   MCHC 32.2 09/13/2024 1036   RDW 13.9 09/13/2024 1036   RDW 14.0 09/02/2021 1655    LYMPHSABS 1.4 09/13/2024 1036   LYMPHSABS 1.2 09/02/2021 1655   MONOABS 0.3 09/13/2024 1036   EOSABS 0.2 09/13/2024 1036   EOSABS 0.3 09/02/2021 1655   BASOSABS 0.0 09/13/2024 1036   BASOSABS 0.0 09/02/2021 1655      Latest Ref Rng & Units 09/13/2024   10:36 AM 08/15/2024    9:50 AM 07/05/2024    9:17 AM  CMP  Glucose 70 - 99 mg/dL 850  869  859   BUN 8 - 23 mg/dL 41  42  65   Creatinine 0.61 - 1.24 mg/dL 7.67  8.07  8.19   Sodium 135 - 145 mmol/L 142  140  138   Potassium 3.5 - 5.1 mmol/L 4.7  4.9  5.1   Chloride 98 - 111 mmol/L 110  109  104   CO2 22 - 32 mmol/L 24  25  23    Calcium  8.9 - 10.3 mg/dL 8.3  9.0  9.2   Total Protein 6.5 - 8.1 g/dL 6.0  6.3  7.0   Total Bilirubin 0.0 - 1.2 mg/dL 0.4  0.3  0.7   Alkaline Phos 38 - 126 U/L 117  138  115   AST 15 - 41 U/L 21  22  29    ALT 0 - 44 U/L 14  18  29      DIAGNOSTIC IMAGING:  I have independently reviewed the relevant imaging and discussed with the patient.   WRAP UP:  All questions were answered. The patient knows to call the clinic with any problems, questions or concerns.  Medical decision making: Moderate  Time spent on visit: I spent 20 minutes counseling the patient face to face. The total time spent in the appointment was 30 minutes and more than 50% was on counseling.  Pleasant CHRISTELLA Barefoot, PA-C  ***

## 2024-09-27 ENCOUNTER — Inpatient Hospital Stay

## 2024-09-27 ENCOUNTER — Inpatient Hospital Stay (HOSPITAL_BASED_OUTPATIENT_CLINIC_OR_DEPARTMENT_OTHER): Admitting: Physician Assistant

## 2024-09-27 DIAGNOSIS — E538 Deficiency of other specified B group vitamins: Secondary | ICD-10-CM

## 2024-09-27 DIAGNOSIS — N1832 Chronic kidney disease, stage 3b: Secondary | ICD-10-CM

## 2024-09-27 DIAGNOSIS — D631 Anemia in chronic kidney disease: Secondary | ICD-10-CM

## 2024-09-27 LAB — CBC
HCT: 35.2 % — ABNORMAL LOW (ref 39.0–52.0)
Hemoglobin: 11.5 g/dL — ABNORMAL LOW (ref 13.0–17.0)
MCH: 32 pg (ref 26.0–34.0)
MCHC: 32.7 g/dL (ref 30.0–36.0)
MCV: 98.1 fL (ref 80.0–100.0)
Platelets: 128 K/uL — ABNORMAL LOW (ref 150–400)
RBC: 3.59 MIL/uL — ABNORMAL LOW (ref 4.22–5.81)
RDW: 13.4 % (ref 11.5–15.5)
WBC: 3.6 K/uL — ABNORMAL LOW (ref 4.0–10.5)
nRBC: 0 % (ref 0.0–0.2)

## 2024-09-27 NOTE — Progress Notes (Signed)
 No Retacrit  today per providers order for Hgb of 11.5.

## 2024-09-27 NOTE — Patient Instructions (Signed)
 [facility] at [facility] **VISIT SUMMARY & IMPORTANT INSTRUCTIONS **   You were seen today by Pleasant Barefoot PA-C for your follow-up visit.   We will continue Retacrit  every 2 weeks. Take Vitamin B12 1,000 mcg every other day (or 500 mcg daily)  Your blood clot was provoked by hospitalizations and severe acute illness.  You have completed 6 months of treatment with blood thinner, and your labs did not show any underlying hypercoagulable disorder that makes you more likely to form blood clots.  It would be reasonable to discontinue anticoagulation (Eliquis) at this time.  However, he would still have a slightly increased risk of having another blood clot in the future.  If you had any future blood clots, you would require lifelong blood thinners.  FOLLOW-UP APPOINTMENT: 3 months   ** Thank you for trusting me with your healthcare!  I strive to provide all of my patients with quality care at each visit.  If you receive a survey for this visit, I would be so grateful to you for taking the time to provide feedback.  Thank you in advance!  ~ Mykelti Goldenstein                                        Dr. Mickiel Davonna Pleasant Barefoot, PA-C    Delon Hope, NP   - - - - - - - - - - - - - - - - - -     Thank you for choosing [facility] at [facility] to provide your oncology and hematology care.  To afford each patient quality time with our provider, please arrive at least 15 minutes before your scheduled appointment time.   If you have a lab appointment with the Cancer Center please come in thru the Main Entrance and check in at the main information desk.  You need to re-schedule your appointment should you arrive 10 or more minutes late.  We strive to give you quality time with our providers, and arriving late affects you and other patients whose appointments are after yours.  Also, if you no show three or more times for appointments you may be  dismissed from the clinic at the providers discretion.     Again, thank you for choosing [facility].  Our hope is that these requests will decrease the amount of time that you wait before being seen by our physicians.       _____________________________________________________________  Should you have questions after your visit to [facility], please contact our office at [phone] and follow the prompts.  Our office hours are 8:00 a.m. and 4:30 p.m. Monday - Friday.  Please note that voicemails left after 4:00 p.m. may not be returned until the following business day.  We are closed weekends and major holidays.  You do have access to a nurse 24-7, just call the main number to the clinic [phone] and do not press any options, hold on the line and a nurse will answer the phone.    For prescription refill requests, have your pharmacy contact our office and allow 72 hours.

## 2024-10-11 ENCOUNTER — Inpatient Hospital Stay

## 2024-10-11 ENCOUNTER — Other Ambulatory Visit (HOSPITAL_COMMUNITY)
Admission: RE | Admit: 2024-10-11 | Discharge: 2024-10-11 | Disposition: A | Discharge status: Home / Self Care | Source: Ambulatory Visit | Attending: Gastroenterology | Admitting: Gastroenterology

## 2024-10-11 ENCOUNTER — Inpatient Hospital Stay: Attending: Physician Assistant

## 2024-10-11 DIAGNOSIS — N1832 Chronic kidney disease, stage 3b: Secondary | ICD-10-CM | POA: Insufficient documentation

## 2024-10-11 DIAGNOSIS — D631 Anemia in chronic kidney disease: Secondary | ICD-10-CM | POA: Insufficient documentation

## 2024-10-11 DIAGNOSIS — Z944 Liver transplant status: Secondary | ICD-10-CM | POA: Insufficient documentation

## 2024-10-11 LAB — CBC WITH DIFFERENTIAL/PLATELET
Abs Immature Granulocytes: 0.01 K/uL (ref 0.00–0.07)
Basophils Absolute: 0 K/uL (ref 0.0–0.1)
Basophils Relative: 1 %
Eosinophils Absolute: 0.3 K/uL (ref 0.0–0.5)
Eosinophils Relative: 6 %
HCT: 34.2 % — ABNORMAL LOW (ref 39.0–52.0)
Hemoglobin: 11 g/dL — ABNORMAL LOW (ref 13.0–17.0)
Immature Granulocytes: 0 %
Lymphocytes Relative: 39 %
Lymphs Abs: 1.7 K/uL (ref 0.7–4.0)
MCH: 31.2 pg (ref 26.0–34.0)
MCHC: 32.2 g/dL (ref 30.0–36.0)
MCV: 96.9 fL (ref 80.0–100.0)
Monocytes Absolute: 0.4 K/uL (ref 0.1–1.0)
Monocytes Relative: 9 %
Neutro Abs: 2 K/uL (ref 1.7–7.7)
Neutrophils Relative %: 45 %
Platelets: 121 K/uL — ABNORMAL LOW (ref 150–400)
RBC: 3.53 MIL/uL — ABNORMAL LOW (ref 4.22–5.81)
RDW: 13.3 % (ref 11.5–15.5)
WBC: 4.4 K/uL (ref 4.0–10.5)
nRBC: 0 % (ref 0.0–0.2)

## 2024-10-11 LAB — COMPREHENSIVE METABOLIC PANEL WITH GFR
ALT: 16 U/L (ref 0–44)
AST: 21 U/L (ref 15–41)
Albumin: 3.5 g/dL (ref 3.5–5.0)
Alkaline Phosphatase: 154 U/L — ABNORMAL HIGH (ref 38–126)
Anion gap: 11 (ref 5–15)
BUN: 62 mg/dL — ABNORMAL HIGH (ref 8–23)
CO2: 20 mmol/L — ABNORMAL LOW (ref 22–32)
Calcium: 8.7 mg/dL — ABNORMAL LOW (ref 8.9–10.3)
Chloride: 109 mmol/L (ref 98–111)
Creatinine, Ser: 3.25 mg/dL — ABNORMAL HIGH (ref 0.61–1.24)
GFR, Estimated: 20 mL/min — ABNORMAL LOW (ref >60)
Glucose, Bld: 110 mg/dL — ABNORMAL HIGH (ref 70–99)
Potassium: 5.4 mmol/L — ABNORMAL HIGH (ref 3.5–5.1)
Sodium: 140 mmol/L (ref 135–145)
Total Bilirubin: 0.5 mg/dL (ref 0.0–1.2)
Total Protein: 6.4 g/dL — ABNORMAL LOW (ref 6.5–8.1)

## 2024-10-11 LAB — MAGNESIUM: Magnesium: 2.4 mg/dL (ref 1.7–2.4)

## 2024-10-11 NOTE — Progress Notes (Signed)
 No retacrit  today per parameters, hemoglobin 11.0, patient made aware and discharged in satisfactory condition.

## 2024-10-12 LAB — TACROLIMUS LEVEL: Tacrolimus (FK506) - LabCorp: 6 ng/mL (ref 5.0–20.0)

## 2024-10-23 ENCOUNTER — Other Ambulatory Visit (HOSPITAL_COMMUNITY)
Admission: RE | Admit: 2024-10-23 | Discharge: 2024-10-23 | Disposition: A | Discharge status: Home / Self Care | Source: Ambulatory Visit | Attending: Gastroenterology | Admitting: Gastroenterology

## 2024-10-23 DIAGNOSIS — E875 Hyperkalemia: Secondary | ICD-10-CM | POA: Insufficient documentation

## 2024-10-23 LAB — BASIC METABOLIC PANEL WITH GFR
Anion gap: 12 (ref 5–15)
BUN: 57 mg/dL — ABNORMAL HIGH (ref 8–23)
CO2: 24 mmol/L (ref 22–32)
Calcium: 8.7 mg/dL — ABNORMAL LOW (ref 8.9–10.3)
Chloride: 105 mmol/L (ref 98–111)
Creatinine, Ser: 3.19 mg/dL — ABNORMAL HIGH (ref 0.61–1.24)
GFR, Estimated: 21 mL/min — ABNORMAL LOW
Glucose, Bld: 81 mg/dL (ref 70–99)
Potassium: 4.7 mmol/L (ref 3.5–5.1)
Sodium: 141 mmol/L (ref 135–145)

## 2024-10-25 ENCOUNTER — Inpatient Hospital Stay

## 2024-10-25 VITALS — BP 137/63 | HR 69 | Temp 96.7°F | Resp 20

## 2024-10-25 DIAGNOSIS — D631 Anemia in chronic kidney disease: Secondary | ICD-10-CM | POA: Diagnosis not present

## 2024-10-25 DIAGNOSIS — N1832 Chronic kidney disease, stage 3b: Secondary | ICD-10-CM | POA: Diagnosis present

## 2024-10-25 LAB — CBC
HCT: 32.1 % — ABNORMAL LOW (ref 39.0–52.0)
Hemoglobin: 10.6 g/dL — ABNORMAL LOW (ref 13.0–17.0)
MCH: 31.7 pg (ref 26.0–34.0)
MCHC: 33 g/dL (ref 30.0–36.0)
MCV: 96.1 fL (ref 80.0–100.0)
Platelets: 146 K/uL — ABNORMAL LOW (ref 150–400)
RBC: 3.34 MIL/uL — ABNORMAL LOW (ref 4.22–5.81)
RDW: 13.4 % (ref 11.5–15.5)
WBC: 4.9 K/uL (ref 4.0–10.5)
nRBC: 0 % (ref 0.0–0.2)

## 2024-10-25 MED ORDER — EPOETIN ALFA-EPBX 20000 UNIT/ML IJ SOLN
20000.0000 [IU] | Freq: Once | INTRAMUSCULAR | Status: AC
  Administered 2024-10-25: 20000 [IU] via SUBCUTANEOUS
  Filled 2024-10-25: qty 1

## 2024-10-25 NOTE — Progress Notes (Signed)
 Michael Harmon presents today for Retacrit  injection per the provider's orders.  Hgb 10.6.  Stable during administration without incident; injection site WNL; see MAR for injection details.  Patient tolerated procedure well and without incident.  No questions or complaints noted at this time.

## 2024-10-25 NOTE — Patient Instructions (Signed)
 [facility] [place] [facility]  Discharge Instructions: Thank you for choosing [facility] to provide your oncology and hematology care.  If you have a lab appointment with the Cancer Center - please note that after April 8th, 2024, all labs will be drawn in the cancer center.  You do not have to check in or register with the main entrance as you have in the past but will complete your check-in in the cancer center.  Wear comfortable clothing and clothing appropriate for easy access to any Portacath or PICC line.   We strive to give you quality time with your provider. You may need to reschedule your appointment if you arrive late (15 or more minutes).  Arriving late affects you and other patients whose appointments are after yours.  Also, if you miss three or more appointments without notifying the office, you may be dismissed from the clinic at the provider's discretion.      For prescription refill requests, have your pharmacy contact our office and allow 72 hours for refills to be completed.    Today you received the following chemotherapy and/or immunotherapy agents Retacrit      To help prevent nausea and vomiting after your treatment, we encourage you to take your nausea medication as directed.  BELOW ARE SYMPTOMS THAT SHOULD BE REPORTED IMMEDIATELY: *FEVER GREATER THAN 100.4 F (38 C) OR HIGHER *CHILLS OR SWEATING *NAUSEA AND VOMITING THAT IS NOT CONTROLLED WITH YOUR NAUSEA MEDICATION *UNUSUAL SHORTNESS OF BREATH *UNUSUAL BRUISING OR BLEEDING *URINARY PROBLEMS (pain or burning when urinating, or frequent urination) *BOWEL PROBLEMS (unusual diarrhea, constipation, pain near the anus) TENDERNESS IN MOUTH AND THROAT WITH OR WITHOUT PRESENCE OF ULCERS (sore throat, sores in mouth, or a toothache) UNUSUAL RASH, SWELLING OR PAIN  UNUSUAL VAGINAL DISCHARGE OR ITCHING   Items with * indicate a potential emergency and should be followed up  as soon as possible or go to the Emergency Department if any problems should occur.  Please show the CHEMOTHERAPY ALERT CARD or IMMUNOTHERAPY ALERT CARD at check-in to the Emergency Department and triage nurse.  Should you have questions after your visit or need to cancel or reschedule your appointment, please contact [facility] [place] - A DEPT OF Eligha Bridegroom [facility] [phone]  and follow the prompts.  Office hours are 8:00 a.m. to 4:30 p.m. Monday - Friday. Please note that voicemails left after 4:00 p.m. may not be returned until the following business day.  We are closed weekends and major holidays. You have access to a nurse at all times for urgent questions. Please call the main number to the clinic [phone] and follow the prompts.  For any non-urgent questions, you may also contact your provider using MyChart. We now offer e-Visits for anyone 90 and older to request care online for non-urgent symptoms. For details visit [email or web address].   Also download the MyChart app! Go to the app store, search "MyChart", open the app, select [place], and log in with your MyChart username and password.

## 2024-10-30 ENCOUNTER — Encounter: Payer: Self-pay | Admitting: *Deleted

## 2024-11-08 ENCOUNTER — Inpatient Hospital Stay

## 2024-11-08 ENCOUNTER — Inpatient Hospital Stay: Attending: Physician Assistant

## 2024-11-08 VITALS — BP 158/65 | HR 63 | Temp 96.6°F | Resp 18

## 2024-11-08 DIAGNOSIS — N1832 Chronic kidney disease, stage 3b: Secondary | ICD-10-CM

## 2024-11-08 DIAGNOSIS — D631 Anemia in chronic kidney disease: Secondary | ICD-10-CM | POA: Diagnosis not present

## 2024-11-08 LAB — CBC
HCT: 31.9 % — ABNORMAL LOW (ref 39.0–52.0)
Hemoglobin: 10.3 g/dL — ABNORMAL LOW (ref 13.0–17.0)
MCH: 32.3 pg (ref 26.0–34.0)
MCHC: 32.3 g/dL (ref 30.0–36.0)
MCV: 100 fL (ref 80.0–100.0)
Platelets: 109 K/uL — ABNORMAL LOW (ref 150–400)
RBC: 3.19 MIL/uL — ABNORMAL LOW (ref 4.22–5.81)
RDW: 14.2 % (ref 11.5–15.5)
WBC: 3.6 K/uL — ABNORMAL LOW (ref 4.0–10.5)
nRBC: 0 % (ref 0.0–0.2)

## 2024-11-08 MED ORDER — EPOETIN ALFA-EPBX 20000 UNIT/ML IJ SOLN
20000.0000 [IU] | Freq: Once | INTRAMUSCULAR | Status: AC
  Administered 2024-11-08: 20000 [IU] via SUBCUTANEOUS
  Filled 2024-11-08: qty 1

## 2024-11-08 NOTE — Patient Instructions (Signed)

## 2024-11-08 NOTE — Progress Notes (Signed)
Patient presents today for Retacrit injection. Hemoglobin reviewed prior to administration. VSS tolerated without incident or complaint. See MAR for details. Patient stable during and after injection. Patient discharged in satisfactory condition with no s/s of distress noted.  

## 2024-11-14 ENCOUNTER — Telehealth: Payer: Self-pay

## 2024-11-14 NOTE — Telephone Encounter (Signed)
 Copied from CRM [id number]. Topic: Appointments - Scheduling Inquiry for Clinic >> Nov 14, 2024 12:37 PM Rosaria E wrote: Reason for CRM: Pt called needing to schedule a hosp fu, nothing available soon enough. Please advise   Best contact: [phone]

## 2024-11-15 ENCOUNTER — Encounter: Payer: Self-pay | Admitting: Internal Medicine

## 2024-11-15 ENCOUNTER — Ambulatory Visit: Payer: Self-pay | Admitting: Internal Medicine

## 2024-11-15 VITALS — BP 128/72 | HR 72 | Ht 67.0 in | Wt 199.8 lb

## 2024-11-15 DIAGNOSIS — N184 Chronic kidney disease, stage 4 (severe): Secondary | ICD-10-CM | POA: Diagnosis not present

## 2024-11-15 DIAGNOSIS — T148XXA Other injury of unspecified body region, initial encounter: Secondary | ICD-10-CM | POA: Insufficient documentation

## 2024-11-15 DIAGNOSIS — Z944 Liver transplant status: Secondary | ICD-10-CM | POA: Diagnosis not present

## 2024-11-15 DIAGNOSIS — I10 Essential (primary) hypertension: Secondary | ICD-10-CM | POA: Diagnosis not present

## 2024-11-15 DIAGNOSIS — Z794 Long term (current) use of insulin: Secondary | ICD-10-CM

## 2024-11-15 DIAGNOSIS — R233 Spontaneous ecchymoses: Secondary | ICD-10-CM

## 2024-11-15 DIAGNOSIS — K7460 Unspecified cirrhosis of liver: Secondary | ICD-10-CM | POA: Diagnosis not present

## 2024-11-15 DIAGNOSIS — I2699 Other pulmonary embolism without acute cor pulmonale: Secondary | ICD-10-CM | POA: Diagnosis not present

## 2024-11-15 DIAGNOSIS — E8809 Other disorders of plasma-protein metabolism, not elsewhere classified: Secondary | ICD-10-CM

## 2024-11-15 DIAGNOSIS — Z8673 Personal history of transient ischemic attack (TIA), and cerebral infarction without residual deficits: Secondary | ICD-10-CM

## 2024-11-15 DIAGNOSIS — R29898 Other symptoms and signs involving the musculoskeletal system: Secondary | ICD-10-CM | POA: Diagnosis not present

## 2024-11-15 DIAGNOSIS — Z09 Encounter for follow-up examination after completed treatment for conditions other than malignant neoplasm: Secondary | ICD-10-CM

## 2024-11-15 NOTE — Assessment & Plan Note (Signed)
S/p liver transplant now Followed by [facility] and [facility] liver clinic Has esophageal varices and pancytopenia Has had Hep A and B vaccines Liver enzymes stable overall

## 2024-11-15 NOTE — Assessment & Plan Note (Signed)
 Has chronic leg swelling due to hypoalbuminemia, history of liver cirrhosis Unable to take regular protein supplement due to hyperkalemia -p  previously had hospitalization for hyperkalemia Has been advised to take Nepro supplement, but does not tolerate it, has nausea from it Continue high protein diet

## 2024-11-15 NOTE — Patient Instructions (Signed)
 Please take Aspirin  only for 3 weeks, and then continue only Eliquis.  Please continue taking Coreg  as prescribed.  Please continue taking Atorvastatin .

## 2024-11-15 NOTE — Assessment & Plan Note (Signed)
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

## 2024-11-15 NOTE — Assessment & Plan Note (Signed)
 Will try to obtain hospital chart including imaging Medications reconciled and reviewed with the patient in detail

## 2024-11-15 NOTE — Assessment & Plan Note (Signed)
 Noted in 03/25 On Eliquis 5 mg BID -followed by hematology at [facility]

## 2024-11-15 NOTE — Assessment & Plan Note (Signed)
 BP Readings from Last 1 Encounters:  11/15/24 128/72   Well-controlled now On carvedilol  6.25 mg QAM and 3.125 mg QPM - follow up with Nephrology Did not tolerate amlodipine, unable to start ACEi/ARB or diuretic due to CKD stage 4 currently Advised to follow low-salt diet and ambulate as tolerated

## 2024-11-15 NOTE — Progress Notes (Signed)
 "  Established Patient Office Visit  Subjective:  Patient ID: Michael Harmon, male    DOB: 19-Oct-1961  Age: 64 y.o. MRN: [id number]  CC:  Chief Complaint  Patient presents with   Hospitalization Follow-up    Hospital follow up , feeling weak.    HPI Michael Harmon is a 64 y.o. male with past medical history of HTN, hepatic cirrhosis, portal hypertension, esophageal varices, celiac disease, type II DM, HLD, thrombocytopenia and IDA who presents for recent hospitalization at [facility] in [place] from 11/11/24-11/13/24.  He reports feeling left-sided numbness and weakness on the day of ER visit.  Of note, he reports doing some exertional work in his yard on that day.  His BP was also elevated on that day, up to 180s/90s.  He was worried about intracranial hemorrhage, and went to ER.  His initial CT of head was negative for intracranial bleeding.  He was admitted for stroke workup.  He had MRI of brain on 11/13/24, and was told of stroke, but MRI report is not available today for review.  He has been placed on aspirin  81 mg QD and atorvastatin  40 mg QD.  Reports mild improvement in the LUE weakness, but still reports LLE weakness.  Of note, he currently takes Eliquis 5 mg twice daily due to history of pulmonary embolism.   CKD: He follows up with [facility] neph[facility], and is in the transplant list as well.  Last calculated GFR was 21.  He recently saw transplant clinic at [facility] as well.  HTN: His BP has been elevated for the last few months, but is better since starting Coreg .  He was placed on amlodipine by nephrology, but had leg pain with it.  Denies any chest pain, dyspnea or palpitations currently.    Past Medical History:  Diagnosis Date   Allergy    nose and sinus problems   Anemia    Anemia in chronic kidney disease (CKD) 11/23/2022   Asthma    Celiac disease    Cirrhosis, cryptogenic (HCC)    completed Hep A and B vaccines   DM (diabetes mellitus) (HCC)    Encounter for  general adult medical examination with abnormal findings 09/08/2022   GERD (gastroesophageal reflux disease)    Hyperlipidemia    diet controlled now with 100 pound weight loss   Hypertension    Hypothyroidism 03/17/2024   Iron  deficiency anemia due to chronic blood loss 08/13/2021   Malignant neoplasm of prostate (HCC) 10/03/2021   Murmur, heart 03/02/2022   Narrow angle glaucoma suspect of both eyes    Renal cyst 09/03/2017   right   Situational depression 07/06/2017   Splenomegaly    Thrombocytopenia    hematology, ?related to chol med (tricor) and glimeripide, just being monitored, above 100,000    Past Surgical History:  Procedure Laterality Date   BIOPSY  07/09/2016   Procedure: BIOPSY;  Surgeon: Lamar CHRISTELLA Hollingshead, MD;  [facility];  Service: Endoscopy;;  duodenal, gastric, esophaageal   BIOPSY  07/31/2021   Procedure: BIOPSY;  Surgeon: Hollingshead Lamar CHRISTELLA, MD;  [facility];  Service: Endoscopy;;   CLOSED MANIPULATION SHOULDER     COLONOSCOPY  11/2015   Dr. Tobie: cecal bx neg for microscopic colitis. ascending colon polyp (adenomatous)   COLONOSCOPY WITH PROPOFOL  N/A 07/31/2021   Procedure: COLONOSCOPY WITH PROPOFOL ;  Surgeon: Hollingshead Lamar CHRISTELLA, MD;  [facility];  Service: Endoscopy;  Laterality: N/A;  7:30am   ESOPHAGEAL BANDING  N/A 08/04/2017   Procedure: ESOPHAGEAL BANDING;  Surgeon: Shaaron Lamar HERO, MD;  [facility];  Service: Endoscopy;  Laterality: N/A;  esophageal varices banding   ESOPHAGEAL BANDING N/A 10/13/2017   Procedure: ESOPHAGEAL BANDING;  Surgeon: Shaaron Lamar HERO, MD;  [facility];  Service: Endoscopy;  Laterality: N/A;   ESOPHAGEAL BANDING N/A 04/09/2020   Procedure: ESOPHAGEAL BANDING;  Surgeon: Shaaron Lamar HERO, MD;  [facility];  Service: Endoscopy;  Laterality: N/A;   ESOPHAGOGASTRODUODENOSCOPY N/A 07/09/2016   Procedure: ESOPHAGOGASTRODUODENOSCOPY (EGD);  Surgeon: Lamar HERO Shaaron, MD;  [facility];  Service: Endoscopy;  Laterality: N/A;  730   ESOPHAGOGASTRODUODENOSCOPY N/A 08/04/2017   Procedure: ESOPHAGOGASTRODUODENOSCOPY (EGD);  Surgeon: Shaaron Lamar HERO, MD;  [facility];  Service: Endoscopy;  Laterality: N/A;  7:30am   ESOPHAGOGASTRODUODENOSCOPY N/A 10/13/2017   Dr. Shaaron: grade 2 esophageal varices status post banding, portal hypertensive gastropathy   ESOPHAGOGASTRODUODENOSCOPY  11/23/2019   [name]: Esophageal varices, 4 columns of grade 2-3 status post banding.  Varices more prominent than seen in December 2018.  Portal gastropathy.  Normal-appearing small bowel.   ESOPHAGOGASTRODUODENOSCOPY (EGD) WITH PROPOFOL  N/A 04/09/2020   Dr. Shaaron: 4 columns of grade 2/grade 3 esophageal varices somewhat recalcitrant.  Status post esophageal band ligation today.  Portal gastropathy.  Normal-appearing small bowel.   ESOPHAGOGASTRODUODENOSCOPY (EGD) WITH PROPOFOL  N/A 07/31/2021   Procedure: ESOPHAGOGASTRODUODENOSCOPY (EGD) WITH PROPOFOL ;  Surgeon: Shaaron Lamar HERO, MD;  [facility];  Service: Endoscopy;  Laterality: N/A;   GOLD SEED IMPLANT N/A 01/15/2022   Procedure: GOLD SEED IMPLANT;  Surgeon: Sherrilee Belvie CROME, MD;  Location: [facility];  Service: Urology;  Laterality: N/A;   LIVER TRANSPLANT  01/16/2023   SPACE OAR INSTILLATION N/A 01/15/2022   Procedure: SPACE OAR INSTILLATION;  Surgeon: Sherrilee Belvie CROME, MD;  Location: [facility];  Service: Urology;  Laterality: N/A;   US  PARACENTESIS  06/10/2022    Family History  Problem Relation Age of Onset   Other Mother        chronic diarrhea   COPD Mother    Heart disease Mother        died at 68   Arthritis Mother    Cancer Mother    Depression Mother    Diabetes Mother    Hyperlipidemia Mother    Hypertension Mother    Stroke Mother    Arthritis Father    Asthma Father    Birth defects Father    Heart disease Father        aortic valve replaced   Heart disease Maternal Grandmother    Stroke Maternal  Grandmother    Heart disease Maternal Grandfather    Stroke Maternal Grandfather    Diabetes Paternal Grandmother    Stroke Paternal Grandfather    Heart disease Paternal Grandfather    Liver disease Neg Hx    Colon cancer Neg Hx     Social History   Socioeconomic History   Marital status: Married    Spouse name: Melissa   Number of children: 0   Years of education: 14   Highest education level: Tax Adviser degree: occupational, scientist, product/process development, or vocational program  Occupational History   Occupation: lexicographer, 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   Has no children.   Eats all food groups.   Works for [employer]       Social Drivers of Health   Tobacco Use: Low Risk (11/15/2024)   Patient History    Smoking Tobacco Use: Never    Smokeless Tobacco Use: Never    Passive Exposure: Not on file  Financial Resource Strain: Low Risk (09/15/2024)   Overall Financial Resource Strain (CARDIA)    Difficulty of Paying Living Expenses: Not hard at all  Food Insecurity: No Food Insecurity (09/15/2024)   Epic    Worried About Programme Researcher, Broadcasting/film/video in the Last Year: Never true    Ran Out of Food in the Last Year: Never true  Transportation Needs: No Transportation Needs (09/15/2024)   Epic    Lack of Transportation (Medical): No    Lack of Transportation (Non-Medical): No  Physical Activity: Insufficiently Active (09/15/2024)   Exercise Vital Sign    Days of Exercise per Week: 2 days    Minutes of Exercise per Session: 40 min  Stress: No Stress Concern Present (09/15/2024)   [employer] of Occupational Health - Occupational  Stress Questionnaire    Feeling of Stress: Only a little  Social Connections: Socially Integrated (09/15/2024)   Social Connection and Isolation Panel    Frequency of Communication with Friends and Family: More than three times a week    Frequency of Social Gatherings with Friends and Family: Twice a week    Attends Religious Services: More than 4 times per year    Active Member of [employer] or Organizations: Yes    Attends Engineer, Structural: More than 4 times per year    Marital Status: Married  Catering Manager Violence: Patient Unable To Answer (03/08/2024)   Humiliation, Afraid, Rape, and Kick questionnaire    Fear of Current or Ex-Partner: Patient unable to answer    Emotionally Abused: Patient unable to answer    Physically Abused: Patient unable to answer    Sexually Abused: Patient unable to answer  Depression (PHQ2-9): Low Risk (11/15/2024)   Depression (PHQ2-9)    PHQ-2 Score: 3  Alcohol Screen: Not on file  Housing: Unknown (09/15/2024)   Epic    Unable to Pay for Housing in the Last Year: No    Number of Times Moved in the Last Year: Not on file    Homeless in the Last Year: No  Utilities: Not At Risk (03/08/2024)   [facility] Utilities    Threatened with loss of utilities: No  Health Literacy: Not on file    Outpatient Medications Prior to Visit  Medication Sig Dispense Refill   acetaminophen  (TYLENOL ) 325 MG tablet Take 650 mg by mouth every 8 (eight) hours as needed.     albuterol (VENTOLIN HFA) 108 (90 Base) MCG/ACT inhaler Inhale 2 puffs into the lungs every 6 (six) hours as needed for wheezing or shortness of breath.     ammonium lactate (LAC-HYDRIN) 12 % lotion Apply 1 Application topically daily.     apixaban (ELIQUIS) 5 MG TABS tablet Take 5 mg by mouth 2 (two) times daily.     aspirin  EC 81 MG tablet Take by mouth.     bumetanide (BUMEX) 1 MG tablet Take 2 mg by mouth daily.     Calcium  Citrate-Vitamin  D (CALCIUM  CITRATE + D3 PO) Take 2 tablets by mouth 2  (two) times daily.     Continuous Glucose Sensor (FREESTYLE LIBRE 2 SENSOR) MISC See admin instructions.     Copper  Gluconate 2 MG TABS Take 2 mg by mouth daily.     cyanocobalamin  (VITAMIN B12) 1000 MCG tablet Take 1 tablet (1,000 mcg total) by mouth daily. 90 tablet 3   diphenoxylate-atropine (LOMOTIL) 2.5-0.025 MG tablet Take 1 tablet by mouth 3 (three) times daily.     insulin  degludec (TRESIBA) 100 UNIT/ML FlexTouch Pen Inject 10 Units into the skin.     levothyroxine (SYNTHROID) 50 MCG tablet Take 50 mcg by mouth daily before breakfast.     Lido-Capsaicin-Men-Methyl Sal (MEDI-PATCH-LIDOCAINE  EX) Apply 1 patch topically as directed.     loperamide (IMODIUM) 2 MG capsule Take 2 mg by mouth.     MAGNESIUM-OXIDE PO Take 266 mg by mouth.     Multiple Vitamin (MULTIVITAMIN WITH MINERALS) TABS tablet Take 1 tablet by mouth daily.     NON FORMULARY Tube feedings from 6pm-8am  via tube feeding pump.     ondansetron  (ZOFRAN -ODT) 4 MG disintegrating tablet Take 4 mg by mouth every 8 (eight) hours as needed.     Pancrelipase, Lip-Prot-Amyl, 25000-79000 units CPEP Take 2 capsules by mouth.     pantoprazole  (PROTONIX ) 40 MG tablet Take 40 mg by mouth 2 (two) times daily.     sodium zirconium cyclosilicate  (LOKELMA ) 10 g PACK packet Take 10 g by mouth daily.     tacrolimus  (PROGRAF ) 1 MG capsule Take 2 mg by mouth 2 (two) times daily.     tamsulosin  (FLOMAX ) 0.4 MG CAPS capsule Take 0.4 mg by mouth daily.     UNABLE TO FIND Med Name: Magnesium oxide-magnesium amino acid chelate 266 mg BID     VITAMIN D -1000 MAX ST 25 MCG (1000 UT) tablet Take 1,000 Units by mouth.     carvedilol  (COREG ) 3.125 MG tablet Take 1 tablet (3.125 mg total) by mouth 2 (two) times daily with a meal. 60 tablet 3   atorvastatin  (LIPITOR) 40 MG tablet Take 40 mg by mouth daily.     carvedilol  (COREG ) 3.125 MG tablet Take 3.125 mg by mouth. Take 6.25 mg in the morning and 3.125 mg in the evening.     No facility-administered  medications prior to visit.    Allergies  Allergen Reactions   Penicillins Other (See Comments)    As a baby, unknown reaction Did it involve swelling of the face/tongue/throat, SOB, or low BP? Unknown Did it involve sudden or severe rash/hives, skin peeling, or any reaction on the inside of your mouth or nose? Unknown Did you need to seek medical attention at a hospital or doctor's office? Unknown When did it last happen?      infant allergy If all above answers are NO, may proceed with cephalosporin use. .    Amlodipine Swelling   Gluten Meal Diarrhea   Biaxin [Clarithromycin] Tinitus    Ears ringing, loss sense of smell    ROS Review of Systems  Constitutional:  Positive for fatigue. Negative for chills and fever.  HENT:  Negative for congestion and sore throat.   Eyes:  Negative for pain and discharge.  Respiratory:  Negative for cough and shortness of breath.   Cardiovascular:  Positive for leg swelling. Negative for chest pain and palpitations.  Gastrointestinal:  Positive for diarrhea. Negative for nausea and vomiting.  Endocrine: Negative for polydipsia and polyuria.  Genitourinary:  Negative for dysuria and hematuria.  Musculoskeletal:  Negative for neck pain and neck stiffness.  Skin:  Negative for rash.  Neurological:  Negative for dizziness, weakness, numbness and headaches.  Hematological:  Bruises/bleeds easily.  Psychiatric/Behavioral:  Positive for sleep disturbance. Negative for agitation and behavioral problems.       Objective:    Physical Exam Vitals reviewed.  Constitutional:      General: He is not in acute distress.    Appearance: He is not diaphoretic.  HENT:     Head: Normocephalic and atraumatic.     Nose: Nose normal.     Mouth/Throat:     Mouth: Mucous membranes are moist.  Eyes:     General: No scleral icterus.    Extraocular Movements: Extraocular movements intact.  Cardiovascular:     Rate and Rhythm: Normal rate and regular  rhythm.     Heart sounds: Normal heart sounds. No murmur heard. Pulmonary:     Breath sounds: Normal breath sounds. No wheezing or rales.  Musculoskeletal:     Cervical back: Neck supple. No tenderness.     Right lower leg: Edema (1+) present.     Left lower leg: Edema (2+) present.  Feet:     Right foot:     Skin integrity: Callus present.     Left foot:     Skin integrity: Callus present.  Skin:    General: Skin is warm.     Findings: Bruising (B/l forearms near elbows) present. No rash.  Neurological:     General: No focal deficit present.     Mental Status: He is alert and oriented to person, place, and time.     Cranial Nerves: No cranial nerve deficit.     Sensory: No sensory deficit.     Motor: Weakness (B/l LE - 4/5) present.  Psychiatric:        Mood and Affect: Mood normal.        Behavior: Behavior normal.     BP 128/72 (BP Location: Right Arm)   Pulse 72   Ht 5' 7 (1.702 m)   Wt 199 lb 12.8 oz (90.6 kg)   SpO2 98%   BMI 31.29 kg/m  Wt Readings from Last 3 Encounters:  11/15/24 199 lb 12.8 oz (90.6 kg)  09/27/24 201 lb (91.2 kg)  09/19/24 200 lb 12.8 oz (91.1 kg)    Lab Results  Component Value Date   TSH 4.656 (H) 07/05/2024   Lab Results  Component Value Date   WBC 3.6 (L) 11/08/2024   HGB 10.3 (L) 11/08/2024   HCT 31.9 (L) 11/08/2024   MCV 100.0 11/08/2024   PLT 109 (L) 11/08/2024   Lab Results  Component Value Date   NA 141 10/23/2024   K 4.7 10/23/2024   CO2 24 10/23/2024   GLUCOSE 81 10/23/2024   BUN 57 (H) 10/23/2024   CREATININE 3.19 (H) 10/23/2024   BILITOT 0.5 10/11/2024   ALKPHOS 154 (H) 10/11/2024   AST 21 10/11/2024   ALT 16 10/11/2024   PROT 6.4 (L) 10/11/2024   ALBUMIN  3.5 10/11/2024   CALCIUM  8.7 (L) 10/23/2024   ANIONGAP 12 10/23/2024   Lab Results  Component Value Date   CHOL 114 07/05/2024   Lab Results  Component Value Date   HDL 39 (L) 07/05/2024   Lab Results  Component Value Date   LDLCALC 56 07/05/2024    Lab Results  Component Value Date   TRIG 96 07/05/2024  Lab Results  Component Value Date   CHOLHDL 2.9 07/05/2024   Lab Results  Component Value Date   HGBA1C 5.2 07/05/2024      Assessment & Plan:   Problem List Items Addressed This Visit       Cardiovascular and Mediastinum   Essential hypertension   BP Readings from Last 1 Encounters:  11/15/24 128/72   Well-controlled now On carvedilol  6.25 mg QAM and 3.125 mg QPM - follow up with Nephrology Did not tolerate amlodipine, unable to start ACEi/ARB or diuretic due to CKD stage 4 currently Advised to follow low-salt diet and ambulate as tolerated      Relevant Medications   bumetanide (BUMEX) 1 MG tablet   atorvastatin  (LIPITOR) 40 MG tablet   Pulmonary embolus (HCC)   Noted in 03/25 On Eliquis 5 mg BID -followed by hematology at [facility]      Relevant Medications   bumetanide (BUMEX) 1 MG tablet   atorvastatin  (LIPITOR) 40 MG tablet     Digestive   Hepatic cirrhosis (HCC)   S/p liver transplant now Followed by [facility] and [facility] clinic Has esophageal varices and pancytopenia Has had Hep A and B vaccines Liver enzymes stable overall        Nervous and Auditory   Weakness of left lower extremity   Unclear if related to  recent exertional activity Due to recent hospitalization for possible stroke, will refer to PT      Relevant Orders   Ambulatory referral to Physical Therapy     Genitourinary   Chronic kidney disease, stage 4 (severe) (HCC)   Last CMP showed GFR of 21 - in Care Everywhere Followed by Nephrology at [facility] -undergoing evaluation for renal transplant Avoid nephrotoxic agents        Other   S/P liver transplant (HCC) (Chronic)   On 01/16/23 for 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 Followed by [facility] transplant team      Edema due to hypoalbuminemia   Has chronic leg swelling due to hypoalbuminemia, history of liver cirrhosis Unable to take regular protein supplement due  to hyperkalemia -p  previously had hospitalization for hyperkalemia Has been advised to take Nepro supplement, but does not tolerate it, has nausea from it Continue high protein diet      Hospital discharge follow-up   Will try to obtain hospital chart including imaging Medications reconciled and reviewed with the patient in detail      History of CVA (cerebrovascular accident) - Primary   Will obtain recent hospitalization record Unclear if acute versus subacute CVA Advised to take aspirin  81 mg QD only for 3 weeks, considering he is already taking Eliquis 5 mg BID and has history of easy bruising Continue atorvastatin  40 mg QD Has LLE weakness, referred to PT in [place] Referred to neurology in [place] - Dr. Hope as per patient preference      Relevant Orders   Ambulatory referral to Neurology   Ambulatory referral to Physical Therapy   Bruising   Has bilateral forearm bruising, appears to be well-healing Currently on Eliquis and aspirin         No orders of the defined types were placed in this encounter.   Follow-up: Return if symptoms worsen or fail to improve.    Suzzane MARLA Blanch, MD "

## 2024-11-15 NOTE — Assessment & Plan Note (Signed)
 Has bilateral forearm bruising, appears to be well-healing Currently on Eliquis and aspirin 

## 2024-11-15 NOTE — Assessment & Plan Note (Signed)
 Unclear if related to  recent exertional activity Due to recent hospitalization for possible stroke, will refer to PT

## 2024-11-15 NOTE — Assessment & Plan Note (Signed)
 Last CMP showed GFR of 21 - in Care Everywhere Followed by Nephrology at [facility] -undergoing evaluation for renal transplant Avoid nephrotoxic agents

## 2024-11-15 NOTE — Assessment & Plan Note (Addendum)
 Will obtain recent hospitalization record Unclear if acute versus subacute CVA Advised to take aspirin  81 mg QD only for 3 weeks, considering he is already taking Eliquis 5 mg BID and has history of easy bruising Continue atorvastatin  40 mg QD Has LLE weakness, referred to PT in [place] Referred to neurology in [place] - Dr. Hope as per patient preference

## 2024-11-22 ENCOUNTER — Inpatient Hospital Stay

## 2024-11-22 VITALS — BP 167/76 | HR 61 | Temp 96.8°F | Resp 20

## 2024-11-22 DIAGNOSIS — N1832 Chronic kidney disease, stage 3b: Secondary | ICD-10-CM

## 2024-11-22 LAB — CBC
HCT: 33.1 % — ABNORMAL LOW (ref 39.0–52.0)
Hemoglobin: 10.6 g/dL — ABNORMAL LOW (ref 13.0–17.0)
MCH: 31.8 pg (ref 26.0–34.0)
MCHC: 32 g/dL (ref 30.0–36.0)
MCV: 99.4 fL (ref 80.0–100.0)
Platelets: 132 K/uL — ABNORMAL LOW (ref 150–400)
RBC: 3.33 MIL/uL — ABNORMAL LOW (ref 4.22–5.81)
RDW: 13.6 % (ref 11.5–15.5)
WBC: 3.3 K/uL — ABNORMAL LOW (ref 4.0–10.5)
nRBC: 0 % (ref 0.0–0.2)

## 2024-11-22 MED ORDER — EPOETIN ALFA-EPBX 20000 UNIT/ML IJ SOLN
20000.0000 [IU] | Freq: Once | INTRAMUSCULAR | Status: AC
  Administered 2024-11-22: 20000 [IU] via SUBCUTANEOUS
  Filled 2024-11-22: qty 1

## 2024-11-22 NOTE — Progress Notes (Signed)
 Patient's Hgb 10.6 and blood pressure stable. Patient tolerated Retacrit injection with no complaints voiced.  Site clean and dry with no bruising or swelling noted at site.  See MAR for details.  Band aid applied.  Patient stable during and after injection.  Vss with discharge and left in satisfactory condition with no s/s of distress noted. All follow ups as scheduled.   Tijuan Dantes [employer]

## 2024-12-05 ENCOUNTER — Inpatient Hospital Stay

## 2024-12-06 ENCOUNTER — Inpatient Hospital Stay

## 2024-12-07 ENCOUNTER — Inpatient Hospital Stay

## 2024-12-07 ENCOUNTER — Inpatient Hospital Stay: Attending: Physician Assistant

## 2024-12-20 ENCOUNTER — Inpatient Hospital Stay

## 2024-12-20 ENCOUNTER — Inpatient Hospital Stay: Attending: Physician Assistant

## 2025-01-03 ENCOUNTER — Inpatient Hospital Stay

## 2025-01-03 ENCOUNTER — Inpatient Hospital Stay: Attending: Physician Assistant | Admitting: Physician Assistant

## 2025-01-19 ENCOUNTER — Ambulatory Visit: Admitting: Urology

## 2025-03-19 ENCOUNTER — Ambulatory Visit: Admitting: Internal Medicine
